# Patient Record
Sex: Female | Born: 1955
Health system: Southern US, Community
[De-identification: ages and names within clinical notes are randomized; demographics above are authoritative.]

## PROBLEM LIST (undated history)

## (undated) DIAGNOSIS — Z87442 Personal history of urinary calculi: Secondary | ICD-10-CM

## (undated) DIAGNOSIS — I10 Essential (primary) hypertension: Secondary | ICD-10-CM

## (undated) DIAGNOSIS — R768 Other specified abnormal immunological findings in serum: Secondary | ICD-10-CM

## (undated) DIAGNOSIS — D61818 Other pancytopenia: Principal | ICD-10-CM

## (undated) DIAGNOSIS — J45909 Unspecified asthma, uncomplicated: Secondary | ICD-10-CM

## (undated) DIAGNOSIS — M545 Low back pain, unspecified: Secondary | ICD-10-CM

## (undated) DIAGNOSIS — IMO0001 Reserved for inherently not codable concepts without codable children: Secondary | ICD-10-CM

## (undated) DIAGNOSIS — K219 Gastro-esophageal reflux disease without esophagitis: Secondary | ICD-10-CM

## (undated) DIAGNOSIS — I8500 Esophageal varices without bleeding: Secondary | ICD-10-CM

## (undated) DIAGNOSIS — K7460 Unspecified cirrhosis of liver: Secondary | ICD-10-CM

## (undated) DIAGNOSIS — Z5189 Encounter for other specified aftercare: Secondary | ICD-10-CM

## (undated) DIAGNOSIS — R0600 Dyspnea, unspecified: Secondary | ICD-10-CM

## (undated) DIAGNOSIS — D759 Disease of blood and blood-forming organs, unspecified: Secondary | ICD-10-CM

## (undated) DIAGNOSIS — K8020 Calculus of gallbladder without cholecystitis without obstruction: Secondary | ICD-10-CM

## (undated) DIAGNOSIS — M51369 Other intervertebral disc degeneration, lumbar region without mention of lumbar back pain or lower extremity pain: Secondary | ICD-10-CM

## (undated) DIAGNOSIS — F419 Anxiety disorder, unspecified: Secondary | ICD-10-CM

## (undated) DIAGNOSIS — I509 Heart failure, unspecified: Secondary | ICD-10-CM

## (undated) DIAGNOSIS — IMO0002 Reserved for concepts with insufficient information to code with codable children: Secondary | ICD-10-CM

## (undated) DIAGNOSIS — N189 Chronic kidney disease, unspecified: Secondary | ICD-10-CM

## (undated) DIAGNOSIS — F1021 Alcohol dependence, in remission: Secondary | ICD-10-CM

## (undated) DIAGNOSIS — M79605 Pain in left leg: Secondary | ICD-10-CM

## (undated) DIAGNOSIS — F329 Major depressive disorder, single episode, unspecified: Secondary | ICD-10-CM

## (undated) DIAGNOSIS — J449 Chronic obstructive pulmonary disease, unspecified: Secondary | ICD-10-CM

## (undated) DIAGNOSIS — B1920 Unspecified viral hepatitis C without hepatic coma: Secondary | ICD-10-CM

## (undated) DIAGNOSIS — R51 Headache: Secondary | ICD-10-CM

## (undated) DIAGNOSIS — E538 Deficiency of other specified B group vitamins: Principal | ICD-10-CM

## (undated) DIAGNOSIS — F32A Depression, unspecified: Secondary | ICD-10-CM

## (undated) DIAGNOSIS — M1990 Unspecified osteoarthritis, unspecified site: Secondary | ICD-10-CM

## (undated) DIAGNOSIS — M5136 Other intervertebral disc degeneration, lumbar region: Secondary | ICD-10-CM

## (undated) DIAGNOSIS — R519 Headache, unspecified: Secondary | ICD-10-CM

## (undated) HISTORY — DX: Low back pain, unspecified: M54.50

## (undated) HISTORY — DX: Major depressive disorder, single episode, unspecified: F32.9

## (undated) HISTORY — DX: Essential (primary) hypertension: I10

## (undated) HISTORY — DX: Unspecified viral hepatitis C without hepatic coma: B19.20

## (undated) HISTORY — DX: Depression, unspecified: F32.A

## (undated) HISTORY — DX: Unspecified cirrhosis of liver: K74.60

## (undated) HISTORY — DX: Esophageal varices without bleeding: I85.00

## (undated) HISTORY — DX: Pain in left leg: M79.605

## (undated) HISTORY — DX: Gastro-esophageal reflux disease without esophagitis: K21.9

## (undated) HISTORY — PX: APPENDECTOMY: SHX54

## (undated) HISTORY — DX: Other specified abnormal immunological findings in serum: R76.8

## (undated) HISTORY — DX: Low back pain: M54.5

## (undated) HISTORY — DX: Deficiency of other specified B group vitamins: E53.8

## (undated) HISTORY — PX: CHOLECYSTECTOMY: SHX55

## (undated) HISTORY — DX: Other pancytopenia: D61.818

## (undated) HISTORY — DX: Reserved for concepts with insufficient information to code with codable children: IMO0002

---

## 2011-11-11 ENCOUNTER — Encounter (HOSPITAL_COMMUNITY): Payer: Medicaid Other | Attending: Oncology | Admitting: Oncology

## 2011-11-11 ENCOUNTER — Encounter (HOSPITAL_COMMUNITY): Payer: Self-pay | Admitting: Oncology

## 2011-11-11 DIAGNOSIS — D61818 Other pancytopenia: Secondary | ICD-10-CM | POA: Insufficient documentation

## 2011-11-11 DIAGNOSIS — L280 Lichen simplex chronicus: Secondary | ICD-10-CM | POA: Insufficient documentation

## 2011-11-11 LAB — RETICULOCYTES
RBC.: 3.21 MIL/uL — ABNORMAL LOW (ref 3.87–5.11)
Retic Count, Absolute: 105.9 10*3/uL (ref 19.0–186.0)
Retic Ct Pct: 3.3 % — ABNORMAL HIGH (ref 0.4–3.1)

## 2011-11-11 LAB — CBC
Hemoglobin: 11.2 g/dL — ABNORMAL LOW (ref 12.0–15.0)
MCHC: 32.6 g/dL (ref 30.0–36.0)
Platelets: 52 10*3/uL — ABNORMAL LOW (ref 150–400)
RBC: 3.21 MIL/uL — ABNORMAL LOW (ref 3.87–5.11)

## 2011-11-11 LAB — DIFFERENTIAL
Basophils Relative: 2 % — ABNORMAL HIGH (ref 0–1)
Monocytes Relative: 10 % (ref 3–12)
Neutro Abs: 1.4 10*3/uL — ABNORMAL LOW (ref 1.7–7.7)
Neutrophils Relative %: 58 % (ref 43–77)

## 2011-11-11 LAB — COMPREHENSIVE METABOLIC PANEL
AST: 86 U/L — ABNORMAL HIGH (ref 0–37)
Albumin: 3.2 g/dL — ABNORMAL LOW (ref 3.5–5.2)
BUN: 6 mg/dL (ref 6–23)
Calcium: 9.5 mg/dL (ref 8.4–10.5)
Chloride: 96 mEq/L (ref 96–112)
Creatinine, Ser: 0.8 mg/dL (ref 0.50–1.10)
Total Bilirubin: 3.1 mg/dL — ABNORMAL HIGH (ref 0.3–1.2)
Total Protein: 8.7 g/dL — ABNORMAL HIGH (ref 6.0–8.3)

## 2011-11-11 NOTE — Progress Notes (Signed)
This office note has been dictated.

## 2011-11-11 NOTE — Progress Notes (Signed)
CC:   Gabriella Face Muse, PA  DIAGNOSES: 1. Pancytopenia with an elevation in mean corpuscular volume to 104.8     as of 10/10/2011.  White count was 2700 on that date.  Platelets     were 2000 on that date.  Differential showed decrease in     granulocytes. 2. History of excessive alcohol use, quitting a year ago, then     resuming use of a pint a day 3 days a week for a month, quitting a     month ago when she developed abdominal discomfort again. 3. History of appendectomy. 4. History cholecystectomy. 5. Poor dental hygiene. 6. History of hepatitis B and C, she states.  She was told that, she     states, at [facility] in 2010. 7. History of smoking half a pack of cigarettes a day since age 60. 8. History of hypertension.  HISTORY OF PRESENT ILLNESS:  This is [name] who is referred through the health department with pancytopenia.  Her labs are as mentioned above.  She also had on that same date an elevation in total bilirubin to 1.9, an alkaline phosphatase of 170 and an SGPT of 86, certainly indicative of possible ongoing use of alcohol.  She was not a drug user other than for alcohol, she states.  She never used IV drugs.  She is not aware of her husband ever having hepatitis B or C.  She has been married for 6 years and separated for 1, and they were together for 17 years, she states.  She had another relationship in the past where she conceived a son.  He is now in his 30s in good health.  She also had 1 other pregnancy, but the baby died at 6 months during pregnancy.  A C-section had to be performed, she states.  She grew up in a family of alcoholics, both mother, father, sisters and 2 brothers.  Her mother just died the other day of complications of what sounds like diabetes.  Father was killed in a car wreck many years ago. Presently, she is living by herself, she states.  She still smokes.  She has not drunk for a month, and her  abdomen has gotten somewhat better.  MEDICATIONS:  Multivitamin, Norvasc 10 mg a day.  She takes the Zantac 200 mg a day at bedtime and tramadol p.r.n. every 4 hours for pain.  REVIEW OF SYSTEMS:  Presently she states she is not in any discomfort.  She does not remember being told that she had hepatitis before 2010, but she states that she was jaundiced on 1 occasion in the past.  She cannot remember when that was.  Oncologic review of systems and hematological review of systems revealed no sore tongue, no trouble swallowing.  She does have sore gums.  No fevers, chills or night sweats.  She has lots of itching on her skin, occasionally a rare headache.  Sometimes she feels her heart beating a little fast.  She does not have any pain in her chest consistent with cardiac disease though.  As far as her strength and performance status, she is probably not able to work full time, she states, because of weakness and fatigue.  She does states that she has she has tingling sometimes in both feet.  PHYSICAL EXAMINATION:  General:  She is alert. She is oriented. She is [place] American with what appears to be some native American features to me.  She is 5  feet, 6-1/4 inches tall; weight is 194 pounds.  BMI is 31.  Vital Signs:  Blood pressure 157/88 right arm, sitting position, pulse 88-92 and regular, respiratory rate 18 and unlabored.  She is afebrile.  Denies any pain presently.  Skin:  Skin is covered with eroded rounded hypertrophic areas on the skin of the arms, chest, breasts, abdomen, back, and legs all consistent with neurodermatitis. Neck:  She has no lymphadenopathy, no thyromegaly.  She does have a sebaceous cyst at the base of the neck, more towards the left of the midline.  HEENT:  She has very poor dental hygiene.  Tongue, however, is unremarkable.  Throat is clear.  Pupils appear to be equal, round, and reactive to light.  She has dirty sclerae not definitively jaundiced  at this time.  Heart:  She has a heart which shows a regular rhythm and rate without obvious murmur, rub, or gallop.  Abdomen:  There is fullness in the left upper quadrant of the abdomen.  No distinct hepatomegaly, but I thought I felt a spleen tip at 1 time in the left upper quadrant supine position.  I could not duplicate that in the right lateral decubitus position.  There is fullness there, however, in both positions.  Bowel sounds are diminished but present.  Extremities: Nails unremarkable.  Pulses in her feet are 1+ to 2+.  She is right handed.  She has no clubbing or cyanosis.  Her nails are pale in appearance. Neurologic:  She is alert.  She is oriented.  She answers all questions appropriately.  Follows all commands appropriately.  PLAN:  She needs workup including a CT scan to look at her liver and spleen size.  She needs extensive blood work, and then we need to see her back in a month.  I think we need to prove or disprove whether she has had hepatitis B or C, since those certainly could cause pancytopenia potentially.  I think we need to look a B12, folic acid levels, iron studies, etc.  She absolutely needs to stop drinking.  I have recommended that she go back to AA.  She went there once years ago and has not been back, but she absolutely needs to quit drinking.  The neurodermatitis is from anxiety and possibly some depression in this woman.  We will get the lab results back and see her in a month.    ______________________________ Ladona Horns. Mariel Sleet, MD ESN/MEDQ  D:  11/11/2011  T:  11/11/2011  Job:  [id number]

## 2011-11-11 NOTE — Patient Instructions (Signed)
[facility]  Discharge Instructions  RECOMMENDATIONS MADE BY THE CONSULTANT AND ANY TEST RESULTS WILL BE SENT TO YOUR REFERRING DOCTOR.   EXAM FINDINGS BY MD TODAY AND SIGNS AND SYMPTOMS TO REPORT TO CLINIC OR PRIMARY MD:   CT scan of abdomen next week  Labs today. We will call you with results. However, if you don't hear from [place] by 4:00 on 11/21 call us at [phone] and ask to speak to a nurse regarding your lab results  Return to Dr. Mariel Sleet in 1 month.  Dr. Mariel Sleet wants you to reconsider going back to AA. Stop drinking!  I acknowledge that I have been informed and understand all the instructions given to me and received a copy. I do not have any more questions at this time, but understand that I may call the Specialty Clinic at [facility] at [phone] during business hours should I have any further questions or need assistance in obtaining follow-up care.    __________________________________________  _____________  __________ Signature of Patient or Authorized Representative            Date                   Time    __________________________________________ Nurse's Signature

## 2011-11-12 ENCOUNTER — Telehealth (HOSPITAL_COMMUNITY): Payer: Self-pay | Admitting: *Deleted

## 2011-11-12 LAB — IRON AND TIBC
Iron: 268 ug/dL — ABNORMAL HIGH (ref 42–135)
UIBC: <55 ug/dL — ABNORMAL LOW (ref 125–400)

## 2011-11-12 LAB — VITAMIN B12: Vitamin B-12: 1225 pg/mL — ABNORMAL HIGH (ref 211–911)

## 2011-11-12 LAB — FOLATE: Folate: 4.9 ng/mL

## 2011-11-12 LAB — HEPATITIS PANEL, ACUTE: Hepatitis B Surface Ag: NEGATIVE

## 2011-11-12 NOTE — Telephone Encounter (Signed)
Spoke with pt as below, verbalizes understanding. RX called to [facility].

## 2011-11-12 NOTE — Telephone Encounter (Signed)
Message copied by Dennie Maizes on Wed Nov 12, 2011 11:23 AM ------      Message from: Mariel Sleet, ERIC S      Created: Wed Nov 12, 2011  9:33 AM       Call in folic acid 1 mg a day #30 refill x 3      Start asap and bring back in 4 weeks for cbc!

## 2011-11-17 ENCOUNTER — Ambulatory Visit (HOSPITAL_COMMUNITY)
Admission: RE | Admit: 2011-11-17 | Discharge: 2011-11-17 | Disposition: A | Discharge status: Home / Self Care | Payer: Medicaid Other | Source: Ambulatory Visit | Attending: Oncology | Admitting: Oncology

## 2011-11-17 DIAGNOSIS — K7460 Unspecified cirrhosis of liver: Secondary | ICD-10-CM | POA: Insufficient documentation

## 2011-11-17 DIAGNOSIS — D61818 Other pancytopenia: Secondary | ICD-10-CM | POA: Insufficient documentation

## 2011-11-17 DIAGNOSIS — R935 Abnormal findings on diagnostic imaging of other abdominal regions, including retroperitoneum: Secondary | ICD-10-CM | POA: Insufficient documentation

## 2011-11-17 DIAGNOSIS — R109 Unspecified abdominal pain: Secondary | ICD-10-CM | POA: Insufficient documentation

## 2011-11-18 ENCOUNTER — Other Ambulatory Visit (HOSPITAL_COMMUNITY): Payer: Self-pay | Admitting: Oncology

## 2011-11-18 NOTE — Progress Notes (Signed)
Patient called re labs to be drawn on December 19th @ 0900 - patient verbalized understanding of her new lab appointment and will come in to the clinic for same.

## 2011-11-19 ENCOUNTER — Telehealth (HOSPITAL_COMMUNITY): Payer: Self-pay

## 2011-11-19 NOTE — Telephone Encounter (Signed)
Patient requesting call regarding CT Scan results.

## 2011-11-20 ENCOUNTER — Telehealth (HOSPITAL_COMMUNITY): Payer: Self-pay | Admitting: *Deleted

## 2011-11-20 ENCOUNTER — Ambulatory Visit: Payer: Medicaid Other | Admitting: Gastroenterology

## 2011-11-20 ENCOUNTER — Other Ambulatory Visit (HOSPITAL_COMMUNITY): Payer: Self-pay | Admitting: *Deleted

## 2011-11-20 NOTE — Telephone Encounter (Signed)
Barbera Johnson notified of scheduled appt with Dr. Luvenia Starch office on Wednesday Dec 5 @ 3pm. Chart faxed to Dr. Jena Gauss code on fax said accepted.

## 2011-11-25 ENCOUNTER — Encounter: Payer: Self-pay | Admitting: Gastroenterology

## 2011-11-26 ENCOUNTER — Encounter: Payer: Self-pay | Admitting: Gastroenterology

## 2011-11-26 ENCOUNTER — Ambulatory Visit (INDEPENDENT_AMBULATORY_CARE_PROVIDER_SITE_OTHER): Payer: Medicaid Other | Admitting: Gastroenterology

## 2011-11-26 VITALS — BP 138/80 | HR 92 | Temp 98.0°F | Ht 65.0 in | Wt 190.4 lb

## 2011-11-26 DIAGNOSIS — K7460 Unspecified cirrhosis of liver: Secondary | ICD-10-CM

## 2011-11-26 DIAGNOSIS — R109 Unspecified abdominal pain: Secondary | ICD-10-CM

## 2011-11-26 DIAGNOSIS — Z1211 Encounter for screening for malignant neoplasm of colon: Secondary | ICD-10-CM

## 2011-11-26 MED ORDER — OMEPRAZOLE 20 MG PO CPDR: 20.0000 mg | DELAYED_RELEASE_CAPSULE | Freq: Every day | ORAL | Status: DC

## 2011-11-26 NOTE — Progress Notes (Signed)
Referring Provider: Tylene Fantasia., PA Primary Care Physician:  Tylene Fantasia., PA, PA-C Primary Gastroenterologist:  Dr. Jena Gauss  Chief Complaint  Patient presents with  . Cirrhosis    HPI:   Gabriella Leon is a 55 year old female who presents today at the request of Dr. Mariel Sleet, secondary to cirrhosis. She has had labs to include acute hepatitis panel with reactive Hep C. Platelets 52, Hgb 11.2. Anemia panel with elevated iron and ferritin. LFTs outlined below. Recent CT with cirrhosis, borderline splenomegaly, esophageal varices. Apparently, she was hospitalized at [facility] in 2010. Dr. Karilyn Cota actually was consulted to see her. It is unclear if she underwent an outpatient EGD at time of this visit. However, labs at that time included + H.pylori serology. She was treated. At that time she was dealing with epigastric pain as well. Presents today with epigastric and RUQ pain, intermittent, associated with nausea. No vomiting. + lack of appetite, no wt loss. +pruritis, +reflux, on Zantac. No PPI currently. Has noted melena and hematochezia. Ibuprofen sparingly when has toothaches. No aspirin powders. No prior colonoscopy. Denies constipation or diarrhea.   Denies IV drug abuse in past or tattoos. Blood transfusion in 1991.   Hx of heavy alcohol abuse, sober for 2 years, then relapsed about a month ago after Mom's passing. Drinks about a pint of gin a day. Unable to attend AA due to transportation. Not interested in cessation currently. Has been heavy drinker most of adult life.   Past Medical History  Diagnosis Date  . Diabetes mellitus   . Hypertension   . Cirrhosis   . Hepatitis B antibody positive   . Hepatitis C   . Ulcer     ?  [name] Acid reflux disease with ulcer   . Depression   . Helicobacter pylori ab+     July 2010, s/p treatment    Past Surgical History  Procedure Date  . Cholecystectomy   . Appendectomy   . Cesarean section     Current Outpatient Prescriptions    Medication Sig Dispense Refill  . amLODipine (NORVASC) 10 MG tablet Take 10 mg by mouth daily.        . Multiple Vitamin (MULTIVITAMIN) capsule Take 2 capsules by mouth daily. Proactiv anti-acne complex       . traMADol (ULTRAM) 50 MG tablet Take 50 mg by mouth every 4 (four) hours as needed. Maximum dose= 8 tablets per day       . omeprazole (PRILOSEC) 20 MG capsule Take 1 capsule (20 mg total) by mouth daily. 30 min before meals  30 capsule  1    Allergies as of 11/26/2011  . (No Known Allergies)    Family History  Problem Relation Age of Onset  . Diabetes Mother   . Cancer Paternal Uncle     passed away age 56    History   Social History  . Marital Status: Legally Separated    Spouse Name: N/A    Number of Children: N/A  . Years of Education: N/A   Occupational History  . Not on file.   Social History Main Topics  . Smoking status: Current Everyday Smoker -- 0.5 packs/day for 20 years    Types: Cigarettes  . Smokeless tobacco: Never Used  . Alcohol Use: 3.0 oz/week    5 Shots of liquor per week     pint of gin every day  . Drug Use: No  . Sexually Active: Yes   Other Topics Concern  . Not on  file   Social History Narrative  . No narrative on file    Review of Systems: Gen: Denies any fever, chills, loss of appetite, fatigue, weight loss. CV: Denies chest pain, heart palpitations, syncope, peripheral edema. Resp: Denies shortness of breath with rest, cough, wheezing GI: SEE HPI GU : Denies urinary burning, urinary frequency, urinary incontinence.  MS: Denies joint pain, muscle weakness, cramps, limited movement Derm: Denies rash, itching, dry skin Psych: Denies depression, anxiety, confusion or memory loss  Heme: Denies bruising, bleeding, and enlarged lymph nodes.  Physical Exam: BP 138/80  Pulse 92  Temp(Src) 98 F (36.7 C) (Temporal)  Ht 5\' 5"  (1.651 m)  Wt 190 lb 6.4 oz (86.365 kg)  BMI 31.68 kg/m2 General:   Alert and oriented. Well-developed,  well-nourished, pleasant and cooperative. Head:  Normocephalic and atraumatic. Eyes:  Conjunctiva pink, sclera clear, mild icterus? Ears:  Normal auditory acuity. Nose:  No deformity, discharge,  or lesions. Mouth:  No deformity or lesions, mucosa pink and moist.  Neck:  Supple, without mass or thyromegaly. Lungs:  Clear to auscultation bilaterally, without wheezing, rales, or rhonchi.  Heart:  S1, S2 present without murmurs noted.  Abdomen:  +BS, soft, TTP epigastric region. Non-distended. Without mass. No rebound or guarding. No hernias noted. Rectal:  Deferred  Msk:  Symmetrical without gross deformities. Normal posture. Extremities:  Without clubbing or edema. Neurologic:  Alert and  oriented x4;  grossly normal neurologically. Skin:  Intact, warm and dry without significant lesions or rashes Cervical Nodes:  No significant cervical adenopathy. Psych:  Alert and cooperative. Normal mood and affect.

## 2011-11-26 NOTE — Patient Instructions (Signed)
Please have labs completed. We will call you with these results.  We have set you up for a colonoscopy and possibly endoscopy with Dr. Jena Gauss in the near future.  Please start taking Prilosec daily, 30 minutes before breakfast. Stop Zantac. This has been sent to your pharmacy, and we have provided samples.  It is very important that you get help to stop drinking. This is very damaging to your liver and could cause significant problems.  Please follow a low salt diet. We have given you a handout about this.  We will see you back in 3 months regardless.

## 2011-11-26 NOTE — Assessment & Plan Note (Addendum)
55 year old female with cirrhosis, likely r/t Hep C and compounded by chronic alcohol abuse. Unclear if any prior EGDs performed, pt is unsure. Esophageal varices noted on recent CT. Most recent Platelets 52. Needs to be enrolled in hepatoma screening, obtain Hep A and B vaccines if necessary, standard labs, and EGD needed for esophageal varices. Cirrhosis handout provided to pt. Alcohol cessation strongly encouraged, recommended. Discussed at length ramifications of continuing to drink, which results in poor prognosis for pt.  CBC, Hep C Quant PCR, PT/INR, AFP, HFP, assess immunity to Hep A and B Find out if prior EGD with Dr. Karilyn Cota as outpatient in 2010, obtain results (UPDATE: NONE PERFORMED PER [facility]) Plan on EGD in near future in the OR with Propofol secondary to chronic ETOH abuse (will need colonoscopy at same time)

## 2011-11-27 ENCOUNTER — Encounter: Payer: Self-pay | Admitting: Gastroenterology

## 2011-11-27 NOTE — Progress Notes (Signed)
Debbie at [facility] said no EGD from [facility]

## 2011-11-27 NOTE — Progress Notes (Signed)
Cc to [facility]. Health Dept

## 2011-11-27 NOTE — Assessment & Plan Note (Signed)
Epigastric and RUQ pain in the setting of cirrhosis, heavy ETOH abuse, hx of + H.pylori serologies s/p treatment in 2010. + reflux, not on PPI, just Zantac. No wt loss or lack of appetite. Possible melena reported by patient. Obtaining updated labs to include HFP, CBC. Needs EGD in OR as outlined under "cirrhosis". Prilosec 20 mg samples provided and rx sent to pharmacy.

## 2011-11-27 NOTE — Progress Notes (Unsigned)
I am unsure if pt had an EGD or not by Rehman in 2010 at [facility]. Could we find out? THanks! I

## 2011-11-27 NOTE — Assessment & Plan Note (Signed)
No prior colonoscopy, somewhat overdue. Intermittent hematochezia likely r/t benign anorectal source. Will proceed with colonoscopy in the OR with Propofol at time of EGD (hx of ETOH abuse).   Proceed with TCS with Dr. Jena Gauss in near future: the risks, benefits, and alternatives have been discussed with the patient in detail. The patient states understanding and desires to proceed.

## 2011-12-10 ENCOUNTER — Other Ambulatory Visit (HOSPITAL_COMMUNITY): Payer: Medicaid Other

## 2011-12-10 ENCOUNTER — Encounter (HOSPITAL_COMMUNITY): Payer: Self-pay | Admitting: Pharmacy Technician

## 2011-12-11 ENCOUNTER — Encounter (HOSPITAL_COMMUNITY): Payer: Self-pay

## 2011-12-11 ENCOUNTER — Other Ambulatory Visit: Payer: Self-pay

## 2011-12-11 ENCOUNTER — Encounter (HOSPITAL_COMMUNITY)
Admission: RE | Admit: 2011-12-11 | Discharge: 2011-12-11 | Disposition: A | Discharge status: Home / Self Care | Payer: Medicaid Other | Source: Ambulatory Visit | Attending: Internal Medicine | Admitting: Internal Medicine

## 2011-12-11 HISTORY — DX: Gastro-esophageal reflux disease without esophagitis: K21.9

## 2011-12-11 LAB — BASIC METABOLIC PANEL
Calcium: 9.1 mg/dL (ref 8.4–10.5)
Chloride: 102 mEq/L (ref 96–112)
Creatinine, Ser: 0.91 mg/dL (ref 0.50–1.10)
GFR calc Af Amer: 82 mL/min — ABNORMAL LOW (ref >90)
Sodium: 136 mEq/L (ref 135–145)

## 2011-12-11 LAB — HEMOGLOBIN AND HEMATOCRIT, BLOOD
HCT: 31.4 % — ABNORMAL LOW (ref 36.0–46.0)
Hemoglobin: 10.7 g/dL — ABNORMAL LOW (ref 12.0–15.0)

## 2011-12-11 NOTE — Patient Instructions (Addendum)
20 Mackena Johnson  12/11/2011   Your procedure is scheduled on:  12/18/2011  Report to [facility] at  700  AM.  Call this number if you have problems the morning of surgery: [phone]   Remember:   Do not eat food:After Midnight.  May have clear liquids:until Midnight .  Clear liquids include soda, tea, black coffee, apple or grape juice, broth.  Take these medicines the morning of surgery with A SIP OF WATER:  Prilosec,flexaril,lisinopril   Do not wear jewelry, make-up or nail polish.  Do not wear lotions, powders, or perfumes. You may wear deodorant.  Do not shave 48 hours prior to surgery.  Do not bring valuables to the hospital.  Contacts, dentures or bridgework may not be worn into surgery.  Leave suitcase in the car. After surgery it may be brought to your room.  For patients admitted to the hospital, checkout time is 11:00 AM the day of discharge.   Patients discharged the day of surgery will not be allowed to drive home.  Name and phone number of your driver: family  Special Instructions: N/A   Please read over the following fact sheets that you were given: Pain Booklet, Surgical Site Infection Prevention, Anesthesia Post-op Instructions and Care and Recovery After Surgery Esophagogastroduodenoscopy This is an endoscopic procedure (a procedure that uses a device like a flexible telescope) that allows your caregiver to view the upper stomach and small bowel. This test allows your caregiver to look at the esophagus. The esophagus carries food from your mouth to your stomach. They can also look at your duodenum. This is the first part of the small intestine that attaches to the stomach. This test is used to detect problems in the bowel such as ulcers and inflammation. PREPARATION FOR TEST Nothing to eat after midnight the day before the test. NORMAL FINDINGS Normal esophagus, stomach, and duodenum. Ranges for normal findings may vary among different laboratories and hospitals. You  should always check with your doctor after having lab work or other tests done to discuss the meaning of your test results and whether your values are considered within normal limits. MEANING OF TEST  Your caregiver will go over the test results with you and discuss the importance and meaning of your results, as well as treatment options and the need for additional tests if necessary. OBTAINING THE TEST RESULTS It is your responsibility to obtain your test results. Ask the lab or department performing the test when and how you will get your results. Document Released: 04/10/2005 Document Revised: 08/20/2011 Document Reviewed: 11/17/2008 [facility] Patient Information 2012 [place], [place].Colonoscopy A colonoscopy is an exam to evaluate your entire colon. In this exam, your colon is cleansed. A long fiberoptic tube is inserted through your rectum and into your colon. The fiberoptic scope (endoscope) is a long bundle of enclosed and very flexible fibers. These fibers transmit light to the area examined and send images from that area to your caregiver. Discomfort is usually minimal. You may be given a drug to help you sleep (sedative) during or prior to the procedure. This exam helps to detect lumps (tumors), polyps, inflammation, and areas of bleeding. Your caregiver may also take a small piece of tissue (biopsy) that will be examined under a microscope. LET YOUR CAREGIVER KNOW ABOUT:   Allergies to food or medicine.   Medicines taken, including vitamins, herbs, eyedrops, over-the-counter medicines, and creams.   Use of steroids (by mouth or creams).   Previous problems with anesthetics or  numbing medicines.   History of bleeding problems or blood clots.   Previous surgery.   Other health problems, including diabetes and kidney problems.   Possibility of pregnancy, if this applies.  BEFORE THE PROCEDURE   A clear liquid diet may be required for 2 days before the exam.   Ask your caregiver  about changing or stopping your regular medications.   Liquid injections (enemas) or laxatives may be required.   A large amount of electrolyte solution may be given to you to drink over a short period of time. This solution is used to clean out your colon.   You should be present 60 minutes prior to your procedure or as directed by your caregiver.  AFTER THE PROCEDURE   If you received a sedative or pain relieving medication, you will need to arrange for someone to drive you home.   Occasionally, there is a little blood passed with the first bowel movement. Do not be concerned.  FINDING OUT THE RESULTS OF YOUR TEST Not all test results are available during your visit. If your test results are not back during the visit, make an appointment with your caregiver to find out the results. Do not assume everything is normal if you have not heard from your caregiver or the medical facility. It is important for you to follow up on all of your test results. HOME CARE INSTRUCTIONS   It is not unusual to pass moderate amounts of gas and experience mild abdominal cramping following the procedure. This is due to air being used to inflate your colon during the exam. Walking or a warm pack on your belly (abdomen) may help.   You may resume all normal meals and activities after sedatives and medicines have worn off.   Only take over-the-counter or prescription medicines for pain, discomfort, or fever as directed by your caregiver. Do not use aspirin or blood thinners if a biopsy was taken. Consult your caregiver for medicine usage if biopsies were taken.  SEEK IMMEDIATE MEDICAL CARE IF:   You have a fever.   You pass large blood clots or fill a toilet with blood following the procedure. This may also occur 10 to 14 days following the procedure. This is more likely if a biopsy was taken.   You develop abdominal pain that keeps getting worse and cannot be relieved with medicine.  Document Released:  12/05/2000 Document Revised: 08/20/2011 Document Reviewed: 07/20/2008 [facility] Patient Information 2012 [place], [place].PATIENT INSTRUCTIONS POST-ANESTHESIA  IMMEDIATELY FOLLOWING SURGERY:  Do not drive or operate machinery for the first twenty four hours after surgery.  Do not make any important decisions for twenty four hours after surgery or while taking narcotic pain medications or sedatives.  If you develop intractable nausea and vomiting or a severe headache please notify your doctor immediately.  FOLLOW-UP:  Please make an appointment with your surgeon as instructed. You do not need to follow up with anesthesia unless specifically instructed to do so.  WOUND CARE INSTRUCTIONS (if applicable):  Keep a dry clean dressing on the anesthesia/puncture wound site if there is drainage.  Once the wound has quit draining you may leave it open to air.  Generally you should leave the bandage intact for twenty four hours unless there is drainage.  If the epidural site drains for more than 36-48 hours please call the anesthesia department.  QUESTIONS?:  Please feel free to call your physician or the hospital operator if you have any questions, and they will be happy  to assist you.     [facility] Anesthesia Department [address] [place] [phone]

## 2011-12-18 ENCOUNTER — Ambulatory Visit (HOSPITAL_COMMUNITY)
Admission: RE | Admit: 2011-12-18 | Discharge: 2011-12-18 | Disposition: A | Discharge status: Home / Self Care | Payer: Medicaid Other | Source: Ambulatory Visit | Attending: Internal Medicine | Admitting: Internal Medicine

## 2011-12-18 ENCOUNTER — Encounter (HOSPITAL_COMMUNITY): Payer: Self-pay | Admitting: Anesthesiology

## 2011-12-18 ENCOUNTER — Encounter (HOSPITAL_COMMUNITY): Payer: Self-pay | Admitting: *Deleted

## 2011-12-18 ENCOUNTER — Encounter (HOSPITAL_COMMUNITY)
Admission: RE | Disposition: A | Discharge status: Home / Self Care | Payer: Self-pay | Source: Ambulatory Visit | Attending: Internal Medicine

## 2011-12-18 ENCOUNTER — Other Ambulatory Visit: Payer: Self-pay | Admitting: Internal Medicine

## 2011-12-18 ENCOUNTER — Ambulatory Visit (HOSPITAL_COMMUNITY): Payer: Medicaid Other | Admitting: Anesthesiology

## 2011-12-18 DIAGNOSIS — Z79899 Other long term (current) drug therapy: Secondary | ICD-10-CM | POA: Insufficient documentation

## 2011-12-18 DIAGNOSIS — K2940 Chronic atrophic gastritis without bleeding: Secondary | ICD-10-CM | POA: Insufficient documentation

## 2011-12-18 DIAGNOSIS — K228 Other specified diseases of esophagus: Secondary | ICD-10-CM

## 2011-12-18 DIAGNOSIS — K319 Disease of stomach and duodenum, unspecified: Secondary | ICD-10-CM | POA: Insufficient documentation

## 2011-12-18 DIAGNOSIS — I8510 Secondary esophageal varices without bleeding: Secondary | ICD-10-CM | POA: Insufficient documentation

## 2011-12-18 DIAGNOSIS — Z01810 Encounter for preprocedural cardiovascular examination: Secondary | ICD-10-CM | POA: Insufficient documentation

## 2011-12-18 DIAGNOSIS — K2289 Other specified disease of esophagus: Secondary | ICD-10-CM

## 2011-12-18 DIAGNOSIS — I10 Essential (primary) hypertension: Secondary | ICD-10-CM | POA: Insufficient documentation

## 2011-12-18 DIAGNOSIS — K921 Melena: Secondary | ICD-10-CM

## 2011-12-18 DIAGNOSIS — R1011 Right upper quadrant pain: Secondary | ICD-10-CM | POA: Insufficient documentation

## 2011-12-18 DIAGNOSIS — R1013 Epigastric pain: Secondary | ICD-10-CM | POA: Insufficient documentation

## 2011-12-18 DIAGNOSIS — I8500 Esophageal varices without bleeding: Secondary | ICD-10-CM

## 2011-12-18 DIAGNOSIS — K7460 Unspecified cirrhosis of liver: Secondary | ICD-10-CM | POA: Insufficient documentation

## 2011-12-18 DIAGNOSIS — E119 Type 2 diabetes mellitus without complications: Secondary | ICD-10-CM | POA: Insufficient documentation

## 2011-12-18 DIAGNOSIS — K649 Unspecified hemorrhoids: Secondary | ICD-10-CM

## 2011-12-18 DIAGNOSIS — Z01812 Encounter for preprocedural laboratory examination: Secondary | ICD-10-CM | POA: Insufficient documentation

## 2011-12-18 HISTORY — PX: BIOPSY: SHX5522

## 2011-12-18 HISTORY — PX: COLONOSCOPY: SHX174

## 2011-12-18 HISTORY — PX: ESOPHAGOGASTRODUODENOSCOPY: SHX5428

## 2011-12-18 LAB — GLUCOSE, CAPILLARY: Glucose-Capillary: 164 mg/dL — ABNORMAL HIGH (ref 70–99)

## 2011-12-18 SURGERY — COLONOSCOPY WITH PROPOFOL
Anesthesia: Monitor Anesthesia Care

## 2011-12-18 MED ORDER — FENTANYL CITRATE 0.05 MG/ML IJ SOLN
INTRAMUSCULAR | Status: DC | PRN
  Administered 2011-12-18: 100 ug via INTRAVENOUS

## 2011-12-18 MED ORDER — BUTAMBEN-TETRACAINE-BENZOCAINE 2-2-14 % EX AERO
1.0000 | INHALATION_SPRAY | Freq: Once | CUTANEOUS | Status: AC
  Administered 2011-12-18: 1 via TOPICAL
  Filled 2011-12-18: qty 56

## 2011-12-18 MED ORDER — ONDANSETRON HCL 4 MG/2ML IJ SOLN
4.0000 mg | Freq: Once | INTRAMUSCULAR | Status: AC
  Administered 2011-12-18: 4 mg via INTRAVENOUS

## 2011-12-18 MED ORDER — MIDAZOLAM HCL 5 MG/5ML IJ SOLN
INTRAMUSCULAR | Status: DC | PRN
  Administered 2011-12-18: 2 mg via INTRAVENOUS

## 2011-12-18 MED ORDER — MIDAZOLAM HCL 2 MG/2ML IJ SOLN
INTRAMUSCULAR | Status: AC
  Administered 2011-12-18: 2 mg via INTRAVENOUS
  Filled 2011-12-18: qty 2

## 2011-12-18 MED ORDER — PROPRANOLOL HCL 60 MG PO CP24: 60.0000 mg | ORAL_CAPSULE | Freq: Every day | ORAL | Status: DC

## 2011-12-18 MED ORDER — LABETALOL HCL 5 MG/ML IV SOLN
INTRAVENOUS | Status: AC
  Filled 2011-12-18: qty 4

## 2011-12-18 MED ORDER — LACTATED RINGERS IV SOLN
INTRAVENOUS | Status: DC
  Administered 2011-12-18: 1000 mL via INTRAVENOUS

## 2011-12-18 MED ORDER — LACTATED RINGERS IV SOLN
INTRAVENOUS | Status: DC | PRN
  Administered 2011-12-18: 07:00:00 via INTRAVENOUS

## 2011-12-18 MED ORDER — PROPOFOL 10 MG/ML IV EMUL
INTRAVENOUS | Status: DC | PRN
  Administered 2011-12-18: 100 ug/kg/min via INTRAVENOUS

## 2011-12-18 MED ORDER — SIMETHICONE 40 MG/0.6ML PO SUSP
ORAL | Status: DC | PRN
  Administered 2011-12-18: 09:00:00

## 2011-12-18 MED ORDER — LIDOCAINE HCL (PF) 1 % IJ SOLN
INTRAMUSCULAR | Status: AC
  Filled 2011-12-18: qty 5

## 2011-12-18 MED ORDER — GLYCOPYRROLATE 0.2 MG/ML IJ SOLN
INTRAMUSCULAR | Status: AC
  Filled 2011-12-18: qty 1

## 2011-12-18 MED ORDER — LABETALOL HCL 5 MG/ML IV SOLN
INTRAVENOUS | Status: DC | PRN
  Administered 2011-12-18: 5 mg via INTRAVENOUS

## 2011-12-18 MED ORDER — PROPOFOL 10 MG/ML IV EMUL
INTRAVENOUS | Status: AC
  Filled 2011-12-18: qty 20

## 2011-12-18 MED ORDER — MIDAZOLAM HCL 2 MG/2ML IJ SOLN
1.0000 mg | INTRAMUSCULAR | Status: DC | PRN
  Administered 2011-12-18: 2 mg via INTRAVENOUS

## 2011-12-18 MED ORDER — ONDANSETRON HCL 4 MG/2ML IJ SOLN
INTRAMUSCULAR | Status: AC
  Administered 2011-12-18: 4 mg via INTRAVENOUS
  Filled 2011-12-18: qty 2

## 2011-12-18 MED ORDER — ONDANSETRON HCL 4 MG/2ML IJ SOLN: 4.0000 mg | Freq: Once | INTRAMUSCULAR | Status: DC | PRN

## 2011-12-18 MED ORDER — GLYCOPYRROLATE 0.2 MG/ML IJ SOLN
0.2000 mg | Freq: Once | INTRAMUSCULAR | Status: AC
  Administered 2011-12-18: 0.2 mg via INTRAVENOUS

## 2011-12-18 MED ORDER — FENTANYL CITRATE 0.05 MG/ML IJ SOLN: 25.0000 ug | INTRAMUSCULAR | Status: DC | PRN

## 2011-12-18 MED ORDER — FENTANYL CITRATE 0.05 MG/ML IJ SOLN
INTRAMUSCULAR | Status: AC
  Filled 2011-12-18: qty 2

## 2011-12-18 MED ORDER — PROPOFOL 10 MG/ML IV BOLUS
INTRAVENOUS | Status: DC | PRN
  Administered 2011-12-18: 30 mg via INTRAVENOUS
  Administered 2011-12-18: 20 mg via INTRAVENOUS

## 2011-12-18 MED ORDER — MIDAZOLAM HCL 2 MG/2ML IJ SOLN
INTRAMUSCULAR | Status: AC
  Filled 2011-12-18: qty 2

## 2011-12-18 SURGICAL SUPPLY — 25 items
BLOCK BITE 60FR ADLT L/F BLUE (MISCELLANEOUS) ×2 IMPLANT
DEVICE CLIP HEMOSTAT 235CM (CLIP) IMPLANT
ELECT REM PT RETURN 9FT ADLT (ELECTROSURGICAL)
ELECTRODE REM PT RTRN 9FT ADLT (ELECTROSURGICAL) IMPLANT
FCP BXJMBJMB 240X2.8X (CUTTING FORCEPS)
FLOOR PAD 36X40 (MISCELLANEOUS) ×2
FORCEP RJ3 GP 1.8X160 W-NEEDLE (CUTTING FORCEPS) IMPLANT
FORCEPS BIOP RAD 4 LRG CAP 4 (CUTTING FORCEPS) ×2 IMPLANT
FORCEPS BIOP RJ4 240 W/NDL (CUTTING FORCEPS)
FORCEPS BXJMBJMB 240X2.8X (CUTTING FORCEPS) IMPLANT
INJECTOR/SNARE I SNARE (MISCELLANEOUS) IMPLANT
LUBRICANT JELLY 4.5OZ STERILE (MISCELLANEOUS) ×2 IMPLANT
MANIFOLD NEPTUNE II (INSTRUMENTS) ×2 IMPLANT
MANIFOLD NEPTUNE WASTE (CANNULA) ×2 IMPLANT
NEEDLE SCLEROTHERAPY 25GX240 (NEEDLE) ×2 IMPLANT
PAD FLOOR 36X40 (MISCELLANEOUS) ×1 IMPLANT
PROBE APC STR FIRE (PROBE) ×2 IMPLANT
PROBE INJECTION GOLD (MISCELLANEOUS) ×1
PROBE INJECTION GOLD 7FR (MISCELLANEOUS) ×1 IMPLANT
SNARE ROTATE MED OVAL 20MM (MISCELLANEOUS) ×2 IMPLANT
SYR 50ML LL SCALE MARK (SYRINGE) ×2 IMPLANT
TRAP SPECIMEN MUCOUS 40CC (MISCELLANEOUS) IMPLANT
TUBING ENDO SMARTCAP PENTAX (MISCELLANEOUS) ×2 IMPLANT
TUBING IRRIGATION ENDOGATOR (MISCELLANEOUS) ×2 IMPLANT
WATER STERILE IRR 1000ML POUR (IV SOLUTION) ×2 IMPLANT

## 2011-12-18 NOTE — Op Note (Signed)
[facility] [address] [place], [place]  [address]  COLONOSCOPY PROCEDURE REPORT  PATIENT:  Gabriella, Leon  MR#:  [id number] BIRTHDATE:  [birth year], 55 yrs. old  GENDER:  female ENDOSCOPIST:  R. Roetta Sessions, MD FACP [facility] REF. BY:          Dr. Glenford Peers PROCEDURE DATE:  12/18/2011 PROCEDURE:  colonoscopy-diagnostic  INDICATIONS:  occasional low-volume hematochezia. No prior colon Imaging  INFORMED CONSENT:  The risks, benefits, alternatives and imponderables including but not limited to bleeding, perforation as well as the possibility of a missed lesion have been reviewed. The potential for biopsy, lesion removal, etc. have also been discussed.  Questions have been answered.  All parties agreeable. Please see the history and physical in the medical record for more information.  MEDICATIONS:  deep sedation per Dr. [facility]  DESCRIPTION OF PROCEDURE:  After a digital rectal exam was performed, the  colonoscope was advanced from the anus through the rectum and colon to the area of the cecum, ileocecal valve and appendiceal orifice.  The cecum was deeply intubated.  These structures were well-seen and photographed for the record.  From the level of the cecum and ileocecal valve, the scope was slowly and cautiously withdrawn.  The mucosal surfaces were carefully surveyed utilizing scope tip deflection to facilitate fold flattening as needed.  The scope was pulled down into the rectum where a thorough examination including retroflexion was performed. <<PROCEDUREIMAGES>>  FINDINGS:  adequate preparation. Minimal anal canal hemorrhoids. From the rectal and colonic mucosa. Left-sided diverticulosis; remainder colonic and rectal mucosa appeared normal  THERAPEUTIC / DIAGNOSTIC MANEUVERS PERFORMED:  none  COMPLICATIONS:  none  CECAL WITHDRAWAL TIME:7 minutes  IMPRESSION:  Minimal anal canal hemorrhoids; friable rectal and colonic mucosa.  Left-sided diverticulosis. Suspect benign bleeding from                                the anorectum in the setting of cirrhosis and secondary thrombocytopenia  RECOMMENDATIONS:    Daily fiber supplement. Repeat colonoscopy for screening purposes in 10 years. See EGD report  ______________________________ R. Roetta Sessions, MD Caleen Essex  CC:  Glenford Peers, MD  n. eSIGNED:   R. Roetta Sessions at 12/18/2011 09:12 AM  Izetta Dakin, [phone]

## 2011-12-18 NOTE — Anesthesia Postprocedure Evaluation (Signed)
  Anesthesia Post-op Note  Patient: Gabriella Leon  Procedure(s) Performed:  COLONOSCOPY WITH PROPOFOL - procedure started @ [phone] ; entered cecum @  0847 cecal withdrawal time = 6 minutes; ESOPHAGOGASTRODUODENOSCOPY (EGD) WITH PROPOFOL - procedure ended @ 0839; BIOPSY - gastric erosions biopsies  Patient Location: PACU  Anesthesia Type: MAC  Level of Consciousness: awake, alert  and oriented  Airway and Oxygen Therapy: Patient Spontanous Breathing and Patient connected to face mask oxygen  Post-op Pain: none  Post-op Assessment: Post-op Vital signs reviewed, Patient's Cardiovascular Status Stable, Respiratory Function Stable, Patent Airway and Pain level controlled  Post-op Vital Signs: Reviewed and stable  Complications: No apparent anesthesia complications

## 2011-12-18 NOTE — Op Note (Signed)
[facility] [address] [place], [place]  [address]  ENDOSCOPY PROCEDURE REPORT  PATIENT:  Gabriella, Leon  MR#:  [id number] BIRTHDATE:  08-Jan-1956, 55 yrs. old  GENDER:  female  ENDOSCOPIST:  R. Roetta Sessions, MD FACP [facility] Referred by:          Dr. Mariel Sleet  PROCEDURE DATE:  12/18/2011 PROCEDURE:  EGD with gastric biopsy  INDICATIONS:  epigastric and right upper quadrant abdominal pain. History of cirrhosis; esophageal varices and CT. Abdominal pain now much better with the reinstitution of omeprazole 20 mg daily.   INFORMED CONSENT:   The risks, benefits, limitations, alternatives and imponderables have been discussed.  The potential for biopsy, esophogeal dilation, etc. have also been reviewed.  Questions have been answered.  All parties agreeable.  Please see the history and physical in the medical record for more information.  MEDICATIONS:      deep sedation per Dr. [facility]  DESCRIPTION OF PROCEDURE:   The  endoscope was introduced through the mouth and advanced to the second portion of the duodenum without difficulty or limitations.  The mucosal surfaces were surveyed very carefully during advancement of the scope and upon withdrawal.  Retroflexion view of the proximal stomach and esophagogastric junction was performed.  <<PROCEDUREIMAGES>>  FINDINGS:  3 columns of grade 2 esophageal varices without bleeding stigmata. Distal esophageal erosions straddling the EG junction.     no Barrett's esophagus. Small hiatal hernia. Portal gastropathy. Antral erosions.  couple of scattered erosions in the first and   second portion of the duodenum; otherwise normal.  THERAPEUTIC / DIAGNOSTIC MANEUVERS PERFORMED:  Antral biopsies taken  COMPLICATIONS:   None  IMPRESSION:     Esophageal varices. Distal esophageal erosions consistent with erosive reflux esophagitis Portal gastropathy. Hiatal hernia.                     Antral and  duodenal erosions-status post gastric biopsy  RECOMMENDATIONS:    Begin Inderal as primary prophylaxis against variceal hemorrhage. Repeat EGD in 18 months. Continue omeprazole 20 mg orally daily. Follow up on pathology. See colonoscopy report  ______________________________ R. Roetta Sessions, MD Caleen Essex  CC:  Glenford Peers, MD  n. eSIGNED:   R. Roetta Sessions at 12/18/2011 09:08 AM  Izetta Dakin, [phone]

## 2011-12-18 NOTE — Progress Notes (Signed)
Pt states she drank all of half gallon bowel prep, fleets enema this am. States bowels are clear.

## 2011-12-18 NOTE — H&P (Signed)
  I have seen & examined the patient prior to the procedure(s) today and reviewed the history and physical/consultation.  There have been no changes.  After consideration of the risks, benefits, alternatives and imponderables, the patient has consented to the procedure(s).   

## 2011-12-18 NOTE — Transfer of Care (Signed)
Immediate Anesthesia Transfer of Care Note  Patient: Gabriella Leon  Procedure(s) Performed:  COLONOSCOPY WITH PROPOFOL - procedure started @ 0844 ; entered cecum @  0847 cecal withdrawal time = 6 minutes; ESOPHAGOGASTRODUODENOSCOPY (EGD) WITH PROPOFOL - procedure ended @ 0839; BIOPSY - gastric erosions biopsies  Patient Location: PACU  Anesthesia Type: MAC  Level of Consciousness: awake, alert , oriented and patient cooperative  Airway & Oxygen Therapy: Patient Spontanous Breathing  Post-op Assessment: Report given to PACU RN and Post -op Vital signs reviewed and stable  Post vital signs: Reviewed and stable  Complications: No apparent anesthesia complications

## 2011-12-18 NOTE — Preoperative (Signed)
Beta Blockers   Reason not to administer Beta Blockers:Not Applicable 

## 2011-12-18 NOTE — Anesthesia Preprocedure Evaluation (Signed)
Anesthesia Evaluation  Patient identified by MRN, date of birth, ID band Patient awake    Reviewed: Allergy & Precautions, H&P , NPO status , Patient's Chart, lab work & pertinent test results  History of Anesthesia Complications Negative for: history of anesthetic complications  Airway Mallampati: II      Dental  (+) Teeth Intact   Pulmonary Current Smoker,  clear to auscultation        Cardiovascular hypertension, Pt. on medications Regular Normal    Neuro/Psych Depression    GI/Hepatic GERD-  Medicated,(+) Cirrhosis -       , Hepatitis -, B  Endo/Other    Renal/GU      Musculoskeletal   Abdominal   Peds  Hematology   Anesthesia Other Findings   Reproductive/Obstetrics                           Anesthesia Physical Anesthesia Plan  ASA: III  Anesthesia Plan: MAC   Post-op Pain Management:    Induction: Intravenous  Airway Management Planned: Simple Face Mask  Additional Equipment:   Intra-op Plan:   Post-operative Plan:   Informed Consent: I have reviewed the patients History and Physical, chart, labs and discussed the procedure including the risks, benefits and alternatives for the proposed anesthesia with the patient or authorized representative who has indicated his/her understanding and acceptance.     Plan Discussed with:   Anesthesia Plan Comments:         Anesthesia Quick Evaluation

## 2011-12-19 ENCOUNTER — Encounter (HOSPITAL_COMMUNITY): Payer: Self-pay | Admitting: Internal Medicine

## 2012-01-08 ENCOUNTER — Telehealth: Payer: Self-pay

## 2012-01-08 NOTE — Telephone Encounter (Signed)
Pt is needing to have oral surgery but she has to be medical cleared for her doctor. She does not have PCP [facility] and [facility] not give that kind of stuff. The patient  then told the doctor's office then Dr. Jena Gauss put her to sleep in [facility] they would like to know if we can give her a letter foe medical clearance so she can have her oral surgery. I have paper work for you to fill out if you agree.

## 2012-01-11 NOTE — Telephone Encounter (Signed)
I'm not familiar w oral surgeon procedures. I'd be more concerned about bleeding after multiple tooth extractions; No such thing as "clearance" for such procedures. Risks would be increased but not prohibitively.  Might be a good idea to have blood products on hand just in case bleeding and observe pt in hospital overnight following procedure.

## 2012-01-14 NOTE — Telephone Encounter (Signed)
Called([phone]) and talked to [place] at Dr. Barbette Merino office this morning and told her what Dr. Jena Gauss had said.I am going to fax her the note from Dr. Jena Gauss and the last office note.

## 2012-01-23 ENCOUNTER — Encounter: Payer: Self-pay | Admitting: Internal Medicine

## 2012-01-27 ENCOUNTER — Ambulatory Visit: Payer: Medicaid Other | Admitting: Gastroenterology

## 2012-02-03 ENCOUNTER — Encounter: Payer: Self-pay | Admitting: Gastroenterology

## 2012-02-03 ENCOUNTER — Ambulatory Visit (INDEPENDENT_AMBULATORY_CARE_PROVIDER_SITE_OTHER): Payer: Medicaid Other | Admitting: Gastroenterology

## 2012-02-03 VITALS — BP 132/80 | HR 88 | Temp 97.2°F | Ht 64.0 in | Wt 192.8 lb

## 2012-02-03 DIAGNOSIS — R1013 Epigastric pain: Secondary | ICD-10-CM

## 2012-02-03 DIAGNOSIS — K7460 Unspecified cirrhosis of liver: Secondary | ICD-10-CM

## 2012-02-03 LAB — CBC WITH DIFFERENTIAL/PLATELET
Basophils Absolute: 0 10*3/uL (ref 0.0–0.1)
Basophils Relative: 1 % (ref 0–1)
Eosinophils Relative: 5 % (ref 0–5)
Hemoglobin: 10.8 g/dL — ABNORMAL LOW (ref 12.0–15.0)
Lymphs Abs: 0.8 10*3/uL (ref 0.7–4.0)
MCH: 34.6 pg — ABNORMAL HIGH (ref 26.0–34.0)
Monocytes Relative: 11 % (ref 3–12)
Neutrophils Relative %: 51 % (ref 43–77)
RBC: 3.12 MIL/uL — ABNORMAL LOW (ref 3.87–5.11)

## 2012-02-03 LAB — BASIC METABOLIC PANEL
Calcium: 9.5 mg/dL (ref 8.4–10.5)
Potassium: 3.8 mEq/L (ref 3.5–5.3)
Sodium: 137 mEq/L (ref 135–145)

## 2012-02-03 LAB — HEPATIC FUNCTION PANEL
ALT: 26 U/L (ref 0–35)
Indirect Bilirubin: 1.1 mg/dL — ABNORMAL HIGH (ref 0.0–0.9)
Total Protein: 8.2 g/dL (ref 6.0–8.3)

## 2012-02-03 LAB — LIPASE: Lipase: 65 U/L — ABNORMAL HIGH (ref 11–59)

## 2012-02-03 NOTE — Progress Notes (Signed)
Referring Provider: Tylene Fantasia., PA Primary Care Physician:  Tylene Fantasia., PA, PA-C Primary Gastroenterologist: Dr. Jena Gauss   Chief Complaint  Patient presents with  . Follow-up    stomach hurting    HPI:   Gabriella Leon presents today in follow-up after EGD and TCS. Please see findings below. Has cirrhosis, did not get labs as indicated at last visit. Notes hx of Hep C, unsure if chronic. States new onset of epigastric pain, bloating last night. +nausea, no vomiting. Ate a taco yesterday. No fever or chills. +lack of appetite for only one day. No sick contacts. Had flu about 3 weeks ago.  Normal BMs. No melena or hematochezia. Quit ETOH for 3 weeks, started up again this past weekend, now back off. +pruritis. Clear yellow urine.   Has not started taking Inderal as instructed at time of EGD.  Past Medical History  Diagnosis Date  . Diabetes mellitus   . Hypertension   . Cirrhosis   . Hepatitis B antibody positive   . Hepatitis C   . Ulcer     ?  [name] Acid reflux disease with ulcer   . Depression   . Helicobacter pylori ab+     July 2010, s/p treatment  . GERD (gastroesophageal reflux disease)     Past Surgical History  Procedure Date  . Cesarean section 1988    [facility]  . Cholecystectomy greater than 10 yrs    [facility]  . Appendectomy     age 22  . Esophageal biopsy 12/18/2011    Procedure: BIOPSY;  Surgeon: Corbin Ade, MD;  Location: [facility];  Service: Endoscopy;  Laterality: N/A;  gastric erosions biopsies  . Colonoscopy 12/18/11    minimal anal canal hemorrhoids, friable rectal and colonic mucosa, left-sided diverticulosis, repeat in 2022.   [name] Esophagogastroduodenoscopy 12/18/11    3 columns of Grade III esophageal varices, reflux esohpagitis, portal gastropathy, path with chronic gastritis, negative H.pylori, surveillance in June 2014    Current Outpatient Prescriptions  Medication Sig Dispense Refill  . cyclobenzaprine (FLEXERIL) 10 MG tablet Take 10 mg by  mouth 3 (three) times daily as needed. For spasms/pain       . folic acid (FOLVITE) 1 MG tablet Take 1 mg by mouth daily.        [name] glimepiride (AMARYL) 2 MG tablet Take 2 mg by mouth daily.        [name] ibuprofen (ADVIL,MOTRIN) 200 MG tablet Take 400 mg by mouth every 6 (six) hours as needed. For pain       . lisinopril (PRINIVIL,ZESTRIL) 5 MG tablet Take 5 mg by mouth daily.        . Multiple Vitamin (MULITIVITAMIN WITH MINERALS) TABS Take 1 tablet by mouth daily.        [name] omeprazole (PRILOSEC) 20 MG capsule Take 1 capsule (20 mg total) by mouth daily. 30 min before meals  30 capsule  1  . propranolol (INDERAL LA) 60 MG 24 hr capsule Take 1 capsule (60 mg total) by mouth daily.  30 capsule  11    Allergies as of 02/03/2012  . (No Known Allergies)    Family History  Problem Relation Age of Onset  . Diabetes Mother   . Cancer Paternal Uncle     passed away age 78  . Anesthesia problems Neg Hx   . Hypotension Neg Hx   . Malignant hyperthermia Neg Hx   . Pseudochol deficiency Neg Hx     History  Social History  . Marital Status: Legally Separated    Spouse Name: N/A    Number of Children: N/A  . Years of Education: N/A   Social History Main Topics  . Smoking status: Current Everyday Smoker -- 0.5 packs/day for 20 years    Types: Cigarettes  . Smokeless tobacco: Never Used  . Alcohol Use: 3.0 oz/week    5 Shots of liquor per week     1/2 pint gin daily  . Drug Use: No  . Sexually Active: Yes    Birth Control/ Protection: Post-menopausal   Other Topics Concern  . None   Social History Narrative  . None    Review of Systems: Gen: Denies fever, chills, anorexia. Denies fatigue, weakness, weight loss.  CV: Denies chest pain, palpitations, syncope, peripheral edema, and claudication. Resp: Denies dyspnea at rest, cough, wheezing, coughing up blood, and pleurisy. GI: Denies vomiting blood, jaundice, and fecal incontinence.   Denies dysphagia or odynophagia. Derm:  +itching Psych: Denies depression, anxiety, memory loss, confusion. No homicidal or suicidal ideation.  Heme: Denies bruising, bleeding, and enlarged lymph nodes.  Physical Exam: BP 132/80  Pulse 88  Temp(Src) 97.2 F (36.2 C) (Temporal)  Ht 5\' 4"  (1.626 m)  Wt 192 lb 12.8 oz (87.454 kg)  BMI 33.09 kg/m2 General:   Alert and oriented. No distress noted. Pleasant and cooperative.  Head:  Normocephalic and atraumatic. Eyes:  Conjuctiva clear without scleral icterus. Mouth:  Oral mucosa pink and moist. Good dentition. No lesions. Neck:  Supple, without mass or thyromegaly. Heart:  S1, S2 present without murmurs, rubs, or gallops. Regular rate and rhythm. Abdomen:  +BS, soft, mildly TTP epigastric region and non-distended. No rebound or guarding. No HSM or masses noted. Msk:  Symmetrical without gross deformities. Normal posture. Extremities:  Without edema. Neurologic:  Alert and  oriented x4;  grossly normal neurologically. Skin:  Intact without significant lesions or rashes. Cervical Nodes:  No significant cervical adenopathy. Psych:  Alert and cooperative. Normal mood and affect.

## 2012-02-03 NOTE — Patient Instructions (Signed)
Please have blood work completed. We have marked these to be processed as soon as possible. We will call you with the results and further plan.  Continue to stay away from alcohol. This will only hurt your liver. If you have severe abdominal pain, nausea, vomiting, fever, chills, confusion, please go to the emergency room immediately.  We will be in touch shortly.

## 2012-02-04 ENCOUNTER — Encounter: Payer: Self-pay | Admitting: Gastroenterology

## 2012-02-04 DIAGNOSIS — R1013 Epigastric pain: Secondary | ICD-10-CM | POA: Insufficient documentation

## 2012-02-04 LAB — PROTIME-INR
INR: 1.18 (ref <1.50)
Prothrombin Time: 15.5 seconds — ABNORMAL HIGH (ref 11.6–15.2)

## 2012-02-04 LAB — AFP TUMOR MARKER: AFP-Tumor Marker: 7.7 ng/mL (ref 0.0–8.0)

## 2012-02-04 NOTE — Assessment & Plan Note (Signed)
56 year old with recurrent epigastric pain. This was noted at last visit in Dec 2012. EGD on file with chronic gastritis, esophageal varices as outlined above. Negative h.pylori. Ate a taco yesterday, symptoms started yesterday evening. On prilosec. No hematochezia or melena. +nausea, no vomiting. Has not had current HFP. Admits to drinking over the weekend after abstaining for a short time.   Likely epigastric pain r/t gastritis, recent ETOH use, but unable to rule out other acute process. Do not have updated HFP, so we will need to draw this, as well as lipase. Instructed to present to ED if worsening pain, N/V, fever, chills.   HFP, Lipase, CBC stat Further labs as outlined under cirrhosis Continue Prilosec Will contact with results and further intervention

## 2012-02-04 NOTE — Assessment & Plan Note (Addendum)
Question HCV cirrhosis. Needs Hep C RNA quantitative, updated HFP. CT on file from Nov 2012. EGD surveillance of varices due June 2014. Needs to start Inderal as instructed in December.   HFP, CBC, PT/INR, AFP, Hep C RNA quant, BMP, assess immunity to Hep A and B Needs Hep A and B vaccinations IF indicated after above labs Continue ETOH cessation AFP, [place] due in may 2013

## 2012-02-04 NOTE — Progress Notes (Signed)
Quick Note:  Hgb stable.  Leukopenia noted, sees Dr. Mariel Sleet. PLTs stable, low, r/t cirrhosis HFP: Tbili improved from 2 months ago, AP increased slightly, AST improved.  Likely results consistent with known ETOH use.  Lipase mildly elevated, does not mean pancreatitis.    How is epigastric pain today? Any further nausea? Any vomiting? Still awaiting Hep C results. ______

## 2012-02-04 NOTE — Progress Notes (Signed)
Faxed to PCP

## 2012-02-06 LAB — HEPATITIS C RNA QUANTITATIVE
HCV Quantitative Log: 5.98 {Log} — ABNORMAL HIGH (ref <1.63)
HCV Quantitative: 952176 IU/mL — ABNORMAL HIGH (ref <43)

## 2012-02-09 NOTE — Progress Notes (Signed)
Quick Note:  Needs Hep A vaccine. Appears has antibodies to Hep B.  Appears chronic Hep C, needs treatment at Hep C clinic in [place] as soon as possible. Any further abdominal pain? ______

## 2012-02-17 ENCOUNTER — Ambulatory Visit: Payer: Medicaid Other | Admitting: Urgent Care

## 2012-02-19 NOTE — Progress Notes (Signed)
Patient ID: Gabriella Leon, female   DOB: November 26, 1956, 56 y.o.   MRN: [id number] Pt called this morning because she received the letter I send out. I told her that we was going to refer her to the [facility] in [place] and we would let her know as soon as was heard anything. She is going to call her PCP to see about getting the Hep A shot.

## 2012-02-23 ENCOUNTER — Encounter (HOSPITAL_COMMUNITY): Payer: Self-pay | Admitting: Pharmacy Technician

## 2012-02-24 ENCOUNTER — Ambulatory Visit: Payer: Medicaid Other | Admitting: Gastroenterology

## 2012-03-01 ENCOUNTER — Other Ambulatory Visit: Payer: Self-pay | Admitting: Gastroenterology

## 2012-03-01 ENCOUNTER — Encounter: Payer: Self-pay | Admitting: Gastroenterology

## 2012-03-01 DIAGNOSIS — B1920 Unspecified viral hepatitis C without hepatic coma: Secondary | ICD-10-CM

## 2012-03-01 MED ORDER — PROPRANOLOL HCL ER 60 MG PO CP24: 60.0000 mg | ORAL_CAPSULE | Freq: Every day | ORAL | Status: DC

## 2012-03-01 NOTE — Progress Notes (Signed)
MELD: 11

## 2012-03-01 NOTE — Progress Notes (Signed)
Will obtain genotype (Hep C+, +RNA, referring to Hep C Clinic).

## 2012-03-01 NOTE — Progress Notes (Signed)
Let's get genotype prior to her visit with Hep C clinic. This will help expedite treatment. Please have patient get done today or as soon as possible.   Also, make sure she picks up prescription for Inderal, take once daily. This is important to protect against variceal bleeding. I believe there was confusion about this at her appt. I am resending the rx to her pharmacy just in case.   Also, let's get updated ferritin, TIBC, iron. I have ordered these as well.

## 2012-03-01 NOTE — Progress Notes (Signed)
LMOM to call back

## 2012-03-02 ENCOUNTER — Encounter: Payer: Self-pay | Admitting: Gastroenterology

## 2012-03-02 ENCOUNTER — Telehealth: Payer: Self-pay | Admitting: Gastroenterology

## 2012-03-02 NOTE — Telephone Encounter (Signed)
Received fax from Hep C Clinic-  Pt is scheduled in the Hep C Clinic on 07/11 @ 2pm- they will contact pt

## 2012-03-03 NOTE — Progress Notes (Signed)
Pt aware of Rx and she will get her labs Thursday.

## 2012-03-04 ENCOUNTER — Encounter (HOSPITAL_COMMUNITY)
Admission: RE | Admit: 2012-03-04 | Discharge: 2012-03-04 | Disposition: A | Discharge status: Home / Self Care | Payer: Medicaid Other | Source: Ambulatory Visit | Attending: Anesthesiology | Admitting: Anesthesiology

## 2012-03-04 ENCOUNTER — Encounter (HOSPITAL_COMMUNITY)
Admission: RE | Admit: 2012-03-04 | Discharge: 2012-03-04 | Disposition: A | Discharge status: Home / Self Care | Payer: Medicaid Other | Source: Ambulatory Visit | Attending: Oral Surgery | Admitting: Oral Surgery

## 2012-03-04 ENCOUNTER — Encounter (HOSPITAL_COMMUNITY): Payer: Self-pay

## 2012-03-04 ENCOUNTER — Other Ambulatory Visit: Payer: Self-pay | Admitting: Gastroenterology

## 2012-03-04 HISTORY — DX: Calculus of gallbladder without cholecystitis without obstruction: K80.20

## 2012-03-04 HISTORY — DX: Anxiety disorder, unspecified: F41.9

## 2012-03-04 HISTORY — DX: Encounter for other specified aftercare: Z51.89

## 2012-03-04 HISTORY — DX: Reserved for inherently not codable concepts without codable children: IMO0001

## 2012-03-04 LAB — CBC
MCHC: 34.6 g/dL (ref 30.0–36.0)
RDW: 14.5 % (ref 11.5–15.5)
WBC: 4.4 10*3/uL (ref 4.0–10.5)

## 2012-03-04 LAB — COMPREHENSIVE METABOLIC PANEL
ALT: 26 U/L (ref 0–35)
AST: 64 U/L — ABNORMAL HIGH (ref 0–37)
Albumin: 2.9 g/dL — ABNORMAL LOW (ref 3.5–5.2)
Alkaline Phosphatase: 125 U/L — ABNORMAL HIGH (ref 39–117)
Potassium: 3.3 mEq/L — ABNORMAL LOW (ref 3.5–5.1)
Sodium: 133 mEq/L — ABNORMAL LOW (ref 135–145)
Total Protein: 8.1 g/dL (ref 6.0–8.3)

## 2012-03-04 NOTE — Pre-Procedure Instructions (Signed)
20 Gabriella Leon  03/04/2012   Your procedure is scheduled on:  Monday March 08, 2012  Report to [facility] at 0530 AM.  Call this number if you have problems the morning of surgery: [phone]   Remember:   Do not eat food:After Midnight.  May have clear liquids: up to 4 Hours before arrival. (up to 1:30am)  Clear liquids include soda, tea, black coffee, apple or grape juice, broth.  Take these medicines the morning of surgery with A SIP OF WATER: flexeril, omeprazole, propranolol   Do not wear jewelry, make-up or nail polish.  Do not wear lotions, powders, or perfumes. You may wear deodorant.  Do not shave 48 hours prior to surgery.  Do not bring valuables to the hospital.  Contacts, dentures or bridgework may not be worn into surgery.  Leave suitcase in the car. After surgery it may be brought to your room.  For patients admitted to the hospital, checkout time is 11:00 AM the day of discharge.   Patients discharged the day of surgery will not be allowed to drive home.  Name and phone number of your driver: Desiree Hane [phone]  Special Instructions: CHG Shower Use Special Wash: 1/2 bottle night before surgery and 1/2 bottle morning of surgery.   Please read over the following fact sheets that you were given: Pain Booklet, Coughing and Deep Breathing and Surgical Site Infection Prevention

## 2012-03-05 LAB — FERRITIN: Ferritin: 586 ng/mL — ABNORMAL HIGH (ref 10–291)

## 2012-03-05 LAB — IRON AND TIBC: %SAT: 72 % — ABNORMAL HIGH (ref 20–55)

## 2012-03-05 NOTE — Consult Note (Signed)
Anesthesia:  Patient is a 56 year old female scheduled for multiple teeth extractions, alveoloplasty on 03/08/12.  Her history is significant for liver cirrhosis with thrombocytopenia and esophageal varices with history of Hep C and alcoholism (sober for 2 years but with relapse in February), Hep B antibody positive, DM2, HTN, erosive reflux esophagitis by EGD 11/2011, GI ulcer with history of + H. Pylori, anemia s/p transfusion, smoking, depression, anxiety.  [facility]  Her Hematologist is Dr. Mariel Sleet.  Her Gastroenterologist is Dr. Jena Gauss.  He is aware of planned procedure and feels with her cirrhosis and thrombocytopenia she would be at increased risk, but not prohibitive.  He recommended blood products on standby and consideration of an overnight stay.  She is going to the Hepatitis C Clinic in [place] in July.  Labs noted.  AST elevated at 64 and ALT normal at 26 (these were unchanged since 02/03/12), Total Bili elevated at 2.2 (was 2.0 on 02/03/12).  H/H stable at 11.3/32.7, PLT 69 (up from 53 on 02/03/12).  Coags weren't done at her PAT appointment so will need to get on the day of surgery.  (INR was normal on 02/03/12.)  EKG on 02/10/11 showed NSR, anteroseptal infarct (age undetermined).  Currently there are no comparison EKGs available. No CV symptoms were reported at her PAT appointment.  Clinical correlation on the day of surgery.  CXR on 03/04/12 showed no acute cardiopulmonary process.  I spoke with Dr. Barbette Merino regarding her pre-operative labs and Dr. Luvenia Starch recommendations.  He would like a T&S and PLTs available if needed.

## 2012-03-08 ENCOUNTER — Encounter (HOSPITAL_COMMUNITY): Payer: Self-pay | Admitting: *Deleted

## 2012-03-08 ENCOUNTER — Encounter (HOSPITAL_COMMUNITY)
Admission: RE | Disposition: A | Discharge status: Home / Self Care | Payer: Self-pay | Source: Ambulatory Visit | Attending: Oral Surgery

## 2012-03-08 ENCOUNTER — Ambulatory Visit (HOSPITAL_COMMUNITY)
Admission: RE | Admit: 2012-03-08 | Discharge: 2012-03-08 | Disposition: A | Discharge status: Home / Self Care | Payer: Medicaid Other | Source: Ambulatory Visit | Attending: Oral Surgery | Admitting: Oral Surgery

## 2012-03-08 ENCOUNTER — Encounter (HOSPITAL_COMMUNITY): Payer: Self-pay | Admitting: Vascular Surgery

## 2012-03-08 ENCOUNTER — Ambulatory Visit (HOSPITAL_COMMUNITY): Payer: Medicaid Other | Admitting: Vascular Surgery

## 2012-03-08 DIAGNOSIS — E119 Type 2 diabetes mellitus without complications: Secondary | ICD-10-CM | POA: Insufficient documentation

## 2012-03-08 DIAGNOSIS — Z01812 Encounter for preprocedural laboratory examination: Secondary | ICD-10-CM | POA: Insufficient documentation

## 2012-03-08 DIAGNOSIS — F411 Generalized anxiety disorder: Secondary | ICD-10-CM | POA: Insufficient documentation

## 2012-03-08 DIAGNOSIS — I10 Essential (primary) hypertension: Secondary | ICD-10-CM | POA: Insufficient documentation

## 2012-03-08 DIAGNOSIS — K219 Gastro-esophageal reflux disease without esophagitis: Secondary | ICD-10-CM | POA: Insufficient documentation

## 2012-03-08 DIAGNOSIS — Z1211 Encounter for screening for malignant neoplasm of colon: Secondary | ICD-10-CM

## 2012-03-08 DIAGNOSIS — J449 Chronic obstructive pulmonary disease, unspecified: Secondary | ICD-10-CM | POA: Insufficient documentation

## 2012-03-08 DIAGNOSIS — B1920 Unspecified viral hepatitis C without hepatic coma: Secondary | ICD-10-CM | POA: Insufficient documentation

## 2012-03-08 DIAGNOSIS — F3289 Other specified depressive episodes: Secondary | ICD-10-CM | POA: Insufficient documentation

## 2012-03-08 DIAGNOSIS — J4489 Other specified chronic obstructive pulmonary disease: Secondary | ICD-10-CM | POA: Insufficient documentation

## 2012-03-08 DIAGNOSIS — K029 Dental caries, unspecified: Secondary | ICD-10-CM

## 2012-03-08 DIAGNOSIS — K089 Disorder of teeth and supporting structures, unspecified: Secondary | ICD-10-CM | POA: Insufficient documentation

## 2012-03-08 DIAGNOSIS — Z01818 Encounter for other preprocedural examination: Secondary | ICD-10-CM | POA: Insufficient documentation

## 2012-03-08 DIAGNOSIS — F329 Major depressive disorder, single episode, unspecified: Secondary | ICD-10-CM | POA: Insufficient documentation

## 2012-03-08 DIAGNOSIS — R1013 Epigastric pain: Secondary | ICD-10-CM

## 2012-03-08 DIAGNOSIS — K8020 Calculus of gallbladder without cholecystitis without obstruction: Secondary | ICD-10-CM | POA: Insufficient documentation

## 2012-03-08 DIAGNOSIS — K7460 Unspecified cirrhosis of liver: Secondary | ICD-10-CM

## 2012-03-08 DIAGNOSIS — M278 Other specified diseases of jaws: Secondary | ICD-10-CM | POA: Insufficient documentation

## 2012-03-08 DIAGNOSIS — F17200 Nicotine dependence, unspecified, uncomplicated: Secondary | ICD-10-CM | POA: Insufficient documentation

## 2012-03-08 HISTORY — PX: MULTIPLE EXTRACTIONS WITH ALVEOLOPLASTY: SHX5342

## 2012-03-08 LAB — TYPE AND SCREEN

## 2012-03-08 LAB — GLUCOSE, CAPILLARY
Glucose-Capillary: 105 mg/dL — ABNORMAL HIGH (ref 70–99)
Glucose-Capillary: 107 mg/dL — ABNORMAL HIGH (ref 70–99)

## 2012-03-08 LAB — APTT: aPTT: 34 seconds (ref 24–37)

## 2012-03-08 LAB — PROTIME-INR: Prothrombin Time: 16 seconds — ABNORMAL HIGH (ref 11.6–15.2)

## 2012-03-08 SURGERY — MULTIPLE EXTRACTION WITH ALVEOLOPLASTY
Anesthesia: General

## 2012-03-08 MED ORDER — EPHEDRINE SULFATE 50 MG/ML IJ SOLN
INTRAMUSCULAR | Status: DC | PRN
  Administered 2012-03-08: 5 mg via INTRAVENOUS
  Administered 2012-03-08: 10 mg via INTRAVENOUS

## 2012-03-08 MED ORDER — NEOSTIGMINE METHYLSULFATE 1 MG/ML IJ SOLN
INTRAMUSCULAR | Status: DC | PRN
  Administered 2012-03-08: 5 mg via INTRAVENOUS

## 2012-03-08 MED ORDER — HYDROMORPHONE HCL PF 1 MG/ML IJ SOLN: 0.2500 mg | INTRAMUSCULAR | Status: DC | PRN

## 2012-03-08 MED ORDER — CEFAZOLIN SODIUM 1-5 GM-% IV SOLN
INTRAVENOUS | Status: AC
  Filled 2012-03-08: qty 50

## 2012-03-08 MED ORDER — GLYCOPYRROLATE 0.2 MG/ML IJ SOLN
INTRAMUSCULAR | Status: DC | PRN
  Administered 2012-03-08: .8 mg via INTRAVENOUS

## 2012-03-08 MED ORDER — ONDANSETRON HCL 4 MG/2ML IJ SOLN
INTRAMUSCULAR | Status: DC | PRN
  Administered 2012-03-08: 4 mg via INTRAVENOUS

## 2012-03-08 MED ORDER — FENTANYL CITRATE 0.05 MG/ML IJ SOLN
INTRAMUSCULAR | Status: DC | PRN
  Administered 2012-03-08 (×4): 50 ug via INTRAVENOUS

## 2012-03-08 MED ORDER — SODIUM CHLORIDE 0.9 % IR SOLN
Status: DC | PRN
  Administered 2012-03-08: 1000 mL

## 2012-03-08 MED ORDER — ROCURONIUM BROMIDE 100 MG/10ML IV SOLN
INTRAVENOUS | Status: DC | PRN
  Administered 2012-03-08: 30 mg via INTRAVENOUS

## 2012-03-08 MED ORDER — PROPOFOL 10 MG/ML IV EMUL
INTRAVENOUS | Status: DC | PRN
  Administered 2012-03-08: 150 mg via INTRAVENOUS

## 2012-03-08 MED ORDER — HYDROCODONE-IBUPROFEN 5-200 MG PO TABS: 1.0000 | ORAL_TABLET | Freq: Three times a day (TID) | ORAL | Status: AC | PRN

## 2012-03-08 MED ORDER — DROPERIDOL 2.5 MG/ML IJ SOLN: 0.6250 mg | INTRAMUSCULAR | Status: DC | PRN

## 2012-03-08 MED ORDER — LACTATED RINGERS IV SOLN
INTRAVENOUS | Status: DC | PRN
  Administered 2012-03-08: 07:00:00 via INTRAVENOUS

## 2012-03-08 MED ORDER — CEFAZOLIN SODIUM 1-5 GM-% IV SOLN
INTRAVENOUS | Status: DC | PRN
  Administered 2012-03-08: 1 g via INTRAVENOUS

## 2012-03-08 MED ORDER — MIDAZOLAM HCL 5 MG/5ML IJ SOLN
INTRAMUSCULAR | Status: DC | PRN
  Administered 2012-03-08 (×2): 1 mg via INTRAVENOUS

## 2012-03-08 MED ORDER — LIDOCAINE-EPINEPHRINE 2 %-1:100000 IJ SOLN
INTRAMUSCULAR | Status: DC | PRN
  Administered 2012-03-08: 16 mL

## 2012-03-08 MED ORDER — LIDOCAINE HCL (CARDIAC) 20 MG/ML IV SOLN
INTRAVENOUS | Status: DC | PRN
  Administered 2012-03-08: 60 mg via INTRAVENOUS
  Administered 2012-03-08: 40 mg via INTRAVENOUS

## 2012-03-08 SURGICAL SUPPLY — 28 items
BUR CROSS CUT FISSURE 1.6 (BURR) ×2 IMPLANT
BUR EGG ELITE 4.0 (BURR) ×2 IMPLANT
CANISTER SUCTION 2500CC (MISCELLANEOUS) ×2 IMPLANT
CLOTH BEACON ORANGE TIMEOUT ST (SAFETY) ×2 IMPLANT
COVER SURGICAL LIGHT HANDLE (MISCELLANEOUS) ×2 IMPLANT
CRADLE DONUT ADULT HEAD (MISCELLANEOUS) ×2 IMPLANT
DECANTER SPIKE VIAL GLASS SM (MISCELLANEOUS) ×2 IMPLANT
GAUZE PACKING FOLDED 2  STR (GAUZE/BANDAGES/DRESSINGS) ×1
GAUZE PACKING FOLDED 2 STR (GAUZE/BANDAGES/DRESSINGS) ×1 IMPLANT
GLOVE BIO SURGEON STRL SZ 6.5 (GLOVE) ×4 IMPLANT
GLOVE BIO SURGEON STRL SZ7 (GLOVE) ×4 IMPLANT
GLOVE BIO SURGEON STRL SZ7.5 (GLOVE) ×2 IMPLANT
GLOVE BIOGEL PI IND STRL 7.0 (GLOVE) ×1 IMPLANT
GLOVE BIOGEL PI INDICATOR 7.0 (GLOVE) ×1
GOWN STRL NON-REIN LRG LVL3 (GOWN DISPOSABLE) ×4 IMPLANT
GOWN STRL REIN XL XLG (GOWN DISPOSABLE) ×2 IMPLANT
KIT BASIN OR (CUSTOM PROCEDURE TRAY) ×2 IMPLANT
KIT ROOM TURNOVER OR (KITS) ×2 IMPLANT
NEEDLE 22X1 1/2 (OR ONLY) (NEEDLE) ×2 IMPLANT
NS IRRIG 1000ML POUR BTL (IV SOLUTION) ×2 IMPLANT
PAD ARMBOARD 7.5X6 YLW CONV (MISCELLANEOUS) ×4 IMPLANT
SUT CHROMIC 3 0 PS 2 (SUTURE) ×2 IMPLANT
SYR 50ML SLIP (SYRINGE) IMPLANT
TOWEL OR 17X26 10 PK STRL BLUE (TOWEL DISPOSABLE) ×2 IMPLANT
TRAY ENT MC OR (CUSTOM PROCEDURE TRAY) ×2 IMPLANT
TUBING IRRIGATION (MISCELLANEOUS) IMPLANT
WATER STERILE IRR 1000ML POUR (IV SOLUTION) IMPLANT
YANKAUER SUCT BULB TIP NO VENT (SUCTIONS) ×4 IMPLANT

## 2012-03-08 NOTE — Op Note (Signed)
03/08/2012  8:27 AM  PATIENT:  Gabriella Leon  56 y.o. female  PRE-OPERATIVE DIAGNOSIS:  non restorable teeth #'s 1, 2, 3, 4, 5, 6, 7, 8, 9, 10, 11, 12, 13, 14, 15, 17, 20  POST-OPERATIVE DIAGNOSIS:  SAME plus bilateral maxillary buccal exostoses, tooth # 8 not present PROCEDURE:  Procedure(s): MULTIPLE EXTRACION WITH ALVEOLOPLASTY teeth #'s1, 2, 3, 4, 5, 6, 7, 9, 10, 11, 12, 13, 14, 15, 17, 20, removal buccal exostoses right and left maxilla, alveoloplasty right and left maxilla  SURGEON:  Surgeon(s): Georgia Lopes, DDS  ANESTHESIA:   local and general  EBL:  minimal  DRAINS: none   LOCAL MEDICATIONS USED:  LIDOCAINE  17CC  SPECIMEN:  No Specimen  COUNTS:  YES  PLAN OF CARE: Discharge to home after PACU  PATIENT DISPOSITION:  PACU - hemodynamically stable.   PROCEDURE DETAILS: Dictation # [id number]  Georgia Lopes, DMD 03/08/2012 8:27 AM

## 2012-03-08 NOTE — Progress Notes (Signed)
cbg 107 

## 2012-03-08 NOTE — Anesthesia Postprocedure Evaluation (Signed)
Anesthesia Post Note  Patient: Gabriella Leon  Procedure(s) Performed: Procedure(s) (LRB): MULTIPLE EXTRACION WITH ALVEOLOPLASTY (N/A)  Anesthesia type: general  Patient location: PACU  Post pain: Pain level controlled  Post assessment: Patient's Cardiovascular Status Stable  Last Vitals:  Filed Vitals:   03/08/12 0913  BP:   Pulse: 61  Temp:   Resp: 18    Post vital signs: Reviewed and stable  Level of consciousness: sedated  Complications: No apparent anesthesia complications

## 2012-03-08 NOTE — OR Nursing (Signed)
Throatpack in @753 

## 2012-03-08 NOTE — Anesthesia Preprocedure Evaluation (Addendum)
Anesthesia Evaluation  Patient identified by MRN, date of birth, ID band Patient awake    Reviewed: Allergy & Precautions, H&P , NPO status , Patient's Chart, lab work & pertinent test results  Airway       Dental   Pulmonary Current Smoker,    Pulmonary exam normal       Cardiovascular hypertension, Pt. on home beta blockers     Neuro/Psych    GI/Hepatic GERD-  Medicated,(+) Cirrhosis -       , Hepatitis -, B and C  Endo/Other  Diabetes mellitus-, Type 2, Oral Hypoglycemic Agents  Renal/GU      Musculoskeletal   Abdominal   Peds  Hematology   Anesthesia Other Findings   Reproductive/Obstetrics                          Anesthesia Physical Anesthesia Plan  ASA: III  Anesthesia Plan: General   Post-op Pain Management:    Induction:   Airway Management Planned: Oral ETT  Additional Equipment:   Intra-op Plan:   Post-operative Plan: Extubation in OR  Informed Consent: I have reviewed the patients History and Physical, chart, labs and discussed the procedure including the risks, benefits and alternatives for the proposed anesthesia with the patient or authorized representative who has indicated his/her understanding and acceptance.     Plan Discussed with: CRNA, Anesthesiologist and Surgeon  Anesthesia Plan Comments:         Anesthesia Quick Evaluation

## 2012-03-08 NOTE — Op Note (Signed)
NAME:  ROZINA, POINTER        ACCOUNT NO.:  [id number]  MEDICAL RECORD NO.:  [id number]  LOCATION:  [facility]                         FACILITY:  [facility]  PHYSICIAN:  Georgia Lopes, M.D.  DATE OF BIRTH:  03-Jun-1956  DATE OF PROCEDURE: DATE OF DISCHARGE:                              OPERATIVE REPORT   PREOPERATIVE DIAGNOSIS:  Nonrestorable teeth numbers 1, 2, 3, 4, 5, 6, 7, 8, 9, 10, 11, 12, 13, 14, 15, 17, 20.  POSTOPERATIVE DIAGNOSES:  Nonrestorable teeth numbers 1, 2, 3, 4, 5, 6, 7, 8, 9, 10, 11, 12, 13, 14, 15, 17, 20 plus bilateral buccal maxillary exostoses and tooth number 8 not present.  PROCEDURE:  Removal of teeth numbers 1, 2, 3, 4, 5, 6, 7, 9, 10, 11, 12, 13, 14, 15, 17, 20; removal of bilateral maxillary buccal exostoses; alveoplasty, right and left maxilla.  SURGEON:  Georgia Lopes, MD  ANESTHESIA:  General, oral.  Dr. Krista Blue attending.  INDICATIONS FOR PROCEDURE:  Santosha is a 56 year old female who has past medical history significant for hypertension, diabetes, COPD, cirrhosis, GERD.  Her preoperative platelet count in December was 50,000.  On March 04, 2012, platelets were 69,000.  INR 1.25.  Because of her coagulopathy and significant past medical history as well as the number of teeth that required removal, it was recommended that the patient undergo general anesthesia and the procedure was scheduled for [facility] for intubation.  PROCEDURE:  The patient was taken to the operating room and placed on the table in supine position.  General anesthesia was administered intravenously and an oral endotracheal tube was placed and marked.  The patient was then draped for the procedure.  The eyes were protected.  A time-out was performed.  Posterior pharynx was suctioned.  A throat pack was placed.  2% lidocaine with 1:100,000 epinephrine was infiltrated in an inferior alveolar block on the left side and a buccal and palatal infiltration in the maxilla.  A bite  block and sweetheart were placed in the mouth and the left mandible was operated first.  A 15 blade was used to make a full-thickness incision around tooth #17 and 20.  Periosteum was reflected with a periosteal elevator.  Bone was removed around tooth #20 and the tooth was elevated with a 301 elevator and removed with the rongeurs.  Tooth #17 was also removed with the rongeurs.  The area was then curetted and irrigated and sutured closed in the left maxilla.  A 15 blade was used to make a full-thickness incision beginning at the distal aspect of #15 on the buccal surface of the tooth, carried anteriorly to incise the papilla and at around tooth #9 on the buccal and then on the palatal aspect of the teeth.  The periosteum was reflected.  Interproximal bone was removed with the dental handpiece and then the teeth were elevated and removed with a 301 elevator and the upper #150 forceps.  Sockets were curetted, irrigated, and then the periosteum was further reflected with a periosteal elevator.  Several bony exostoses were noted approximately 1 cm x 3 mm x 3 mm from the anterior buccal cortical plate.  These were removed with the egg-shaped bur under irrigation.  Alveoplasty  was then performed with the egg- shaped bur and the bone file.  Then, the left side was irrigated and sutured with 3-0 chromic.  A bite block and sweetheart retractor were repositioned to the other side of the mouth.  A 15 blade was used to make a full-thickness incision on the buccal and palatal aspects of teeth numbers 2, 3, 4, 5, 6, 7 and adjoining tooth #9.  The periosteum was reflected and the interproximal bone was removed with the [employer] handpiece under irrigation.  Then, 301 elevator was used to elevate the teeth and the teeth numbers 2, 3, 4, 5, 6, 7 were removed with the upper #150 forceps.  Tooth #8 was not identified in the bone and had apparently been previously extracted.  Then, exostoses were noted on  the buccal cortical plate in the area of the teeth numbers 4, 5, and 6. These were removed with the egg-shaped bur.  Then, the alveoplasty was performed with the egg-shaped bur and the bone file.  The area was then irrigated and closed with 3-0 chromic.  The oral cavity was inspected and found to have good hemostasis, contour, and closure.  The oral cavity was suctioned.  Throat pack was removed.  The patient was awakened and taken to the recovery room breathing spontaneously in good condition.  ESTIMATED BLOOD LOSS:  Minimal.  COMPLICATIONS:  None.  COUNTS:  Correct.  SPECIMENS:  No specimens.     Georgia Lopes, M.D.     SMJ/MEDQ  D:  03/08/2012  T:  03/08/2012  Job:  [id number]

## 2012-03-08 NOTE — Transfer of Care (Signed)
Immediate Anesthesia Transfer of Care Note  Patient: Gabriella Leon  Procedure(s) Performed: Procedure(s) (LRB): MULTIPLE EXTRACION WITH ALVEOLOPLASTY (N/A)  Patient Location: PACU  Anesthesia Type: General  Level of Consciousness: awake and oriented  Airway & Oxygen Therapy: Patient Spontanous Breathing and Patient connected to face mask oxygen  Post-op Assessment: Report given to PACU RN and Post -op Vital signs reviewed and stable  Post vital signs: Reviewed and stable  Complications: No apparent anesthesia complications

## 2012-03-08 NOTE — OR Nursing (Signed)
Throat pack out @ 815

## 2012-03-08 NOTE — Preoperative (Signed)
Beta Blockers   Reason not to administer Beta Blockers:beta blocker taken this am 

## 2012-03-08 NOTE — H&P (Signed)
HISTORY AND PHYSICAL  Gabriella Leon is a 56 y.o. female patient with CC: dental pain  No diagnosis found.  Past Medical History  Diagnosis Date  . Diabetes mellitus   . Hypertension   . Cirrhosis   . Hepatitis B antibody positive   . Ulcer     ?  [name] Acid reflux disease with ulcer   . Depression   . Helicobacter pylori ab+     July 2010, s/p treatment  . GERD (gastroesophageal reflux disease)   . Gallstones   . Anxiety   . Blood transfusion   . Hepatitis C     and B positive antibody    No current facility-administered medications for this encounter.   Facility-Administered Medications Ordered in Other Encounters  Medication Dose Route Frequency Provider Last Rate Last Dose  . lactated ringers infusion    Continuous PRN Malachi Pro, CRNA       No Known Allergies Active Problems:  * No active hospital problems. *   Vitals: Blood pressure 113/75, pulse 60, temperature 98.3 F (36.8 C), temperature source Oral, resp. rate 18, SpO2 99.00%. Lab results: Results for orders placed during the hospital encounter of 03/08/12 (from the past 24 hour(s))  GLUCOSE, CAPILLARY     Status: Abnormal   Collection Time   03/08/12  5:53 AM      Component Value Range   Glucose-Capillary 105 (*) 70 - 99 (mg/dL)  APTT     Status: Normal   Collection Time   03/08/12  6:07 AM      Component Value Range   aPTT 34  24 - 37 (seconds)  PROTIME-INR     Status: Abnormal   Collection Time   03/08/12  6:07 AM      Component Value Range   Prothrombin Time 16.0 (*) 11.6 - 15.2 (seconds)   INR 1.25  0.00 - 1.49   TYPE AND SCREEN     Status: Normal   Collection Time   03/08/12  6:10 AM      Component Value Range   ABO/RH(D) O NEG     Antibody Screen NEG     Sample Expiration 03/11/2012    ABO/RH     Status: Normal (Preliminary result)   Collection Time   03/08/12  6:10 AM      Component Value Range   ABO/RH(D) O NEG     Radiology Results: No results found. General  appearance: alert, cooperative, appears stated age, no distress and moderately obese Head: Normocephalic, without obvious abnormality, atraumatic Eyes: conjunctivae/corneas clear. PERRL, EOM's intact. Fundi benign. Ears: normal TM's and external ear canals both ears Nose: Nares normal. Septum midline. Mucosa normal. No drainage or sinus tenderness. Throat: Dental caries periodontal disease teeth #'s 1, 2, 3, 4, 5, 6, 76, 8, 9, 10, 11, 12, 13, 14, 15, 17, 20 Neck: no adenopathy, no carotid bruit, no JVD, supple, symmetrical, trachea midline and thyroid not enlarged, symmetric, no tenderness/mass/nodules Resp: clear to auscultation bilaterally Cardio: regular rate and rhythm, S1, S2 normal, no murmur, click, rub or gallop Extremities: extremities normal, atraumatic, no cyanosis or edema  Assessment:55 BF Cirrhosis, DM, HTN, Hep B, GERD, Depression with non-restorable teeth #'s 1, 2, 3, 4, 5, 6, 76, 8, 9, 10, 11, 12, 13, 14, 15, 17, 20  Plan: Extract teeth, alveoloplasty, general anesethesia. Possible need for platelets if bleeding difficulties during/after surgery. Plan same day discharge  if patient remains stable post-op.    Georgia Lopes 03/08/2012

## 2012-03-08 NOTE — Anesthesia Procedure Notes (Signed)
Procedure Name: Intubation Date/Time: 03/08/2012 7:44 AM Performed by: Malachi Pro Pre-anesthesia Checklist: Patient identified, Emergency Drugs available, Suction available, Patient being monitored and Timeout performed Patient Re-evaluated:Patient Re-evaluated prior to inductionOxygen Delivery Method: Circle system utilized Preoxygenation: Pre-oxygenation with 100% oxygen Intubation Type: IV induction Ventilation: Mask ventilation without difficulty Laryngoscope Size: Miller and 2 Grade View: Grade II Tube type: Oral Number of attempts: 1 Airway Equipment and Method: Stylet

## 2012-03-09 ENCOUNTER — Encounter (HOSPITAL_COMMUNITY): Payer: Self-pay | Admitting: Oral Surgery

## 2012-03-09 LAB — HEPATITIS C GENOTYPE

## 2012-03-29 ENCOUNTER — Other Ambulatory Visit: Payer: Self-pay | Admitting: Gastroenterology

## 2012-03-29 ENCOUNTER — Telehealth: Payer: Self-pay | Admitting: Gastroenterology

## 2012-03-29 ENCOUNTER — Other Ambulatory Visit: Payer: Self-pay

## 2012-03-29 DIAGNOSIS — R7989 Other specified abnormal findings of blood chemistry: Secondary | ICD-10-CM

## 2012-03-29 NOTE — Telephone Encounter (Signed)
LMOM to call back

## 2012-03-29 NOTE — Telephone Encounter (Signed)
Can we find out what she is talking about? We don't manage BP meds. HOWEVER, she should be taking Inderal as she has esophageal varices.

## 2012-03-29 NOTE — Telephone Encounter (Signed)
Pt called in asking which of her two blood pressure meds should she take - she can be reached at [phone]

## 2012-03-29 NOTE — Progress Notes (Signed)
Quick Note:  Iron and ferritin continue to remain elevated. Pt with cirrhosis, Hep C. I'd like to evaluate for hemochromatosis for completeness' sake. I will order labs, please have pt have done in near future. Thanks!  ______

## 2012-03-30 MED ORDER — OMEPRAZOLE 20 MG PO CPDR: 20.0000 mg | DELAYED_RELEASE_CAPSULE | Freq: Two times a day (BID) | ORAL | Status: DC

## 2012-03-30 NOTE — Progress Notes (Signed)
Spoke with pt. She will go by lab this week to have blood drawn.

## 2012-03-30 NOTE — Progress Notes (Signed)
Quick Note:  Spoke with pt. Aware to have blood drawn. Order to be faxed to lab.  Also informed to take inderal as prescribed. Other BP meds per her PCP. ______

## 2012-03-31 NOTE — Progress Notes (Signed)
Quick Note:  Lab order faxed to lab. ______

## 2012-03-31 NOTE — Telephone Encounter (Signed)
Gabriella Leon spoke with pt

## 2012-06-30 ENCOUNTER — Telehealth: Payer: Self-pay

## 2012-06-30 NOTE — Telephone Encounter (Signed)
I told the Pt that we could not in the non GI medication. She will call the drug store for the refills on the Prilosec and inderal. She is going to also call the health department for the other medication.

## 2012-06-30 NOTE — Telephone Encounter (Signed)
Patient has not been seen since 01/2012. Need more information about which medications she needs.   She should have refills on her inderal and prilosec at [facility] drug. She can call them directly for those. We will not provide medications not GI related.  She needs OV as she has pending tests that have not been done.

## 2012-06-30 NOTE — Telephone Encounter (Signed)
Pt called wanting to know if we could call in all of her medication because she left them in [place]. I told her she would have to call her PCP to get them because we can not call in medication that we did not give her. She stated that she does not have a PCP and that is why she came her. She goes to [facility]. Please advise. Her number is [phone]

## 2012-07-01 ENCOUNTER — Ambulatory Visit: Payer: Medicaid Other | Admitting: Gastroenterology

## 2012-08-05 ENCOUNTER — Ambulatory Visit: Payer: Medicaid Other | Admitting: Gastroenterology

## 2012-08-25 ENCOUNTER — Encounter: Payer: Self-pay | Admitting: Internal Medicine

## 2012-08-26 ENCOUNTER — Ambulatory Visit: Payer: Medicaid Other | Admitting: Gastroenterology

## 2012-08-26 ENCOUNTER — Telehealth: Payer: Self-pay | Admitting: Gastroenterology

## 2012-08-26 NOTE — Telephone Encounter (Signed)
Pt was a no show

## 2012-09-02 ENCOUNTER — Ambulatory Visit: Payer: Medicaid Other | Admitting: Gastroenterology

## 2012-09-08 ENCOUNTER — Ambulatory Visit (INDEPENDENT_AMBULATORY_CARE_PROVIDER_SITE_OTHER): Payer: Medicaid Other | Admitting: Gastroenterology

## 2012-09-08 ENCOUNTER — Encounter: Payer: Self-pay | Admitting: Gastroenterology

## 2012-09-08 VITALS — BP 150/89 | HR 85 | Temp 98.3°F | Ht 64.0 in | Wt 206.2 lb

## 2012-09-08 DIAGNOSIS — K7460 Unspecified cirrhosis of liver: Secondary | ICD-10-CM

## 2012-09-08 DIAGNOSIS — R109 Unspecified abdominal pain: Secondary | ICD-10-CM

## 2012-09-08 LAB — COMPREHENSIVE METABOLIC PANEL
Alkaline Phosphatase: 294 U/L — ABNORMAL HIGH (ref 39–117)
BUN: 11 mg/dL (ref 6–23)
Glucose, Bld: 110 mg/dL — ABNORMAL HIGH (ref 70–99)
Total Bilirubin: 1.4 mg/dL — ABNORMAL HIGH (ref 0.3–1.2)

## 2012-09-08 LAB — CBC WITH DIFFERENTIAL/PLATELET
Basophils Relative: 1 % (ref 0–1)
Eosinophils Absolute: 0.1 10*3/uL (ref 0.0–0.7)
Eosinophils Relative: 5 % (ref 0–5)
HCT: 33.6 % — ABNORMAL LOW (ref 36.0–46.0)
Hemoglobin: 11.3 g/dL — ABNORMAL LOW (ref 12.0–15.0)
MCH: 35 pg — ABNORMAL HIGH (ref 26.0–34.0)
MCHC: 33.6 g/dL (ref 30.0–36.0)
Monocytes Absolute: 0.3 10*3/uL (ref 0.1–1.0)
Monocytes Relative: 11 % (ref 3–12)

## 2012-09-08 LAB — PROTIME-INR
INR: 1.23 (ref <1.50)
Prothrombin Time: 15.3 seconds — ABNORMAL HIGH (ref 11.6–15.2)

## 2012-09-08 MED ORDER — LACTULOSE 10 GM/15ML PO SOLN: 20.0000 g | Freq: Three times a day (TID) | ORAL | Status: DC

## 2012-09-08 NOTE — Patient Instructions (Addendum)
Please have blood work completed. I have also set you up for an ultrasound of your belly.   Start taking lactulose 2-3 times per day. The goal is to have 2-3 soft bowel movements a day. If you start having diarrhea, it is too much.   Stop drinking. It is extremely important for the health of your liver.   Once the blood work is complete, we will decide on the best medication for you to help with swelling. Please review the low salt (sodium) diet. It is important that you follow this to decrease fluid retention.   We will see you back in 4 weeks.     Cirrhosis Cirrhosis is a condition of scarring of the liver which is caused when the liver has tried repairing itself following damage. This damage may come from a previous infection such as one of the forms of hepatitis (usually hepatitis C), or the damage may come from being injured by toxins. The main toxin that causes this damage is alcohol. The scarring of the liver from use of alcohol is irreversible. That means the liver cannot return to normal even though alcohol is not used any more. The main danger of hepatitis C infection is that it may cause long-lasting (chronic) liver disease, and this also may lead to cirrhosis. This complication is progressive and irreversible. CAUSES   Prior to available blood tests, hepatitis C could be contracted by blood transfusions. Since testing of blood has improved, this is now unlikely. This infection can also be contracted through intravenous drug use and the sharing of needles. It can also be contracted through sexual relationships. The injury caused by alcohol comes from too much use. It is not a few drinks that poison the liver, but years of misuse. Usually there will be some signs and symptoms early with scarring of the liver that suggest the development of better habits. Alcohol should never be used while using acetaminophen. A small dose of both taken together may cause irreversible damage to the  liver. HOME CARE INSTRUCTIONS   There is no specific treatment for cirrhosis. However, there are things you can do to avoid making the condition worse.  Rest as needed.   Eat a well-balanced diet. Your caregiver can help you with suggestions.   Vitamin supplements including vitamins A, K, D, and thiamine can help.   A low-salt diet, water restriction, or diuretic medicine may be needed to reduce fluid retention.   Avoid alcohol. This can be extremely toxic if combined with acetaminophen.   Avoid drugs which are toxic to the liver. Some of these include isoniazid, methyldopa, acetaminophen, anabolic steroids (muscle-building drugs), erythromycin, and oral contraceptives (birth control pills). Check with your caregiver to make sure medicines you are presently taking will not be harmful.   Periodic blood tests may be required. Follow your caregiver's advice regarding the timing of these.   Milk thistle is an herbal remedy which does protect the liver against toxins. However, it will not help once the liver has been scarred.  SEEK MEDICAL CARE IF:  You have increasing fatigue or weakness.   You develop swelling of the hands, feet, legs, or face.   You vomit bright red blood, or a coffee ground appearing material.   You have blood in your stools, or the stools turn black and tarry.   You have a fever.   You develop loss of appetite, or have nausea and vomiting.   You develop jaundice.   You develop easy bruising or  bleeding.   You have worsening of any of the problems you are concerned about.  Document Released: 12/08/2005 Document Revised: 11/27/2011 Document Reviewed: 07/26/2008 [facility] Patient Information 2012 [place], [place].    2 Gram Low Sodium Diet A 2 gram sodium diet restricts the amount of sodium in the diet to no more than 2 g or 2000 mg daily. Limiting the amount of sodium is often used to help lower blood pressure. It is important if you have heart, liver, or  kidney problems. Many foods contain sodium for flavor and sometimes as a preservative. When the amount of sodium in a diet needs to be low, it is important to know what to look for when choosing foods and drinks. The following includes some information and guidelines to help make it easier for you to adapt to a low sodium diet. QUICK TIPS  Do not add salt to food.   Avoid convenience items and fast food.   Choose unsalted snack foods.   Buy lower sodium products, often labeled as "lower sodium" or "no salt added."   Check food labels to learn how much sodium is in 1 serving.   When eating at a restaurant, ask that your food be prepared with less salt or none, if possible.  READING FOOD LABELS FOR SODIUM INFORMATION The nutrition facts label is a good place to find how much sodium is in foods. Look for products with no more than 500 to 600 mg of sodium per meal and no more than 150 mg per serving. Remember that 2 g = 2000 mg. The food label may also list foods as:  Sodium-free: Less than 5 mg in a serving.   Very low sodium: 35 mg or less in a serving.   Low-sodium: 140 mg or less in a serving.   Light in sodium: 50% less sodium in a serving. For example, if a food that usually has 300 mg of sodium is changed to become light in sodium, it will have 150 mg of sodium.   Reduced sodium: 25% less sodium in a serving. For example, if a food that usually has 400 mg of sodium is changed to reduced sodium, it will have 300 mg of sodium.  CHOOSING FOODS Grains  Avoid: Salted crackers and snack items. Some cereals, including instant hot cereals. Bread stuffing and biscuit mixes. Seasoned rice or pasta mixes.   Choose: Unsalted snack items. Low-sodium cereals, oats, puffed wheat and rice, shredded wheat. English muffins and bread. Pasta.  Meats  Avoid: Salted, canned, smoked, spiced, pickled meats, including fish and poultry. Bacon, ham, sausage, cold cuts, hot dogs, anchovies.   Choose:  Low-sodium canned tuna and salmon. Fresh or frozen meat, poultry, and fish.  Dairy  Avoid: Processed cheese and spreads. Cottage cheese. Buttermilk and condensed milk. Regular cheese.   Choose: Milk. Low-sodium cottage cheese. Yogurt. Sour cream. Low-sodium cheese.  Fruits and Vegetables  Avoid: Regular canned vegetables. Regular canned tomato sauce and paste. Frozen vegetables in sauces. Olives. Rosita Fire. Relishes. Sauerkraut.   Choose: Low-sodium canned vegetables. Low-sodium tomato sauce and paste. Frozen or fresh vegetables. Fresh and frozen fruit.  Condiments  Avoid: Canned and packaged gravies. Worcestershire sauce. Tartar sauce. Barbecue sauce. Soy sauce. Steak sauce. Ketchup. Onion, garlic, and table salt. Meat flavorings and tenderizers.   Choose: Fresh and dried herbs and spices. Low-sodium varieties of mustard and ketchup. Lemon juice. Tabasco sauce. Horseradish.  SAMPLE 2 GRAM SODIUM MEAL PLAN Breakfast / Sodium (mg)  1 cup low-fat milk /  143 mg   2 slices whole-wheat toast / 270 mg   1 tbs heart-healthy margarine / 153 mg   1 hard-boiled egg / 139 mg   1 small orange / 0 mg  Lunch / Sodium (mg)  1 cup raw carrots / 76 mg    cup hummus / 298 mg   1 cup low-fat milk / 143 mg    cup red grapes / 2 mg   1 whole-wheat pita bread / 356 mg  Dinner / Sodium (mg)  1 cup whole-wheat pasta / 2 mg   1 cup low-sodium tomato sauce / 73 mg   3 oz lean ground beef / 57 mg   1 small side salad (1 cup raw spinach leaves,  cup cucumber,  cup yellow bell pepper) with 1 tsp olive oil and 1 tsp red wine vinegar / 25 mg  Snack / Sodium (mg)  1 container low-fat vanilla yogurt / 107 mg   3 graham cracker squares / 127 mg  Nutrient Analysis  Calories: 2033   Protein: 77 g   Carbohydrate: 282 g   Fat: 72 g   Sodium: 1971 mg  Document Released: 12/08/2005 Document Revised: 11/27/2011 Document Reviewed: 03/11/2010 [facility] Patient Information 2012 [place].

## 2012-09-08 NOTE — Progress Notes (Signed)
Referring Provider: Tylene Fantasia., PA Primary Care Physician:  Tylene Fantasia., PA Gastroenterologist: Dr. Jena Gauss   Chief Complaint  Patient presents with  . Constipation  . Abdominal Pain  . Bloated    HPI:   56 year old with Hep C cirrhosis. +RNA. EGD for varices on file from Dec 2012. Noted Grade III varices, needs surveillance June 2014. On Inderal. Was referred for consideration of treatment at Hep C clinic but unable to afford copay (70$). Getting Hep A vaccine at [facility] in [place].   States lower extremity swelling, Abdomen feels swollen. Up 14 lbs since Feb. Notes RUQ pain. X 2 weeks, comes and goes. Feels like an ache. Worse with eating per pt. Feels like needles sticking in her arms and back. +pruritis "all over". About to "run me crazy". Itching is CHRONIC. No jaundice. BM twice a day but "hard". Still a beer sometimes. Doesn't drink every day. Usually on the weekends. Will drink 2 beers on weekend. No drug use.  Good appetite.    Past Medical History  Diagnosis Date  . Diabetes mellitus   . Hypertension   . Cirrhosis   . Hepatitis B antibody positive   . Ulcer     ?  [name] Acid reflux disease with ulcer   . Depression   . Helicobacter pylori ab+     July 2010, s/p treatment  . GERD (gastroesophageal reflux disease)   . Gallstones   . Anxiety   . Blood transfusion   . Hepatitis C     and B positive antibody    Past Surgical History  Procedure Date  . Cesarean section 1988    [facility]  . Cholecystectomy greater than 10 yrs    [facility]  . Appendectomy     age 20  . Esophageal biopsy 12/18/2011    Esophageal varices. Distal esophageal erosions consistent with erosive reflux esophagitis Portal gastropathy / Hiatal hernia/ Antral and duodenal  erosions-status post gastric biopsy  . Colonoscopy 12/18/11    minimal anal canal hemorrhoids, friable rectal and colonic mucosa, left-sided diverticulosis, repeat in 2022.   [name] Esophagogastroduodenoscopy 12/18/11    3  columns of Grade III esophageal varices, reflux esohpagitis, portal gastropathy, path with chronic gastritis, negative H.pylori, surveillance in June 2014  . Multiple extractions with alveoloplasty 03/08/2012    Procedure: MULTIPLE EXTRACION WITH ALVEOLOPLASTY;  Surgeon: Georgia Lopes, DDS;  Location: [facility] OR;  Service: Oral Surgery;  Laterality: N/A;    Current Outpatient Prescriptions  Medication Sig Dispense Refill  . cyclobenzaprine (FLEXERIL) 10 MG tablet Take 10 mg by mouth every 8 (eight) hours as needed. For muscle spasms      . folic acid (FOLVITE) 1 MG tablet Take 1 mg by mouth daily.        [name] glimepiride (AMARYL) 2 MG tablet Take 2 mg by mouth daily.       [name] ibuprofen (ADVIL,MOTRIN) 200 MG tablet Take 400 mg by mouth every 6 (six) hours as needed. For pain       . lisinopril-hydrochlorothiazide (PRINZIDE,ZESTORETIC) 10-12.5 MG per tablet Take 1 tablet by mouth daily.      . Multiple Vitamin (MULITIVITAMIN WITH MINERALS) TABS Take 1 tablet by mouth daily.        [name] omeprazole (PRILOSEC) 20 MG capsule Take 1 capsule (20 mg total) by mouth 2 (two) times daily. 30 min before meals  60 capsule  5  . propranolol (INDERAL LA) 60 MG 24 hr capsule Take 1 capsule (60 mg total)  by mouth daily.  30 capsule  11    Allergies as of 09/08/2012  . (No Known Allergies)    Family History  Problem Relation Age of Onset  . Diabetes Mother   . Cancer Paternal Uncle     passed away age 54  . Anesthesia problems Neg Hx   . Hypotension Neg Hx   . Malignant hyperthermia Neg Hx   . Pseudochol deficiency Neg Hx     History   Social History  . Marital Status: Legally Separated    Spouse Name: N/A    Number of Children: N/A  . Years of Education: N/A   Social History Main Topics  . Smoking status: Current Every Day Smoker -- 0.5 packs/day for 20 years    Types: Cigarettes  . Smokeless tobacco: Never Used  . Alcohol Use: 3.0 oz/week    5 Shots of liquor per week     1/2 pint gin daily, patient  states "has  cut back alot"  . Drug Use: No  . Sexually Active: Yes    Birth Control/ Protection: Post-menopausal   Other Topics Concern  . None   Social History Narrative  . None    Review of Systems: Gen: Denies fever, chills, anorexia. Denies fatigue, weakness, weight loss.  CV: + occasional palpitations Resp: +DOE GI: Denies vomiting blood, jaundice, and fecal incontinence.   Denies dysphagia or odynophagia. Derm: +pruritis Psych: +depression/anxiety Heme: Denies bruising, bleeding, and enlarged lymph nodes.  Physical Exam: BP 150/89  Pulse 85  Temp 98.3 F (36.8 C) (Temporal)  Ht 5\' 4"  (1.626 m)  Wt 206 lb 3.2 oz (93.532 kg)  BMI 35.39 kg/m2 General:   Alert and oriented. No distress noted. Pleasant and cooperative.  Head:  Normocephalic and atraumatic. Eyes:  Conjuctiva clear without scleral icterus. Mouth:  Oral mucosa pink and moist. Good dentition. No lesions. Neck:  Supple, without mass or thyromegaly. Heart:  S1, S2 present without murmurs, rubs, or gallops. Regular rate and rhythm. Abdomen:  +BS, soft, TTP epigastric, RUQ and non-distended. No rebound or guarding. Non-tense. ?small amount non-tense ascites.  Msk:  Symmetrical without gross deformities. Normal posture. Pulses:  2+ DP noted bilaterally Extremities:  Trace pedal edema Neurologic:  Alert and  oriented x4;  grossly normal neurologically. Skin:  Intact without significant lesions or rashes. Cervical Nodes:  No significant cervical adenopathy. Psych:  Alert and cooperative. Normal mood and affect.

## 2012-09-09 NOTE — Assessment & Plan Note (Signed)
56 year old female with likely HCV cirrhosis. Referred to Hep C clinic but was unable to afford co-pay. Continues to drink intermittently. Question if she is truly honest about amount of alcohol. Unfortunately, appears to have developed lower extremity edema, possible ascites noted on physical exam. Central pattern obesity, so it is somewhat difficult to appreciate. Also notes itching "all over" and sensation of "needles" on skin. Pruritis is chronic. I'm concerned she may be decompensating, specifically with continued ETOH use and current symptoms. Abdominal pain likely r/t gastritis, ETOH use, current liver disease. Needs updated labs and [place] of abdomen. As of note, ferritin has continued to be moderately elevated, which could be secondary to chronic HCV. Evaluate for hemochromatosis although unlikely, for completeness' sake. Pt admonished to abstain from ETOH. Discussed potential debilitating and life-threatening complications if she continues.   [place] abd AFP, PT/INR, CMP, CBC, hemochromatosis DNA-PCR, B1 and B12 (due to hx ETOH and current symptoms) Lactulose ordered: due to episodes of absent-mindedness, with secondary effect of constipation relief Continue Inderal Low-dose diuretics after review of labs ETOH cessation 2g Na diet Variceal surveillance June 2014 OV 4 weeks

## 2012-09-09 NOTE — Progress Notes (Signed)
Quick Note:  Thrombocytopenia in the setting of known cirrhosis.  MELD 10. DF 18 with control factor of 11.6, 2 with control factor of 15.2. AFP slightly above normal, awaiting [place]. Continues to drink, +HCV cirrhosis. Awaiting B1, hemochromatosis, and B12 before further recommendations. ______

## 2012-09-09 NOTE — Progress Notes (Signed)
Faxed to PCP

## 2012-09-09 NOTE — Assessment & Plan Note (Signed)
Unclear etiology, question r/t hx gastritis, ETOH use, underlying constipation. Labs and [place] ordered.

## 2012-09-11 LAB — VITAMIN B1: Vitamin B1 (Thiamine): 12 nmol/L (ref 8–30)

## 2012-09-14 ENCOUNTER — Other Ambulatory Visit (HOSPITAL_COMMUNITY): Payer: Medicaid Other

## 2012-09-17 ENCOUNTER — Ambulatory Visit (HOSPITAL_COMMUNITY)
Admission: RE | Admit: 2012-09-17 | Discharge: 2012-09-17 | Disposition: A | Discharge status: Home / Self Care | Payer: Medicaid Other | Source: Ambulatory Visit | Attending: Gastroenterology | Admitting: Gastroenterology

## 2012-09-17 DIAGNOSIS — K7460 Unspecified cirrhosis of liver: Secondary | ICD-10-CM

## 2012-09-17 DIAGNOSIS — R1011 Right upper quadrant pain: Secondary | ICD-10-CM | POA: Insufficient documentation

## 2012-09-20 ENCOUNTER — Other Ambulatory Visit: Payer: Self-pay | Admitting: Gastroenterology

## 2012-09-20 MED ORDER — FUROSEMIDE 20 MG PO TABS: 20.0000 mg | ORAL_TABLET | Freq: Every day | ORAL | Status: DC

## 2012-09-20 NOTE — Progress Notes (Signed)
Quick Note:  Update: Only sending lasix. Holding off on aldactone for right now. ______

## 2012-09-20 NOTE — Progress Notes (Signed)
Quick Note:  B1 low normal, B12 normal. Negative hemochromatosis.   Please have pt start taking MVI with thiamin.  STOP ALCOHOL.  Repeat AFP in 3 months.   I am sending a prescription for lasix and aldactone to her pharmacy. Very low dose. Come see Korea in 2-4 weeks. Needs BMP 1 week after starting lasix, aldactone. ______

## 2012-09-21 ENCOUNTER — Other Ambulatory Visit: Payer: Self-pay

## 2012-09-21 ENCOUNTER — Other Ambulatory Visit: Payer: Self-pay | Admitting: Gastroenterology

## 2012-09-21 DIAGNOSIS — K7460 Unspecified cirrhosis of liver: Secondary | ICD-10-CM

## 2012-09-21 NOTE — Progress Notes (Signed)
Quick Note:  Pt aware, lab order in mail to pt for BMP, lab order on file for AFP Darl Pikes, please schedule pt ov in 2-4 weeks. ______

## 2012-09-28 LAB — BASIC METABOLIC PANEL
Chloride: 101 mEq/L (ref 96–112)
Potassium: 3.9 mEq/L (ref 3.5–5.3)
Sodium: 139 mEq/L (ref 135–145)

## 2012-10-06 ENCOUNTER — Ambulatory Visit: Payer: Medicaid Other | Admitting: Gastroenterology

## 2012-10-06 ENCOUNTER — Telehealth: Payer: Self-pay | Admitting: *Deleted

## 2012-10-06 NOTE — Telephone Encounter (Signed)
Pt was a no show

## 2012-10-06 NOTE — Progress Notes (Signed)
Quick Note:  On 20 mg Lasix. Will be seen in clinic today. Recheck ______

## 2012-10-07 NOTE — Telephone Encounter (Signed)
I have attempted to contact pt on all 3 numbers available. The one labeled "mobile" was the only one with messaging service. I left a message to call back.   I'd like to get one more BMP. She is taking lasix, and I want to make sure her kidney function and potassium are ok. Also need to know how she is doing overall and see her physically in clinic.   May need to send letter.

## 2012-10-11 ENCOUNTER — Other Ambulatory Visit: Payer: Self-pay | Admitting: Gastroenterology

## 2012-10-11 ENCOUNTER — Other Ambulatory Visit: Payer: Self-pay

## 2012-10-11 DIAGNOSIS — K7460 Unspecified cirrhosis of liver: Secondary | ICD-10-CM

## 2012-10-11 NOTE — Telephone Encounter (Signed)
Mailed letter and lab order to pt. 

## 2012-10-19 NOTE — Telephone Encounter (Signed)
I was able to reach patient. She is unable to have blood work done until Thursday. i have asked her to take lasix every other day until this time. She will be seeing me Thursday at 10:30.

## 2012-10-21 ENCOUNTER — Encounter: Payer: Self-pay | Admitting: Gastroenterology

## 2012-10-21 ENCOUNTER — Ambulatory Visit (INDEPENDENT_AMBULATORY_CARE_PROVIDER_SITE_OTHER): Payer: Medicaid Other | Admitting: Gastroenterology

## 2012-10-21 VITALS — BP 114/72 | HR 96 | Temp 97.2°F | Ht 65.0 in | Wt 209.0 lb

## 2012-10-21 DIAGNOSIS — K7460 Unspecified cirrhosis of liver: Secondary | ICD-10-CM

## 2012-10-21 LAB — BASIC METABOLIC PANEL
CO2: 25 mEq/L (ref 19–32)
Glucose, Bld: 86 mg/dL (ref 70–99)
Potassium: 3.8 mEq/L (ref 3.5–5.3)
Sodium: 136 mEq/L (ref 135–145)

## 2012-10-21 MED ORDER — LACTULOSE 10 GM/15ML PO SOLN: 20.0000 g | Freq: Two times a day (BID) | ORAL | Status: DC

## 2012-10-21 NOTE — Patient Instructions (Addendum)
Please have blood work completed.  Take Lactulose 30 cc 2-3 times per day for a goal of 2-3 soft bowel movements a day.  We will see you back in 3 months! I will be calling you with your results soon.   Congratulations on stopping the alcohol!! Keep up the good work. Also, avoid snacking in between meals. We can refer you to a nutritionist if you would like. Please let us know if you decide to do this.

## 2012-10-21 NOTE — Progress Notes (Signed)
Referring Provider: Tylene Fantasia., PA Primary Care Physician:  Tylene Fantasia., PA Primary Gastroenterologist: Dr. Jena Gauss   Chief Complaint  Patient presents with  . Follow-up    HPI:   56 year old with Hep C cirrhosis. +RNA. EGD for varices on file from Dec 2012. Noted Grade III varices, needs surveillance June 2014. On Inderal. Was referred for consideration of treatment at Hep C clinic but unable to afford copay (70$). [facility] [place] tomorrow per pt. Recently started on low dose lasix (20 mg daily) at last visit in Sept. Most recent BMP with marginal increase in Cr. Had asked pt to have redrawn and take Lasix every other day. Obtaining BMP today.   No drink in 2 weeks. Used to drink every day, about a pint sometimes. BM 2 times per day. Hard stool. Not taking Lactulose, states never got it. Lower back hurting, stands up for 5 minutes then has to sit down. X a few weeks. No urinary symptoms. No mental status changes. No fever, chills. Notes upper belly discomfort, comes and goes. CHRONIC.   Continues to gain weight. No lower extremity edema swelling. Only eating twice a day, fried foods, potatoes. Snacking a lot. Likes Reeses candy.   Past Medical History  Diagnosis Date  . Diabetes mellitus   . Hypertension   . Cirrhosis   . Hepatitis B antibody positive   . Ulcer     ?  [name] Acid reflux disease with ulcer   . Depression   . Helicobacter pylori ab+     July 2010, s/p treatment  . GERD (gastroesophageal reflux disease)   . Gallstones   . Anxiety   . Blood transfusion   . Hepatitis C     and B positive antibody    Past Surgical History  Procedure Date  . Cesarean section 1988    [facility]  . Cholecystectomy greater than 10 yrs    [facility]  . Appendectomy     age 3  . Esophageal biopsy 12/18/2011    Esophageal varices. Distal esophageal erosions consistent with erosive reflux esophagitis Portal gastropathy / Hiatal hernia/ Antral and duodenal   erosions-status post gastric biopsy  . Colonoscopy 12/18/11    minimal anal canal hemorrhoids, friable rectal and colonic mucosa, left-sided diverticulosis, repeat in 2022.   [name] Esophagogastroduodenoscopy 12/18/11    3 columns of Grade III esophageal varices, reflux esohpagitis, portal gastropathy, path with chronic gastritis, negative H.pylori, surveillance in June 2014  . Multiple extractions with alveoloplasty 03/08/2012    Procedure: MULTIPLE EXTRACION WITH ALVEOLOPLASTY;  Surgeon: Georgia Lopes, DDS;  Location: [facility] OR;  Service: Oral Surgery;  Laterality: N/A;    Current Outpatient Prescriptions  Medication Sig Dispense Refill  . cyclobenzaprine (FLEXERIL) 10 MG tablet Take 10 mg by mouth every 8 (eight) hours as needed. For muscle spasms      . folic acid (FOLVITE) 1 MG tablet Take 1 mg by mouth daily.        . furosemide (LASIX) 20 MG tablet Take 1 tablet (20 mg total) by mouth daily.  30 tablet  1  . glimepiride (AMARYL) 2 MG tablet Take 2 mg by mouth daily.       [name] ibuprofen (ADVIL,MOTRIN) 200 MG tablet Take 400 mg by mouth every 6 (six) hours as needed. For pain       . lactulose (CHRONULAC) 10 GM/15ML solution Take 30 mLs (20 g total) by mouth 3 (three) times daily. 2-3 times per  day, goal of 2-3 soft bowel movements a day  500 mL  3  . lisinopril-hydrochlorothiazide (PRINZIDE,ZESTORETIC) 10-12.5 MG per tablet Take 1 tablet by mouth daily.      . Multiple Vitamin (MULITIVITAMIN WITH MINERALS) TABS Take 1 tablet by mouth daily.        [name] omeprazole (PRILOSEC) 20 MG capsule Take 1 capsule (20 mg total) by mouth 2 (two) times daily. 30 min before meals  60 capsule  5  . propranolol (INDERAL LA) 60 MG 24 hr capsule Take 1 capsule (60 mg total) by mouth daily.  30 capsule  11    Allergies as of 10/21/2012  . (No Known Allergies)    Family History  Problem Relation Age of Onset  . Diabetes Mother   . Cancer Paternal Uncle     passed away age 26  . Anesthesia problems Neg Hx   .  Hypotension Neg Hx   . Malignant hyperthermia Neg Hx   . Pseudochol deficiency Neg Hx     History   Social History  . Marital Status: Legally Separated    Spouse Name: N/A    Number of Children: N/A  . Years of Education: N/A   Social History Main Topics  . Smoking status: Current Every Day Smoker -- 0.5 packs/day for 20 years    Types: Cigarettes  . Smokeless tobacco: Never Used  . Alcohol Use: 3.0 oz/week    5 Shots of liquor per week     1/2 pint gin daily, patient states "has  cut back alot"  . Drug Use: No  . Sexually Active: Yes    Birth Control/ Protection: Post-menopausal   Other Topics Concern  . None   Social History Narrative  . None    Review of Systems: Gen: Denies fever, chills, anorexia. Denies fatigue, weakness, weight loss.  CV: Denies chest pain, palpitations, syncope, peripheral edema, and claudication. Resp: Denies dyspnea at rest, cough, wheezing, coughing up blood, and pleurisy. GI: Denies vomiting blood, jaundice, and fecal incontinence.   Denies dysphagia or odynophagia. Derm: Denies rash, itching, dry skin Psych: Denies depression, anxiety, memory loss, confusion. No homicidal or suicidal ideation.  Heme: Denies bruising, bleeding, and enlarged lymph nodes.  Physical Exam: BP 114/72  Pulse 96  Temp 97.2 F (36.2 C) (Temporal)  Ht 5\' 5"  (1.651 m)  Wt 209 lb (94.802 kg)  BMI 34.78 kg/m2 General:   Alert and oriented. No distress noted. Pleasant and cooperative.  Head:  Normocephalic and atraumatic. Eyes:  Conjuctiva clear without scleral icterus. Mouth:  Oral mucosa pink and moist. Good dentition. No lesions. Abdomen:  +BS, soft, non-tender and non-distended. No rebound or guarding. No HSM or masses noted. Msk:  Symmetrical without gross deformities. Normal posture. Extremities:  Without edema. Neurologic:  Alert and  oriented x4;  grossly normal neurologically. Skin:  Intact without significant lesions or rashes. Psych:  Alert and  cooperative. Normal mood and affect.

## 2012-10-21 NOTE — Progress Notes (Signed)
Faxed to PCP

## 2012-10-21 NOTE — Assessment & Plan Note (Signed)
HCV cirrhosis. Unable to afford copay at Hep C clinic. Has abstained from ETOH for last 2 weeks. Lower extremity edema resolved since Lasix 20 mg. Currently just taking every other day due to marginal increase of Cr. Rechecking today. Likely bloating, increased abdominal girth secondary to adiposity; pt eating fried foods, starches, and snacking on reeses cups. [place] on file, no mention of ascites. Will need to review with radiologist. AFP mildly elevated above normal at last visit, due for recheck in a few months, on file.   Offered nutritionist, pt declined Continue ETOH cessation 2g Na diet BMP today OV 3 mos EGD for surveillance in June 2014

## 2012-10-25 ENCOUNTER — Ambulatory Visit: Payer: Medicaid Other | Admitting: Gastroenterology

## 2012-10-26 NOTE — Progress Notes (Signed)
Quick Note:  Have attempted to call patient on all 3 numbers listed. Needs to stop Lasix.  Please try to contact as well; maybe between the two of [place], we can reach her. Thanks!  ______

## 2012-11-02 ENCOUNTER — Other Ambulatory Visit: Payer: Self-pay

## 2012-11-02 DIAGNOSIS — K7460 Unspecified cirrhosis of liver: Secondary | ICD-10-CM

## 2012-11-04 ENCOUNTER — Encounter: Payer: Self-pay | Admitting: Gastroenterology

## 2012-11-04 ENCOUNTER — Ambulatory Visit (INDEPENDENT_AMBULATORY_CARE_PROVIDER_SITE_OTHER): Payer: Medicaid Other | Admitting: Gastroenterology

## 2012-11-04 VITALS — BP 133/77 | HR 101 | Temp 98.4°F | Ht 65.0 in | Wt 209.2 lb

## 2012-11-04 DIAGNOSIS — R1011 Right upper quadrant pain: Secondary | ICD-10-CM

## 2012-11-04 DIAGNOSIS — K7460 Unspecified cirrhosis of liver: Secondary | ICD-10-CM

## 2012-11-04 LAB — HEPATIC FUNCTION PANEL
ALT: 30 U/L (ref 0–35)
AST: 68 U/L — ABNORMAL HIGH (ref 0–37)
Alkaline Phosphatase: 216 U/L — ABNORMAL HIGH (ref 39–117)
Indirect Bilirubin: 0.5 mg/dL (ref 0.0–0.9)
Total Protein: 7.7 g/dL (ref 6.0–8.3)

## 2012-11-04 NOTE — Progress Notes (Signed)
Spoke with Dr. Tyron Russell to verify absence of ascites on [place]. No ascites noted. Pt to be seen today, 11/14. CBD mildly dilated at 9mm, question of post-cholecystectomy state. To discuss further with patient at time of visit. If continued RUQ pain, will likely update HFP, consider MRCP?

## 2012-11-04 NOTE — Progress Notes (Signed)
Referring Provider: Tylene Fantasia., PA Primary Care Physician:  Tylene Fantasia., PA Primary GI: Dr. Jena Gauss   Chief Complaint  Patient presents with  . Abdominal Pain    HPI:   56 year old female with Hep C cirrhosis. +RNA. EGD for varices on file from Dec 2012. Noted Grade III varices, needs surveillance June 2014. Last TCS Dec 2012 with minimal anal canal hemorrhoids, friable rectal and colonic mucosa, left-sided diverticulosis, repeat 2022.  Hx of ETOH abuse, last visit on Oct 31st had stated abstinence X 2 weeks. Noted upper abdominal discomfort, intermittent, which is chronic. Was gaining weight, +lower extremity mild edema, started on lasix a few weeks ago but increased Cr mildly. Stopped Lasix now. Most recent US from Sept 2013 with CBD prominence at 9mm, likely r/t post-chole. Cirrhotic liver, no acute findings. Borderline splenomegaly. No ascites.   Presents saying RUQ pain is constant, feels like it is worse. Feels like a "knot". Feels like something is coming loose when she bends over. Right where "the cut" is. Doesn't hurt worse with eating/drinking. No N/V. Going on almost 4 weeks without ETOH. Trying to eat more fruits.    Last Meld 10, DF 18 in Sept 2013.    Past Medical History  Diagnosis Date  . Diabetes mellitus   . Hypertension   . Cirrhosis   . Hepatitis B antibody positive   . Ulcer     ?  [name] Acid reflux disease with ulcer   . Depression   . Helicobacter pylori ab+     July 2010, s/p treatment  . GERD (gastroesophageal reflux disease)   . Gallstones   . Anxiety   . Blood transfusion   . Hepatitis C     and B positive antibody    Past Surgical History  Procedure Date  . Cesarean section 1988    [facility]  . Cholecystectomy greater than 10 yrs    [facility]  . Appendectomy     age 82  . Esophageal biopsy 12/18/2011    Esophageal varices. Distal esophageal erosions consistent with erosive reflux esophagitis Portal gastropathy / Hiatal hernia/ Antral and  duodenal  erosions-status post gastric biopsy  . Colonoscopy 12/18/11    minimal anal canal hemorrhoids, friable rectal and colonic mucosa, left-sided diverticulosis, repeat in 2022.   [name] Esophagogastroduodenoscopy 12/18/11    3 columns of Grade III esophageal varices, reflux esohpagitis, portal gastropathy, path with chronic gastritis, negative H.pylori, surveillance in June 2014  . Multiple extractions with alveoloplasty 03/08/2012    Procedure: MULTIPLE EXTRACION WITH ALVEOLOPLASTY;  Surgeon: Georgia Lopes, DDS;  Location: [facility] OR;  Service: Oral Surgery;  Laterality: N/A;    Current Outpatient Prescriptions  Medication Sig Dispense Refill  . cyclobenzaprine (FLEXERIL) 10 MG tablet Take 10 mg by mouth every 8 (eight) hours as needed. For muscle spasms      . folic acid (FOLVITE) 1 MG tablet Take 1 mg by mouth daily.        . furosemide (LASIX) 20 MG tablet Take 1 tablet (20 mg total) by mouth daily.  30 tablet  1  . glimepiride (AMARYL) 2 MG tablet Take 2 mg by mouth daily.       [name] ibuprofen (ADVIL,MOTRIN) 200 MG tablet Take 400 mg by mouth every 6 (six) hours as needed. For pain       . lactulose (CHRONULAC) 10 GM/15ML solution Take 30 mLs (20 g total) by mouth 2 (two) times daily. May increase to three times a day. Needs 2-3  soft bowel movements a day.  500 mL  3  . lisinopril-hydrochlorothiazide (PRINZIDE,ZESTORETIC) 10-12.5 MG per tablet Take 1 tablet by mouth daily.      . Multiple Vitamin (MULITIVITAMIN WITH MINERALS) TABS Take 1 tablet by mouth daily.        [name] omeprazole (PRILOSEC) 20 MG capsule Take 1 capsule (20 mg total) by mouth 2 (two) times daily. 30 min before meals  60 capsule  5  . propranolol (INDERAL LA) 60 MG 24 hr capsule Take 1 capsule (60 mg total) by mouth daily.  30 capsule  11    Allergies as of 11/04/2012  . (No Known Allergies)    Family History  Problem Relation Age of Onset  . Diabetes Mother   . Cancer Paternal Uncle     passed away age 33  . Anesthesia  problems Neg Hx   . Hypotension Neg Hx   . Malignant hyperthermia Neg Hx   . Pseudochol deficiency Neg Hx     History   Social History  . Marital Status: Legally Separated    Spouse Name: N/A    Number of Children: N/A  . Years of Education: N/A   Social History Main Topics  . Smoking status: Current Every Day Smoker -- 0.5 packs/day for 20 years    Types: Cigarettes  . Smokeless tobacco: Never Used  . Alcohol Use: 3.0 oz/week    5 Shots of liquor per week     Comment: 1/2 pint gin daily, patient states "has  cut back alot"  . Drug Use: No  . Sexually Active: Yes    Birth Control/ Protection: Post-menopausal   Other Topics Concern  . None   Social History Narrative  . None    Review of Systems: Gen: Denies fever, chills, anorexia. Denies fatigue, weakness, weight loss.  CV: Denies chest pain, palpitations, syncope, peripheral edema, and claudication. Resp: Denies dyspnea at rest, cough, wheezing, coughing up blood, and pleurisy. GI: Denies vomiting blood, jaundice, and fecal incontinence.   Denies dysphagia or odynophagia. Derm: Denies rash, itching, dry skin Psych: Denies depression, anxiety, memory loss, confusion. No homicidal or suicidal ideation.  Heme: Denies bruising, bleeding, and enlarged lymph nodes.  Physical Exam: BP 133/77  Pulse 101  Temp 98.4 F (36.9 C) (Temporal)  Ht 5\' 5"  (1.651 m)  Wt 209 lb 3.2 oz (94.892 kg)  BMI 34.81 kg/m2 General:   Alert and oriented. No distress noted. Pleasant and cooperative.  Head:  Normocephalic and atraumatic. Eyes:  Conjuctiva clear without scleral icterus. Mouth:  Oral mucosa pink and moist. Good dentition. No lesions. Heart:  S1, S2 present without murmurs, rubs, or gallops. Regular rate and rhythm. Abdomen:  +BS, soft, mild TTP RUQ AT SURGICAL INCISION, TRANSVERSE. +CARNETT'S and non-distended. No rebound or guarding. Msk:  Symmetrical without gross deformities. Normal posture. Extremities:  Without  edema. Neurologic:  Alert and  oriented x4;  grossly normal neurologically. Skin:  Intact without significant lesions or rashes. Psych:  Alert and cooperative. Normal mood and affect.

## 2012-11-04 NOTE — Progress Notes (Signed)
Quick Note:  Direct bili elevated but Tbili normal.  AP better. AST/ALT about the same.  Last [place] in Sept 2013 with mild CBD dilation thought to be secondary to post-cholecystectomy state. Her symptoms are constant, no association with eating/drinking, no N/V. Pt without signs of distress at visit.  Unclear etiology at this point; however, due to mild dilation of CBD, may be best to schedule MRCP.  Please set up for pt; needs to seek medical attention if severe abdominal pain, inability to tolerate eating/drinking, severe nausea or vomiting. ______

## 2012-11-04 NOTE — Patient Instructions (Addendum)
Please have blood work done today. We will call you with the results and decide on proceeding with a scan if needed.   Increase Lactulose to three times a day, with a goal of 2-3 soft bowel movements a day.   3 months follow-up

## 2012-11-05 ENCOUNTER — Other Ambulatory Visit: Payer: Self-pay | Admitting: Gastroenterology

## 2012-11-05 DIAGNOSIS — R1011 Right upper quadrant pain: Secondary | ICD-10-CM

## 2012-11-05 NOTE — Progress Notes (Signed)
Patient is scheduled for MRCP on Tuesday Nov 19th at 8:00 and she is aware

## 2012-11-05 NOTE — Progress Notes (Signed)
Patient is aware 

## 2012-11-07 DIAGNOSIS — R1011 Right upper quadrant pain: Secondary | ICD-10-CM | POA: Insufficient documentation

## 2012-11-07 NOTE — Assessment & Plan Note (Signed)
Stable. Lower extremity edema resolved. With low-dose lasix had mild bump in Cr in past. Avoid for now, follow low Na diet. AFP, [place] up to date. Next EGD June 2014. Refer to Hep C clinic once abstained from ETOH for at least 6 mos.

## 2012-11-07 NOTE — Assessment & Plan Note (Signed)
56 year old with hx of HCV cirrhosis, +RNA, abstinence from ETOH X 4 weeks now. Applauded on efforts. Notes worsening of RUQ pain, states as constant, without N/V or associated with eating/drinking. Doubt hepatobiliary or pancreatic component. Question musculoskeletal origin. +Carnett's sign on physical exam. Will order stat LFTs today. Consider abdominal imaging due to historical dilated CBD, although this is likely benign finding in setting of post-cholecystectomy. Doubt microlithiasis due to constant symptoms. Proceed with stat LFTs, ?MRCP if indicated.

## 2012-11-08 ENCOUNTER — Telehealth: Payer: Self-pay | Admitting: Gastroenterology

## 2012-11-08 NOTE — Telephone Encounter (Signed)
Patient is scheduled with [facility] on 12/01/12 at 11:30 and she is aware

## 2012-11-08 NOTE — Progress Notes (Signed)
Faxed to PCP

## 2012-11-09 ENCOUNTER — Ambulatory Visit (HOSPITAL_COMMUNITY)
Admission: RE | Admit: 2012-11-09 | Discharge: 2012-11-09 | Disposition: A | Discharge status: Home / Self Care | Payer: Medicaid Other | Source: Ambulatory Visit | Attending: Gastroenterology | Admitting: Gastroenterology

## 2012-11-09 ENCOUNTER — Other Ambulatory Visit: Payer: Self-pay | Admitting: Gastroenterology

## 2012-11-09 DIAGNOSIS — R110 Nausea: Secondary | ICD-10-CM | POA: Insufficient documentation

## 2012-11-09 DIAGNOSIS — R1011 Right upper quadrant pain: Secondary | ICD-10-CM

## 2012-11-09 DIAGNOSIS — K7460 Unspecified cirrhosis of liver: Secondary | ICD-10-CM | POA: Insufficient documentation

## 2012-11-11 ENCOUNTER — Telehealth: Payer: Self-pay | Admitting: Internal Medicine

## 2012-11-11 NOTE — Telephone Encounter (Signed)
See result note.  

## 2012-11-11 NOTE — Telephone Encounter (Signed)
Routing to [facility] 

## 2012-11-11 NOTE — Progress Notes (Signed)
Quick Note:  No definite filling defect.  Likely CBD prominence secondary to post-chole. Her pain appears to be constant, located at incision. How is pain? ______

## 2012-11-11 NOTE — Telephone Encounter (Signed)
Pt was calling to see if her results from her MRI were available. Please call her at [phone]

## 2012-11-11 NOTE — Telephone Encounter (Signed)
Pt aware of results 

## 2012-11-12 NOTE — Progress Notes (Signed)
Quick Note:  Need to refer to pain management due to chronic abdominal pain. I will review with RMR to make sure I'm not overlooking anything. +Carnett's sign on exam, worsened with movement. Question adhesive disease. doubt hepatobiliary component at this point. ______

## 2012-11-15 NOTE — Progress Notes (Signed)
A referral was faxed over to Dr. Gerilyn Pilgrim and the office will call the patient to set up date & time of appointment

## 2012-11-23 ENCOUNTER — Telehealth: Payer: Self-pay | Admitting: *Deleted

## 2012-11-23 NOTE — Telephone Encounter (Signed)
Spoke with pt- she stated she was trying to get a home health aide to come and help her around the house for 3 hours a day. She said her back and legs are hurting and she is relating it to the cirrhosis and all the fluid and fluid pills. She wants to know if [facility] can write her a letter stating she needs some extra help. Please advise.

## 2012-11-23 NOTE — Telephone Encounter (Signed)
Ms Laural Benes called. She is trying to get home care through [facility] care. [phone].  Please follow up with her. Thanks.

## 2012-11-25 NOTE — Telephone Encounter (Signed)
She shouldn't be on any fluid pills anymore. Her Cr had bumped up when she was on just lasix.   Needs to ask her PCP for this recommendation. Back pain and leg pain shouldn't be r/t cirrhosis.   We can help out from GI standpoint, but it does not appear that is the issue here.

## 2012-11-30 NOTE — Telephone Encounter (Signed)
Tried to call pt- NA 

## 2012-12-02 NOTE — Telephone Encounter (Signed)
Tried to call pt- NA 

## 2012-12-07 NOTE — Telephone Encounter (Signed)
Pt aware.

## 2012-12-13 ENCOUNTER — Ambulatory Visit (HOSPITAL_COMMUNITY): Payer: Medicaid Other | Admitting: Oncology

## 2012-12-20 ENCOUNTER — Encounter: Payer: Self-pay | Admitting: *Deleted

## 2013-01-03 ENCOUNTER — Encounter (HOSPITAL_COMMUNITY): Payer: Self-pay

## 2013-01-11 ENCOUNTER — Ambulatory Visit (HOSPITAL_COMMUNITY): Payer: Medicaid Other | Admitting: Oncology

## 2013-01-11 ENCOUNTER — Encounter (HOSPITAL_COMMUNITY): Payer: Self-pay | Admitting: Oncology

## 2013-01-11 NOTE — Progress Notes (Signed)
This encounter was created in error - please disregard.

## 2013-01-18 ENCOUNTER — Other Ambulatory Visit: Payer: Self-pay

## 2013-01-18 DIAGNOSIS — K7460 Unspecified cirrhosis of liver: Secondary | ICD-10-CM

## 2013-01-18 NOTE — Progress Notes (Signed)
Quick Note:  AFP normal. Repeat in 6 mos.  Pt has OV coming up in Feb with KJ. ______

## 2013-02-01 ENCOUNTER — Telehealth: Payer: Self-pay | Admitting: *Deleted

## 2013-02-01 ENCOUNTER — Ambulatory Visit: Payer: Medicaid Other | Admitting: Urgent Care

## 2013-02-01 NOTE — Telephone Encounter (Signed)
Please attempt to reschedule

## 2013-02-01 NOTE — Telephone Encounter (Signed)
Pt was a no show

## 2013-02-08 NOTE — Telephone Encounter (Signed)
rescheduled

## 2013-03-03 ENCOUNTER — Ambulatory Visit (INDEPENDENT_AMBULATORY_CARE_PROVIDER_SITE_OTHER): Payer: Medicaid Other | Admitting: Urgent Care

## 2013-03-03 ENCOUNTER — Encounter: Payer: Self-pay | Admitting: Urgent Care

## 2013-03-03 VITALS — BP 131/74 | HR 79 | Temp 98.1°F | Ht 64.0 in | Wt 218.0 lb

## 2013-03-03 DIAGNOSIS — F1010 Alcohol abuse, uncomplicated: Secondary | ICD-10-CM | POA: Insufficient documentation

## 2013-03-03 DIAGNOSIS — R109 Unspecified abdominal pain: Secondary | ICD-10-CM

## 2013-03-03 DIAGNOSIS — K7460 Unspecified cirrhosis of liver: Secondary | ICD-10-CM

## 2013-03-03 NOTE — Progress Notes (Signed)
Referring Gabriella Leon: Gabriella Leon., PA-C Primary Care Physician:  Gabriella Leon., PA-C Primary Gastroenterologist:  Dr. Jena Leon  Chief Complaint  Patient presents with  . Follow-up  . Hepatitis C  . Cirrhosis  . Alcohol Problem    HPI:  Gabriella Leon is a 57 y.o. female here for follow up for Hep C cirrhosis.  She also has hx of ETOH abuse.  She had an EGD Dec 2012 & Grade III varices were noted.  She is due for surveillance June 2014.  Last colonoscopy Dec 2012 with minimal anal canal hemorrhoids, friable rectal and colonic mucosa, left-sided diverticulosis, repeat 2022.  Last reported she quit Oct 21, 2012, however today she admits to relapse with last ETOH yesterday.  She desires to quit, but feels she can't.  She has been having cramps in legs for 1 month.  C/o chronic RUQ cramping pain.  BMs 2 per day.  She is taking lactulose 30mg  BID.  C/o constant RUQ pain 7/10.  She has been gaining weight.  Belly feels tighter.  Denies jaundice, nausea, vomiting, melena, or rectal bleeding.  AFP 5.7.   Vitals - 1 value per visit 03/03/2013 11/04/2012  Weight (lb) 218 209.2   11/15/12 MRCP-. No specific biliary abnormality is observed. A segment of the common duct is obscured by metal artifact from the patient's cholecystectomy clips.  2. Cirrhosis with secondary evidence of portal venous hypertension including recanalized umbilical vein and distal paraesophageal varices.  3. Extensive motion artifact reduces diagnostic sensitivity and specificity. The patient refused postcontrast imaging, lowering sensitivity for hepatic cellular carcinoma detection.  Past Medical History  Diagnosis Date  . Diabetes mellitus   . Hypertension   . Cirrhosis   . Hepatitis B antibody positive   . Ulcer     ?  [name] Acid reflux disease with ulcer   . Depression   . Helicobacter pylori ab+     July 2010, s/p treatment  . GERD (gastroesophageal reflux disease)   . Gallstones   . Anxiety   . Blood transfusion   .  Hepatitis C     and B positive antibody    Past Surgical History  Procedure Laterality Date  . Cesarean section  1988    [facility]  . Cholecystectomy  greater than 10 yrs    [facility]  . Appendectomy      age 41  . Esophageal biopsy  12/18/2011    Esophageal varices. Distal esophageal erosions consistent with erosive reflux esophagitis Portal gastropathy / Hiatal hernia/ Antral and duodenal  erosions-status post gastric biopsy  . Colonoscopy  12/18/11    minimal anal canal hemorrhoids, friable rectal and colonic mucosa, left-sided diverticulosis, repeat in 2022.   [name] Esophagogastroduodenoscopy  12/18/11    3 columns of Grade III esophageal varices, reflux esohpagitis, portal gastropathy, path with chronic gastritis, negative H.pylori, surveillance in June 2014  . Multiple extractions with alveoloplasty  03/08/2012    Procedure: MULTIPLE EXTRACION WITH ALVEOLOPLASTY;  Surgeon: Georgia Lopes, DDS;  Location: [facility] OR;  Service: Oral Surgery;  Laterality: N/A;    Current Outpatient Prescriptions  Medication Sig Dispense Refill  . cyclobenzaprine (FLEXERIL) 10 MG tablet Take 10 mg by mouth every 8 (eight) hours as needed. For muscle spasms      . folic acid (FOLVITE) 1 MG tablet Take 1 mg by mouth daily.        [name] glimepiride (AMARYL) 2 MG tablet Take 2 mg by mouth daily.       [name]  ibuprofen (ADVIL,MOTRIN) 200 MG tablet Take 400 mg by mouth every 6 (six) hours as needed. For pain       . lactulose (CHRONULAC) 10 GM/15ML solution Take 30 mLs (20 g total) by mouth 2 (two) times daily. May increase to three times a day. Needs 2-3 soft bowel movements a day.  500 mL  3  . lisinopril-hydrochlorothiazide (PRINZIDE,ZESTORETIC) 10-12.5 MG per tablet Take 1 tablet by mouth daily.      . Multiple Vitamin (MULITIVITAMIN WITH MINERALS) TABS Take 1 tablet by mouth daily.        [name] omeprazole (PRILOSEC) 20 MG capsule Take 1 capsule (20 mg total) by mouth 2 (two) times daily. 30 min before meals  60 capsule  5   No  current facility-administered medications for this visit.    Allergies as of 03/03/2013  . (No Known Allergies)    Review of Systems: See HPI, otherwise negative ROS  Physical Exam: BP 131/74  Pulse 79  Temp(Src) 98.1 F (36.7 C) (Oral)  Ht 5\' 4"  (1.626 m)  Wt 218 lb (98.884 kg)  BMI 37.4 kg/m2 General:   Alert,  Well-developed, obese, pleasant and cooperative in NAD Eyes:  Sclera clear, no icterus.   Conjunctiva pink. Mouth:  No deformity or lesions, oropharynx pink and moist. Neck:  Supple; no masses or thyromegaly. Heart:  Regular rate and rhythm; no murmurs, clicks, rubs,  or gallops. Abdomen:  Normal bowel sounds.  No bruits.  Soft,mildly distended with positive fluid wave, non-tense.  No masses.  + hepatosplenomegaly.  No guarding or rebound tenderness.   Rectal:  Deferred. Msk:  Symmetrical without gross deformities.  Pulses:  Normal pulses noted. Extremities:  Trace ankle edema bilaterally.  No asterixis. Neurologic:  Alert and oriented x4;  grossly normal neurologically. Skin:  Intact without significant lesions or rashes.

## 2013-03-03 NOTE — Patient Instructions (Addendum)
Please get your labs as soon as possible.  We will call you with results. Please call alcoholic's anonymous or mental health counselor for help quitting alcohol Your next EGD will be with Dr. Jena Gauss in June 2014. We will call you to schedule an office visit prior to this. You will need abdominal ultrasound May 2014

## 2013-03-04 ENCOUNTER — Telehealth: Payer: Self-pay | Admitting: Urgent Care

## 2013-03-04 ENCOUNTER — Encounter: Payer: Self-pay | Admitting: Urgent Care

## 2013-03-04 LAB — CBC WITH DIFFERENTIAL/PLATELET
Basophils Relative: 1 % (ref 0–1)
Eosinophils Absolute: 0.2 10*3/uL (ref 0.0–0.7)
MCH: 34.4 pg — ABNORMAL HIGH (ref 26.0–34.0)
MCHC: 33.9 g/dL (ref 30.0–36.0)
Monocytes Relative: 8 % (ref 3–12)
Neutrophils Relative %: 45 % (ref 43–77)
Platelets: 55 10*3/uL — ABNORMAL LOW (ref 150–400)
RDW: 15.6 % — ABNORMAL HIGH (ref 11.5–15.5)

## 2013-03-04 LAB — PROTIME-INR: INR: 1.3 (ref <1.50)

## 2013-03-04 LAB — COMPREHENSIVE METABOLIC PANEL
Alkaline Phosphatase: 175 U/L — ABNORMAL HIGH (ref 39–117)
Glucose, Bld: 136 mg/dL — ABNORMAL HIGH (ref 70–99)
Sodium: 135 mEq/L (ref 135–145)
Total Bilirubin: 2.2 mg/dL — ABNORMAL HIGH (ref 0.3–1.2)
Total Protein: 7.9 g/dL (ref 6.0–8.3)

## 2013-03-04 NOTE — Assessment & Plan Note (Signed)
See cirrhosis °

## 2013-03-04 NOTE — Telephone Encounter (Signed)
Do we have records that pt ever had Hep A vaccine? If not, please call pt to see if or where this was done. Thanks

## 2013-03-04 NOTE — Telephone Encounter (Signed)
cc'd pcp 

## 2013-03-04 NOTE — Assessment & Plan Note (Signed)
Chronic RUQ pain.  MRCP & ABD [place] reassuring.  Chronic daily ETOH may be contributing to ETOH hepatitis, gastritis.

## 2013-03-04 NOTE — Telephone Encounter (Signed)
Called pt- she stated she has never had hep A  vaccines.

## 2013-03-04 NOTE — Progress Notes (Signed)
Faxed to PCP

## 2013-03-04 NOTE — Telephone Encounter (Signed)
OK, she needs from MUSE,ROCHELLE D., PA-C [facility]

## 2013-03-04 NOTE — Assessment & Plan Note (Addendum)
Chronic hepatitis C +/- ETOH cirrhosis.  Explained severity of pt's condition.  She did not seem to have good insight.  She desires to quit drinking ETOH.  Long discussion on options for help with this.  I am unsure re: Hep A vaccine status.  She has immunity to Hep B.  Mild abdominal ascites, trace lower extremity ascites, & weight gain.  No problems with encephalopathy.  MRI reviewed.    Check if pt received Hepatitis A vaccine  Urged ETOH cessation, #'s for AA & Mental Health given to pt.  Very little social support here in [place].  Son in [place].  EGD for esophageal varcies: due June 2014.  OV prior to this. Abdominal ultrasound to screen for [facility]:  due May 2014 Labs: CBC, INR, LFTS, serum ETOH

## 2013-03-07 NOTE — Progress Notes (Signed)
Quick Note:  Please let pt know labs back. WBC, Hgb, platelets low due to liver disease. Did she get ultrasound done yet? UV:[id number] D., PA-C   ______

## 2013-03-08 NOTE — Progress Notes (Signed)
Quick Note:  Pt aware. She is recall list to have U/S in May 2014.  Dawn, please cc pcp ______

## 2013-03-09 NOTE — Progress Notes (Signed)
Results faxed to PCP 

## 2013-04-20 ENCOUNTER — Telehealth: Payer: Self-pay

## 2013-04-20 NOTE — Telephone Encounter (Signed)
Pt would like to know if we could call her in something for her cough. I told her to call her PCP but I would ask. Please advise

## 2013-04-25 NOTE — Telephone Encounter (Signed)
Needs to speak with PCP

## 2013-05-09 ENCOUNTER — Telehealth: Payer: Self-pay | Admitting: Gastroenterology

## 2013-05-09 NOTE — Telephone Encounter (Signed)
Just checking to see if [place] of abdomen is ordered for patient for May. If not, needs one for routine hepatoma screening.

## 2013-05-10 ENCOUNTER — Telehealth: Payer: Self-pay | Admitting: Internal Medicine

## 2013-05-10 NOTE — Telephone Encounter (Signed)
Waiting for Ins to approve

## 2013-05-10 NOTE — Telephone Encounter (Signed)
May recall has that patient needs Abd U/S. Pt also has OV on 5/29 with [facility] to set up EGD with RMR

## 2013-05-12 ENCOUNTER — Other Ambulatory Visit: Payer: Self-pay | Admitting: Gastroenterology

## 2013-05-12 NOTE — Telephone Encounter (Signed)
Patient has had an abd u/s on 9/13 and her insurance has denied this one

## 2013-05-17 ENCOUNTER — Telehealth: Payer: Self-pay | Admitting: General Practice

## 2013-05-17 ENCOUNTER — Encounter: Payer: Self-pay | Admitting: Internal Medicine

## 2013-05-17 NOTE — Telephone Encounter (Signed)
ONE YEAR REPEAT MRI FROM MAY RECALL LIST

## 2013-05-17 NOTE — Telephone Encounter (Signed)
Letter was mailed to the patient.

## 2013-05-17 NOTE — Telephone Encounter (Signed)
SORRY!  REPEAT ABD U/S FORM MAY 2014

## 2013-05-17 NOTE — Telephone Encounter (Signed)
Patient had an MRI in 10/2012 and it says 1 yr but its showing up in May please advise

## 2013-05-19 ENCOUNTER — Ambulatory Visit: Payer: Medicaid Other | Admitting: Gastroenterology

## 2013-05-20 ENCOUNTER — Other Ambulatory Visit: Payer: Self-pay | Admitting: Internal Medicine

## 2013-05-20 DIAGNOSIS — K7030 Alcoholic cirrhosis of liver without ascites: Secondary | ICD-10-CM

## 2013-05-25 ENCOUNTER — Ambulatory Visit (HOSPITAL_COMMUNITY)
Admission: RE | Admit: 2013-05-25 | Discharge: 2013-05-25 | Disposition: A | Discharge status: Home / Self Care | Payer: Medicaid Other | Source: Ambulatory Visit | Attending: Internal Medicine | Admitting: Internal Medicine

## 2013-05-25 DIAGNOSIS — K7030 Alcoholic cirrhosis of liver without ascites: Secondary | ICD-10-CM

## 2013-05-25 DIAGNOSIS — K7460 Unspecified cirrhosis of liver: Secondary | ICD-10-CM | POA: Insufficient documentation

## 2013-05-31 ENCOUNTER — Ambulatory Visit (HOSPITAL_COMMUNITY): Payer: Medicaid Other

## 2013-05-31 ENCOUNTER — Encounter (HOSPITAL_COMMUNITY): Payer: Self-pay

## 2013-06-14 ENCOUNTER — Other Ambulatory Visit: Payer: Self-pay

## 2013-06-14 DIAGNOSIS — K7460 Unspecified cirrhosis of liver: Secondary | ICD-10-CM

## 2013-06-16 ENCOUNTER — Ambulatory Visit (INDEPENDENT_AMBULATORY_CARE_PROVIDER_SITE_OTHER): Payer: Medicaid Other | Admitting: Gastroenterology

## 2013-06-16 ENCOUNTER — Other Ambulatory Visit: Payer: Self-pay | Admitting: Gastroenterology

## 2013-06-16 ENCOUNTER — Ambulatory Visit (HOSPITAL_COMMUNITY)
Admission: RE | Admit: 2013-06-16 | Discharge: 2013-06-16 | Disposition: A | Discharge status: Home / Self Care | Payer: Medicaid Other | Source: Ambulatory Visit | Attending: Gastroenterology | Admitting: Gastroenterology

## 2013-06-16 ENCOUNTER — Encounter: Payer: Self-pay | Admitting: Gastroenterology

## 2013-06-16 VITALS — BP 132/75 | HR 93 | Temp 97.4°F | Wt 229.6 lb

## 2013-06-16 DIAGNOSIS — M545 Low back pain, unspecified: Secondary | ICD-10-CM

## 2013-06-16 DIAGNOSIS — K7460 Unspecified cirrhosis of liver: Secondary | ICD-10-CM

## 2013-06-16 DIAGNOSIS — M5137 Other intervertebral disc degeneration, lumbosacral region: Secondary | ICD-10-CM | POA: Insufficient documentation

## 2013-06-16 DIAGNOSIS — M51379 Other intervertebral disc degeneration, lumbosacral region without mention of lumbar back pain or lower extremity pain: Secondary | ICD-10-CM | POA: Insufficient documentation

## 2013-06-16 LAB — BASIC METABOLIC PANEL
Glucose, Bld: 76 mg/dL (ref 70–99)
Potassium: 4.1 mEq/L (ref 3.5–5.3)
Sodium: 132 mEq/L — ABNORMAL LOW (ref 135–145)

## 2013-06-16 MED ORDER — PROPRANOLOL HCL 20 MG PO TABS: 20.0000 mg | ORAL_TABLET | Freq: Two times a day (BID) | ORAL | Status: DC

## 2013-06-16 MED ORDER — OMEPRAZOLE 20 MG PO CPDR: 20.0000 mg | DELAYED_RELEASE_CAPSULE | Freq: Every day | ORAL | Status: DC

## 2013-06-16 NOTE — Patient Instructions (Addendum)
Please have blood work done today.  Start taking Prilosec each morning, 30 minutes before breakfast. This is for reflux.   Start taking Inderal (also called propanolol) 1 tablet twice a day. This decreases the pressure of the blood vessels in your esophagus.   We have ordered an xray of your back.   We will see you in 3 months

## 2013-06-16 NOTE — Progress Notes (Signed)
Quick Note:  Degenerative disc disease noted on lumbar xray. Please let patient know. Please route this to PCP for further management. ______

## 2013-06-16 NOTE — Assessment & Plan Note (Addendum)
Related to ETOH/HCV. +RNA in 2013, Genotype 1a. Unclear if patient ever made it to her first appointment for treatment discussion in [place]; pt does not have good insight regarding current health issues. However, she is well-compensated, with up-to-date lab work and negative hepatoma on [place] this month. With history of varices, needs prophylaxis with a non-selective beta blocker. As she has not had prior variceal bleed, may be able to manage with medication and avoid repeat EGD unless otherwise indicated. Still needs [facility] Encouraging she has abstained from ETOH since March 2014.   [facility] at [facility] Continue low-dose Lasix 20 mg Restart Prilosec 20 mg daily Repeat BMP today Check thiamin level; question deficiency secondary to long-term ETOH abuse Inderal 20 mg po BID 3 mos f/u

## 2013-06-16 NOTE — Progress Notes (Signed)
Referring Provider: Tylene Fantasia., PA-C Primary Care Physician:  Tylene Fantasia., PA-C Primary GI: Dr. Jena Gauss   Chief Complaint  Patient presents with  . Follow-up    HPI:   Ortha Metts returns today in follow-up of Hep C cirrhosis, hx of ETOH abuse. EGD Dec 2012 with Grade 3 varices. Also has a history of chronic RUQ pain with MRCP and abd [place] reassuring. Labs up-to-date as of March 2014. [place] of abdomen June 2014 with no hepatoma. Currently not on a non-selective beta-blocker for variceal prophylaxis.  Complains of lumbar back discomfort, pulling sensation when standing. Present since at least December upon review of notes. Was asked to see PCP, which she states she has done. Has to sit down after about 5 minutes. No radiation. Notes sensation of "needles" poking "all over". Denies confusion or mental status changes. Stopped drinking March 2014.   Still needs Hep A vaccination. She was referred for Hep C treatment due to +RNA; however, she has not completed this. Does not appear to have a good handle on current disease process, plan, etc.      Past Medical History  Diagnosis Date  . Diabetes mellitus   . Hypertension   . Cirrhosis   . Hepatitis B antibody positive   . Ulcer     ?  [name] Acid reflux disease with ulcer   . Depression   . Helicobacter pylori ab+     July 2010, s/p treatment  . GERD (gastroesophageal reflux disease)   . Gallstones   . Anxiety   . Blood transfusion   . Hepatitis C     and B positive antibody    Past Surgical History  Procedure Laterality Date  . Cesarean section  1988    [facility]  . Cholecystectomy  greater than 10 yrs    [facility]  . Appendectomy      age 32  . Esophageal biopsy  12/18/2011    Esophageal varices. Distal esophageal erosions consistent with erosive reflux esophagitis Portal gastropathy / Hiatal hernia/ Antral and duodenal  erosions-status post gastric biopsy  . Colonoscopy  12/18/11    minimal anal canal hemorrhoids, friable  rectal and colonic mucosa, left-sided diverticulosis, repeat in 2022.   [name] Esophagogastroduodenoscopy  12/18/11    3 columns of Grade III esophageal varices, reflux esohpagitis, portal gastropathy, path with chronic gastritis, negative H.pylori, surveillance in June 2014  . Multiple extractions with alveoloplasty  03/08/2012    Procedure: MULTIPLE EXTRACION WITH ALVEOLOPLASTY;  Surgeon: Georgia Lopes, DDS;  Location: [facility] OR;  Service: Oral Surgery;  Laterality: N/A;    Current Outpatient Prescriptions  Medication Sig Dispense Refill  . aspirin 81 MG tablet Take 81 mg by mouth daily.      . cyclobenzaprine (FLEXERIL) 10 MG tablet Take 10 mg by mouth every 8 (eight) hours as needed. For muscle spasms      . furosemide (LASIX) 20 MG tablet Take 20 mg by mouth daily.      [name] glimepiride (AMARYL) 2 MG tablet Take 2 mg by mouth daily.       [name] lisinopril-hydrochlorothiazide (PRINZIDE,ZESTORETIC) 10-12.5 MG per tablet Take 1 tablet by mouth daily.      . metoprolol tartrate (LOPRESSOR) 25 MG tablet Take 12.5 mg by mouth daily.      . ranitidine (ZANTAC) 300 MG tablet Take 300 mg by mouth at bedtime.      [name] omeprazole (PRILOSEC) 20 MG capsule Take 1 capsule (20 mg total) by mouth daily.  30 capsule  11   No current facility-administered medications for this visit.    Allergies as of 06/16/2013  . (No Known Allergies)    Family History  Problem Relation Age of Onset  . Diabetes Mother   . Cancer Paternal Uncle     passed away age 50  . Anesthesia problems Neg Hx   . Hypotension Neg Hx   . Malignant hyperthermia Neg Hx   . Pseudochol deficiency Neg Hx     History   Social History  . Marital Status: Legally Separated    Spouse Name: N/A    Number of Children: 1  . Years of Education: N/A   Occupational History  . disabled    Social History Main Topics  . Smoking status: Current Every Day Smoker -- 0.50 packs/day for 20 years    Types: Cigarettes  . Smokeless tobacco: Never Used  .  Alcohol Use: 3.0 oz/week    5 Shots of liquor per week     Comment: 1/2 pint gin daily, patient states "has  cut back alot"  . Drug Use: No  . Sexually Active: Yes    Birth Control/ Protection: Post-menopausal   Other Topics Concern  . None   Social History Narrative  . None    Review of Systems: Negative unless mentioned in HPI.   Physical Exam: BP 132/75  Pulse 93  Temp(Src) 97.4 F (36.3 C) (Oral)  Wt 229 lb 9.6 oz (104.146 kg)  BMI 39.39 kg/m2 General:   Alert and oriented. No distress noted. Pleasant and cooperative.  Head:  Normocephalic and atraumatic. Eyes:  Conjuctiva clear without scleral icterus. Heart:  S1, S2 present without murmurs, rubs, or gallops. Regular rate and rhythm. Abdomen:  +BS, soft, non-tender and non-distended. No rebound or guarding. Mild hepatomegaly. Msk:  Symmetrical without gross deformities. Normal posture. Extremities:  Without edema. Neurologic:  Alert and  oriented x4;  grossly normal neurologically. Skin:  Intact without significant lesions or rashes. Psych:  Alert and cooperative. Normal mood and affect.

## 2013-06-16 NOTE — Progress Notes (Signed)
Cc PCP 

## 2013-06-16 NOTE — Assessment & Plan Note (Signed)
Without radiation, denies bowel or bladder incontinence or issues. Persistent, without much improvement with supportive measures. Check xray; refer back to PCP.

## 2013-06-17 NOTE — Progress Notes (Signed)
Quick Note:  Pt aware. cc'd pcp ______

## 2013-06-28 ENCOUNTER — Encounter (HOSPITAL_COMMUNITY): Payer: Medicaid Other | Attending: Oncology

## 2013-06-28 ENCOUNTER — Ambulatory Visit (HOSPITAL_COMMUNITY): Payer: Medicaid Other

## 2013-06-28 ENCOUNTER — Encounter (HOSPITAL_COMMUNITY): Payer: Self-pay

## 2013-06-28 VITALS — BP 99/58 | HR 62 | Temp 98.2°F | Resp 16 | Ht 66.0 in | Wt 221.3 lb

## 2013-06-28 DIAGNOSIS — R109 Unspecified abdominal pain: Secondary | ICD-10-CM

## 2013-06-28 DIAGNOSIS — D61818 Other pancytopenia: Secondary | ICD-10-CM

## 2013-06-28 DIAGNOSIS — D649 Anemia, unspecified: Secondary | ICD-10-CM

## 2013-06-28 LAB — CBC WITH DIFFERENTIAL/PLATELET
Basophils Relative: 1 % (ref 0–1)
Eosinophils Relative: 6 % — ABNORMAL HIGH (ref 0–5)
HCT: 36.2 % (ref 36.0–46.0)
Hemoglobin: 12.5 g/dL (ref 12.0–15.0)
Lymphs Abs: 1 10*3/uL (ref 0.7–4.0)
MCV: 102.8 fL — ABNORMAL HIGH (ref 78.0–100.0)
Monocytes Relative: 11 % (ref 3–12)
Neutro Abs: 2.2 10*3/uL (ref 1.7–7.7)
RBC: 3.52 MIL/uL — ABNORMAL LOW (ref 3.87–5.11)
WBC: 3.8 10*3/uL — ABNORMAL LOW (ref 4.0–10.5)

## 2013-06-28 NOTE — Addendum Note (Signed)
Addended by: Corena Herter D on: 06/28/2013 04:18 PM   Modules accepted: Orders

## 2013-06-28 NOTE — Progress Notes (Signed)
History of present illness:  Patient last seen in clinic on 11/11/2011. She is referred back for further evaluation of her pancytopenia. Patient denies any repeated fevers or illnesses. She does admit to easy bleeding and bruising. She denies any weakness or fatigue. She has no neurologic complaints. She denies any chest pain or shortness of breath. Currently, she is having mild abdominal discomfort. She also is complaining of chronic lower back pain. She denies any nausea, vomiting, constipation, or diarrhea. She has no urinary complaints. Patient otherwise feels well and offers no further specific complaints.  Review of systems: As per history of present illness, otherwise a 10 system review is negative.  General:  Well-developed, well-nourished, no acute distress. Mental status: Normal affect. Eyes: Anicteric sclera. Respiratory: Clear to auscultation bilaterally. Cardiovascular: Regular rate and rhythm, no rubs, murmurs, or gallops. Gastrointestinal: Soft, nontender, nondistended, normoactive bowel sounds. Musculoskeletal: No edema. Skin: No rashes or petechiae noted. Neurological: Alert, answering all questions appropriately. Cranial nerves grossly intact.    Assessment and plan:  1. Pancytopenia: On further review of patient's chart her laboratory work is decreased, but essentially unchanged since at least 11/11/2011. Since that time her white blood cell count has ranged from 2.4-2.8, her platelet count has ranged from 45-69, and her hemoglobin has ranged from 10.7-11.3.  Given the chronicity of the problem, her pancytopenia is most likely secondary to her underlying cirrhosis and hepatitis C. She possibly has some chronic bone marrow suppression given her previous history of extensive alcohol use.  This would require a bone marrow biopsy to diagnosed, but this is unnecessary at this time. For completeness have ordered an ultrasound of her abdomen to assess for splenomegaly. Previously in  September 2013 her spleen size was 10.7 cm. Also, have ordered B12, folate, and iron stores.  Return to clinic in 3 months for repeat laboratory work and further evaluation. Her laboratory work is pending at time of dictation, but if something concerning returns in her laboratory work  will call the patient to return to clinic sooner. She expressed understanding and was in agreement with this plan. 2. Abdominal discomfort: Possibly secondary to patient's known history of gastric ulcers. Monitor.

## 2013-06-28 NOTE — Patient Instructions (Signed)
[facility] Discharge Instructions  RECOMMENDATIONS MADE BY THE CONSULTANT AND ANY TEST RESULTS WILL BE SENT TO YOUR REFERRING PHYSICIAN.  EXAM FINDINGS BY THE PHYSICIAN TODAY AND SIGNS OR SYMPTOMS TO REPORT TO CLINIC OR PRIMARY PHYSICIAN: Exam as discussed by Dr. Hessie Dibble.  SPECIAL INSTRUCTIONS/FOLLOW-UP: 1.  You had labs done today, please contact our office by Friday to follow-up on your results if you haven't heard back before then. 2..You are scheduled to be seen in the clinic again in 3 months, at which time you'll have labs and an MD visit.  Thank you for choosing [facility] to provide your oncology and hematology care.  To afford each patient quality time with our providers, please arrive at least 15 minutes before your scheduled appointment time.  With your help, our goal is to use those 15 minutes to complete the necessary work-up to ensure our physicians have the information they need to help with your evaluation and healthcare recommendations.    Effective January 1st, 2014, we ask that you re-schedule your appointment with our physicians should you arrive 10 or more minutes late for your appointment.  We strive to give you quality time with our providers, and arriving late affects you and other patients whose appointments are after yours.    Again, thank you for choosing [facility].  Our hope is that these requests will decrease the amount of time that you wait before being seen by our physicians.       _____________________________________________________________  Should you have questions after your visit to [facility], please contact our office at [phone] between the hours of 8:30 a.m. and 5:00 p.m.  Voicemails left after 4:30 p.m. will not be returned until the following business day.  For prescription refill requests, have your pharmacy contact our office with your prescription refill request.

## 2013-06-29 ENCOUNTER — Other Ambulatory Visit: Payer: Self-pay | Admitting: Gastroenterology

## 2013-06-29 LAB — IRON AND TIBC
Saturation Ratios: 89 % — ABNORMAL HIGH (ref 20–55)
UIBC: 33 ug/dL — ABNORMAL LOW (ref 125–400)

## 2013-06-29 LAB — VITAMIN B12: Vitamin B-12: 945 pg/mL — ABNORMAL HIGH (ref 211–911)

## 2013-06-29 MED ORDER — VITAMIN B-1 100 MG PO TABS: 100.0000 mg | ORAL_TABLET | Freq: Every day | ORAL | Status: DC

## 2013-06-29 NOTE — Progress Notes (Signed)
Quick Note:  Thiamin deficiency noted.  Needs to start thiamin 100 mg po daily.  I will send to pharmacy. ______

## 2013-06-30 ENCOUNTER — Ambulatory Visit (HOSPITAL_COMMUNITY): Payer: Medicaid Other | Attending: Oncology

## 2013-07-12 ENCOUNTER — Other Ambulatory Visit: Payer: Self-pay

## 2013-07-12 ENCOUNTER — Ambulatory Visit (HOSPITAL_COMMUNITY): Payer: Medicaid Other

## 2013-07-13 MED ORDER — FUROSEMIDE 20 MG PO TABS: 20.0000 mg | ORAL_TABLET | Freq: Every day | ORAL | Status: DC

## 2013-07-19 ENCOUNTER — Ambulatory Visit (HOSPITAL_COMMUNITY)
Admission: RE | Admit: 2013-07-19 | Discharge: 2013-07-19 | Disposition: A | Discharge status: Home / Self Care | Payer: Medicaid Other | Source: Ambulatory Visit | Attending: Oncology | Admitting: Oncology

## 2013-07-19 DIAGNOSIS — K7460 Unspecified cirrhosis of liver: Secondary | ICD-10-CM | POA: Insufficient documentation

## 2013-07-20 ENCOUNTER — Encounter (HOSPITAL_COMMUNITY): Payer: Self-pay

## 2013-09-14 ENCOUNTER — Encounter: Payer: Self-pay | Admitting: Gastroenterology

## 2013-09-14 ENCOUNTER — Ambulatory Visit (INDEPENDENT_AMBULATORY_CARE_PROVIDER_SITE_OTHER): Payer: Medicaid Other | Admitting: Gastroenterology

## 2013-09-14 VITALS — BP 129/77 | HR 69 | Temp 97.6°F | Ht 68.0 in | Wt 230.2 lb

## 2013-09-14 DIAGNOSIS — B182 Chronic viral hepatitis C: Secondary | ICD-10-CM

## 2013-09-14 DIAGNOSIS — K7460 Unspecified cirrhosis of liver: Secondary | ICD-10-CM

## 2013-09-14 LAB — CBC WITH DIFFERENTIAL/PLATELET
Basophils Absolute: 0 10*3/uL (ref 0.0–0.1)
Basophils Relative: 1 % (ref 0–1)
Eosinophils Absolute: 0.2 10*3/uL (ref 0.0–0.7)
HCT: 30.5 % — ABNORMAL LOW (ref 36.0–46.0)
MCH: 37.5 pg — ABNORMAL HIGH (ref 26.0–34.0)
MCHC: 35.1 g/dL (ref 30.0–36.0)
Monocytes Absolute: 0.3 10*3/uL (ref 0.1–1.0)
Neutro Abs: 1.1 10*3/uL — ABNORMAL LOW (ref 1.7–7.7)
Neutrophils Relative %: 43 % (ref 43–77)
RDW: 16.1 % — ABNORMAL HIGH (ref 11.5–15.5)

## 2013-09-14 LAB — PROTIME-INR
INR: 1.41 (ref <1.50)
Prothrombin Time: 17 seconds — ABNORMAL HIGH (ref 11.6–15.2)

## 2013-09-14 LAB — BASIC METABOLIC PANEL
BUN: 13 mg/dL (ref 6–23)
Calcium: 8.5 mg/dL (ref 8.4–10.5)
Chloride: 103 mEq/L (ref 96–112)
Creat: 1.08 mg/dL (ref 0.50–1.10)

## 2013-09-14 LAB — HEPATIC FUNCTION PANEL
ALT: 27 U/L (ref 0–35)
AST: 64 U/L — ABNORMAL HIGH (ref 0–37)
Albumin: 2.7 g/dL — ABNORMAL LOW (ref 3.5–5.2)
Bilirubin, Direct: 1.3 mg/dL — ABNORMAL HIGH (ref 0.0–0.3)

## 2013-09-14 MED ORDER — SPIRONOLACTONE 50 MG PO TABS: 50.0000 mg | ORAL_TABLET | Freq: Every day | ORAL | Status: DC

## 2013-09-14 NOTE — Progress Notes (Signed)
Referring Provider: Toma Deiters, MD Primary Care Physician:  Tylene Fantasia., PA-C Primary GI: Dr. Jena Gauss   Chief Complaint  Patient presents with  . Follow-up    HPI:   Gabriella Leon returns today in follow-up of Hep C cirrhosis, hx of ETOH abuse. EGD Dec 2012 with Grade 2 varices, on Inderal for prophylaxis. Hx of chronic RUQ pain with MRCP and abd Korea reassuring. Due for Korea of abdomen in Jan 2015. Still has not obtained Hep A vaccination.   Drank some beer this weekend. Denies drinking daily. Only drinking on the weekends. Hep C clinic would cost 75$. Hasn't been able to go. States right-sided/RUQ discomfort. Intermittent. Not worse. At baseline. No melena. No rectal bleeding. Weight up, good appetite. Feels like feet are "swollen all the time". GERD controlled with Prilosec.   Past Medical History  Diagnosis Date  . Diabetes mellitus   . Hypertension   . Cirrhosis   . Hepatitis B antibody positive   . Ulcer     ?  [name] Acid reflux disease with ulcer   . Depression   . Helicobacter pylori ab+     July 2010, s/p treatment  . GERD (gastroesophageal reflux disease)   . Gallstones   . Anxiety   . Blood transfusion   . Hepatitis C     and B positive antibody    Past Surgical History  Procedure Laterality Date  . Cesarean section  1988    [facility]  . Cholecystectomy  greater than 10 yrs    [facility]  . Appendectomy      age 56  . Esophageal biopsy  12/18/2011    Esophageal varices. Distal esophageal erosions consistent with erosive reflux esophagitis Portal gastropathy / Hiatal hernia/ Antral and duodenal  erosions-status post gastric biopsy  . Colonoscopy  12/18/11    minimal anal canal hemorrhoids, friable rectal and colonic mucosa, left-sided diverticulosis, repeat in 2022.   [name] Esophagogastroduodenoscopy  12/18/11    3 columns of Grade II esophageal varices, reflux esohpagitis, portal gastropathy, path with chronic gastritis, negative H.pylori, surveillance in June 2014   . Multiple extractions with alveoloplasty  03/08/2012    Procedure: MULTIPLE EXTRACION WITH ALVEOLOPLASTY;  Surgeon: Georgia Lopes, DDS;  Location: [facility] OR;  Service: Oral Surgery;  Laterality: N/A;    Current Outpatient Prescriptions  Medication Sig Dispense Refill  . aspirin 81 MG tablet Take 81 mg by mouth daily.      . cyclobenzaprine (FLEXERIL) 10 MG tablet Take 10 mg by mouth every 8 (eight) hours as needed. For muscle spasms      . furosemide (LASIX) 20 MG tablet Take 1 tablet (20 mg total) by mouth daily.  30 tablet  3  . glimepiride (AMARYL) 2 MG tablet Take 2 mg by mouth daily.       [name] lisinopril-hydrochlorothiazide (PRINZIDE,ZESTORETIC) 10-12.5 MG per tablet Take 1 tablet by mouth daily.      . metoprolol tartrate (LOPRESSOR) 25 MG tablet Take 12.5 mg by mouth daily.      [name] omeprazole (PRILOSEC) 20 MG capsule Take 1 capsule (20 mg total) by mouth daily.  30 capsule  11  . propranolol (INDERAL) 20 MG tablet Take 1 tablet (20 mg total) by mouth 2 (two) times daily.  60 tablet  3  . ranitidine (ZANTAC) 300 MG tablet Take 300 mg by mouth at bedtime.      . thiamine (VITAMIN B-1) 100 MG tablet Take 1 tablet (100 mg total) by mouth  daily.  30 tablet  5  . propranolol (INDERAL LA) 60 MG 24 hr capsule Take 1 capsule (60 mg total) by mouth daily.  30 capsule  11  . spironolactone (ALDACTONE) 50 MG tablet Take 1 tablet (50 mg total) by mouth daily.  30 tablet  11   No current facility-administered medications for this visit.    Allergies as of 09/14/2013  . (No Known Allergies)    Family History  Problem Relation Age of Onset  . Diabetes Mother   . Cancer Paternal Uncle     passed away age 25  . Anesthesia problems Neg Hx   . Hypotension Neg Hx   . Malignant hyperthermia Neg Hx   . Pseudochol deficiency Neg Hx     History   Social History  . Marital Status: Legally Separated    Spouse Name: N/A    Number of Children: 1  . Years of Education: N/A   Occupational History   . disabled    Social History Main Topics  . Smoking status: Current Every Day Smoker -- 0.50 packs/day for 20 years    Types: Cigarettes  . Smokeless tobacco: Never Used  . Alcohol Use: 3.0 oz/week    5 Shots of liquor per week     Comment: 1/2 pint gin daily, patient states "has  cut back alot" 06/28/13 none in 4 months.  . Drug Use: No  . Sexual Activity: Yes    Birth Control/ Protection: Post-menopausal   Other Topics Concern  . None   Social History Narrative  . None    Review of Systems: Negative unless mentioned in HPI.   Physical Exam: BP 129/77  Pulse 69  Temp(Src) 97.6 F (36.4 C) (Oral)  Ht 5\' 8"  (1.727 m)  Wt 230 lb 3.2 oz (104.418 kg)  BMI 35.01 kg/m2 General:   Alert and oriented. No distress noted. Pleasant and cooperative.  Head:  Normocephalic and atraumatic. Eyes:  Conjuctiva clear without scleral icterus. Mouth:  Oral mucosa pink and moist. Good dentition. No lesions. Heart:  S1, S2 present without murmurs, rubs, or gallops. Regular rate and rhythm. Abdomen:  +BS, soft, non-tender and non-distended. No rebound or guarding. No HSM or masses noted. Msk:  Symmetrical without gross deformities. Normal posture. Extremities:  Without edema. Neurologic:  Alert and  oriented x4;  grossly normal neurologically. Skin:  Intact without significant lesions or rashes. Psych:  Alert and cooperative. Normal mood and affect.

## 2013-09-14 NOTE — Patient Instructions (Addendum)
Please have blood work completed.   I will be discussing with Dr. Jena Gauss the need for repeat upper endoscopy and options for Hepatitis treatment.   Continue to take Lasix 20 mg daily. I have added Aldactone 50 mg daily as well. DO NOT START TAKING ALDACTONE UNTIL WE CALL YOU AND LET YOU KNOW YOUR LAB WORK IS OK. You will take these both in the morning. Limit your salt intake to only 2 grams per day. Limit your overall fluid intake to 2 liters per day.   We will see you back in 6 months or sooner if needed.

## 2013-09-15 ENCOUNTER — Encounter: Payer: Self-pay | Admitting: Gastroenterology

## 2013-09-15 NOTE — Progress Notes (Signed)
Quick Note:  Awaiting final lab results, but patient may go ahead and take Aldactone 50 mg daily along with Lasix 20 mg daily. ______

## 2013-09-15 NOTE — Assessment & Plan Note (Signed)
Hep C/ETOH cirrhosis, unable to receive treatment in [place] due to cost. Will need to research options closer. Still has not received Hep A vaccination. Overall well-compensated but does note mild lower extremity edema intermittently. Last EGD in Dec 2012 with Grade 2 varices, on Inderal for prophylaxis. Recommend repeat EGD for surveillance, and if no change in varices may be able to hold off on repeat EGDs as long as she is on Inderal.   Proceed with upper endoscopy in the near future with Dr. Jena Gauss. The risks, benefits, and alternatives have been discussed in detail with patient. They have stated understanding and desire to proceed.  Phenergan 25 mg IV on call due to ETOH abuse ETOH CESSATION Add low dose Aldactone (50 mg) to current dosing of Lasix 20 mg daily Obtain Hep A vaccination Routine labs today, Korea of abdomen in Jan 2015 Return in 6 months

## 2013-09-15 NOTE — Assessment & Plan Note (Signed)
Unable to receive treatment in [place] due to cost. Will research other options.

## 2013-09-15 NOTE — Progress Notes (Signed)
CC'd to PCP 

## 2013-09-18 DIAGNOSIS — D731 Hypersplenism: Secondary | ICD-10-CM | POA: Insufficient documentation

## 2013-09-18 LAB — VITAMIN B1: Vitamin B1 (Thiamine): 107 nmol/L — ABNORMAL HIGH (ref 8–30)

## 2013-09-19 ENCOUNTER — Telehealth: Payer: Self-pay | Admitting: Internal Medicine

## 2013-09-19 NOTE — Telephone Encounter (Signed)
Spoke with pt and she is aware of results.

## 2013-09-19 NOTE — Progress Notes (Signed)
Quick Note:  Discriminant Function 27. Needs to pursue ETOH cessation. Stop supplemental Vit B1  ______

## 2013-09-19 NOTE — Telephone Encounter (Signed)
Pt was seen on 9/24 and called today to see if lab results were back. Please call her at [phone]

## 2013-09-28 ENCOUNTER — Other Ambulatory Visit (HOSPITAL_COMMUNITY): Payer: Medicaid Other

## 2013-09-29 ENCOUNTER — Encounter (HOSPITAL_COMMUNITY): Payer: Self-pay

## 2013-09-29 ENCOUNTER — Ambulatory Visit (HOSPITAL_COMMUNITY): Payer: Medicaid Other

## 2013-10-27 ENCOUNTER — Encounter (HOSPITAL_COMMUNITY): Payer: Self-pay

## 2013-10-27 ENCOUNTER — Ambulatory Visit (HOSPITAL_COMMUNITY): Payer: Medicaid Other

## 2013-10-27 DIAGNOSIS — D61818 Other pancytopenia: Secondary | ICD-10-CM

## 2013-10-27 HISTORY — DX: Other pancytopenia: D61.818

## 2013-10-27 NOTE — Progress Notes (Signed)
No Show

## 2013-10-28 ENCOUNTER — Encounter (HOSPITAL_COMMUNITY): Payer: Self-pay

## 2013-11-13 NOTE — Progress Notes (Signed)
-  No show, letter mailed-

## 2013-11-14 ENCOUNTER — Ambulatory Visit (HOSPITAL_COMMUNITY): Payer: Medicaid Other | Admitting: Oncology

## 2013-11-16 ENCOUNTER — Telehealth: Payer: Self-pay | Admitting: Internal Medicine

## 2013-11-16 NOTE — Telephone Encounter (Signed)
Not Due till Jan 2015

## 2013-11-16 NOTE — Telephone Encounter (Signed)
Pt to have Korea of Abd in 6 months

## 2013-11-21 NOTE — Progress Notes (Signed)
MUSE,Gabriella D., PA-C [address] [place] [address]  Other pancytopenia - Plan: CBC with Differential, Comprehensive metabolic panel, CBC with Differential, Comprehensive metabolic panel  Cirrhosis - Plan: Comprehensive metabolic panel, AFP tumor marker, Comprehensive metabolic panel, AFP tumor marker  Hypersplenism syndrome - Plan: Comprehensive metabolic panel, Comprehensive metabolic panel  Hepatitis C, chronic - Plan: Comprehensive metabolic panel, Comprehensive metabolic panel  ETOH abuse  Peripheral neuropathy - Plan: Ambulatory referral to Neurology  Hemoptysis - Plan: CT Chest Wo Contrast  CURRENT THERAPY: Observation  INTERVAL HISTORY: Gabriella Leon 57 y.o. female returns for  regular  visit for followup of pancytopenia secondary to cirrhosis of liver, splenomegaly, chronic hepatitis C, and history of EtOH abuse.   I personally reviewed and went over laboratory results with the patient.  Labs are 2 months old and therefore will be repeated today.  I personally reviewed and went over radiographic studies with the patient.  HCC surveillance for those at high risk of developing HCC, such as those patients with HBV, HCV, cirrhosis of liver recommends [place] of liver at 6 month intervals.  The incidence of HCC is 3 to 8 percent per year among patients with cirrhosis from HBV, HCV, or primary biliary cirrhosis The 2010 American Association for the Study of Liver Diseases (AASLD) guideline on the management of hepatocellular carcinoma (HCC) recommends that surveillance be performed using ultrasonography at six-month intervals.  The sensitivity of ultrasound for detecting HCC has been estimated at 94 percent, though it drops to 63 percent for detecting early HCC.  The six-month interval is based primarily on observational data, the expected growth rates of HCC, and preliminary data suggesting that survival is better when surveillance is performed every six months rather than every 12  months.  The surveillance interval is a function of the tumor growth rate, not the degree of risk of developing HCC. Thus, the surveillance interval does not need to be shortened for patients at higher-risk for [facility].   Other tests that have been proposed for surveillance, such as the ratio of the L3 fraction of AFP to total AFP, des-gamma-carboxy prothrombin, and many other serum proteins or RNA molecules. However, none of these has been adequately studied as a surveillance test, and they cannot be recommended.   The combined use of AFP and ultrasonography increases detection rates, but it also increases costs and false-positive rates and is not recommended by the AASLD. Because of its poor sensitivity and specificity, alpha-fetoprotein testing alone should not be used unless ultrasound is unavailable.  Computed tomographic scanning is not recommended for surveillance because of a high false-positive rate and the risks associated with cumulative radiation exposure from repeated scans.   -Up-to-Date: "Prevention of hepatocellular carcinoma and recommendations for surveillance in adults with chronic liver disease"  Gabriella Leon is a poor historian and reports that she recently had lab work "somewhere" but cannot tell me where.  Last labs in [facility] are from September 2014 and since she does not know where she had them completed and therefore I cannot ascertain these results. Thus, we will perform labs today.   She continues to smoke.  She reports that she smokes 1/2 ppd and she has been doing so x 15 years she reports.  She denies any times in which she smoked more than this.    She reports to a 6 month history of hemoptysis.  She reports that she coughs blood with her phlegm.  She reports that she also blows blood out of her  nasal passages.  Now this may be from her decreased platelet count + the dry air + smoking, but it is certainly important to evaluate her for bronchogenic carcinoma.  I am sure the insurance  company will give me a problem with an CT scan prior to chest xray, but no matter if the chest xray were positive or negative, the patient would still require a CT of chest.  Therefore, I am going to try to bypass a chest xray and move forward with a CT of chest.  Additionally, she complains of increased SOB.  She also reports right LE peripheral neuropathy.  An xray of back performed in 2014 showed degenerative disc disease.  Since this is one sided, we will refer the patient to neurology at the patient's request for further evaluation.    Hematologically, she otherwise denies any complaints and ROS questioning is negative.   Past Medical History  Diagnosis Date  . Diabetes mellitus   . Hypertension   . Cirrhosis   . Hepatitis B antibody positive   . Ulcer     ?  [name] Acid reflux disease with ulcer   . Depression   . Helicobacter pylori ab+     July 2010, s/p treatment  . GERD (gastroesophageal reflux disease)   . Gallstones   . Anxiety   . Blood transfusion   . Hepatitis C     and B positive antibody  . Other pancytopenia 10/27/2013    has Cirrhosis; Abdominal pain; Screening for colon cancer; Epigastric pain; RUQ pain; ETOH abuse; Lower back pain; Hepatitis C, chronic; Hypersplenism syndrome; and Other pancytopenia on her problem list.     has No Known Allergies.  Gabriella Leon had no medications administered during this visit.  Past Surgical History  Procedure Laterality Date  . Cesarean section  1988    [facility]  . Cholecystectomy  greater than 10 yrs    [facility]  . Appendectomy      age 53  . Esophageal biopsy  12/18/2011    Esophageal varices. Distal esophageal erosions consistent with erosive reflux esophagitis Portal gastropathy / Hiatal hernia/ Antral and duodenal  erosions-status post gastric biopsy  . Colonoscopy  12/18/11    minimal anal canal hemorrhoids, friable rectal and colonic mucosa, left-sided diverticulosis, repeat in 2022.   [name] Esophagogastroduodenoscopy   12/18/11    3 columns of Grade II esophageal varices, reflux esohpagitis, portal gastropathy, path with chronic gastritis, negative H.pylori, surveillance in June 2014  . Multiple extractions with alveoloplasty  03/08/2012    Procedure: MULTIPLE EXTRACION WITH ALVEOLOPLASTY;  Surgeon: Georgia Lopes, DDS;  Location: [facility] OR;  Service: Oral Surgery;  Laterality: N/A;    Denies any headaches, dizziness, double vision, fevers, chills, night sweats, nausea, vomiting, diarrhea, constipation, chest pain, heart palpitations, blood in stool, black tarry stool, urinary pain, urinary burning, urinary frequency, hematuria.   PHYSICAL EXAMINATION  ECOG PERFORMANCE STATUS: 1 - Symptomatic but completely ambulatory  Filed Vitals:   11/22/13 1316  BP: 127/83  Pulse: 80  Temp: 98.2 F (36.8 C)  Resp: 20    GENERAL:alert, no distress, well nourished, well developed, comfortable, cooperative, obese, smiling and chronically ill appearing, smelling of tobacco and EtOH. SKIN: skin color, texture, turgor are normal, no rashes or significant lesions HEAD: Normocephalic, No masses, lesions, tenderness or abnormalities EYES: normal, PERRLA, EOMI, Conjunctiva are pink and non-injected EARS: External ears normal OROPHARYNX:mucous membranes are moist  NECK: supple, no adenopathy, trachea midline LYMPH:  no palpable lymphadenopathy BREAST:not  examined LUNGS: clear to auscultation , decreased breath sounds HEART: regular rate & rhythm, no murmurs, no gallops, S1 normal and S2 normal ABDOMEN:abdomen soft, non-tender, obese, normal bowel sounds, no masses or organomegaly and no hepatosplenomegaly BACK: Back symmetric, no curvature., No CVA tenderness EXTREMITIES:less then 2 second capillary refill, no joint deformities, effusion, or inflammation, no cyanosis  NEURO: alert & oriented x 3 with fluent speech, no focal motor/sensory deficits, gait normal    LABORATORY DATA: CBC    Component Value Date/Time   WBC  2.6* 09/14/2013 1158   RBC 2.85* 09/14/2013 1158   RBC 3.21* 11/11/2011 1619   HGB 10.7* 09/14/2013 1158   HCT 30.5* 09/14/2013 1158   PLT 53* 09/14/2013 1158   MCV 107.0* 09/14/2013 1158   MCH 37.5* 09/14/2013 1158   MCHC 35.1 09/14/2013 1158   RDW 16.1* 09/14/2013 1158   LYMPHSABS 1.0 09/14/2013 1158   MONOABS 0.3 09/14/2013 1158   EOSABS 0.2 09/14/2013 1158   BASOSABS 0.0 09/14/2013 1158      Chemistry      Component Value Date/Time   NA 136 09/14/2013 1158   K 3.9 09/14/2013 1158   CL 103 09/14/2013 1158   CO2 25 09/14/2013 1158   BUN 13 09/14/2013 1158   CREATININE 1.08 09/14/2013 1158   CREATININE 0.97 03/04/2012 1338      Component Value Date/Time   CALCIUM 8.5 09/14/2013 1158   ALKPHOS 136* 09/14/2013 1158   AST 64* 09/14/2013 1158   ALT 27 09/14/2013 1158   BILITOT 3.1* 09/14/2013 1158     Iron/TIBC/Ferritin    Component Value Date/Time   IRON 277* 06/28/2013 1620   TIBC 310 06/28/2013 1620   FERRITIN 459* 06/28/2013 1620   Results for LIEN, LYMAN (MRN [id number]) as of 11/13/2013 10:37  Ref. Range 09/14/2013 11:58  Vitamin B1 (Thiamine) Latest Range: 8-30 nmol/L 107 (H)    Results for LEENAH, SEIDNER (MRN [id number]) as of 11/13/2013 10:37  Ref. Range 09/14/2013 11:58  Prothrombin Time Latest Range: 11.6-15.2 seconds 17.0 (H)  INR Latest Range: <1.50  1.41   Results for AVARI, NEVARES (MRN [id number]) as of 11/13/2013 10:37  Ref. Range 06/28/2013 16:20  Vitamin B-12 Latest Range: 211-911 pg/mL 945 (H)     RADIOGRAPHIC STUDIES:  07/19/2013  *RADIOLOGY REPORT*  Clinical Data: Cirrhosis. Possible splenomegaly.  COMPLETE ABDOMINAL ULTRASOUND  Comparison: 09/17/2012  Findings:  Gallbladder: Prior cholecystectomy.  Common bile duct: Normal caliber, 5-6 mm.  Liver: Liver echotexture is heterogeneous with nodular contours of  the liver compatible with known cirrhosis. No focal abnormality.  IVC: Within normal limits.  Pancreas: Pancreatic tail was obscured by overlying bowel gas.   Remainder of the pancreas is unremarkable. No pancreatic ductal  dilatation or focal abnormality.  Spleen: Within normal limits in size and echotexture. 2.2 cm  adjacent nodule, likely accessory spleen.  Right Kidney: Normal in size and parenchymal echogenicity. No  evidence of mass or hydronephrosis.  Left Kidney: Normal in size and parenchymal echogenicity. No  evidence of mass or hydronephrosis.  Abdominal aorta: No aneurysm identified.  IMPRESSION:  Changes compatible with known cirrhosis. No focal abnormality. No  evidence of splenomegaly.  Original Report Authenticated By: Charlett Nose, M.D.     ASSESSMENT:  1. Pancytopenia, secondary to #2, and #3. 2. Cirrhosis of liver 3. Splenomegaly 4. Chronic Hep C 5. History of EtOH abuse, may be contributing to #1 due to bone marrow suppression. 6. Tobacco abuse, smoking 1/2 ppd x 15 years 7. Right LE  peripheral neuropathy 8. Hemoptysis x 6 months 9. Increased SOB  Patient Active Problem List   Diagnosis Date Noted  . Other pancytopenia 10/27/2013  . Hypersplenism syndrome 09/18/2013  . Hepatitis C, chronic 09/14/2013  . Lower back pain 06/16/2013  . ETOH abuse 03/03/2013  . RUQ pain 11/07/2012  . Epigastric pain 02/04/2012  . Cirrhosis 11/26/2011  . Abdominal pain 11/26/2011  . Screening for colon cancer 11/26/2011      PLAN:  1. I personally reviewed and went over laboratory results with the patient. 2. I personally reviewed and went over radiographic studies with the patient. 3. Smoking cessation education provided.  4. Labs today: CBC diff, CMET, AFP 5. Referral to neurology due to right LE peripheral neuropathy 6. CT of chest without contrast due to hemoptysis, increased SOB, and tobacco abuse 7. Return in 4 months for follow-up   THERAPY PLAN:  We will repeat labs today (since she does not know where she had labs performed 2 weeks ago; see above for more details).  I will perform a CT of chest due to  hemoptysis, 6 months with increased SOB and fatigue.    All questions were answered. The patient knows to call the clinic with any problems, questions or concerns. We can certainly see the patient much sooner if necessary.  Patient and plan discussed with Dr. Alla German and he is in agreement with the aforementioned.   Naasia Weilbacher

## 2013-11-22 ENCOUNTER — Encounter: Payer: Self-pay | Admitting: Internal Medicine

## 2013-11-22 ENCOUNTER — Encounter (HOSPITAL_COMMUNITY): Payer: Medicaid Other | Attending: Oncology | Admitting: Oncology

## 2013-11-22 VITALS — BP 127/83 | HR 80 | Temp 98.2°F | Resp 20 | Wt 231.2 lb

## 2013-11-22 DIAGNOSIS — R042 Hemoptysis: Secondary | ICD-10-CM

## 2013-11-22 DIAGNOSIS — G629 Polyneuropathy, unspecified: Secondary | ICD-10-CM

## 2013-11-22 DIAGNOSIS — D731 Hypersplenism: Secondary | ICD-10-CM

## 2013-11-22 DIAGNOSIS — F1010 Alcohol abuse, uncomplicated: Secondary | ICD-10-CM

## 2013-11-22 DIAGNOSIS — B182 Chronic viral hepatitis C: Secondary | ICD-10-CM

## 2013-11-22 DIAGNOSIS — K7460 Unspecified cirrhosis of liver: Secondary | ICD-10-CM | POA: Insufficient documentation

## 2013-11-22 DIAGNOSIS — F17200 Nicotine dependence, unspecified, uncomplicated: Secondary | ICD-10-CM

## 2013-11-22 DIAGNOSIS — D61818 Other pancytopenia: Secondary | ICD-10-CM

## 2013-11-22 DIAGNOSIS — G609 Hereditary and idiopathic neuropathy, unspecified: Secondary | ICD-10-CM

## 2013-11-22 LAB — COMPREHENSIVE METABOLIC PANEL
BUN: 15 mg/dL (ref 6–23)
Calcium: 8.6 mg/dL (ref 8.4–10.5)
Creatinine, Ser: 1.33 mg/dL — ABNORMAL HIGH (ref 0.50–1.10)
GFR calc Af Amer: 51 mL/min — ABNORMAL LOW (ref >90)
Glucose, Bld: 111 mg/dL — ABNORMAL HIGH (ref 70–99)
Total Protein: 7.8 g/dL (ref 6.0–8.3)

## 2013-11-22 LAB — CBC WITH DIFFERENTIAL/PLATELET
Basophils Relative: 1 % (ref 0–1)
Eosinophils Absolute: 0.1 10*3/uL (ref 0.0–0.7)
Eosinophils Relative: 4 % (ref 0–5)
HCT: 28.4 % — ABNORMAL LOW (ref 36.0–46.0)
Hemoglobin: 9.8 g/dL — ABNORMAL LOW (ref 12.0–15.0)
MCH: 37.3 pg — ABNORMAL HIGH (ref 26.0–34.0)
MCHC: 34.5 g/dL (ref 30.0–36.0)
Monocytes Absolute: 0.4 10*3/uL (ref 0.1–1.0)
Monocytes Relative: 10 % (ref 3–12)
Neutro Abs: 1.3 10*3/uL — ABNORMAL LOW (ref 1.7–7.7)

## 2013-11-22 NOTE — Patient Instructions (Addendum)
[facility] Discharge Instructions  RECOMMENDATIONS MADE BY THE CONSULTANT AND ANY TEST RESULTS WILL BE SENT TO YOUR REFERRING PHYSICIAN.  EXAM FINDINGS BY THE PHYSICIAN TODAY AND SIGNS OR SYMPTOMS TO REPORT TO CLINIC OR PRIMARY PHYSICIAN:    Ct scan of chest this month. We will call with appointment.   Labs today.   Return in 4 months to see MD.   Refer to Neurologist for back.    Thank you for choosing [facility] to provide your oncology and hematology care.  To afford each patient quality time with our providers, please arrive at least 15 minutes before your scheduled appointment time.  With your help, our goal is to use those 15 minutes to complete the necessary work-up to ensure our physicians have the information they need to help with your evaluation and healthcare recommendations.    Effective January 1st, 2014, we ask that you re-schedule your appointment with our physicians should you arrive 10 or more minutes late for your appointment.  We strive to give you quality time with our providers, and arriving late affects you and other patients whose appointments are after yours.    Again, thank you for choosing [facility].  Our hope is that these requests will decrease the amount of time that you wait before being seen by our physicians.       _____________________________________________________________  Should you have questions after your visit to [facility], please contact our office at [phone] between the hours of 8:30 a.m. and 5:00 p.m.  Voicemails left after 4:30 p.m. will not be returned until the following business day.  For prescription refill requests, have your pharmacy contact our office with your prescription refill request.

## 2013-11-22 NOTE — Telephone Encounter (Signed)
Letter has been mailed to Mrs. Laural Benes

## 2013-11-23 ENCOUNTER — Encounter (HOSPITAL_COMMUNITY): Payer: Self-pay

## 2013-11-23 LAB — AFP TUMOR MARKER: AFP-Tumor Marker: 5.6 ng/mL (ref 0.0–8.0)

## 2013-12-02 ENCOUNTER — Telehealth (HOSPITAL_COMMUNITY): Payer: Self-pay | Admitting: Oncology

## 2013-12-02 NOTE — Telephone Encounter (Signed)
Made countless attempts to contact pt to notify her of the provider eligibility issue we are have with Dr Zigmund Daniel and [facility]. Pt did not answer nor does she Have voicemail. I will continue to call until the end of my shift.  Called radiology spoke with Darel Hong and cancelled appt for Monday @ 9

## 2013-12-05 ENCOUNTER — Ambulatory Visit (HOSPITAL_COMMUNITY): Payer: Medicaid Other

## 2013-12-09 ENCOUNTER — Other Ambulatory Visit (HOSPITAL_COMMUNITY): Payer: Medicaid Other

## 2013-12-23 NOTE — Progress Notes (Signed)
REVIEWED.  

## 2013-12-26 ENCOUNTER — Ambulatory Visit (HOSPITAL_COMMUNITY)
Admission: RE | Admit: 2013-12-26 | Discharge: 2013-12-26 | Disposition: A | Discharge status: Home / Self Care | Payer: Medicaid Other | Source: Ambulatory Visit | Attending: Oncology | Admitting: Oncology

## 2013-12-26 DIAGNOSIS — R918 Other nonspecific abnormal finding of lung field: Secondary | ICD-10-CM | POA: Insufficient documentation

## 2013-12-26 DIAGNOSIS — R042 Hemoptysis: Secondary | ICD-10-CM

## 2013-12-26 DIAGNOSIS — R5383 Other fatigue: Secondary | ICD-10-CM

## 2013-12-26 DIAGNOSIS — K7460 Unspecified cirrhosis of liver: Secondary | ICD-10-CM | POA: Insufficient documentation

## 2013-12-26 DIAGNOSIS — F17200 Nicotine dependence, unspecified, uncomplicated: Secondary | ICD-10-CM | POA: Insufficient documentation

## 2013-12-26 DIAGNOSIS — R5381 Other malaise: Secondary | ICD-10-CM | POA: Insufficient documentation

## 2014-03-22 ENCOUNTER — Encounter (INDEPENDENT_AMBULATORY_CARE_PROVIDER_SITE_OTHER): Payer: Self-pay

## 2014-03-22 ENCOUNTER — Encounter: Payer: Self-pay | Admitting: Gastroenterology

## 2014-03-22 ENCOUNTER — Ambulatory Visit (INDEPENDENT_AMBULATORY_CARE_PROVIDER_SITE_OTHER): Payer: Medicaid Other | Admitting: Gastroenterology

## 2014-03-22 VITALS — BP 129/83 | HR 76 | Temp 98.4°F | Ht 64.0 in | Wt 239.4 lb

## 2014-03-22 DIAGNOSIS — K5900 Constipation, unspecified: Secondary | ICD-10-CM | POA: Insufficient documentation

## 2014-03-22 DIAGNOSIS — K7460 Unspecified cirrhosis of liver: Secondary | ICD-10-CM

## 2014-03-22 DIAGNOSIS — B182 Chronic viral hepatitis C: Secondary | ICD-10-CM

## 2014-03-22 MED ORDER — LUBIPROSTONE 24 MCG PO CAPS: 24.0000 ug | ORAL_CAPSULE | Freq: Two times a day (BID) | ORAL | Status: DC

## 2014-03-22 NOTE — Progress Notes (Signed)
Primary Care Physician: Neale Burly, MD  Primary Gastroenterologist:  Garfield Cornea, MD   Chief Complaint  Patient presents with  . Follow-up    HPI: Gabriella Leon is a 58 y.o. female here for f/u of chronic HCV, cirrhosis, h/o etoh abuse. Last EGD 2012 with Grade 2 varices. Has not been vaccinated to Hepatitis A due to patient noncompliance. Overdue for repeat EGD.  Has been in the hospital twice this year. Had diuretics adjusted. No longer on aldactone. No etoh in three months. Heartburn most days. No dysphagia. BM irregular. BM about 2-3 times per week. Straining. No melena, brbpr. No abdominal pain. Lots of itching.   Current Outpatient Prescriptions  Medication Sig Dispense Refill  . acamprosate (CAMPRAL) 333 MG tablet Take 666 mg by mouth 3 (three) times daily with meals.      [name] aspirin 81 MG tablet Take 81 mg by mouth daily.      . furosemide (LASIX) 40 MG tablet Take 40 mg by mouth daily.       [name] glimepiride (AMARYL) 2 MG tablet Take 2 mg by mouth daily.       [name] omeprazole (PRILOSEC) 20 MG capsule Take 1 capsule (20 mg total) by mouth daily.  30 capsule  11  . propranolol (INDERAL) 20 MG tablet Take 1 tablet (20 mg total) by mouth 2 (two) times daily.  60 tablet  3   No current facility-administered medications for this visit.    Allergies as of 03/22/2014  . (No Known Allergies)   Past Medical History  Diagnosis Date  . Diabetes mellitus   . Hypertension   . Cirrhosis   . Hepatitis B antibody positive   . Ulcer     ?  [name] Acid reflux disease with ulcer   . Depression   . Helicobacter pylori ab+     July 2010, s/p treatment  . GERD (gastroesophageal reflux disease)   . Gallstones   . Anxiety   . Blood transfusion   . Hepatitis C     and B positive antibody  . Other pancytopenia 10/27/2013   Past Surgical History  Procedure Laterality Date  . Cesarean section  1988    [facility]  . Cholecystectomy  greater than 10 yrs    [facility]  . Appendectomy     age 56  . Esophageal biopsy  12/18/2011       . Colonoscopy  12/18/11    minimal anal canal hemorrhoids, friable rectal and colonic mucosa, left-sided diverticulosis, repeat in 2022.   [name] Esophagogastroduodenoscopy  12/18/11    3 columns of Grade II esophageal varices, reflux esohpagitis, portal gastropathy, path with chronic gastritis, negative H.pylori, surveillance in June 2014  . Multiple extractions with alveoloplasty  03/08/2012    Procedure: MULTIPLE EXTRACION WITH ALVEOLOPLASTY;  Surgeon: Gae Bon, DDS;  Location: [place];  Service: Oral Surgery;  Laterality: N/A;   History  Substance Use Topics  . Smoking status: Current Every Day Smoker -- 0.50 packs/day for 20 years    Types: Cigarettes  . Smokeless tobacco: Never Used  . Alcohol Use: 3.0 oz/week    5 Shots of liquor per week     Comment: 1/2 pint gin daily, patient states "has  cut back alot" 06/28/13 none in 4 months.   Family History  Problem Relation Age of Onset  . Diabetes Mother   . Cancer Paternal Uncle     passed away age 74  . Anesthesia problems Neg  Hx   . Hypotension Neg Hx   . Malignant hyperthermia Neg Hx   . Pseudochol deficiency Neg Hx      ROS:  General: Negative for anorexia, weight loss, fever, chills, fatigue, weakness. ENT: Negative for hoarseness, difficulty swallowing , nasal congestion. CV: Negative for chest pain, angina, palpitations, dyspnea on exertion. + peripheral edema.  Respiratory: Negative for dyspnea at rest, dyspnea on exertion, cough, sputum, wheezing.  GI: See history of present illness. GU:  Negative for dysuria, hematuria, urinary incontinence, urinary frequency, nocturnal urination.  Endo: Negative for unusual weight change.  MS: c/o joint pain.   Physical Examination:   BP 129/83  Pulse 76  Temp(Src) 98.4 F (36.9 C) (Oral)  Ht 5\' 4"  (1.626 m)  Wt 239 lb 6.4 oz (108.591 kg)  BMI 41.07 kg/m2  General: Well-nourished, well-developed in no acute distress.  Eyes: No  icterus. Mouth: Oropharyngeal mucosa moist and pink , no lesions erythema or exudate. Lungs: Clear to auscultation bilaterally.  Heart: Regular rate and rhythm, no murmurs rubs or gallops.  Abdomen: Bowel sounds are normal, nontender, nondistended, no hepatosplenomegaly or masses, no abdominal bruits or hernia , no rebound or guarding.   Extremities: 1+ bilateral lower extremity edema. No clubbing or deformities. Neuro: Alert and oriented x 4   Skin: Warm and dry, no jaundice.   Psych: Alert and cooperative, normal mood and affect.  Labs:  Lab Results  Component Value Date   WBC 3.5* 11/22/2013   HGB 9.8* 11/22/2013   HCT 28.4* 11/22/2013   MCV 108.0* 11/22/2013   PLT 50* 11/22/2013   Lab Results  Component Value Date   CREATININE 1.33* 11/22/2013   BUN 15 11/22/2013   NA 134* 11/22/2013   K 4.0 11/22/2013   CL 102 11/22/2013   CO2 21 11/22/2013   Lab Results  Component Value Date   ALT 29 11/22/2013   AST 76* 11/22/2013   ALKPHOS 232* 11/22/2013   BILITOT 1.5* 11/22/2013    Imaging Studies: No results found.

## 2014-03-22 NOTE — Assessment & Plan Note (Addendum)
Cirrhosis in setting of chronic HCV (genotype 1a) treatment naive, and etoh abuse. Sober for three months. Has not received Hep A vaccines due to noncompliance. Has refused going to hepatitis clinic due to copay. She is overdue EGD surveillance. We will schedule in upcoming weeks. She will need phenergan 25mg  IV 30 minutes before to augment conscious sedation.   Retrieve recent labs from [facility] and D/C summary. Consider adjusting diuretics based on findings. Looks like also scheduled for upcoming labs with hematology. Will follow up as available.   Due for abd u/s for hepatoma surveillance.   OV in 3 months.

## 2014-03-22 NOTE — Patient Instructions (Signed)
1. I will get your records from [facility] for review. 2. Abdominal ultrasound to look at your liver.  3. Start Amitiza for constipation. Take one daily with breakfast and evening meal. RX sent to [facility]. 4. Keep up the good work with avoiding ALL alcohol. 5. Office visit in 3 months.

## 2014-03-22 NOTE — Progress Notes (Signed)
[place]ly[name] [address]  Other pancytopenia - Plan: CBC with Differential, Iron and TIBC, Ferritin, Miscellaneous test, CBC with Differential, Iron and TIBC, Ferritin, Miscellaneous test  Cirrhosis - Plan: CBC with Differential, Comprehensive metabolic panel, Iron and TIBC, Ferritin, Miscellaneous test, CBC with Differential, Comprehensive metabolic panel, Iron and TIBC, Ferritin, Miscellaneous test  Hepatitis C, chronic - Plan: Comprehensive metabolic panel, Comprehensive metabolic panel  Hypersplenism syndrome - Plan: CBC with Differential, CBC with Differential  ETOH abuse - Plan: Comprehensive metabolic panel, Comprehensive metabolic panel  Macrocytic anemia - Plan: CBC with Differential, Vitamin B12, Folate, Folate RBC, Methylmalonic acid, serum, Homocysteine, serum, Intrinsic Factor Antibodies, Miscellaneous test, CBC with Differential, Vitamin B12, Folate, Folate RBC, Methylmalonic acid, serum, Homocysteine, serum, Intrinsic Factor Antibodies, Miscellaneous test  CURRENT THERAPY:Observation  INTERVAL HISTORY: Gabriella Leon 58 y.o. female returns for  regular  visit for followup of pancytopenia secondary to cirrhosis of liver, splenomegaly, chronic hepatitis C, and history of EtOH abuse.   I personally reviewed and went over laboratory results with the patient.  The results are noted within this dictation.  I personally reviewed and went over radiographic studies with the patient.  The results are noted within this dictation.  Ct scan of chest performed 3 months ago secondary to the complaint of hemoptysis demonstrated:  1. No evidence of thoracic malignancy.  2. Nonspecific patchy ground-glass opacities in both lungs  superimposed on emphysema. These findings may reflect early  fibrosis, inflammation or aspirated blood given the history of  hemoptysis. No endobronchial lesions observed.  3. Mild atherosclerosis.  4. Cirrhosis with upper  abdominal and possible distal esophageal  varices.  She reports that she was admitted at [facility] for an illness that caused her to stop drinking at home resulting in renal failure.  That was 2-3 months ago and she is now recovered from that.    She notes a left knee pain.  I performed a physical exam on this, but defer evaluation and treatment to her PCP.  I recommended she follow-up with him.   She reports that she quite drinking EtOH about 3 months ago.   She otherwise denies any complaints and hematologic ROS questioning is negative.   Past Medical History  Diagnosis Date  . Diabetes mellitus   . Hypertension   . Cirrhosis   . Hepatitis B antibody positive   . Ulcer     ?  [name] Acid reflux disease with ulcer   . Depression   . Helicobacter pylori ab+     July 2010, s/p treatment  . GERD (gastroesophageal reflux disease)   . Gallstones   . Anxiety   . Blood transfusion   . Hepatitis C     and B positive antibody  . Other pancytopenia 10/27/2013    has Cirrhosis; Abdominal pain; Screening for colon cancer; Epigastric pain; RUQ pain; ETOH abuse; Lower back pain; Hepatitis C, chronic; Hypersplenism syndrome; Other pancytopenia; and Unspecified constipation on her problem list.     has No Known Allergies.  [name] does not currently have medications on file.  Past Surgical History  Procedure Laterality Date  . Cesarean section  1988    [facility]  . Cholecystectomy  greater than 10 yrs    [facility]  . Appendectomy      age 37  . Esophageal biopsy  12/18/2011       . Colonoscopy  12/18/11    minimal anal canal hemorrhoids, friable rectal  and colonic mucosa, left-sided diverticulosis, repeat in 2022.   [name] Esophagogastroduodenoscopy  12/18/11    3 columns of Grade II esophageal varices, reflux esohpagitis, portal gastropathy, path with chronic gastritis, negative H.pylori, surveillance in June 2014  . Multiple extractions with alveoloplasty  03/08/2012    Procedure:  MULTIPLE EXTRACION WITH ALVEOLOPLASTY;  Surgeon: Gae Bon, DDS;  Location: [place];  Service: Oral Surgery;  Laterality: N/A;    Denies any headaches, dizziness, double vision, fevers, chills, night sweats, nausea, vomiting, diarrhea, constipation, chest pain, heart palpitations, shortness of breath, blood in stool, black tarry stool, urinary pain, urinary burning, urinary frequency, hematuria.   PHYSICAL EXAMINATION  ECOG PERFORMANCE STATUS: 1 - Symptomatic but completely ambulatory  Filed Vitals:   03/23/14 1200  BP: 124/73  Pulse: 68  Temp: 98.3 F (36.8 C)  Resp: 18    GENERAL:alert, no distress, well nourished, well developed, comfortable, cooperative and smiling SKIN: skin color, texture, turgor are normal, no rashes or significant lesions HEAD: Normocephalic, No masses, lesions, tenderness or abnormalities EYES: normal, PERRLA, EOMI, Conjunctiva are pink and non-injected EARS: External ears normal OROPHARYNX:mucous membranes are moist  NECK: supple, trachea midline LYMPH:  no palpable lymphadenopathy BREAST:not examined LUNGS: clear to auscultation  HEART: regular rate & rhythm, no murmurs and no gallops ABDOMEN:non-tender, obese and normal bowel sounds BACK: Back symmetric, no curvature. EXTREMITIES:less then 2 second capillary refill, no joint deformities, effusion, or inflammation, no skin discoloration, positive findings:  edema B/L edema, L>R 1-2 + pitting without erythema or heat in calf or popliteal area, and no pain with a negative Homan's sign.   Positive left knee effusion with heat anteriorly and tenderness medially. NEURO: alert & oriented x 3 with fluent speech, no focal motor/sensory deficits, gait normal   LABORATORY DATA: CBC    Component Value Date/Time   WBC 3.5* 11/22/2013 1403   RBC 2.63* 11/22/2013 1403   RBC 3.21* 11/11/2011 1619   HGB 9.8* 11/22/2013 1403   HCT 28.4* 11/22/2013 1403   PLT 50* 11/22/2013 1403   MCV 108.0* 11/22/2013 1403   MCH  37.3* 11/22/2013 1403   MCHC 34.5 11/22/2013 1403   RDW 16.6* 11/22/2013 1403   LYMPHSABS 1.7 11/22/2013 1403   MONOABS 0.4 11/22/2013 1403   EOSABS 0.1 11/22/2013 1403   BASOSABS 0.0 11/22/2013 1403      Chemistry      Component Value Date/Time   NA 134* 11/22/2013 1403   K 4.0 11/22/2013 1403   CL 102 11/22/2013 1403   CO2 21 11/22/2013 1403   BUN 15 11/22/2013 1403   CREATININE 1.33* 11/22/2013 1403   CREATININE 1.08 09/14/2013 1158      Component Value Date/Time   CALCIUM 8.6 11/22/2013 1403   ALKPHOS 232* 11/22/2013 1403   AST 76* 11/22/2013 1403   ALT 29 11/22/2013 1403   BILITOT 1.5* 11/22/2013 1403     No results found for this basename: AFP   Lab Results  Component Value Date   IRON 277* 06/28/2013   TIBC 310 06/28/2013   FERRITIN 459* 06/28/2013   Results for Gabriella Leon, Gabriella Leon (MRN [id number]) as of 03/22/2014 17:57  Ref. Range 11/22/2013 14:03  AFP-Tumor Marker Latest Range: 0.0-8.0 ng/mL 5.6     RADIOGRAPHIC STUDIES:  12/27/2013  CLINICAL DATA: Smoker with hemoptysis and fatigue. History of  cirrhosis.  EXAM:  CT CHEST WITHOUT CONTRAST  TECHNIQUE:  Multidetector CT imaging of the chest was performed following the  standard protocol without IV contrast.  COMPARISON:  Chest radiographs 03/04/2012. Abdominal MRI 11/09/2012  and CT 11/17/2011.  FINDINGS:  There is mild calcific atherosclerosis of the aorta, great vessels  and coronary arteries. No enlarged mediastinal, hilar or axillary  lymph nodes are identified. There is no pleural or pericardial  effusion. As correlated with the prior studies, there are probable  small distal esophageal varices associated with a small hiatal  hernia.  Mild centrilobular emphysema is noted. Scattered ground-glass  opacities are noted, primarily within the lower lobes, medially  within the lingula and peripherally within the right middle lobe. No  confluent airspace opacity, pulmonary nodule or endobronchial lesion  is demonstrated.  Images  through the upper abdomen again demonstrate changes of  hepatic cirrhosis with diffuse contour irregularity of the liver and  relative enlargement of the left and caudate lobes. Recanalized  periumbilical vein and perigastric varices are noted.  IMPRESSION:  1. No evidence of thoracic malignancy.  2. Nonspecific patchy ground-glass opacities in both lungs  superimposed on emphysema. These findings may reflect early  fibrosis, inflammation or aspirated blood given the history of  hemoptysis. No endobronchial lesions observed.  3. Mild atherosclerosis.  4. Cirrhosis with upper abdominal and possible distal esophageal  varices.  Electronically Signed  By: Camie Patience M.D.  On: 12/26/2013 10:24    ASSESSMENT:  1. Pancytopenia, secondary to #2, and #3.  2. Cirrhosis of liver  3. Splenomegaly  4. Chronic Hep C  5. History of EtOH abuse, quit in Jan 2015.    6. Tobacco abuse, smoking 1/2 ppd x 15 years 7. Macrocytic anemia 8. Possible esophageal varices 9. Left knee pain, on medial aspect  Patient Active Problem List   Diagnosis Date Noted  . Unspecified constipation 03/22/2014  . Other pancytopenia 10/27/2013  . Hypersplenism syndrome 09/18/2013  . Hepatitis C, chronic 09/14/2013  . Lower back pain 06/16/2013  . ETOH abuse 03/03/2013  . RUQ pain 11/07/2012  . Epigastric pain 02/04/2012  . Cirrhosis 11/26/2011  . Abdominal pain 11/26/2011  . Screening for colon cancer 11/26/2011     PLAN:  1. I personally reviewed and went over laboratory results with the patient.  The results are noted within this dictation. 2. I personally reviewed and went over radiographic studies with the patient.  The results are noted within this dictation.   3. Labs today: CBC diff, CMET, anemia panel (due to esophageal varices and macrocytosis), methylmalonic acid, homocysteine level, and intrinsic factor antibodies. 4. Recommend follow-up with PCP 5. Return in 6 months for follow-up   THERAPY  PLAN:  Her labs are stable.  She needs to continue follow-up with GI as directed.    All questions were answered. The patient knows to call the clinic with any problems, questions or concerns. We can certainly see the patient much sooner if necessary.  Patient and plan discussed with Dr. Farrel Gobble and he is in agreement with the aforementioned.   Gabriella Leon 03/23/2014

## 2014-03-22 NOTE — Assessment & Plan Note (Signed)
Add Amitiza 25mcg BID with food.

## 2014-03-23 ENCOUNTER — Encounter (HOSPITAL_COMMUNITY): Payer: Self-pay | Admitting: Oncology

## 2014-03-23 ENCOUNTER — Encounter (HOSPITAL_COMMUNITY): Payer: Medicaid Other | Attending: Oncology | Admitting: Oncology

## 2014-03-23 VITALS — BP 124/73 | HR 68 | Temp 98.3°F | Resp 18 | Wt 238.6 lb

## 2014-03-23 DIAGNOSIS — F1021 Alcohol dependence, in remission: Secondary | ICD-10-CM

## 2014-03-23 DIAGNOSIS — F17200 Nicotine dependence, unspecified, uncomplicated: Secondary | ICD-10-CM | POA: Insufficient documentation

## 2014-03-23 DIAGNOSIS — E669 Obesity, unspecified: Secondary | ICD-10-CM | POA: Insufficient documentation

## 2014-03-23 DIAGNOSIS — D539 Nutritional anemia, unspecified: Secondary | ICD-10-CM | POA: Insufficient documentation

## 2014-03-23 DIAGNOSIS — K219 Gastro-esophageal reflux disease without esophagitis: Secondary | ICD-10-CM | POA: Insufficient documentation

## 2014-03-23 DIAGNOSIS — D731 Hypersplenism: Secondary | ICD-10-CM

## 2014-03-23 DIAGNOSIS — D61818 Other pancytopenia: Secondary | ICD-10-CM | POA: Insufficient documentation

## 2014-03-23 DIAGNOSIS — K7460 Unspecified cirrhosis of liver: Secondary | ICD-10-CM

## 2014-03-23 DIAGNOSIS — Z79899 Other long term (current) drug therapy: Secondary | ICD-10-CM | POA: Insufficient documentation

## 2014-03-23 DIAGNOSIS — B182 Chronic viral hepatitis C: Secondary | ICD-10-CM | POA: Insufficient documentation

## 2014-03-23 DIAGNOSIS — F1010 Alcohol abuse, uncomplicated: Secondary | ICD-10-CM

## 2014-03-23 LAB — CBC WITH DIFFERENTIAL/PLATELET
BASOS PCT: 1 % (ref 0–1)
Basophils Absolute: 0 10*3/uL (ref 0.0–0.1)
Eosinophils Absolute: 0.2 10*3/uL (ref 0.0–0.7)
Eosinophils Relative: 4 % (ref 0–5)
HCT: 30.7 % — ABNORMAL LOW (ref 36.0–46.0)
HEMOGLOBIN: 10.4 g/dL — AB (ref 12.0–15.0)
LYMPHS PCT: 28 % (ref 12–46)
Lymphs Abs: 1 10*3/uL (ref 0.7–4.0)
MCH: 35.5 pg — ABNORMAL HIGH (ref 26.0–34.0)
MCHC: 33.9 g/dL (ref 30.0–36.0)
MCV: 104.8 fL — ABNORMAL HIGH (ref 78.0–100.0)
MONO ABS: 0.4 10*3/uL (ref 0.1–1.0)
MONOS PCT: 11 % (ref 3–12)
Neutro Abs: 2.1 10*3/uL (ref 1.7–7.7)
Neutrophils Relative %: 57 % (ref 43–77)
Platelets: 52 10*3/uL — ABNORMAL LOW (ref 150–400)
RBC: 2.93 MIL/uL — ABNORMAL LOW (ref 3.87–5.11)
RDW: 16.1 % — ABNORMAL HIGH (ref 11.5–15.5)
WBC: 3.7 10*3/uL — ABNORMAL LOW (ref 4.0–10.5)

## 2014-03-23 LAB — COMPREHENSIVE METABOLIC PANEL
ALT: 26 U/L (ref 0–35)
AST: 50 U/L — ABNORMAL HIGH (ref 0–37)
Albumin: 2.4 g/dL — ABNORMAL LOW (ref 3.5–5.2)
Alkaline Phosphatase: 205 U/L — ABNORMAL HIGH (ref 39–117)
BILIRUBIN TOTAL: 1.9 mg/dL — AB (ref 0.3–1.2)
BUN: 7 mg/dL (ref 6–23)
CHLORIDE: 100 meq/L (ref 96–112)
CO2: 29 meq/L (ref 19–32)
CREATININE: 1.12 mg/dL — AB (ref 0.50–1.10)
Calcium: 8.6 mg/dL (ref 8.4–10.5)
GFR calc Af Amer: 63 mL/min — ABNORMAL LOW (ref >90)
GFR, EST NON AFRICAN AMERICAN: 54 mL/min — AB (ref >90)
GLUCOSE: 103 mg/dL — AB (ref 70–99)
Potassium: 4.1 mEq/L (ref 3.7–5.3)
Sodium: 137 mEq/L (ref 137–147)
Total Protein: 7.2 g/dL (ref 6.0–8.3)

## 2014-03-23 LAB — IRON AND TIBC
Iron: 111 ug/dL (ref 42–135)
Saturation Ratios: 41 % (ref 20–55)
TIBC: 268 ug/dL (ref 250–470)
UIBC: 157 ug/dL (ref 125–400)

## 2014-03-23 LAB — HOMOCYSTEINE: Homocysteine: 12.1 umol/L (ref 4.0–15.4)

## 2014-03-23 NOTE — Progress Notes (Signed)
cc'd to pcp 

## 2014-03-23 NOTE — Progress Notes (Signed)
Gabriella Leon presented for [employer]. Labs per MD order drawn via Peripheral Line 21 gauge needle inserted in right AC  Good blood return present. Procedure without incident.  Needle removed intact. Patient tolerated procedure well.

## 2014-03-23 NOTE — Patient Instructions (Signed)
[place] Discharge Instructions  RECOMMENDATIONS MADE BY THE CONSULTANT AND ANY TEST RESULTS WILL BE SENT TO YOUR REFERRING PHYSICIAN.  EXAM FINDINGS BY THE PHYSICIAN TODAY AND SIGNS OR SYMPTOMS TO REPORT TO CLINIC OR PRIMARY PHYSICIAN: Exam and findings as discussed by Robynn Pane, PA-C.  We need to check some labs today and if anything is abnormal we will let you know.  MEDICATIONS PRESCRIBED:  none  INSTRUCTIONS/FOLLOW-UP: Follow-up in 6 months.  Thank you for choosing [facility] to provide your oncology and hematology care.  To afford each patient quality time with our providers, please arrive at least 15 minutes before your scheduled appointment time.  With your help, our goal is to use those 15 minutes to complete the necessary work-up to ensure our physicians have the information they need to help with your evaluation and healthcare recommendations.    Effective January 1st, 2014, we ask that you re-schedule your appointment with our physicians should you arrive 10 or more minutes late for your appointment.  We strive to give you quality time with our providers, and arriving late affects you and other patients whose appointments are after yours.    Again, thank you for choosing [facility].  Our hope is that these requests will decrease the amount of time that you wait before being seen by our physicians.       _____________________________________________________________  Should you have questions after your visit to [facility], please contact our office at [phone] between the hours of 8:30 a.m. and 5:00 p.m.  Voicemails left after 4:30 p.m. will not be returned until the following business day.  For prescription refill requests, have your pharmacy contact our office with your prescription refill request.

## 2014-03-24 LAB — FOLATE: Folate: 4.6 ng/mL

## 2014-03-24 LAB — VITAMIN B12: VITAMIN B 12: 1053 pg/mL — AB (ref 211–911)

## 2014-03-24 LAB — FERRITIN: Ferritin: 402 ng/mL — ABNORMAL HIGH (ref 10–291)

## 2014-03-25 LAB — INTRINSIC FACTOR ANTIBODIES: Intrinsic Factor: NEGATIVE

## 2014-03-27 LAB — METHYLMALONIC ACID, SERUM: Methylmalonic Acid, Quantitative: 0.2 umol/L (ref <0.40)

## 2014-03-28 ENCOUNTER — Other Ambulatory Visit (HOSPITAL_COMMUNITY): Payer: Self-pay | Admitting: Oncology

## 2014-03-28 ENCOUNTER — Encounter (HOSPITAL_COMMUNITY): Payer: Self-pay | Admitting: Oncology

## 2014-03-28 DIAGNOSIS — E538 Deficiency of other specified B group vitamins: Secondary | ICD-10-CM

## 2014-03-28 HISTORY — DX: Deficiency of other specified B group vitamins: E53.8

## 2014-03-28 LAB — SOLUBLE TRANSFERRIN RECEPTOR: Transferrin Receptor, Soluble: 2.9 mg/L — ABNORMAL HIGH (ref 0.76–1.76)

## 2014-03-28 MED ORDER — FOLIC ACID 1 MG PO TABS: 1.0000 mg | ORAL_TABLET | Freq: Every day | ORAL | Status: DC

## 2014-03-29 ENCOUNTER — Other Ambulatory Visit (HOSPITAL_COMMUNITY): Payer: Self-pay | Admitting: Oncology

## 2014-03-29 DIAGNOSIS — D500 Iron deficiency anemia secondary to blood loss (chronic): Secondary | ICD-10-CM

## 2014-03-29 NOTE — Progress Notes (Signed)
Please let patient know I reviewed records from [facility]. She presented with acute renal failure at that time. Her HCTZ and Aldactone were stopped.  She is having increased lower extremity edema. Recent Cre 1.12.  1. We will add back low dose Aldactone 53m daily. Let me know if she needs new RX. 2. Recheck met-7 in 2 weeks. 3. She needs EGD in OR (due to h/o etoh abuse and deep sedation requirements in the past) with RMR. Reason: f/u esophageal varices.

## 2014-03-30 ENCOUNTER — Encounter (HOSPITAL_BASED_OUTPATIENT_CLINIC_OR_DEPARTMENT_OTHER): Payer: Medicaid Other

## 2014-03-30 ENCOUNTER — Ambulatory Visit (HOSPITAL_COMMUNITY): Payer: Medicaid Other

## 2014-03-30 VITALS — BP 110/64 | HR 78 | Temp 98.3°F | Resp 18

## 2014-03-30 DIAGNOSIS — D7589 Other specified diseases of blood and blood-forming organs: Secondary | ICD-10-CM

## 2014-03-30 DIAGNOSIS — D500 Iron deficiency anemia secondary to blood loss (chronic): Secondary | ICD-10-CM

## 2014-03-30 DIAGNOSIS — K7460 Unspecified cirrhosis of liver: Secondary | ICD-10-CM

## 2014-03-30 MED ORDER — SODIUM CHLORIDE 0.9 % IV SOLN
Freq: Once | INTRAVENOUS | Status: AC
  Administered 2014-03-30: 11:00:00 via INTRAVENOUS

## 2014-03-30 MED ORDER — SODIUM CHLORIDE 0.9 % IV SOLN
1020.0000 mg | Freq: Once | INTRAVENOUS | Status: AC
  Administered 2014-03-30: 1020 mg via INTRAVENOUS
  Filled 2014-03-30: qty 34

## 2014-03-30 NOTE — Progress Notes (Signed)
Tolerated well

## 2014-03-30 NOTE — Progress Notes (Signed)
Tried to call pt- LMOM 

## 2014-04-04 ENCOUNTER — Other Ambulatory Visit: Payer: Self-pay | Admitting: Internal Medicine

## 2014-04-04 NOTE — Progress Notes (Signed)
EGD w/Propofol is scheduled w/RMR on Thursday April 23rd at patient is aware and [facility] mailed instructions

## 2014-04-04 NOTE — Progress Notes (Signed)
Pt is aware of results. She will need a Rx send to [facility] for the Aldactone.  Leigh-Ann Can you set her up for a EGD.

## 2014-04-04 NOTE — Progress Notes (Signed)
Tried to call pt- LMOM 

## 2014-04-05 ENCOUNTER — Encounter (HOSPITAL_COMMUNITY): Payer: Self-pay | Admitting: Pharmacy Technician

## 2014-04-06 MED ORDER — SPIRONOLACTONE 50 MG PO TABS: 50.0000 mg | ORAL_TABLET | Freq: Every day | ORAL | Status: DC

## 2014-04-06 NOTE — Patient Instructions (Signed)
Gabriella Leon  04/06/2014   Your procedure is scheduled on:   04/13/2014  Report to [facility] at  1100  AM.  Call this number if you have problems the morning of surgery: [phone]   Remember:   Do not eat food or drink liquids after midnight.   Take these medicines the morning of surgery with A SIP OF WATER:  Acamprosate, flexaril, prilosec, propranolol   Do not wear jewelry, make-up or nail polish.  Do not wear lotions, powders, or perfumes.   Do not shave 48 hours prior to surgery. Men may shave face and neck.  Do not bring valuables to the hospital.  [facility] is not responsible for any belongings or valuables.               Contacts, dentures or bridgework may not be worn into surgery.  Leave suitcase in the car. After surgery it may be brought to your room.  For patients admitted to the hospital, discharge time is determined by your treatment team.               Patients discharged the day of surgery will not be allowed to drive home.  Name and phone number of your driver: family  Special Instructions: N/A   Please read over the following fact sheets that you were given: Pain Booklet, Coughing and Deep Breathing, Surgical Site Infection Prevention, Anesthesia Post-op Instructions and Care and Recovery After Surgery Esophagogastroduodenoscopy Esophagogastroduodenoscopy (EGD) is a procedure to examine the lining of the esophagus, stomach, and first part of the small intestine (duodenum). A long, flexible, lighted tube with a camera attached (endoscope) is inserted down the throat to view these organs. This procedure is done to detect problems or abnormalities, such as inflammation, bleeding, ulcers, or growths, in order to treat them. The procedure lasts about 5 20 minutes. It is usually an outpatient procedure, but it may need to be performed in emergency cases in the hospital. LET YOUR CAREGIVER KNOW ABOUT:   Allergies to food or medicine.  All medicines you are  taking, including vitamins, herbs, eyedrops, and over-the-counter medicines and creams.  Use of steroids (by mouth or creams).  Previous problems you or members of your family have had with the use of anesthetics.  Any blood disorders you have.  Previous surgeries you have had.  Other health problems you have.  Possibility of pregnancy, if this applies. RISKS AND COMPLICATIONS  Generally, EGD is a safe procedure. However, as with any procedure, complications can occur. Possible complications include:  Infection.  Bleeding.  Tearing (perforation) of the esophagus, stomach, or duodenum.  Difficulty breathing or not being able to breath.  Excessive sweating.  Spasms of the larynx.  Slowed heartbeat.  Low blood pressure. BEFORE THE PROCEDURE  Do not eat or drink anything for 6 8 hours before the procedure or as directed by your caregiver.  Ask your caregiver about changing or stopping your regular medicines.  If you wear dentures, be prepared to remove them before the procedure.  Arrange for someone to drive you home after the procedure. PROCEDURE   A vein will be accessed to give medicines and fluids. A medicine to relax you (sedative) and a pain reliever will be given through that access into the vein.  A numbing medicine (local anesthetic) may be sprayed on your throat for comfort and to stop you from gagging or coughing.  A mouth guard may be placed in your mouth to  protect your teeth and to keep you from biting on the endoscope.  You will be asked to lie on your left side.  The endoscope is inserted down your throat and into the esophagus, stomach, and duodenum.  Air is put through the endoscope to allow your caregiver to view the lining of your esophagus clearly.  The esophagus, stomach, and duodenum is then examined. During the exam, your caregiver may:  Remove tissue to be examined under a microscope (biopsy) for inflammation, infection, or other medical  problems.  Remove growths.  Remove objects (foreign bodies) that are stuck.  Treat any bleeding with medicines or other devices that stop tissues from bleeding (hot cauters, clipping devices).  Widen (dilate) or stretch narrowed areas of the esophagus and stomach.  The endoscope will then be withdrawn. AFTER THE PROCEDURE  You will be taken to a recovery area to be monitored. You will be able to go home once you are stable and alert.  Do not eat or drink anything until the local anesthetic and numbing medicines have worn off. You may choke.  It is normal to feel bloated, have pain with swallowing, or have a sore throat for a short time. This will wear off.  Your caregiver should be able to discuss his or her findings with you. It will take longer to discuss the test results if any biopsies were taken. Document Released: 04/10/2005 Document Revised: 11/24/2012 Document Reviewed: 11/10/2012 [facility] Patient Information 2014 [place], [place]. PATIENT INSTRUCTIONS POST-ANESTHESIA  IMMEDIATELY FOLLOWING SURGERY:  Do not drive or operate machinery for the first twenty four hours after surgery.  Do not make any important decisions for twenty four hours after surgery or while taking narcotic pain medications or sedatives.  If you develop intractable nausea and vomiting or a severe headache please notify your doctor immediately.  FOLLOW-UP:  Please make an appointment with your surgeon as instructed. You do not need to follow up with anesthesia unless specifically instructed to do so.  WOUND CARE INSTRUCTIONS (if applicable):  Keep a dry clean dressing on the anesthesia/puncture wound site if there is drainage.  Once the wound has quit draining you may leave it open to air.  Generally you should leave the bandage intact for twenty four hours unless there is drainage.  If the epidural site drains for more than 36-48 hours please call the anesthesia department.  QUESTIONS?:  Please feel free to  call your physician or the hospital operator if you have any questions, and they will be happy to assist you.

## 2014-04-06 NOTE — Addendum Note (Signed)
Addended by: Mahala Menghini on: 04/06/2014 07:44 AM   Modules accepted: Orders

## 2014-04-06 NOTE — Progress Notes (Signed)
Aldactone RX sent to [facility]. 

## 2014-04-07 ENCOUNTER — Encounter (HOSPITAL_COMMUNITY)
Admission: RE | Admit: 2014-04-07 | Discharge: 2014-04-07 | Disposition: A | Discharge status: Home / Self Care | Payer: Medicaid Other | Source: Ambulatory Visit | Attending: Internal Medicine | Admitting: Internal Medicine

## 2014-04-11 ENCOUNTER — Encounter (HOSPITAL_COMMUNITY): Admission: RE | Admit: 2014-04-11 | Payer: Medicaid Other | Source: Ambulatory Visit

## 2014-04-12 ENCOUNTER — Encounter (HOSPITAL_COMMUNITY)
Admission: RE | Admit: 2014-04-12 | Discharge: 2014-04-12 | Disposition: A | Discharge status: Home / Self Care | Payer: Medicaid Other | Source: Ambulatory Visit | Attending: Internal Medicine | Admitting: Internal Medicine

## 2014-04-12 ENCOUNTER — Other Ambulatory Visit: Payer: Self-pay

## 2014-04-12 ENCOUNTER — Encounter (HOSPITAL_COMMUNITY): Payer: Self-pay

## 2014-04-12 HISTORY — DX: Alcohol dependence, in remission: F10.21

## 2014-04-12 HISTORY — DX: Unspecified osteoarthritis, unspecified site: M19.90

## 2014-04-12 NOTE — Progress Notes (Signed)
04/12/14 1507  OBSTRUCTIVE SLEEP APNEA  Have you ever been diagnosed with sleep apnea through a sleep study? No  Do you snore loudly (loud enough to be heard through closed doors)?  1  Do you often feel tired, fatigued, or sleepy during the daytime? 1  Has anyone observed you stop breathing during your sleep? 0  Do you have, or are you being treated for high blood pressure? 1  BMI more than 35 kg/m2? 1  Age over 56 years old? 1  Neck circumference greater than 40 cm/16 inches? 0  Gender: 0  Obstructive Sleep Apnea Score 5

## 2014-04-13 ENCOUNTER — Ambulatory Visit (HOSPITAL_COMMUNITY)
Admission: RE | Admit: 2014-04-13 | Discharge: 2014-04-13 | Disposition: A | Discharge status: Home / Self Care | Payer: Medicaid Other | Source: Ambulatory Visit | Attending: Internal Medicine | Admitting: Internal Medicine

## 2014-04-13 ENCOUNTER — Encounter (HOSPITAL_COMMUNITY)
Admission: RE | Disposition: A | Discharge status: Home / Self Care | Payer: Self-pay | Source: Ambulatory Visit | Attending: Internal Medicine

## 2014-04-13 ENCOUNTER — Encounter (HOSPITAL_COMMUNITY): Payer: Self-pay | Admitting: *Deleted

## 2014-04-13 ENCOUNTER — Encounter (HOSPITAL_COMMUNITY): Payer: Medicaid Other | Admitting: Anesthesiology

## 2014-04-13 ENCOUNTER — Ambulatory Visit (HOSPITAL_COMMUNITY): Payer: Medicaid Other | Admitting: Anesthesiology

## 2014-04-13 DIAGNOSIS — K766 Portal hypertension: Secondary | ICD-10-CM | POA: Insufficient documentation

## 2014-04-13 DIAGNOSIS — K219 Gastro-esophageal reflux disease without esophagitis: Secondary | ICD-10-CM | POA: Insufficient documentation

## 2014-04-13 DIAGNOSIS — B182 Chronic viral hepatitis C: Secondary | ICD-10-CM | POA: Insufficient documentation

## 2014-04-13 DIAGNOSIS — K7460 Unspecified cirrhosis of liver: Secondary | ICD-10-CM | POA: Insufficient documentation

## 2014-04-13 DIAGNOSIS — I8510 Secondary esophageal varices without bleeding: Secondary | ICD-10-CM | POA: Insufficient documentation

## 2014-04-13 DIAGNOSIS — K449 Diaphragmatic hernia without obstruction or gangrene: Secondary | ICD-10-CM | POA: Insufficient documentation

## 2014-04-13 DIAGNOSIS — K319 Disease of stomach and duodenum, unspecified: Secondary | ICD-10-CM | POA: Insufficient documentation

## 2014-04-13 HISTORY — PX: ESOPHAGOGASTRODUODENOSCOPY (EGD) WITH PROPOFOL: SHX5813

## 2014-04-13 LAB — GLUCOSE, CAPILLARY: Glucose-Capillary: 153 mg/dL — ABNORMAL HIGH (ref 70–99)

## 2014-04-13 SURGERY — ESOPHAGOGASTRODUODENOSCOPY (EGD) WITH PROPOFOL
Anesthesia: Monitor Anesthesia Care

## 2014-04-13 MED ORDER — ONDANSETRON HCL 4 MG/2ML IJ SOLN
INTRAMUSCULAR | Status: AC
  Filled 2014-04-13: qty 2

## 2014-04-13 MED ORDER — MIDAZOLAM HCL 2 MG/2ML IJ SOLN
INTRAMUSCULAR | Status: AC
  Filled 2014-04-13: qty 2

## 2014-04-13 MED ORDER — LIDOCAINE VISCOUS 2 % MT SOLN
OROMUCOSAL | Status: AC
Start: 2014-04-13 — End: 2014-04-13
  Administered 2014-04-13: 3 mL via OROMUCOSAL
  Filled 2014-04-13: qty 15

## 2014-04-13 MED ORDER — LIDOCAINE VISCOUS 2 % MT SOLN
OROMUCOSAL | Status: AC
  Filled 2014-04-13: qty 15

## 2014-04-13 MED ORDER — FENTANYL CITRATE 0.05 MG/ML IJ SOLN
25.0000 ug | INTRAMUSCULAR | Status: AC
  Administered 2014-04-13: 25 ug via INTRAVENOUS

## 2014-04-13 MED ORDER — FENTANYL CITRATE 0.05 MG/ML IJ SOLN
INTRAMUSCULAR | Status: AC
  Filled 2014-04-13: qty 2

## 2014-04-13 MED ORDER — MIDAZOLAM HCL 2 MG/2ML IJ SOLN
1.0000 mg | INTRAMUSCULAR | Status: DC | PRN
  Administered 2014-04-13: 2 mg via INTRAVENOUS

## 2014-04-13 MED ORDER — LACTATED RINGERS IV SOLN
INTRAVENOUS | Status: DC
  Administered 2014-04-13: 11:00:00 via INTRAVENOUS

## 2014-04-13 MED ORDER — LIDOCAINE VISCOUS 2 % MT SOLN
3.0000 mL | Freq: Once | OROMUCOSAL | Status: AC
  Administered 2014-04-13: 3 mL via OROMUCOSAL
  Filled 2014-04-13: qty 15

## 2014-04-13 MED ORDER — PROPOFOL INFUSION 10 MG/ML OPTIME
INTRAVENOUS | Status: DC | PRN
  Administered 2014-04-13: 100 ug/kg/min via INTRAVENOUS

## 2014-04-13 MED ORDER — STERILE WATER FOR IRRIGATION IR SOLN
Status: DC | PRN
  Administered 2014-04-13: 11:00:00

## 2014-04-13 MED ORDER — ONDANSETRON HCL 4 MG/2ML IJ SOLN
4.0000 mg | Freq: Once | INTRAMUSCULAR | Status: AC
  Administered 2014-04-13: 4 mg via INTRAVENOUS

## 2014-04-13 MED ORDER — PROPOFOL 10 MG/ML IV BOLUS
INTRAVENOUS | Status: AC
  Filled 2014-04-13: qty 20

## 2014-04-13 MED ORDER — STERILE WATER FOR IRRIGATION IR SOLN: Status: DC | PRN

## 2014-04-13 SURGICAL SUPPLY — 21 items
BLOCK BITE 60FR ADLT L/F BLUE (MISCELLANEOUS) ×2 IMPLANT
DEVICE CLIP HEMOSTAT 235CM (CLIP) IMPLANT
ELECT REM PT RETURN 9FT ADLT (ELECTROSURGICAL)
ELECTRODE REM PT RTRN 9FT ADLT (ELECTROSURGICAL) IMPLANT
FLOOR PAD 36X40 (MISCELLANEOUS) ×2
FORCEPS BIOP RAD 4 LRG CAP 4 (CUTTING FORCEPS) IMPLANT
FORMALIN 10 PREFIL 20ML (MISCELLANEOUS) IMPLANT
KIT CLEAN ENDO COMPLIANCE (KITS) ×2 IMPLANT
MANIFOLD NEPTUNE II (INSTRUMENTS) ×2 IMPLANT
NEEDLE SCLEROTHERAPY 25GX240 (NEEDLE) IMPLANT
PAD FLOOR 36X40 (MISCELLANEOUS) ×1 IMPLANT
PROBE APC STR FIRE (PROBE) IMPLANT
PROBE INJECTION GOLD (MISCELLANEOUS)
PROBE INJECTION GOLD 7FR (MISCELLANEOUS) IMPLANT
SNARE ROTATE MED OVAL 20MM (MISCELLANEOUS) IMPLANT
SNARE SHORT THROW 13M SML OVAL (MISCELLANEOUS) ×2 IMPLANT
SYR 50ML LL SCALE MARK (SYRINGE) ×2 IMPLANT
SYR INFLATION 60ML (SYRINGE) ×2 IMPLANT
TUBING ENDO SMARTCAP PENTAX (MISCELLANEOUS) ×2 IMPLANT
TUBING IRRIGATION ENDOGATOR (MISCELLANEOUS) ×2 IMPLANT
WATER STERILE IRR 1000ML POUR (IV SOLUTION) ×2 IMPLANT

## 2014-04-13 NOTE — Anesthesia Postprocedure Evaluation (Signed)
  Anesthesia Post-op Note  Patient: Gabriella Leon  Procedure(s) Performed: Procedure(s): ESOPHAGOGASTRODUODENOSCOPY (EGD) WITH PROPOFOL (N/A)  Patient Location: PACU  Anesthesia Type:MAC  Level of Consciousness: awake and alert   Airway and Oxygen Therapy: Patient Spontanous Breathing and Patient connected to face mask oxygen  Post-op Pain: none  Post-op Assessment: Post-op Vital signs reviewed, Patient's Cardiovascular Status Stable, Respiratory Function Stable, Patent Airway and No signs of Nausea or vomiting  Post-op Vital Signs: Reviewed and stable  Last Vitals:  Filed Vitals:   04/13/14 1100  BP: 119/63  Pulse:   Temp:   Resp: 27    Complications: No apparent anesthesia complications

## 2014-04-13 NOTE — Anesthesia Preprocedure Evaluation (Signed)
Anesthesia Evaluation  Patient identified by MRN, date of birth, ID band Patient awake    Reviewed: Allergy & Precautions, H&P , NPO status , Patient's Chart, lab work & pertinent test results, reviewed documented beta blocker date and time   History of Anesthesia Complications Negative for: history of anesthetic complications  Airway Mallampati: II      Dental  (+) Edentulous Upper, Edentulous Lower   Pulmonary Current Smoker,  breath sounds clear to auscultation        Cardiovascular hypertension, Pt. on medications Rhythm:Regular Rate:Normal     Neuro/Psych PSYCHIATRIC DISORDERS (active alcoholism) Anxiety Depression    GI/Hepatic GERD-  Medicated,(+) Cirrhosis -    substance abuse  alcohol use, Hepatitis -, B  Endo/Other  diabetes  Renal/GU      Musculoskeletal   Abdominal   Peds  Hematology  (+) Blood dyscrasia, anemia ,   Anesthesia Other Findings   Reproductive/Obstetrics                           Anesthesia Physical Anesthesia Plan  ASA: IV  Anesthesia Plan: MAC   Post-op Pain Management:    Induction: Intravenous  Airway Management Planned: Simple Face Mask  Additional Equipment:   Intra-op Plan:   Post-operative Plan:   Informed Consent: I have reviewed the patients History and Physical, chart, labs and discussed the procedure including the risks, benefits and alternatives for the proposed anesthesia with the patient or authorized representative who has indicated his/her understanding and acceptance.     Plan Discussed with:   Anesthesia Plan Comments:         Anesthesia Quick Evaluation

## 2014-04-13 NOTE — Discharge Instructions (Addendum)
EGD Discharge instructions Please read the instructions outlined below and refer to this sheet in the next few weeks. These discharge instructions provide you with general information on caring for yourself after you leave the hospital. Your doctor may also give you specific instructions. While your treatment has been planned according to the most current medical practices available, unavoidable complications occasionally occur. If you have any problems or questions after discharge, please call your doctor. ACTIVITY  You may resume your regular activity but move at a slower pace for the next 24 hours.   Take frequent rest periods for the next 24 hours.   Walking will help expel (get rid of) the air and reduce the bloated feeling in your abdomen.   No driving for 24 hours (because of the anesthesia (medicine) used during the test).   You may shower.   Do not sign any important legal documents or operate any machinery for 24 hours (because of the anesthesia used during the test).  NUTRITION  Drink plenty of fluids.   You may resume your normal diet.   Begin with a light meal and progress to your normal diet.   Avoid alcoholic beverages for 24 hours or as instructed by your caregiver.  MEDICATIONS  You may resume your normal medications unless your caregiver tells you otherwise.  WHAT YOU CAN EXPECT TODAY  You may experience abdominal discomfort such as a feeling of fullness or gas pains.  FOLLOW-UP  Your doctor will discuss the results of your test with you.  SEEK IMMEDIATE MEDICAL ATTENTION IF ANY OF THE FOLLOWING OCCUR:  Excessive nausea (feeling sick to your stomach) and/or vomiting.   Severe abdominal pain and distention (swelling).   Trouble swallowing.   Temperature over 101 F (37.8 C).   Rectal bleeding or vomiting of blood.     Continue propranolol/Inderal daily  Decrease omeprazole 20 mg twice daily  GERD information provided  Office visit with [place] in 6  months Gastroesophageal Reflux Disease, Adult Gastroesophageal reflux disease (GERD) happens when acid from your stomach flows up into the esophagus. When acid comes in contact with the esophagus, the acid causes soreness (inflammation) in the esophagus. Over time, GERD may create small holes (ulcers) in the lining of the esophagus. CAUSES   Increased body weight. This puts pressure on the stomach, making acid rise from the stomach into the esophagus.  Smoking. This increases acid production in the stomach.  Drinking alcohol. This causes decreased pressure in the lower esophageal sphincter (valve or ring of muscle between the esophagus and stomach), allowing acid from the stomach into the esophagus.  Late evening meals and a full stomach. This increases pressure and acid production in the stomach.  A malformed lower esophageal sphincter. Sometimes, no cause is found. SYMPTOMS   Burning pain in the lower part of the mid-chest behind the breastbone and in the mid-stomach area. This may occur twice a week or more often.  Trouble swallowing.  Sore throat.  Dry cough.  Asthma-like symptoms including chest tightness, shortness of breath, or wheezing. DIAGNOSIS  Your caregiver may be able to diagnose GERD based on your symptoms. In some cases, X-rays and other tests may be done to check for complications or to check the condition of your stomach and esophagus. TREATMENT  Your caregiver may recommend over-the-counter or prescription medicines to help decrease acid production. Ask your caregiver before starting or adding any new medicines.  HOME CARE INSTRUCTIONS   Change the factors that you can control. Ask  your caregiver for guidance concerning weight loss, quitting smoking, and alcohol consumption.  Avoid foods and drinks that make your symptoms worse, such as:  Caffeine or alcoholic drinks.  Chocolate.  Peppermint or mint flavorings.  Garlic and onions.  Spicy foods.  Citrus  fruits, such as oranges, lemons, or limes.  Tomato-based foods such as sauce, chili, salsa, and pizza.  Fried and fatty foods.  Avoid lying down for the 3 hours prior to your bedtime or prior to taking a nap.  Eat small, frequent meals instead of large meals.  Wear loose-fitting clothing. Do not wear anything tight around your waist that causes pressure on your stomach.  Raise the head of your bed 6 to 8 inches with wood blocks to help you sleep. Extra pillows will not help.  Only take over-the-counter or prescription medicines for pain, discomfort, or fever as directed by your caregiver.  Do not take aspirin, ibuprofen, or other nonsteroidal anti-inflammatory drugs (NSAIDs). SEEK IMMEDIATE MEDICAL CARE IF:   You have pain in your arms, neck, jaw, teeth, or back.  Your pain increases or changes in intensity or duration.  You develop nausea, vomiting, or sweating (diaphoresis).  You develop shortness of breath, or you faint.  Your vomit is green, yellow, black, or looks like coffee grounds or blood.  Your stool is red, bloody, or black. These symptoms could be signs of other problems, such as heart disease, gastric bleeding, or esophageal bleeding. MAKE SURE YOU:   Understand these instructions.  Will watch your condition.  Will get help right away if you are not doing well or get worse. Document Released: 09/17/2005 Document Revised: 03/01/2012 Document Reviewed: 06/27/2011 [facility] Patient Information 2014 [place], [place].

## 2014-04-13 NOTE — Op Note (Signed)
[facility] [address] [place], [address]   ENDOSCOPY PROCEDURE REPORT  PATIENT: Gabriella Leon, Gabriella Leon  MR#: [id number] BIRTHDATE: [birth year] , 30  yrs. old GENDER: Female ENDOSCOPIST: R.  Garfield Cornea, MD FACP [facility] REFERRED BY:  Farrel Gobble, MD PROCEDURE DATE:  04/13/2014 PROCEDURE:     Diagnostic EGD  INDICATIONS:     Refractory GERD; history of esophageal varices/portal gastropathy/chronic hepatitis C  INFORMED CONSENT:   The risks, benefits, limitations, alternatives and imponderables have been discussed.  The potential for biopsy, esophogeal dilation, etc. have also been reviewed.  Questions have been answered.  All parties agreeable.  Please see the history and physical in the medical record for more information.  MEDICATIONS:     Deep sedation per Dr. [facility]  DESCRIPTION OF PROCEDURE:   The     endoscope was introduced through the mouth and advanced to the second portion of the duodenum without difficulty or limitations.  The mucosal surfaces were surveyed very carefully during advancement of the scope and upon withdrawal.  Retroflexion view of the proximal stomach and esophagogastric junction was performed.      FINDINGS: 3 columns of grade 1-2 esophageal varices. Overlying mucosa appeared normal. EG junction patulous. Stomach empty. 3 cm hiatal hernia. Diffuse snakeskin or fish scale appearance of the gastric mucosa consistent with portal gastropathy. Patent pylorus. Normal first and second portion of the duodenum  THERAPEUTIC / DIAGNOSTIC MANEUVERS PERFORMED:  None   COMPLICATIONS:  None  IMPRESSION:    grade 1-2 esophageal varices without bleeding stigmata. Hiatal hernia. Portal gastropathy.  RECOMMENDATIONS:  Increase omeprazole to 20 mg twice daily. Continue propranolol/Inderal. As long as patient does not have a variceal hemorrhage, no need for future EGDs unless new symptoms arise. Office visit with [place] in 6  months.    _______________________________ R. Garfield Cornea, MD FACP [facility] eSigned:  R. Garfield Cornea, MD FACP [facility] 04/13/2014 11:37 AM     CC:  PATIENT NAME:  [name], [name] MR#: [id number]

## 2014-04-13 NOTE — Transfer of Care (Signed)
Immediate Anesthesia Transfer of Care Note  Patient: Gabriella Leon  Procedure(s) Performed: Procedure(s): ESOPHAGOGASTRODUODENOSCOPY (EGD) WITH PROPOFOL (N/A)  Patient Location: PACU  Anesthesia Type:MAC  Level of Consciousness: awake  Airway & Oxygen Therapy: Patient Spontanous Breathing  Post-op Assessment: Report given to PACU RN  Post vital signs: Reviewed  Complications: No apparent anesthesia complications

## 2014-04-13 NOTE — Interval H&P Note (Signed)
History and Physical Interval Note:  04/13/2014 11:05 AM  Gabriella Leon  has presented today for surgery, with the diagnosis of CIRRHOSIS SCREENING FOR VARICES  The various methods of treatment have been discussed with the patient and family. After consideration of risks, benefits and other options for treatment, the patient has consented to  Procedure(s) with comments: ESOPHAGOGASTRODUODENOSCOPY (EGD) WITH PROPOFOL (N/A) - 12:30 - moved to 11:00 - Darius Bump notified pt of time change as a surgical intervention .  The patient's history has been reviewed, patient examined, no change in status, stable for surgery.  I have reviewed the patient's chart and labs.  Questions were answered to the patient's satisfaction.   No change. EGD per plan.The risks, benefits, limitations, alternatives and imponderables have been reviewed with the patient. Potential for esophageal dilation, biopsy, etc. have also been reviewed.  Questions have been answered. All parties agreeable.  Cristopher Estimable Talecia Sherlin

## 2014-04-13 NOTE — H&P (View-Only) (Signed)
Aldactone RX sent to [facility].

## 2014-04-14 ENCOUNTER — Encounter (HOSPITAL_COMMUNITY): Payer: Self-pay | Admitting: Internal Medicine

## 2014-04-18 NOTE — Interval H&P Note (Signed)
History and Physical Interval Note:  04/18/2014 9:52 AM  Gabriella Leon  has presented today for surgery, with the diagnosis of CIRRHOSIS SCREENING FOR VARICES  The various methods of treatment have been discussed with the patient and family. After consideration of risks, benefits and other options for treatment, the patient has consented to  Procedure(s): ESOPHAGOGASTRODUODENOSCOPY (EGD) WITH PROPOFOL (N/A) as a surgical intervention .  The patient's history has been reviewed, patient examined, no change in status, stable for surgery.  I have reviewed the patient's chart and labs.  Questions were answered to the patient's satisfaction.     Cristopher Estimable Ladoris Lythgoe

## 2014-04-18 NOTE — Progress Notes (Signed)
Faxed EKG to PCP.

## 2014-05-11 ENCOUNTER — Emergency Department (HOSPITAL_COMMUNITY): Payer: Medicaid Other

## 2014-05-11 ENCOUNTER — Encounter (HOSPITAL_COMMUNITY): Payer: Self-pay | Admitting: Emergency Medicine

## 2014-05-11 ENCOUNTER — Emergency Department (HOSPITAL_COMMUNITY)
Admission: EM | Admit: 2014-05-11 | Discharge: 2014-05-11 | Disposition: A | Discharge status: Home / Self Care | Payer: Medicaid Other | Attending: Emergency Medicine | Admitting: Emergency Medicine

## 2014-05-11 DIAGNOSIS — M129 Arthropathy, unspecified: Secondary | ICD-10-CM | POA: Insufficient documentation

## 2014-05-11 DIAGNOSIS — E119 Type 2 diabetes mellitus without complications: Secondary | ICD-10-CM | POA: Insufficient documentation

## 2014-05-11 DIAGNOSIS — Z79899 Other long term (current) drug therapy: Secondary | ICD-10-CM | POA: Insufficient documentation

## 2014-05-11 DIAGNOSIS — Z7982 Long term (current) use of aspirin: Secondary | ICD-10-CM | POA: Insufficient documentation

## 2014-05-11 DIAGNOSIS — F329 Major depressive disorder, single episode, unspecified: Secondary | ICD-10-CM | POA: Insufficient documentation

## 2014-05-11 DIAGNOSIS — K219 Gastro-esophageal reflux disease without esophagitis: Secondary | ICD-10-CM | POA: Insufficient documentation

## 2014-05-11 DIAGNOSIS — F17200 Nicotine dependence, unspecified, uncomplicated: Secondary | ICD-10-CM | POA: Insufficient documentation

## 2014-05-11 DIAGNOSIS — R209 Unspecified disturbances of skin sensation: Secondary | ICD-10-CM | POA: Insufficient documentation

## 2014-05-11 DIAGNOSIS — F1021 Alcohol dependence, in remission: Secondary | ICD-10-CM | POA: Insufficient documentation

## 2014-05-11 DIAGNOSIS — R202 Paresthesia of skin: Secondary | ICD-10-CM

## 2014-05-11 DIAGNOSIS — Z8619 Personal history of other infectious and parasitic diseases: Secondary | ICD-10-CM | POA: Insufficient documentation

## 2014-05-11 DIAGNOSIS — F3289 Other specified depressive episodes: Secondary | ICD-10-CM | POA: Insufficient documentation

## 2014-05-11 DIAGNOSIS — I10 Essential (primary) hypertension: Secondary | ICD-10-CM | POA: Insufficient documentation

## 2014-05-11 DIAGNOSIS — Z872 Personal history of diseases of the skin and subcutaneous tissue: Secondary | ICD-10-CM | POA: Insufficient documentation

## 2014-05-11 HISTORY — DX: Unspecified cirrhosis of liver: K74.60

## 2014-05-11 HISTORY — DX: Other intervertebral disc degeneration, lumbar region without mention of lumbar back pain or lower extremity pain: M51.369

## 2014-05-11 HISTORY — DX: Other intervertebral disc degeneration, lumbar region: M51.36

## 2014-05-11 LAB — I-STAT CHEM 8, ED
BUN: 16 mg/dL (ref 6–23)
CHLORIDE: 103 meq/L (ref 96–112)
CREATININE: 1.5 mg/dL — AB (ref 0.50–1.10)
Calcium, Ion: 1.21 mmol/L (ref 1.12–1.23)
Glucose, Bld: 103 mg/dL — ABNORMAL HIGH (ref 70–99)
HCT: 32 % — ABNORMAL LOW (ref 36.0–46.0)
HEMOGLOBIN: 10.9 g/dL — AB (ref 12.0–15.0)
POTASSIUM: 3.6 meq/L — AB (ref 3.7–5.3)
SODIUM: 138 meq/L (ref 137–147)
TCO2: 21 mmol/L (ref 0–100)

## 2014-05-11 LAB — CBG MONITORING, ED: Glucose-Capillary: 159 mg/dL — ABNORMAL HIGH (ref 70–99)

## 2014-05-11 MED ORDER — POTASSIUM CHLORIDE CRYS ER 20 MEQ PO TBCR
20.0000 meq | EXTENDED_RELEASE_TABLET | Freq: Once | ORAL | Status: AC
  Administered 2014-05-11: 20 meq via ORAL
  Filled 2014-05-11: qty 1

## 2014-05-11 NOTE — ED Provider Notes (Signed)
CSN: [id number]     Arrival date & time 05/11/14  1623 History   First MD Initiated Contact with Patient 05/11/14 1911     Chief Complaint  Patient presents with  . Arm Pain     HPI Pt was seen at 1930. Per pt and her husband, c/o gradual onset and persistence of constant LUE "tingling" and "numbness" for the past 1 to 2 months. Pt describes her symptom as her "whole arm" from her shoulder to her forearm, then the palm of her hand and palmar fingertips "is numb." Pt states she was admitted to [facility] last week for "a diabetic problem" and told them about her symptoms. Pt and her husband state "they just said it was arthritis" and "because of my diabetes." They cannot recall if "they did any CT scans or xrays to check it out" and are requesting one tonight. Pt states she has been to her PMD for same and "he just says it's arthritis too."  Denies any change in her symptoms. Denies focal motor weakness, no injury, no CP/SOB, no fevers, no rash.    Past Medical History  Diagnosis Date  . Diabetes mellitus   . Hypertension   . Cirrhosis   . Hepatitis B antibody positive   . Ulcer     ?  [name] Acid reflux disease with ulcer   . Depression   . Helicobacter pylori ab+     July 2010, s/p treatment  . GERD (gastroesophageal reflux disease)   . Gallstones   . Anxiety   . Blood transfusion   . Hepatitis C     and B positive antibody  . Other pancytopenia 10/27/2013  . Folate deficiency 03/28/2014    Noted on 03/23/2014.  Folate 1 mg prescribed.  . Sleep apnea     Stop Bang score of 5  . History of alcoholism   . Arthritis   . Liver cirrhosis   . DDD (degenerative disc disease), lumbar    Past Surgical History  Procedure Laterality Date  . Cesarean section  1988    [facility]  . Cholecystectomy  greater than 10 yrs    [facility]  . Appendectomy      age 66  . Esophageal biopsy  12/18/2011       . Colonoscopy  12/18/11    minimal anal canal hemorrhoids, friable rectal and colonic mucosa,  left-sided diverticulosis, repeat in 2022.   [name] Esophagogastroduodenoscopy  12/18/11    3 columns of Grade II esophageal varices, reflux esohpagitis, portal gastropathy, path with chronic gastritis, negative H.pylori, surveillance in June 2014  . Multiple extractions with alveoloplasty  03/08/2012    Procedure: MULTIPLE EXTRACION WITH ALVEOLOPLASTY;  Surgeon: Gae Bon, DDS;  Location: [facility];  Service: Oral Surgery;  Laterality: N/A;  . Esophagogastroduodenoscopy (egd) with propofol N/A 04/13/2014    Procedure: ESOPHAGOGASTRODUODENOSCOPY (EGD) WITH PROPOFOL;  Surgeon: Daneil Dolin, MD;  Location: [facility];  Service: Endoscopy;  Laterality: N/A;   Family History  Problem Relation Age of Onset  . Diabetes Mother   . Cancer Paternal Uncle     passed away age 44  . Anesthesia problems Neg Hx   . Hypotension Neg Hx   . Malignant hyperthermia Neg Hx   . Pseudochol deficiency Neg Hx    History  Substance Use Topics  . Smoking status: Current Every Day Smoker -- 0.50 packs/day for 20 years    Types: Cigarettes  . Smokeless tobacco: Never Used  . Alcohol Use:  No     Comment: Hx 1/2 pint gin daily, patient states "has  cut back alot" . Last etoh 02/2014    Review of Systems ROS: Statement: All systems negative except as marked or noted in the HPI; Constitutional: Negative for fever and chills. ; ; Eyes: Negative for eye pain, redness and discharge. ; ; ENMT: Negative for ear pain, hoarseness, nasal congestion, sinus pressure and sore throat. ; ; Cardiovascular: Negative for chest pain, palpitations, diaphoresis, dyspnea and peripheral edema. ; ; Respiratory: Negative for cough, wheezing and stridor. ; ; Gastrointestinal: Negative for nausea, vomiting, diarrhea, abdominal pain, blood in stool, hematemesis, jaundice and rectal bleeding. . ; ; Genitourinary: Negative for dysuria, flank pain and hematuria. ; ; Musculoskeletal: Negative for back pain and neck pain. Negative for swelling and trauma.; ;  Skin: Negative for pruritus, rash, abrasions, blisters, bruising and skin lesion.; ; Neuro: +paresthesia LUE. Negative for headache, lightheadedness and neck stiffness. Negative for weakness, altered level of consciousness , altered mental status, extremity weakness, involuntary movement, seizure and syncope.      Allergies  Review of patient's allergies indicates no known allergies.  Home Medications   Prior to Admission medications   Medication Sig Start Date End Date Taking? Authorizing Provider  acamprosate (CAMPRAL) 333 MG tablet Take 333 mg by mouth 3 (three) times daily with meals.    Yes Historical Provider, MD  aspirin EC 81 MG tablet Take 81 mg by mouth every morning.   Yes Historical Provider, MD  cyclobenzaprine (FLEXERIL) 10 MG tablet Take 10 mg by mouth 3 (three) times daily as needed for muscle spasms.   Yes Historical Provider, MD  folic acid (FOLVITE) 1 MG tablet Take 1 tablet (1 mg total) by mouth daily. 03/28/14  Yes Manon Hilding Kefalas, PA-C  furosemide (LASIX) 40 MG tablet Take 40 mg by mouth daily.    Yes Historical Provider, MD  glimepiride (AMARYL) 2 MG tablet Take 2 mg by mouth daily.    Yes Historical Provider, MD  HYDROcodone-acetaminophen (NORCO/VICODIN) 5-325 MG per tablet Take 1 tablet by mouth every 6 (six) hours as needed for moderate pain.   Yes Historical Provider, MD  pantoprazole (PROTONIX) 40 MG tablet Take 40 mg by mouth every morning.   Yes Historical Provider, MD  propranolol (INDERAL) 20 MG tablet Take 1 tablet (20 mg total) by mouth 2 (two) times daily. 06/16/13  Yes Orvil Feil, NP  spironolactone (ALDACTONE) 50 MG tablet Take 1 tablet (50 mg total) by mouth daily. 04/06/14  Yes Mahala Menghini, PA-C   BP 103/71  Pulse 66  Temp(Src) 98.1 F (36.7 C) (Oral)  Resp 16  SpO2 100% Physical Exam 1935: Physical examination:  Nursing notes reviewed; Vital signs and O2 SAT reviewed;  Constitutional: Well developed, Well nourished, Well hydrated, In no acute  distress; Head:  Normocephalic, atraumatic; Eyes: EOMI, PERRL, No scleral icterus; ENMT: Mouth and pharynx normal, Mucous membranes moist; Neck: Supple, Full range of motion, No lymphadenopathy; Cardiovascular: Regular rate and rhythm, No gallop; Respiratory: Breath sounds clear & equal bilaterally, No rales, rhonchi, wheezes.  Speaking full sentences with ease, Normal respiratory effort/excursion; Chest: Nontender, Movement normal; Abdomen: Soft, Nontender, Nondistended, Normal bowel sounds; Genitourinary: No CVA tenderness; Spine:  No midline CS, TS, LS tenderness. +TTP left hypertonic trapezius muscle.;; Extremities: Pulses normal, No deformity, no tenderness, No edema, NT to palp left shoulder/elbow/wrist/hand. +Tinel's at left wrist and elbow. Motor strength at shoulder normal.  Sensation intact but decreased over left deltoid  region compared to right side. Distal NMS intact with left hand having intact but decreased sensation and equal strength in the distribution of the median, radial, and ulnar nerve function compared to opposite side.  Strong radial pulses.  +FROM left elbow with intact motor strength biceps and triceps muscles to resistance.; Neuro: AA&Ox3, Major CN grossly intact. No facial droop. Speech clear. Grips equal. Strength 5/5 equal bilat UE's and LE's. No gross focal motor deficits in extremities.; Skin: Color normal, Warm, Dry.   ED Course  Procedures     EKG Interpretation None      MDM  MDM Reviewed: previous chart, nursing note and vitals Reviewed previous: CT scan and labs Interpretation: labs and CT scan    Results for orders placed during the hospital encounter of 05/11/14  CBG MONITORING, ED      Result Value Ref Range   Glucose-Capillary 159 (*) 70 - 99 mg/dL  I-STAT CHEM 8, ED      Result Value Ref Range   Sodium 138  137 - 147 mEq/L   Potassium 3.6 (*) 3.7 - 5.3 mEq/L   Chloride 103  96 - 112 mEq/L   BUN 16  6 - 23 mg/dL   Creatinine, Ser 1.50 (*) 0.50 -  1.10 mg/dL   Glucose, Bld 103 (*) 70 - 99 mg/dL   Calcium, Ion 1.21  1.12 - 1.23 mmol/L   TCO2 21  0 - 100 mmol/L   Hemoglobin 10.9 (*) 12.0 - 15.0 g/dL   HCT 32.0 (*) 36.0 - 46.0 %   Ct Head Wo Contrast 05/11/2014   CLINICAL DATA:  Left arm numbness/paresthesias with neck pain  EXAM: CT HEAD WITHOUT CONTRAST  CT CERVICAL SPINE WITHOUT CONTRAST  TECHNIQUE: Multidetector CT imaging of the head and cervical spine was performed following the standard protocol without intravenous contrast. Multiplanar CT image reconstructions of the cervical spine were also generated.  COMPARISON:  CT head May 06, 2014  FINDINGS: CT HEAD FINDINGS  Ventricles are normal in size and configuration. There is no mass, hemorrhage, extra-axial fluid collection, or midline shift. There is mild patchy small vessel disease in the centra semiovale bilaterally. No acute appearing infarct is seen. The bony calvarium appears intact. The mastoid air cells are clear. There is mild leftward deviation of the nasal septum.  CT CERVICAL SPINE FINDINGS  There is no fracture or spondylolisthesis. Prevertebral soft tissues and predental space regions are normal. Disk spaces appear intact. There is no appreciable nerve root edema or effacement on this study. No disc extrusion or stenosis apparent. There is calcification in the left carotid artery. There is a focal calcification in the posterior right pharynx at the level of the posterior vallecula.  IMPRESSION: CT head: Mild patchy periventricular small vessel disease. No intracranial mass, hemorrhage, or acute infarct. There is leftward deviation of the nasal septum.  CT cervical spine: No fracture or spondylolisthesis. No disc extrusion or stenosis. No appreciable nerve root edema or effacement.  Calcification posterior to the right vallecular of measuring 8 x 7 mm. The etiology for this calcification is uncertain. It probably has dystrophic etiology. There is no air tracking from this calcification.   There is calcification in the left carotid artery.   Electronically Signed   By: Lowella Grip M.D.   On: 05/11/2014 20:35   Ct Cervical Spine Wo Contrast 05/11/2014   CLINICAL DATA:  Left arm numbness/paresthesias with neck pain  EXAM: CT HEAD WITHOUT CONTRAST  CT CERVICAL SPINE WITHOUT CONTRAST  TECHNIQUE: Multidetector CT imaging of the head and cervical spine was performed following the standard protocol without intravenous contrast. Multiplanar CT image reconstructions of the cervical spine were also generated.  COMPARISON:  CT head May 06, 2014  FINDINGS: CT HEAD FINDINGS  Ventricles are normal in size and configuration. There is no mass, hemorrhage, extra-axial fluid collection, or midline shift. There is mild patchy small vessel disease in the centra semiovale bilaterally. No acute appearing infarct is seen. The bony calvarium appears intact. The mastoid air cells are clear. There is mild leftward deviation of the nasal septum.  CT CERVICAL SPINE FINDINGS  There is no fracture or spondylolisthesis. Prevertebral soft tissues and predental space regions are normal. Disk spaces appear intact. There is no appreciable nerve root edema or effacement on this study. No disc extrusion or stenosis apparent. There is calcification in the left carotid artery. There is a focal calcification in the posterior right pharynx at the level of the posterior vallecula.  IMPRESSION: CT head: Mild patchy periventricular small vessel disease. No intracranial mass, hemorrhage, or acute infarct. There is leftward deviation of the nasal septum.  CT cervical spine: No fracture or spondylolisthesis. No disc extrusion or stenosis. No appreciable nerve root edema or effacement.  Calcification posterior to the right vallecular of measuring 8 x 7 mm. The etiology for this calcification is uncertain. It probably has dystrophic etiology. There is no air tracking from this calcification.  There is calcification in the left carotid  artery.   Electronically Signed   By: Lowella Grip M.D.   On: 05/11/2014 20:35    2055:  H/H per baseline. Potassium mildly low; repleted PO. Doubt CVA as cause for LUE numbness with reassuring CT-H x2 in the past 2 weeks. Pt wants to go home now. Dx and testing d/w pt and family.  Questions answered.  Verb understanding, agreeable to d/c home with outpt f/u.       Alfonzo Feller, DO 05/14/14 1257

## 2014-05-11 NOTE — ED Notes (Addendum)
Pain/numbness lt arm Recently d/c from [facility] [place] with diabetic problem , says she was  told the problem was arthritis causing arm pain.  Increased pain with motion

## 2014-05-11 NOTE — Discharge Instructions (Signed)
°Emergency Department Resource Guide °1) Find a Doctor and Pay Out of Pocket °Although you won't have to find out who is covered by your insurance plan, it is a good idea to ask around and get recommendations. You will then need to call the office and see if the doctor you have chosen will accept you as a new patient and what types of options they offer for patients who are self-pay. Some doctors offer discounts or will set up payment plans for their patients who do not have insurance, but you will need to ask so you aren't surprised when you get to your appointment. ° °2) Contact Your Local Health Department °Not all health departments have doctors that can see patients for sick visits, but many do, so it is worth a call to see if yours does. If you don't know where your local health department is, you can check in your phone book. The CDC also has a tool to help you locate your state's health department, and many state websites also have listings of all of their local health departments. ° °3) Find a Walk-in Clinic °If your illness is not likely to be very severe or complicated, you may want to try a walk in clinic. These are popping up all over the country in pharmacies, drugstores, and shopping centers. They're usually staffed by nurse practitioners or physician assistants that have been trained to treat common illnesses and complaints. They're usually fairly quick and inexpensive. However, if you have serious medical issues or chronic medical problems, these are probably not your best option. ° °No Primary Care Doctor: °- Call Health Connect at  832-8000 - they can help you locate a primary care doctor that  accepts your insurance, provides certain services, etc. °- Physician Referral Service- 1-800-533-3463 ° °Chronic Pain Problems: °Organization         Address  Phone   Notes  °[facility]  (336) 297-2271 Patients need to be referred by their primary care doctor.  ° °Medication  Assistance: °Organization         Address  Phone   Notes  °[place] [facility] [address] °[place], [place] [address] (336) 641-8030 --Must be a resident of [place] °-- Must have NO insurance coverage whatsoever (no Medicaid/ Medicare, etc.) °-- The pt. MUST have a primary care doctor that directs their care regularly and follows them in the community °  °[facility]  (866) 331-1348   °[employer]  (888) 892-1162   ° °Agencies that provide inexpensive medical care: °Organization         Address  Phone   Notes  °[facility]  (336) 832-8035   °[facility]    (336) 832-7272   °[facility] [address] °[place], [place] [address] (336) 832-4777   °[facility] [address], °[place] (336) 271-4999   °[facility]    (336) 373-0678   °[facility]    (336) 272-1050   °[facility] ° [address], [place] Phone:  (336) 832-4444, Fax:  (336) 832-4440 Hours of Operation:  9 am - 6 pm, M-F.  Also accepts Medicaid/Medicare and self-pay.  °[facility] ° [address], [place] Phone: (336) 832-3150, Fax: (336) [phone]. Hours of Operation:  8:30 am - 5:30 pm, M-F.  Also accepts Medicaid and self-pay.  °[facility] [place] [address], [place] Phone: (336) 878-6027   °[facility] [address], [place], [place] (336)723-1848, Ext. 123 Mondays & Thursdays: 7-9 AM.  First 15 patients are seen on a first come, first serve basis. °  ° °Medicaid-accepting [place] Providers: ° °Organization         Address  Phone   Notes  °[facility] [address], [place] (336) 641-2100 Also accepts self-pay patients.  °[facility] [address], [place] ° (336) 856-9996   °[facility] [address], [place]  (336) 288-8857   °[facility] [address] [place] [address], [place] (336) 299-7000   °Veita Bland [address], [place]  ° (336) 373-1557 Only accepts  Access Medicaid patients after they have their name applied to their card.  ° °Self-Pay (no insurance) in [place]: ° °Organization         Address  Phone   Notes  °Sickle Cell Patients, [facility] [address], [place] (336) 832-1970   °[facility] [address], [place] (336) 832-4400   °[facility] ° [address], [place] (336) 992-4800   °[facility]/Dr. Osei-Bonsu ° [address] [place] [address], [place] or [address], [place] (336) [phone] Phone number for both [place] and [place] locations is the same.  °[facility] [address], [place] (336) 299-0000   °[facility] [address] [place] [address], [place] or [address] (336) 852-7530 °(336) 878-2260   °[facility] [address], [place] (336) 350-1642, phone; (336) 294-5005, fax Sees patients 1st and 3rd Saturday of every month.  Must not qualify for public or private insurance (i.e. Medicaid, Medicare, [place] Health Choice, Veterans' Benefits) • Household income should be no more than 200% of the poverty level •The clinic cannot treat you if you are pregnant or think you are pregnant • Sexually transmitted diseases are not treated at the clinic.  ° ° °Dental Care: °Organization         Address  Phone  Notes  °[place] [facility] [address], [place] (336) 641-6152 Accepts children up to age 21 who are enrolled in Medicaid or [place] Health Choice; pregnant women with a Medicaid card; and children who have applied for Medicaid or [place] Health Choice, but were declined, whose parents can pay a reduced fee at time of service.  °[place]  [facility] [place]  [address], [place] (336) 641-7733 Accepts children up to age 21 who are enrolled in Medicaid or [place] Health Choice; pregnant women with a Medicaid card; and children who have applied for Medicaid or [place] Health Choice, but were declined, whose parents can pay a reduced fee at time of service.  °[facility] ° [address],  (336) 641-4533 Patients are seen by appointment only. Walk-ins are not accepted. [facility] will see patients 18 years of age and older. °Monday - Tuesday (8am-5pm) °Most Wednesdays (8:30-5pm) °$30 per visit, cash only  °[facility] ° [address], [place] (336) 641-4533 Patients are seen by appointment only. Walk-ins are not accepted. [facility] will see patients 18 years of age and older. °One   Wednesday Evening (Monthly: Volunteer Based).  $30 per visit, cash only  °[facility]  (919) [phone] for adults; Children under age 4, call Graduate Pediatric Dentistry at ([phone]) [phone]. Children aged 4-14, please call ([phone]) [phone] to request a pediatric application. ° Dental services are provided in all areas of dental care including fillings, crowns and bridges, complete and partial dentures, implants, gum treatment, root canals, and extractions. Preventive care is also provided. Treatment is provided to both adults and children. °Patients are selected via a lottery and there is often a waiting list. °  °[facility] [address], °[place] ° (336) 763-8833 [email or web address].[email or web address] °  °[facility] [address], [place], [place] (336)723-1848, Ext. 123 Second and Fourth Thursday of each month, opens at 6:30 AM; Clinic ends at 9 AM.  Patients are seen on a first-come first-served basis, and a limited number are seen during each clinic.  ° °[facility] ° [address], [place], [place] (336) 723-7904    Eligibility Requirements °You must have lived in [place], [place], or [place] counties for at least the last three months. °  You cannot be eligible for state or federal sponsored healthcare insurance, including Veterans Administration, Medicaid, or Medicare. °  You generally cannot be eligible for healthcare insurance through your employer.  °  How to apply: °Eligibility screenings are held every Tuesday and Wednesday afternoon from 1:00 pm until 4:00 pm. You do not need an appointment for the interview!  °[facility] [address], [place], [place] 336-631-2330   °[facility]  336-342-8273   °[facility]  336-703-3100   °[facility]  336-570-6415   ° °Behavioral Health Resources in the Community: °Intensive Outpatient Programs °Organization         Address  Phone  Notes  °[facility] [address], [place], [place] 336-878-6098   °[facility] [address], [place], [place] 336-832-9800   °[facility] [address], [place], [place] ° 336-882-2125   °[facility] [address],  °[place], [place] 1-800-853-5163 or 336-641-4981   °Substance Abuse Resources °Organization         Address  Phone  Notes  °[facility]  336-882-2125   °[facility]  336-784-9470   °[facility]  336-285-9073   °[facility]  336-845-3988   °Residential & Outpatient Substance Abuse Program  1-800-659-3381   °Psychological Services °Organization         Address  Phone  Notes  ° Health  336- 832-9600   °[facility]  336- 378-7881   °[facility] [address], [place] 1-800-853-5163 or 336-641-4981   ° °Mobile Crisis Teams °Organization         Address  Phone  Notes  °[facility], Mobile Crisis Care Unit  1-877-626-1772   °Assertive °[facility] ° [address]  [place], [place] 336-834-9664   °Sharon DeEsch [address] °[place] 336-554-5454   ° °Self-Help/Support Groups °Organization         Address  Phone             Notes  °[facility] - variety of support groups  336- 373-1402 Call for more information  °Narcotics Anonymous (NA), [facility] [address], °[place]   2 meetings at this location  ° °  Residential Treatment Programs Organization         Address  Phone  Notes  [facility] [address]    [place]  1-785-774-7097   [facility]  [address] [place] [address], [place], [place]   [place], [place] [phone] Admissions: 8am-3pm M-F  [facility] [address]    [place], [place] [phone]   [facility] [address] [place], [place], [place]   [facility] [address],  [place], [place]   [facility] - Intensive Outpatient [address], [place], [place]   [facility] [address]  [place], [place] 1-2407465571 or 680-003-0759   [facility] [address] [place], [place] Accepts Medicaid  [facility] [address],  [place] 1-209-330-3413 Substance Abuse/Addiction Treatment   [facility]         Address  Phone  Notes  [facility]  548-249-6584   Domenic Schwab, PhD [address] [place], [place]   252-744-8223 or (641)695-3042   [facility] [place], [place] (850) 234-1970   [facility] [address] [place], [place] 754-470-2126 Insurance/Medicaid/sponsorship through [facility] and Families [address]                                    [place], [place] [phone] [place] [address] [place], [place] [phone]    Dr. Adele Schilder  [phone]   [facility] Dept. 1) [address], [place] 2) [place] 3)  [place] [address], [place] (719)[phone]  [phone]   [place] (203)498-2197 or (339) [phone] (After Hours)      Take your usual prescriptions as previously directed.  Apply moist heat or ice to the area(s) of discomfort, for 15 minutes at a time, several times per day for the next few days.  Do not fall asleep on a heating or ice pack.  Call your regular medical doctor tomorrow to schedule a follow up appointment within the next 2 days. Call the Neurologist tomorrow to schedule a follow up appointment within the next week.  Return to the Emergency Department immediately if worsening.

## 2014-06-04 ENCOUNTER — Encounter: Payer: Self-pay | Admitting: Internal Medicine

## 2014-07-19 ENCOUNTER — Other Ambulatory Visit: Payer: Self-pay | Admitting: Gastroenterology

## 2014-07-20 ENCOUNTER — Encounter: Payer: Self-pay | Admitting: Gastroenterology

## 2014-07-20 ENCOUNTER — Encounter (INDEPENDENT_AMBULATORY_CARE_PROVIDER_SITE_OTHER): Payer: Self-pay

## 2014-07-20 ENCOUNTER — Ambulatory Visit (INDEPENDENT_AMBULATORY_CARE_PROVIDER_SITE_OTHER): Payer: Medicaid Other | Admitting: Gastroenterology

## 2014-07-20 VITALS — BP 122/71 | HR 73 | Temp 97.7°F | Ht 64.0 in | Wt 211.2 lb

## 2014-07-20 DIAGNOSIS — R1032 Left lower quadrant pain: Secondary | ICD-10-CM

## 2014-07-20 DIAGNOSIS — K7460 Unspecified cirrhosis of liver: Secondary | ICD-10-CM

## 2014-07-20 DIAGNOSIS — K7469 Other cirrhosis of liver: Secondary | ICD-10-CM

## 2014-07-20 DIAGNOSIS — B182 Chronic viral hepatitis C: Secondary | ICD-10-CM

## 2014-07-20 LAB — COMPREHENSIVE METABOLIC PANEL
ALT: 23 U/L (ref 0–35)
AST: 39 U/L — ABNORMAL HIGH (ref 0–37)
Albumin: 3 g/dL — ABNORMAL LOW (ref 3.5–5.2)
Alkaline Phosphatase: 240 U/L — ABNORMAL HIGH (ref 39–117)
BILIRUBIN TOTAL: 1.5 mg/dL — AB (ref 0.2–1.2)
BUN: 21 mg/dL (ref 6–23)
CO2: 28 meq/L (ref 19–32)
Calcium: 9 mg/dL (ref 8.4–10.5)
Chloride: 100 mEq/L (ref 96–112)
Creat: 1.95 mg/dL — ABNORMAL HIGH (ref 0.50–1.10)
GLUCOSE: 151 mg/dL — AB (ref 70–99)
Potassium: 3.9 mEq/L (ref 3.5–5.3)
SODIUM: 139 meq/L (ref 135–145)
TOTAL PROTEIN: 6.8 g/dL (ref 6.0–8.3)

## 2014-07-20 LAB — PROTIME-INR
INR: 1.21 (ref <1.50)
PROTHROMBIN TIME: 15.3 s — AB (ref 11.6–15.2)

## 2014-07-20 NOTE — Assessment & Plan Note (Addendum)
Secondary to ETOH/HCV. Well-compensated currently. Need updated labs. Will proceed with [place] PLUS elastography due to need for hepatoma screening and Medicaid requirements of elastography prior to Hep C treatment. Remains on low dose Lasix and Aldactone. Still needs Hep A vaccination; prescription provided. Will NOT start Hep C treatment until Hep A completed, as she has been non-compliant with this in the past.

## 2014-07-20 NOTE — Patient Instructions (Signed)
Please complete the Hepatitis A vaccination. I would like for you to have this done before we officially start treatment.  Hepatitis C treatment is expensive, and your insurance will only cover one shot at it. It is important that you keep the appointments that are scheduled.  Please have blood work done today; we have also ordered an ultrasound of you liver.

## 2014-07-20 NOTE — Assessment & Plan Note (Signed)
Has hx of chronic abdominal pain. LLQ discomfort noted without any precipitating or relieving symptoms. No other associated concerning signs. With history of constipation, I have asked her to resume Amitiza today. Contact us if no improvement. Doubt acute process.

## 2014-07-20 NOTE — Assessment & Plan Note (Signed)
Elastography now in preparation for Harvoni treatment. I have discussed the importance of compliance and a "one shot" deal. I have asked her to complete the Hep A vaccination first. If she is compliant with this and remains abstinent from ETOH, will proceed with Hep treatment through our office.

## 2014-07-20 NOTE — Progress Notes (Signed)
Referring Provider: Neale Burly, MD Primary Care Physician:  Neale Burly, MD Primary GI: Dr. [name]   Chief Complaint  Patient presents with  . Follow-up    left side pain    HPI:   Gabriella Leon presents today in follow-up with history of chronic HCV, cirrhosis, ETOH abuse. Last EGD in April 2015 with grade 1-2 varices. No need for further surveillance while on prophylaxis unless evidence of bleeding. Genotype 1a, treatment naive. Needs Hep A vaccination. Needs [place] of abdomen (with elastography) now.   LLQ pain started a few days ago. Noted as constant. No fever/chills. Complains of headache. Amitiza when needed. Has BM twice a day. Amitiza once a month "if that".  Takes hydrocodone for LLQ discomfort and helps "for awhile". Quit ETOH 4 months ago. NO ETOH since. States LLQ discomfort some improved after defecation.   Past Medical History  Diagnosis Date  . Diabetes mellitus   . Hypertension   . Cirrhosis   . Hepatitis B antibody positive   . Ulcer     ?  [name] Acid reflux disease with ulcer   . Depression   . Helicobacter pylori ab+     July 2010, s/p treatment  . GERD (gastroesophageal reflux disease)   . Gallstones   . Anxiety   . Blood transfusion   . Hepatitis C     and B positive antibody  . Other pancytopenia 10/27/2013  . Folate deficiency 03/28/2014    Noted on 03/23/2014.  Folate 1 mg prescribed.  . Sleep apnea     Stop Bang score of 5  . History of alcoholism   . Arthritis   . Liver cirrhosis   . DDD (degenerative disc disease), lumbar     Past Surgical History  Procedure Laterality Date  . Cesarean section  1988    [facility]  . Cholecystectomy  greater than 10 yrs    [facility]  . Appendectomy      age 75  . Esophageal biopsy  12/18/2011       . Colonoscopy  12/18/11    minimal anal canal hemorrhoids, friable rectal and colonic mucosa, left-sided diverticulosis, repeat in 2022.   [name] Esophagogastroduodenoscopy  12/18/11    3 columns of Grade II  esophageal varices, reflux esohpagitis, portal gastropathy, path with chronic gastritis, negative H.pylori, surveillance in June 2014  . Multiple extractions with alveoloplasty  03/08/2012    Procedure: MULTIPLE EXTRACION WITH ALVEOLOPLASTY;  Surgeon: Gae Bon, DDS;  Location: [facility];  Service: Oral Surgery;  Laterality: N/A;  . Esophagogastroduodenoscopy (egd) with propofol N/A 04/13/2014    Dr. [name]: grade 1-2 varices, hiatal hernia, portal gastropathy. NO need for further surveillance while on prophylaxis unless evidence of bleeding    Current Outpatient Prescriptions  Medication Sig Dispense Refill  . acamprosate (CAMPRAL) 333 MG tablet Take 333 mg by mouth 3 (three) times daily with meals.       [name] aspirin EC 81 MG tablet Take 81 mg by mouth every morning.      . folic acid (FOLVITE) 1 MG tablet Take 1 tablet (1 mg total) by mouth daily.  30 tablet  11  . furosemide (LASIX) 40 MG tablet Take 40 mg by mouth daily.       [name] glimepiride (AMARYL) 2 MG tablet Take 2 mg by mouth daily.       [name] HYDROcodone-acetaminophen (NORCO/VICODIN) 5-325 MG per tablet Take 1 tablet by mouth every 6 (six) hours as needed for  moderate pain.      [name] lisinopril-hydrochlorothiazide (PRINZIDE,ZESTORETIC) 10-12.5 MG per tablet Take 1 tablet by mouth daily.      [name] lubiprostone (AMITIZA) 24 MCG capsule Take 24 mcg by mouth 2 (two) times daily with a meal. Only as needed      . omeprazole (PRILOSEC) 20 MG capsule Take 1 capsule (20 mg total) by mouth 2 (two) times daily before a meal.  60 capsule  11  . propranolol (INDERAL) 20 MG tablet Take 1 tablet (20 mg total) by mouth 2 (two) times daily.  60 tablet  3  . ranitidine (ZANTAC) 300 MG capsule Take 300 mg by mouth daily.      . cyclobenzaprine (FLEXERIL) 10 MG tablet Take 10 mg by mouth 3 (three) times daily as needed for muscle spasms.      . pantoprazole (PROTONIX) 40 MG tablet Take 40 mg by mouth every morning.      [name] spironolactone (ALDACTONE) 50 MG tablet Take  1 tablet (50 mg total) by mouth daily.  30 tablet  5   No current facility-administered medications for this visit.    Allergies as of 07/20/2014  . (No Known Allergies)    Family History  Problem Relation Age of Onset  . Diabetes Mother   . Cancer Paternal Uncle     passed away age 42  . Anesthesia problems Neg Hx   . Hypotension Neg Hx   . Malignant hyperthermia Neg Hx   . Pseudochol deficiency Neg Hx     History   Social History  . Marital Status: Legally Separated    Spouse Name: N/A    Number of Children: 1  . Years of Education: N/A   Occupational History  . disabled    Social History Main Topics  . Smoking status: Current Every Day Smoker -- 0.50 packs/day for 20 years    Types: Cigarettes  . Smokeless tobacco: Never Used     Comment: smokes about 6 cigarettes  . Alcohol Use: No     Comment: Hx 1/2 pint gin daily, patient states "has  cut back alot" . Last etoh 02/2014  . Drug Use: No  . Sexual Activity: Yes    Birth Control/ Protection: Post-menopausal   Other Topics Concern  . None   Social History Narrative  . None    Review of Systems: As mentioned in HPI.   Physical Exam: BP 122/71  Pulse 73  Temp(Src) 97.7 F (36.5 C) (Oral)  Ht 5\' 4"  (1.626 m)  Wt 211 lb 3.2 oz (95.8 kg)  BMI 36.23 kg/m2 General:   Alert and oriented. No distress noted. Pleasant and cooperative.  Head:  Normocephalic and atraumatic. Eyes:  Conjuctiva clear without scleral icterus. Mouth:  Oral mucosa pink and moist. Good dentition. No lesions. Heart:  S1, S2 present without murmurs, rubs, or gallops. Regular rate and rhythm. Abdomen:  +BS, soft, mild discomfort LLQ without mass, rebound, guarding, or peritoneal signs. non-distended.  Msk:  Symmetrical without gross deformities. Normal posture. Extremities:  Without edema. Neurologic:  Alert and  oriented x4;  grossly normal neurologically. Skin:  Intact without significant lesions or rashes. Psych:  Alert and  cooperative. Normal mood and affect.

## 2014-07-21 LAB — CBC
HEMATOCRIT: 29.6 % — AB (ref 36.0–46.0)
HEMOGLOBIN: 10.4 g/dL — AB (ref 12.0–15.0)
MCH: 32.8 pg (ref 26.0–34.0)
MCHC: 35.1 g/dL (ref 30.0–36.0)
MCV: 93.4 fL (ref 78.0–100.0)
Platelets: 51 10*3/uL — ABNORMAL LOW (ref 150–400)
RBC: 3.17 MIL/uL — AB (ref 3.87–5.11)
RDW: 15 % (ref 11.5–15.5)
WBC: 3.3 10*3/uL — ABNORMAL LOW (ref 4.0–10.5)

## 2014-07-21 LAB — HCV RNA QUANT RFLX ULTRA OR GENOTYP
HCV Quantitative Log: 5.13 {Log} — ABNORMAL HIGH (ref <1.18)
HCV Quantitative: 135711 IU/mL — ABNORMAL HIGH (ref <15)

## 2014-07-25 ENCOUNTER — Telehealth: Payer: Self-pay

## 2014-07-25 ENCOUNTER — Ambulatory Visit (HOSPITAL_COMMUNITY)
Admission: RE | Admit: 2014-07-25 | Discharge: 2014-07-25 | Disposition: A | Discharge status: Home / Self Care | Payer: Medicaid Other | Source: Ambulatory Visit | Attending: Gastroenterology | Admitting: Gastroenterology

## 2014-07-25 DIAGNOSIS — B182 Chronic viral hepatitis C: Secondary | ICD-10-CM

## 2014-07-25 DIAGNOSIS — R161 Splenomegaly, not elsewhere classified: Secondary | ICD-10-CM | POA: Insufficient documentation

## 2014-07-25 DIAGNOSIS — K7460 Unspecified cirrhosis of liver: Secondary | ICD-10-CM | POA: Insufficient documentation

## 2014-07-25 NOTE — Telephone Encounter (Signed)
Pt is calling wanting her blood work results. Please advise

## 2014-07-25 NOTE — Progress Notes (Signed)
Quick Note:  MELD 16 Pancytopenia, chronic. Hgb overall stable.  Cr worse than baseline, looks like it has been progressing since Dec. Now 1.95.  Have her hold Lasix and Aldactone for 3 days, then let's recheck. May need to adjust that dosage.  Needs to have Hep A vaccination completed. Once this is done, will consider Harvoni treatment.    ______

## 2014-07-25 NOTE — Telephone Encounter (Signed)
Please see result notes.  

## 2014-07-26 ENCOUNTER — Other Ambulatory Visit: Payer: Self-pay

## 2014-07-26 ENCOUNTER — Other Ambulatory Visit: Payer: Self-pay | Admitting: Gastroenterology

## 2014-07-26 DIAGNOSIS — R7989 Other specified abnormal findings of blood chemistry: Secondary | ICD-10-CM

## 2014-07-26 LAB — HEPATITIS C GENOTYPE

## 2014-07-26 NOTE — Progress Notes (Signed)
Quick Note:  Genotype 1a. See result note. ______

## 2014-07-29 LAB — BASIC METABOLIC PANEL
BUN: 15 mg/dL (ref 6–23)
CALCIUM: 8.9 mg/dL (ref 8.4–10.5)
CO2: 20 meq/L (ref 19–32)
CREATININE: 1.92 mg/dL — AB (ref 0.50–1.10)
Chloride: 104 mEq/L (ref 96–112)
GLUCOSE: 124 mg/dL — AB (ref 70–99)
Potassium: 4.5 mEq/L (ref 3.5–5.3)
Sodium: 136 mEq/L (ref 135–145)

## 2014-07-31 ENCOUNTER — Telehealth: Payer: Self-pay | Admitting: *Deleted

## 2014-07-31 NOTE — Telephone Encounter (Signed)
Pt called wanting to know if her results are back from her CT. Please advise [phone]

## 2014-07-31 NOTE — Telephone Encounter (Signed)
Pt is aware.  Gabriella Leon, please nic.

## 2014-07-31 NOTE — Telephone Encounter (Signed)
Pt had an ultrasound done. Routing to LSL in AS absence

## 2014-07-31 NOTE — Telephone Encounter (Signed)
She will need another abd u/s in 6 months for hepatoma surveillance.

## 2014-07-31 NOTE — Progress Notes (Signed)
Quick Note:  Please have patient hold lasix and aldactone.  Repeat Met-7 in one week.  Call if notices swelling in legs or increase in weight of more than 3-4 pounds.   ______

## 2014-07-31 NOTE — Telephone Encounter (Signed)
I'm not sure why, but u/s report was not routed to AS or RMR.  No hepatoma seen. Known cirrhosis.  Please advise patient to get Hep A vaccine as requested by AS.   See BMP result note too.

## 2014-08-01 ENCOUNTER — Other Ambulatory Visit: Payer: Self-pay | Admitting: Gastroenterology

## 2014-08-01 ENCOUNTER — Other Ambulatory Visit: Payer: Self-pay

## 2014-08-01 DIAGNOSIS — R7989 Other specified abnormal findings of blood chemistry: Secondary | ICD-10-CM

## 2014-08-01 NOTE — Telephone Encounter (Signed)
Reminder in epic °

## 2014-08-09 ENCOUNTER — Telehealth: Payer: Self-pay

## 2014-08-09 NOTE — Telephone Encounter (Signed)
Ginger, we dont have any results from [facility]. Will you call them and see if they will fax them to [place] please.  Routing to AS- LSL was taking care of this when AS was on vacation.

## 2014-08-09 NOTE — Telephone Encounter (Signed)
Pt is calling to see what the results are from her blood work done on Saturday. She had them done at [facility]. Please advise

## 2014-08-09 NOTE — Telephone Encounter (Signed)
They are going to fax [place] the results

## 2014-08-10 NOTE — Telephone Encounter (Signed)
Please make sure labs are available for Gabriella Leon to review tomorrow morning. Thanks.

## 2014-08-11 NOTE — Telephone Encounter (Signed)
I did not receive the labs so I called [facility] again this morning to have the fax them again. Waiting to get them. Then I will put in AS chair

## 2014-08-11 NOTE — Telephone Encounter (Signed)
Labs are in AS chair

## 2014-08-11 NOTE — Telephone Encounter (Signed)
Ginger, please make sure we put the results in Anna's chair for review.

## 2014-08-14 ENCOUNTER — Telehealth: Payer: Self-pay | Admitting: *Deleted

## 2014-08-14 NOTE — Telephone Encounter (Signed)
Pt is calling today for lab results

## 2014-08-14 NOTE — Telephone Encounter (Signed)
Pt called wanting results 

## 2014-08-15 ENCOUNTER — Other Ambulatory Visit: Payer: Self-pay | Admitting: Gastroenterology

## 2014-08-15 ENCOUNTER — Telehealth: Payer: Self-pay | Admitting: *Deleted

## 2014-08-15 DIAGNOSIS — R944 Abnormal results of kidney function studies: Secondary | ICD-10-CM

## 2014-08-15 NOTE — Telephone Encounter (Signed)
Pt is aware of results. She does not have a Nephrologist.  Gabriella Leon Can you please refer her to a Nephrologist.

## 2014-08-15 NOTE — Telephone Encounter (Signed)
Pt called wanting to get her lab results. Please advise [phone]

## 2014-08-15 NOTE — Telephone Encounter (Signed)
Referral has been made to Dr. Lowanda Foster

## 2014-08-15 NOTE — Telephone Encounter (Signed)
See addendum note.

## 2014-08-15 NOTE — Telephone Encounter (Signed)
Creatinine is slightly improved to 1.68. Was 1.92. Looks like her renal function has been worsening and she actually has chronic kidney disease. GFR 38. I would continue to hold lasix and aldactone. Needs to strictly follow a low sodium diet, no more than 2 grams daily.   If she does not already have a nephrologist, we need to get her in to be seen by one.

## 2014-08-31 ENCOUNTER — Encounter: Payer: Self-pay | Admitting: Internal Medicine

## 2014-09-04 ENCOUNTER — Encounter: Payer: Self-pay | Admitting: Internal Medicine

## 2014-09-18 ENCOUNTER — Encounter (HOSPITAL_COMMUNITY): Payer: Self-pay | Admitting: Oncology

## 2014-09-18 ENCOUNTER — Telehealth: Payer: Self-pay | Admitting: Internal Medicine

## 2014-09-18 DIAGNOSIS — I8500 Esophageal varices without bleeding: Secondary | ICD-10-CM

## 2014-09-18 HISTORY — DX: Esophageal varices without bleeding: I85.00

## 2014-09-18 NOTE — Progress Notes (Signed)
-  No show-  Gabriella Leon  

## 2014-09-18 NOTE — Telephone Encounter (Signed)
Pt is aware of OV on 10/28 at 1030 with AS and said that she was referred to Dr Lowanda Foster and would be having blood work done tomorrow. Will we get the results of these labs. Please advise [phone]

## 2014-09-22 ENCOUNTER — Ambulatory Visit (HOSPITAL_COMMUNITY): Payer: Medicaid Other | Admitting: Oncology

## 2014-10-18 ENCOUNTER — Ambulatory Visit: Payer: Medicaid Other | Admitting: Gastroenterology

## 2014-11-21 ENCOUNTER — Ambulatory Visit (INDEPENDENT_AMBULATORY_CARE_PROVIDER_SITE_OTHER): Payer: Medicaid Other | Admitting: Gastroenterology

## 2014-11-21 ENCOUNTER — Encounter: Payer: Self-pay | Admitting: Gastroenterology

## 2014-11-21 VITALS — BP 145/82 | HR 86 | Temp 98.2°F | Ht 63.0 in | Wt 193.8 lb

## 2014-11-21 DIAGNOSIS — B182 Chronic viral hepatitis C: Secondary | ICD-10-CM

## 2014-11-21 NOTE — Patient Instructions (Signed)
Please complete blood work.  Complete the Hepatitis A vaccination as soon as possible and call us when that is finished.

## 2014-11-21 NOTE — Progress Notes (Signed)
Referring Provider: Neale Burly, MD Primary Care Physician:  Neale Burly, MD  Primary GI: Dr. [name]   Chief Complaint  Patient presents with  . Follow-up    HPI:   WILLENA JEANCHARLES presents today in follow-up with history of chronic HCV, cirrhosis, ETOH abuse. Last EGD in April 2015 with grade 1-2 varices. No need for further surveillance while on prophylaxis unless evidence of bleeding. Genotype 1a, treatment naive. Needs Hep A vaccination. [place] of abdomen with Metavir fibrosis score of F4.   Has rash  On arms, lower back. Just changed detergents. Feels tingling in shoulder radiating down to hand. Just had shot in knee from arthritis. No alcohol for 7-8 months. Prilosec BID currently.     Past Medical History  Diagnosis Date  . Diabetes mellitus   . Hypertension   . Cirrhosis   . Hepatitis B antibody positive   . Ulcer     ?  [name] Acid reflux disease with ulcer   . Depression   . Helicobacter pylori ab+     July 2010, s/p treatment  . GERD (gastroesophageal reflux disease)   . Gallstones   . Anxiety   . Blood transfusion   . Hepatitis C     and B positive antibody  . Other pancytopenia 10/27/2013  . Folate deficiency 03/28/2014    Noted on 03/23/2014.  Folate 1 mg prescribed.  . Sleep apnea     Stop Bang score of 5  . History of alcoholism   . Arthritis   . Liver cirrhosis   . DDD (degenerative disc disease), lumbar   . Esophageal varices 09/18/2014    Past Surgical History  Procedure Laterality Date  . Cesarean section  1988    [facility]  . Cholecystectomy  greater than 10 yrs    [facility]  . Appendectomy      age 58  . Esophageal biopsy  12/18/2011       . Colonoscopy  12/18/11    minimal anal canal hemorrhoids, friable rectal and colonic mucosa, left-sided diverticulosis, repeat in 2022.   [name] Esophagogastroduodenoscopy  12/18/11    3 columns of Grade II esophageal varices, reflux esohpagitis, portal gastropathy, path with chronic gastritis, negative H.pylori,  surveillance in June 2014  . Multiple extractions with alveoloplasty  03/08/2012    Procedure: MULTIPLE EXTRACION WITH ALVEOLOPLASTY;  Surgeon: Gae Bon, DDS;  Location: [facility];  Service: Oral Surgery;  Laterality: N/A;  . Esophagogastroduodenoscopy (egd) with propofol N/A 04/13/2014    Dr. [name]: grade 1-2 varices, hiatal hernia, portal gastropathy. NO need for further surveillance while on prophylaxis unless evidence of bleeding    Current Outpatient Prescriptions  Medication Sig Dispense Refill  . acamprosate (CAMPRAL) 333 MG tablet Take 333 mg by mouth 3 (three) times daily with meals.     [name] aspirin EC 81 MG tablet Take 81 mg by mouth every morning.    . folic acid (FOLVITE) 1 MG tablet Take 1 tablet (1 mg total) by mouth daily. 30 tablet 11  . furosemide (LASIX) 40 MG tablet Take 40 mg by mouth daily.     [name] glimepiride (AMARYL) 2 MG tablet Take 2 mg by mouth daily.     [name] HYDROcodone-acetaminophen (NORCO/VICODIN) 5-325 MG per tablet Take 1 tablet by mouth every 6 (six) hours as needed for moderate pain.    [name] lisinopril-hydrochlorothiazide (PRINZIDE,ZESTORETIC) 10-12.5 MG per tablet Take 1 tablet by mouth daily.    [name] lubiprostone (AMITIZA) 24 MCG capsule  Take 24 mcg by mouth 2 (two) times daily with a meal. Only as needed    . omeprazole (PRILOSEC) 20 MG capsule Take 1 capsule (20 mg total) by mouth 2 (two) times daily before a meal. 60 capsule 11  . propranolol (INDERAL) 20 MG tablet Take 1 tablet (20 mg total) by mouth 2 (two) times daily. 60 tablet 3  . ranitidine (ZANTAC) 300 MG capsule Take 300 mg by mouth daily.    [name] spironolactone (ALDACTONE) 50 MG tablet Take 50 mg by mouth daily.     No current facility-administered medications for this visit.    Allergies as of 11/21/2014  . (No Known Allergies)    Family History  Problem Relation Age of Onset  . Diabetes Mother   . Cancer Paternal Uncle     passed away age 56  . Anesthesia problems Neg Hx   . Hypotension Neg Hx     . Malignant hyperthermia Neg Hx   . Pseudochol deficiency Neg Hx     History   Social History  . Marital Status: Legally Separated    Spouse Name: N/A    Number of Children: 1  . Years of Education: N/A   Occupational History  . disabled    Social History Main Topics  . Smoking status: Current Every Day Smoker -- 0.50 packs/day for 20 years    Types: Cigarettes  . Smokeless tobacco: Never Used     Comment: smokes about 6 cigarettes  . Alcohol Use: No     Comment: Hx 1/2 pint gin daily, patient states "has  cut back alot" . Last etoh 02/2014  . Drug Use: No  . Sexual Activity: Yes    Birth Control/ Protection: Post-menopausal   Other Topics Concern  . None   Social History Narrative    Review of Systems: Negative unless mentioned in HPI.   Physical Exam: BP 145/82 mmHg  Pulse 86  Temp(Src) 98.2 F (36.8 C) (Oral)  Ht 5\' 3"  (1.6 m)  Wt 193 lb 12.8 oz (87.907 kg)  BMI 34.34 kg/m2 General:   Alert and oriented. No distress noted. Pleasant and cooperative.  Head:  Normocephalic and atraumatic. Eyes:  Conjuctiva clear without scleral icterus. Abdomen:  +BS, soft, non-tender and non-distended. No rebound or guarding. No HSM or masses noted. Msk:  Symmetrical without gross deformities. Normal posture. Extremities:  Without edema. Neurologic:  Alert and  oriented x4;  grossly normal neurologically. Skin:  Intact without significant lesions or rashes. Psych:  Alert and cooperative. Normal mood and affect.

## 2014-11-24 ENCOUNTER — Telehealth: Payer: Self-pay | Admitting: Gastroenterology

## 2014-11-24 NOTE — Telephone Encounter (Signed)
Needs Hep A vaccination. Can we see if she has done this?

## 2014-11-24 NOTE — Assessment & Plan Note (Signed)
Genotype 1a, treatment naive, well-compensated. Still needs Hep A vaccination. Metavir score 4. Needs Harvoni X 12 weeks; I have asked her to complete the Hep A vaccination prior to starting Harvoni treatment, as there has been some concern for non-compliance in the past. She states understanding.

## 2014-11-29 ENCOUNTER — Telehealth: Payer: Self-pay | Admitting: Internal Medicine

## 2014-11-29 MED ORDER — LEDIPASVIR-SOFOSBUVIR 90-400 MG PO TABS: 1.0000 | ORAL_TABLET | Freq: Every day | ORAL | Status: DC

## 2014-11-29 NOTE — Telephone Encounter (Signed)
See other phone note

## 2014-11-29 NOTE — Telephone Encounter (Signed)
Excellent! I have sent Harvoni X 12 weeks to pharmacy. Please have this mailed to our office.   She will need to take Prilosec AT [place] each morning. No more than the 20 mg Prilosec daily. No H2 blockers or OTC agents such as multivitamins, etc.   Return in 4 weeks after starting.

## 2014-11-29 NOTE — Telephone Encounter (Signed)
Patient took hep-a vaccine shot 11/28/14 and had lab work 11/29/14.  She was to let office know so prescription could be called in     Patient number [phone]

## 2014-11-29 NOTE — Progress Notes (Signed)
cc'ed to pcp °

## 2014-11-30 NOTE — Telephone Encounter (Signed)
The [employer] paperwork is on your desk.

## 2015-01-23 ENCOUNTER — Telehealth: Payer: Self-pay | Admitting: Internal Medicine

## 2015-01-23 NOTE — Telephone Encounter (Signed)
February RECALL FOR ULTRASOUND °

## 2015-01-24 NOTE — Telephone Encounter (Signed)
Insurance is pending for her PA to have test done

## 2015-01-25 NOTE — Telephone Encounter (Signed)
PA# [id number]. Letter mailed with appointment date and time for [place]

## 2015-02-01 ENCOUNTER — Ambulatory Visit (HOSPITAL_COMMUNITY)
Admission: RE | Admit: 2015-02-01 | Discharge: 2015-02-01 | Disposition: A | Discharge status: Home / Self Care | Payer: Medicaid Other | Source: Ambulatory Visit | Attending: Gastroenterology | Admitting: Gastroenterology

## 2015-02-01 DIAGNOSIS — B182 Chronic viral hepatitis C: Secondary | ICD-10-CM

## 2015-02-01 DIAGNOSIS — K7460 Unspecified cirrhosis of liver: Secondary | ICD-10-CM | POA: Insufficient documentation

## 2015-02-06 NOTE — Telephone Encounter (Signed)
It was denied by medicaid.

## 2015-02-06 NOTE — Progress Notes (Signed)
Quick Note:  Please let patient know her u/s showed no evidence of hepatoma. NIC for abd [place] in six months.  PLEASE FIND OUT STATUS OF HER HARVONI. NO INDICATION THAT SHE EVER GOT STARTED BY ANNA. ______

## 2015-02-06 NOTE — Progress Notes (Signed)
Quick Note:  Do we know why she was denied? I may need to do an appeal. ______

## 2015-02-06 NOTE — Telephone Encounter (Signed)
Do we know the status of this?

## 2015-02-08 NOTE — Progress Notes (Signed)
ON RECALL LIST  °

## 2015-02-26 ENCOUNTER — Other Ambulatory Visit: Payer: Self-pay | Admitting: Gastroenterology

## 2015-02-26 ENCOUNTER — Encounter: Payer: Self-pay | Admitting: Gastroenterology

## 2015-02-26 NOTE — Progress Notes (Signed)
Quick Note:  I have done an appeal letter. Viekira interacts with amaryl and propanolol. Hopefully, Medicaid will approve Harvoni. ______

## 2015-02-26 NOTE — Telephone Encounter (Signed)
Tried to call [employer] to see if I needed to send the letter for an appeal to them or if we needed to send it. Left a message for Sherren Mocha to call me back.

## 2015-02-26 NOTE — Telephone Encounter (Signed)
Letter completed.

## 2015-02-27 NOTE — Telephone Encounter (Signed)
Received a package today with the pts harvoni in it. I called Todd at [employer], he said that [facility] medicaid did not receive all the information that he had sent to them. He said she was approved now.  [name], do you still want the pt to have the same instructions as before? (see the bottom of this note)

## 2015-02-27 NOTE — Telephone Encounter (Signed)
Instructions printed and will go over them when pt picks up Harvoni

## 2015-02-27 NOTE — Telephone Encounter (Signed)
Yes, see below:     She will need to take Prilosec AT [place] each morning. No more than the 20 mg Prilosec daily. No H2 blockers or OTC agents such as multivitamins, etc.   Return in 4 weeks after starting.

## 2015-02-27 NOTE — Telephone Encounter (Signed)
Tried to call pt- LMOM 

## 2015-02-27 NOTE — Telephone Encounter (Signed)
Please schedule ov in 4 weeks.

## 2015-02-28 ENCOUNTER — Encounter: Payer: Self-pay | Admitting: Internal Medicine

## 2015-02-28 NOTE — Telephone Encounter (Signed)
APPOINTMENT MADE AND LETTER SENT °

## 2015-03-02 NOTE — Telephone Encounter (Signed)
Pt is aware and will pick up meds next week.

## 2015-03-27 ENCOUNTER — Telehealth: Payer: Self-pay

## 2015-03-27 NOTE — Telephone Encounter (Signed)
pts harvoni shipment has arrived. I have spoken with the pt and informed her that it was here. She said she would pick it up. Reminded her that we close at 12 on Fridays.

## 2015-04-03 ENCOUNTER — Encounter (INDEPENDENT_AMBULATORY_CARE_PROVIDER_SITE_OTHER): Payer: Self-pay

## 2015-04-03 ENCOUNTER — Ambulatory Visit (INDEPENDENT_AMBULATORY_CARE_PROVIDER_SITE_OTHER): Payer: Medicaid Other | Admitting: Gastroenterology

## 2015-04-03 ENCOUNTER — Encounter: Payer: Self-pay | Admitting: Gastroenterology

## 2015-04-03 VITALS — BP 137/81 | HR 81 | Temp 97.4°F | Ht 64.0 in | Wt 193.2 lb

## 2015-04-03 DIAGNOSIS — B182 Chronic viral hepatitis C: Secondary | ICD-10-CM | POA: Diagnosis not present

## 2015-04-03 DIAGNOSIS — K7460 Unspecified cirrhosis of liver: Principal | ICD-10-CM

## 2015-04-03 NOTE — Progress Notes (Signed)
CC'ED TO PCP 

## 2015-04-03 NOTE — Patient Instructions (Signed)
Please complete blood work.  Continue to take your Harvoni with Prilosec each morning. Avoid Zantac if possible. If you have to take it, make sure it is at least 12 hours after taking Harvoni.  We will see you back in 2 months. Your next blood work after today will be 3 months after you complete your Harvoni.

## 2015-04-03 NOTE — Progress Notes (Signed)
Referring Provider: Neale Burly, MD Primary Care Physician:  Neale Burly, MD  Primary GI: Dr. [name]   Chief Complaint  Patient presents with  . Follow-up    HPI:   Gabriella Leon is a 59 y.o. female presenting today with a history of chronic HCV cirrhosis, history of ETOH abuse in the past but without any ETOH since March 2015, last EGD in April 2015 with grade 1-2 varices, on prophylaxis. Genotype 1a, treatment naive. US abdomen with fibrosis score F4. Hepatoma screening up to date, with next due in Aug 2016. Vaccinations completed as per last request. Completed 30 days of Harvoni. 2 months left to go. Some fatigue. No headaches. No abdominal pain. Cutting out sweets, purposefully trying to lose weight.   Amitiza 24 mcg BID for constipation. Prilosec taken with Harvoni each morning. Propanolol BID.     Past Medical History  Diagnosis Date  . Diabetes mellitus   . Hypertension   . Cirrhosis   . Hepatitis B antibody positive   . Ulcer     ?  [name] Acid reflux disease with ulcer   . Depression   . Helicobacter pylori ab+     July 2010, s/p treatment  . GERD (gastroesophageal reflux disease)   . Gallstones   . Anxiety   . Blood transfusion   . Hepatitis C     and B positive antibody  . Other pancytopenia 10/27/2013  . Folate deficiency 03/28/2014    Noted on 03/23/2014.  Folate 1 mg prescribed.  . Sleep apnea     Stop Bang score of 5  . History of alcoholism   . Arthritis   . Liver cirrhosis   . DDD (degenerative disc disease), lumbar   . Esophageal varices 09/18/2014    Past Surgical History  Procedure Laterality Date  . Cesarean section  1988    [facility]  . Cholecystectomy  greater than 10 yrs    [facility]  . Appendectomy      age 70  . Esophageal biopsy  12/18/2011       . Colonoscopy  12/18/11    minimal anal canal hemorrhoids, friable rectal and colonic mucosa, left-sided diverticulosis, repeat in 2022.   [name] Esophagogastroduodenoscopy  12/18/11    3  columns of Grade II esophageal varices, reflux esohpagitis, portal gastropathy, path with chronic gastritis, negative H.pylori, surveillance in June 2014  . Multiple extractions with alveoloplasty  03/08/2012    Procedure: MULTIPLE EXTRACION WITH ALVEOLOPLASTY;  Surgeon: Gae Bon, DDS;  Location: [facility];  Service: Oral Surgery;  Laterality: N/A;  . Esophagogastroduodenoscopy (egd) with propofol N/A 04/13/2014    Dr. [name]: grade 1-2 varices, hiatal hernia, portal gastropathy. NO need for further surveillance while on prophylaxis unless evidence of bleeding    Current Outpatient Prescriptions  Medication Sig Dispense Refill  . acamprosate (CAMPRAL) 333 MG tablet Take 333 mg by mouth 3 (three) times daily with meals.     [name] aspirin EC 81 MG tablet Take 81 mg by mouth every morning.    . folic acid (FOLVITE) 1 MG tablet Take 1 tablet (1 mg total) by mouth daily. 30 tablet 11  . furosemide (LASIX) 40 MG tablet Take 40 mg by mouth daily.     [name] glimepiride (AMARYL) 2 MG tablet Take 2 mg by mouth daily.     [name] HYDROcodone-acetaminophen (NORCO/VICODIN) 5-325 MG per tablet Take 1 tablet by mouth every 6 (six) hours as needed for moderate pain.    [name]  Ledipasvir-Sofosbuvir (HARVONI) 90-400 MG TABS Take 1 capsule by mouth daily. 30 tablet 2  . lisinopril-hydrochlorothiazide (PRINZIDE,ZESTORETIC) 10-12.5 MG per tablet Take 1 tablet by mouth daily.    [name] lubiprostone (AMITIZA) 24 MCG capsule Take 24 mcg by mouth 2 (two) times daily with a meal. Only as needed    . omeprazole (PRILOSEC) 20 MG capsule Take 1 capsule (20 mg total) by mouth 2 (two) times daily before a meal. 60 capsule 11  . propranolol (INDERAL) 20 MG tablet Take 1 tablet (20 mg total) by mouth 2 (two) times daily. 60 tablet 3  . ranitidine (ZANTAC) 300 MG capsule Take 300 mg by mouth daily.    [name] spironolactone (ALDACTONE) 50 MG tablet Take 50 mg by mouth daily.     No current facility-administered medications for this visit.    Allergies as  of 04/03/2015  . (No Known Allergies)    Family History  Problem Relation Age of Onset  . Diabetes Mother   . Cancer Paternal Uncle     passed away age 30  . Anesthesia problems Neg Hx   . Hypotension Neg Hx   . Malignant hyperthermia Neg Hx   . Pseudochol deficiency Neg Hx     History   Social History  . Marital Status: Legally Separated    Spouse Name: N/A  . Number of Children: 1  . Years of Education: N/A   Occupational History  . disabled    Social History Main Topics  . Smoking status: Current Every Day Smoker -- 0.50 packs/day for 20 years    Types: Cigarettes  . Smokeless tobacco: Never Used     Comment: smokes about 6 cigarettes  . Alcohol Use: No     Comment: Hx 1/2 pint gin daily, patient states "has  cut back alot" . Last etoh 02/2014  . Drug Use: No  . Sexual Activity: Yes    Birth Control/ Protection: Post-menopausal   Other Topics Concern  . None   Social History Narrative    Review of Systems: As mentioned in HPI  Physical Exam: BP 137/81 mmHg  Pulse 81  Temp(Src) 97.4 F (36.3 C)  Ht 5\' 4"  (1.626 m)  Wt 193 lb 3.2 oz (87.635 kg)  BMI 33.15 kg/m2 General:   Alert and oriented. No distress noted. Pleasant and cooperative.  Head:  Normocephalic and atraumatic. Eyes:  Conjuctiva clear without scleral icterus. Mouth:  Oral mucosa pink and moist. Good dentition. No lesions. Abdomen:  +BS, soft, non-tender and non-distended. No rebound or guarding. No HSM or masses noted. Msk:  Symmetrical without gross deformities. Normal posture. Extremities:  Without edema. Neurologic:  Alert and  oriented x4;  grossly normal neurologically. Skin:  Intact without significant lesions or rashes. Psych:  Alert and cooperative. Normal mood and affect.

## 2015-04-03 NOTE — Assessment & Plan Note (Signed)
4 weeks s/p Harvoni treatment, 2 months left to complete. Doing quite well, feeling improved overall since starting treatment. No concerns or adverse side effects. Continued to stress importance of taking Prilosec WITH Harvoni each morning and avoidance of H2 blockers unless absolutely necessary (then if needed, take at least 12 hours apart from [place]). Will check standard labs to include RNA today; repeat labs 3 months after treatment is completed, which will be around Sept 2016. Return in 2 months. Would recommend elastography for next ultrasound.

## 2015-04-04 LAB — PROTIME-INR

## 2015-06-04 ENCOUNTER — Ambulatory Visit: Payer: Medicaid Other | Admitting: Gastroenterology

## 2015-06-08 ENCOUNTER — Ambulatory Visit: Payer: Medicaid Other | Admitting: Gastroenterology

## 2015-06-27 ENCOUNTER — Telehealth: Payer: Self-pay | Admitting: Internal Medicine

## 2015-06-27 NOTE — Telephone Encounter (Signed)
Letter mailed for her to call us 

## 2015-06-27 NOTE — Telephone Encounter (Signed)
Pt has OV with [facility] on 7/14 at 330 and also she is on the August recall list for a 6 months U/S

## 2015-07-05 ENCOUNTER — Ambulatory Visit: Payer: Medicaid Other | Admitting: Gastroenterology

## 2015-07-05 NOTE — Telephone Encounter (Signed)
What reason for U/S did we use?  Is it being denied because it hasn't been six month? She is not due for U/S until 08/02/15 or after.

## 2015-07-05 NOTE — Telephone Encounter (Signed)
Probably should also include rule out hepatoma. I need case ID number please.

## 2015-07-05 NOTE — Telephone Encounter (Signed)
Must use code [phone], limited RUQ u/s not (Abd u/s).  Approval code: [id number].   Please change U/S to ruq U/S. Thanks!

## 2015-07-05 NOTE — Telephone Encounter (Signed)
Insurance has denied Gabriella Leon. Peer-to -peer review is needed. The number is [phone] Option 4 then Option 2

## 2015-07-05 NOTE — Telephone Encounter (Signed)
Patient called and rescheduled her ov to 08/02/15, however she would like to have her abd u/s done prior to her appt.  She was on the August recall list.  Routing to the clinical pool for scheduling.

## 2015-07-05 NOTE — Telephone Encounter (Signed)
Case # is [id number]

## 2015-07-05 NOTE — Telephone Encounter (Signed)
Insurance is pending

## 2015-07-05 NOTE — Telephone Encounter (Signed)
Hepatic cirrhosis was used for diagnosis. Insurance states that provided clinical documentation doesn't support need for imaging

## 2015-07-06 ENCOUNTER — Other Ambulatory Visit: Payer: Self-pay

## 2015-07-06 DIAGNOSIS — K7460 Unspecified cirrhosis of liver: Secondary | ICD-10-CM

## 2015-07-06 NOTE — Telephone Encounter (Signed)
Noted. Called pt and LMOM with her [place] appt.  [place] appt is on 07/25/2015 @ 8:00am. LMOM for her to be fasting and to call office back if she has any questions

## 2015-07-25 ENCOUNTER — Other Ambulatory Visit: Payer: Self-pay | Admitting: Gastroenterology

## 2015-07-25 ENCOUNTER — Ambulatory Visit (HOSPITAL_COMMUNITY): Admission: RE | Admit: 2015-07-25 | Payer: Medicaid Other | Source: Ambulatory Visit

## 2015-07-31 ENCOUNTER — Telehealth: Payer: Self-pay | Admitting: Gastroenterology

## 2015-07-31 NOTE — Telephone Encounter (Signed)
Outside labs from April 2016 reviewed. These were done at outside facility. THESE WERE ONE MONTH LABS INTO HARVONI.   Hep C RNA quant: 80 HCV log: 1.903 Alk Phos 123, AST 42, ALT 26, Tbili 1.2, Sodium 133, potassium 3.8. Creatinine 1.61. Plts 55, Hgb 11.8.    She needs Hep C RNA at upcoming appt, along with CMP, INR, CBC.   

## 2015-08-01 ENCOUNTER — Ambulatory Visit (HOSPITAL_COMMUNITY)
Admission: RE | Admit: 2015-08-01 | Discharge: 2015-08-01 | Disposition: A | Discharge status: Home / Self Care | Payer: Medicaid Other | Source: Ambulatory Visit | Attending: Gastroenterology | Admitting: Gastroenterology

## 2015-08-01 DIAGNOSIS — B1920 Unspecified viral hepatitis C without hepatic coma: Secondary | ICD-10-CM | POA: Insufficient documentation

## 2015-08-01 DIAGNOSIS — K7460 Unspecified cirrhosis of liver: Secondary | ICD-10-CM

## 2015-08-01 NOTE — Progress Notes (Signed)
ON RECALL  °

## 2015-08-01 NOTE — Progress Notes (Signed)
Quick Note:  No hepatoma. Patient has appointment tomorrow. NIC for RUQ u/s in six months (dx: cirrhosis). ______

## 2015-08-02 ENCOUNTER — Encounter: Payer: Self-pay | Admitting: Gastroenterology

## 2015-08-02 ENCOUNTER — Ambulatory Visit (INDEPENDENT_AMBULATORY_CARE_PROVIDER_SITE_OTHER): Payer: Medicaid Other | Admitting: Gastroenterology

## 2015-08-02 VITALS — BP 130/71 | HR 89 | Temp 98.3°F | Ht 64.0 in | Wt 191.2 lb

## 2015-08-02 DIAGNOSIS — R7989 Other specified abnormal findings of blood chemistry: Secondary | ICD-10-CM | POA: Diagnosis not present

## 2015-08-02 DIAGNOSIS — B182 Chronic viral hepatitis C: Secondary | ICD-10-CM | POA: Diagnosis not present

## 2015-08-02 DIAGNOSIS — K7460 Unspecified cirrhosis of liver: Principal | ICD-10-CM

## 2015-08-02 DIAGNOSIS — R945 Abnormal results of liver function studies: Secondary | ICD-10-CM

## 2015-08-02 NOTE — Assessment & Plan Note (Signed)
Approximately 2 months status post Harvoni treatment. Without any complaints. She is due for labs today.  Noted elevated iron, iron saturations, ferritin with recent labs. Doubt we are dealing with hemochromatosis. Concern for recurrent alcohol use. We'll touch base with patient and make sure she's not on iron supplements, consuming alcohol, receiving recent blood transfusions. If not we will pursue HFE genetics.  Encouraged no alcohol whatsoever.  Office visit in 6 months for cirrhosis surveillance, i.e. labs and ultrasound.

## 2015-08-02 NOTE — Patient Instructions (Signed)
1. Please continue to avoid all alcohol use. 2. I will review your recent labs and we will let you know further recommendations.  3. Return to the office in six months. We will plan on repeating your ultrasound and labs at that time.

## 2015-08-02 NOTE — Progress Notes (Signed)
Primary Care Physician: Neale Burly, MD  Primary Gastroenterologist:  Garfield Cornea, MD   Chief Complaint  Patient presents with  . Follow-up    doing well    HPI: Gabriella Leon is a 59 y.o. female here for follow-up. History of chronic hepatitis C cirrhosis, history of alcohol abuse in the past, last EGD in April 2015 with grade 1-2 varices, on prophylaxis. Genotype 1A, treatment nave. Ultrasound abdomen with fibrosis score F4. Completed Harvoni in June.  Labs in April with HCV RNA quant 80, log 1.903. Unclear how long she was on Harvoni at time of labs. Alkaline phosphatase 123, AST 42, ALT 26, total bilirubin 1.2, creatinine 1.61, platelets 55,000, hemoglobin 11.8.   Had labs done last month or so per patient. Viral illness last week with N/V/D. No melena, brbpr. No heartburn on zantac. No stomach pain. Overall feels well. No complaints. States she completed Harvoni and took medication as directed. Recent ruq u/s as outlined below negative for hepatoma.   Patient reported recent labs in [place] back in July. Obtain copy. Ferritin 566 high, creatinine 1.03, BUN 14, iron 296 high, iron saturations 91% high, hemoglobin 11 low.  Current Outpatient Prescriptions  Medication Sig Dispense Refill  . acamprosate (CAMPRAL) 333 MG tablet Take 333 mg by mouth 3 (three) times daily with meals.     [name] aspirin EC 81 MG tablet Take 81 mg by mouth every morning.    . folic acid (FOLVITE) 1 MG tablet Take 1 tablet (1 mg total) by mouth daily. 30 tablet 11  . furosemide (LASIX) 40 MG tablet Take 40 mg by mouth daily.     [name] glimepiride (AMARYL) 2 MG tablet Take 2 mg by mouth daily.     [name] HYDROcodone-acetaminophen (NORCO/VICODIN) 5-325 MG per tablet Take 1 tablet by mouth every 6 (six) hours as needed for moderate pain.    [name] lisinopril-hydrochlorothiazide (PRINZIDE,ZESTORETIC) 10-12.5 MG per tablet Take 1 tablet by mouth daily.    [name] lubiprostone (AMITIZA) 24 MCG capsule Take 24 mcg by  mouth 2 (two) times daily with a meal. Only as needed    . propranolol (INDERAL) 20 MG tablet Take 1 tablet (20 mg total) by mouth 2 (two) times daily. 60 tablet 3  . ranitidine (ZANTAC) 300 MG capsule Take 300 mg by mouth daily.    [name] spironolactone (ALDACTONE) 50 MG tablet Take 50 mg by mouth daily.     No current facility-administered medications for this visit.    Allergies as of 08/02/2015  . (No Known Allergies)   Past Surgical History  Procedure Laterality Date  . Cesarean section  1988    [facility]  . Cholecystectomy  greater than 10 yrs    [facility]  . Appendectomy      age 44  . Esophageal biopsy  12/18/2011       . Colonoscopy  12/18/11    minimal anal canal hemorrhoids, friable rectal and colonic mucosa, left-sided diverticulosis, repeat in 2022.   [name] Esophagogastroduodenoscopy  12/18/11    3 columns of Grade II esophageal varices, reflux esohpagitis, portal gastropathy, path with chronic gastritis, negative H.pylori, surveillance in June 2014  . Multiple extractions with alveoloplasty  03/08/2012    Procedure: MULTIPLE EXTRACION WITH ALVEOLOPLASTY;  Surgeon: Gae Bon, DDS;  Location: [place];  Service: Oral Surgery;  Laterality: N/A;  . Esophagogastroduodenoscopy (egd) with propofol N/A 04/13/2014    Dr. [name]: grade 1-2 varices, hiatal hernia, portal gastropathy. NO need for  further surveillance while on prophylaxis unless evidence of bleeding   Past Medical History  Diagnosis Date  . Diabetes mellitus   . Hypertension   . Cirrhosis   . Hepatitis B antibody positive   . Ulcer     ?  [name] Acid reflux disease with ulcer   . Depression   . Helicobacter pylori ab+     July 2010, s/p treatment  . GERD (gastroesophageal reflux disease)   . Gallstones   . Anxiety   . Blood transfusion   . Hepatitis C     and B positive antibody  . Other pancytopenia 10/27/2013  . Folate deficiency 03/28/2014    Noted on 03/23/2014.  Folate 1 mg prescribed.  . Sleep apnea     Stop Bang score  of 5  . History of alcoholism   . Arthritis   . Liver cirrhosis   . DDD (degenerative disc disease), lumbar   . Esophageal varices 09/18/2014    ROS:  General: Negative for anorexia, weight loss, fever, chills, fatigue, weakness. ENT: Negative for hoarseness, difficulty swallowing , nasal congestion. CV: Negative for chest pain, angina, palpitations, dyspnea on exertion, peripheral edema.  Respiratory: Negative for dyspnea at rest, dyspnea on exertion, cough, sputum, wheezing.  GI: See history of present illness. GU:  Negative for dysuria, hematuria, urinary incontinence, urinary frequency, nocturnal urination.  Endo: Negative for unusual weight change.    Physical Examination:   BP 130/71 mmHg  Pulse 89  Temp(Src) 98.3 F (36.8 C) (Oral)  Ht 5\' 4"  (1.626 m)  Wt 191 lb 3.2 oz (86.728 kg)  BMI 32.80 kg/m2  General: Well-nourished, well-developed in no acute distress.  Eyes: No icterus. Mouth: Oropharyngeal mucosa moist and pink , no lesions erythema or exudate. Lungs: Clear to auscultation bilaterally.  Heart: Regular rate and rhythm, no murmurs rubs or gallops.  Abdomen: Bowel sounds are normal, nontender, nondistended, no hepatosplenomegaly or masses, no abdominal bruits or hernia , no rebound or guarding.   Extremities: No lower extremity edema. No clubbing or deformities. Neuro: Alert and oriented x 4   Skin: Warm and dry, no jaundice.   Psych: Alert and cooperative, normal mood and affect.  Labs:  As above  Imaging Studies: US Abdomen Limited Ruq  August 26, 2015   CLINICAL DATA:  Subsequent encounter for hepatitis-C with cirrhosis down 11/03/2015  EXAM: US ABDOMEN LIMITED - RIGHT UPPER QUADRANT  COMPARISON:  No gallstones or gallbladder wall thickening. No pericholecystic fluid. The sonographer reports no sonographic Murphy's sign.  FINDINGS: Gallbladder:  Surgically absent.  Common bile duct:  Diameter: 8-9 mm, slightly decreased in the interval in this patient status post  cholecystectomy.  Liver:  Coarsened heterogeneous echotexture with nodular contour, imaging features compatible with the reported clinical history of cirrhosis. No focal mass lesion within the liver parenchyma.  IMPRESSION: Stable exam. No focal abnormality in the liver with underlying hepatic changes compatible with cirrhosis.   Electronically Signed   By: Misty Stanley M.D.   On: 2015/08/26 09:13

## 2015-08-02 NOTE — Progress Notes (Signed)
Please let patient know that her labs from 06/2015 have been reviewed. They were actually ordered by Dr. Hinda Lenis. She is still in need of labs to follow up the hepatitis C and her liver.   Also her iron and ferritin were high. Please ask if she is taking any iron supplements, has received any injections or transfusions. Is she drinking etoh.   IF SHE ANSWERS NO TO ABOVE QUESTIONS, THEN HAVE HER DO THE HEMOCHROMATOSIS LABS TOO.

## 2015-08-03 NOTE — Progress Notes (Signed)
CC'ED TO PCP 

## 2015-08-07 NOTE — Progress Notes (Signed)
Pt is aware. She is not taking any iron supplements and she is not drinking any alcohol. Will order hemachromotosis labs. Pt asked that I mail her orders to her. She has her labs done at [facility].

## 2015-08-07 NOTE — Progress Notes (Signed)
Lab orders mailed to the pt.  

## 2015-08-15 ENCOUNTER — Other Ambulatory Visit: Payer: Self-pay | Admitting: Neurosurgery

## 2015-08-16 ENCOUNTER — Encounter (HOSPITAL_COMMUNITY): Payer: Self-pay | Admitting: *Deleted

## 2015-08-16 ENCOUNTER — Other Ambulatory Visit (HOSPITAL_COMMUNITY): Payer: Medicaid Other

## 2015-08-16 MED ORDER — CEFAZOLIN SODIUM-DEXTROSE 2-3 GM-% IV SOLR
2.0000 g | INTRAVENOUS | Status: DC
  Filled 2015-08-16: qty 50

## 2015-08-16 NOTE — H&P (Signed)
Gabriella Leon is an 59 y.o. female.   Chief Complaint: left arm weakness HPI: pain in the neck with radiation to the left arm associated with numbness and difficulty walking. Had a cervical mri which showed hnp at c34 with myelopathy.  Past Medical History  Diagnosis Date  . Diabetes mellitus   . Hypertension   . Cirrhosis   . Hepatitis B antibody positive   . Ulcer     ?  [name] Acid reflux disease with ulcer   . Depression   . Helicobacter pylori ab+     July 2010, s/p treatment  . GERD (gastroesophageal reflux disease)   . Gallstones   . Anxiety   . Blood transfusion   . Hepatitis C     and B positive antibody  . Other pancytopenia 10/27/2013  . Folate deficiency 03/28/2014    Noted on 03/23/2014.  Folate 1 mg prescribed.  . Sleep apnea     Stop Bang score of 5  . History of alcoholism   . Arthritis   . Liver cirrhosis   . DDD (degenerative disc disease), lumbar   . Esophageal varices 09/18/2014  . Chronic kidney disease   . Headache     migraines    Past Surgical History  Procedure Laterality Date  . Cesarean section  1988    [facility]  . Cholecystectomy  greater than 10 yrs    [facility]  . Appendectomy      age 59  . Esophageal biopsy  12/18/2011       . Colonoscopy  12/18/11    minimal anal canal hemorrhoids, friable rectal and colonic mucosa, left-sided diverticulosis, repeat in 2022.   [name] Esophagogastroduodenoscopy  12/18/11    3 columns of Grade II esophageal varices, reflux esohpagitis, portal gastropathy, path with chronic gastritis, negative H.pylori, surveillance in June 2014  . Multiple extractions with alveoloplasty  03/08/2012    Procedure: MULTIPLE EXTRACION WITH ALVEOLOPLASTY;  Surgeon: Gae Bon, DDS;  Location: [place];  Service: Oral Surgery;  Laterality: N/A;  . Esophagogastroduodenoscopy (egd) with propofol N/A 04/13/2014    Dr. [name]: grade 1-2 varices, hiatal hernia, portal gastropathy. NO need for further surveillance while on prophylaxis unless  evidence of bleeding    Family History  Problem Relation Age of Onset  . Diabetes Mother   . Cancer Paternal Uncle     passed away age 77  . Anesthesia problems Neg Hx   . Hypotension Neg Hx   . Malignant hyperthermia Neg Hx   . Pseudochol deficiency Neg Hx    Social History:  reports that she has been smoking Cigarettes.  She has a 10 pack-year smoking history. She has never used smokeless tobacco. She reports that she does not drink alcohol or use illicit drugs.  Allergies: No Known Allergies  No prescriptions prior to admission    No results found for this or any previous visit (from the past 48 hour(s)). No results found.  Review of Systems  Eyes: Negative.   Respiratory: Negative.   Cardiovascular: Negative.   Gastrointestinal: Negative.   Genitourinary: Negative.   Musculoskeletal: Positive for neck pain.  Skin: Negative.  Negative for itching.  Neurological: Positive for sensory change, focal weakness and weakness.  Endo/Heme/Allergies: Negative.   Psychiatric/Behavioral: Positive for depression.    There were no vitals taken for this visit. Physical Exam  Constitutional: She is oriented to person, place, and time. She appears well-developed.  HENT:  Head: Normocephalic.  Neck: Normal range of motion.  Pain with neck moility  Cardiovascular: Normal rate.   Respiratory: Effort normal.  GI: Soft.  Musculoskeletal: Normal range of motion.  Neurological: She is alert and oriented to person, place, and time. She displays abnormal reflex.  Weakness of left deltoid and biceps.weakness of thenar muscle in left .poor balance walking in a straight line.  Sensory normal   mri shows severe stenosis swcondary to a hnp at c34 with changes in the cord. Emg/ncv are negatives  Assessment/Plan Decompression and fusion at c34. She is aware of risks and benefits such as paralysis, csf leak, no improvement,infection  Gabriella Leon M 08/16/2015, 9:32 PM

## 2015-08-16 NOTE — Progress Notes (Signed)
Pt denies SOB, chest pain, and being under the care of a cardiologist. Pt denies having a stress test, echo and cardiac cath. Pt denies having a chest x ray and EKG within the last year. Pt made aware to stop  taking Aspirin, otc vitamins and herbal medications. Do not take any NSAIDs ie: Ibuprofen, Advil, Naproxen or any medication containing Aspirin. Pt verbalized understanding of all pre-op instructions. 

## 2015-08-17 ENCOUNTER — Encounter (HOSPITAL_COMMUNITY)
Admission: RE | Disposition: A | Discharge status: Home / Self Care | Payer: Self-pay | Source: Ambulatory Visit | Attending: Neurosurgery

## 2015-08-17 ENCOUNTER — Inpatient Hospital Stay (HOSPITAL_COMMUNITY): Payer: Medicaid Other | Admitting: Certified Registered"

## 2015-08-17 ENCOUNTER — Ambulatory Visit (HOSPITAL_COMMUNITY)
Admission: RE | Admit: 2015-08-17 | Discharge: 2015-08-17 | Disposition: A | Discharge status: Home / Self Care | Payer: Medicaid Other | Source: Ambulatory Visit | Attending: Neurosurgery | Admitting: Neurosurgery

## 2015-08-17 ENCOUNTER — Encounter (HOSPITAL_COMMUNITY): Payer: Self-pay | Admitting: General Practice

## 2015-08-17 ENCOUNTER — Inpatient Hospital Stay (HOSPITAL_COMMUNITY): Payer: Medicaid Other

## 2015-08-17 DIAGNOSIS — M4802 Spinal stenosis, cervical region: Secondary | ICD-10-CM | POA: Insufficient documentation

## 2015-08-17 DIAGNOSIS — R509 Fever, unspecified: Secondary | ICD-10-CM

## 2015-08-17 DIAGNOSIS — R05 Cough: Secondary | ICD-10-CM | POA: Diagnosis not present

## 2015-08-17 DIAGNOSIS — Z5309 Procedure and treatment not carried out because of other contraindication: Secondary | ICD-10-CM | POA: Insufficient documentation

## 2015-08-17 DIAGNOSIS — R059 Cough, unspecified: Secondary | ICD-10-CM

## 2015-08-17 HISTORY — DX: Chronic kidney disease, unspecified: N18.9

## 2015-08-17 HISTORY — DX: Headache: R51

## 2015-08-17 HISTORY — DX: Headache, unspecified: R51.9

## 2015-08-17 LAB — CBC
HCT: 28.8 % — ABNORMAL LOW (ref 36.0–46.0)
Hemoglobin: 9.9 g/dL — ABNORMAL LOW (ref 12.0–15.0)
MCH: 34.3 pg — ABNORMAL HIGH (ref 26.0–34.0)
MCHC: 34.4 g/dL (ref 30.0–36.0)
MCV: 99.7 fL (ref 78.0–100.0)
PLATELETS: 36 10*3/uL — AB (ref 150–400)
RBC: 2.89 MIL/uL — ABNORMAL LOW (ref 3.87–5.11)
RDW: 12.7 % (ref 11.5–15.5)
WBC: 2 10*3/uL — AB (ref 4.0–10.5)

## 2015-08-17 LAB — COMPREHENSIVE METABOLIC PANEL
ALK PHOS: 117 U/L (ref 38–126)
ALT: 28 U/L (ref 14–54)
AST: 48 U/L — AB (ref 15–41)
Albumin: 3.3 g/dL — ABNORMAL LOW (ref 3.5–5.0)
Anion gap: 8 (ref 5–15)
BILIRUBIN TOTAL: 1.7 mg/dL — AB (ref 0.3–1.2)
BUN: 11 mg/dL (ref 6–20)
CALCIUM: 9.1 mg/dL (ref 8.9–10.3)
CO2: 26 mmol/L (ref 22–32)
CREATININE: 1.25 mg/dL — AB (ref 0.44–1.00)
Chloride: 101 mmol/L (ref 101–111)
GFR calc Af Amer: 55 mL/min — ABNORMAL LOW (ref >60)
GFR, EST NON AFRICAN AMERICAN: 47 mL/min — AB (ref >60)
Glucose, Bld: 110 mg/dL — ABNORMAL HIGH (ref 65–99)
Potassium: 3.5 mmol/L (ref 3.5–5.1)
Sodium: 135 mmol/L (ref 135–145)
TOTAL PROTEIN: 7.2 g/dL (ref 6.5–8.1)

## 2015-08-17 LAB — GLUCOSE, CAPILLARY: Glucose-Capillary: 116 mg/dL — ABNORMAL HIGH (ref 65–99)

## 2015-08-17 LAB — PROTIME-INR
INR: 1.19 (ref 0.00–1.49)
Prothrombin Time: 15.3 seconds — ABNORMAL HIGH (ref 11.6–15.2)

## 2015-08-17 LAB — SURGICAL PCR SCREEN
MRSA, PCR: NEGATIVE
Staphylococcus aureus: NEGATIVE

## 2015-08-17 SURGERY — ANTERIOR CERVICAL DECOMPRESSION/DISCECTOMY FUSION 1 LEVEL
Anesthesia: General

## 2015-08-17 MED ORDER — FENTANYL CITRATE (PF) 250 MCG/5ML IJ SOLN
INTRAMUSCULAR | Status: AC
  Filled 2015-08-17: qty 5

## 2015-08-17 MED ORDER — MUPIROCIN 2 % EX OINT
1.0000 "application " | TOPICAL_OINTMENT | Freq: Once | CUTANEOUS | Status: AC
  Administered 2015-08-17: 1 via TOPICAL
  Filled 2015-08-17: qty 22

## 2015-08-17 MED ORDER — SODIUM CHLORIDE 0.9 % IV SOLN
INTRAVENOUS | Status: DC
  Administered 2015-08-17: 07:00:00 via INTRAVENOUS

## 2015-08-17 MED ORDER — PROPOFOL 10 MG/ML IV BOLUS
INTRAVENOUS | Status: AC
  Filled 2015-08-17: qty 20

## 2015-08-17 MED ORDER — MIDAZOLAM HCL 2 MG/2ML IJ SOLN
INTRAMUSCULAR | Status: AC
  Filled 2015-08-17: qty 4

## 2015-08-17 NOTE — Anesthesia Preprocedure Evaluation (Addendum)
Anesthesia Evaluation  Patient identified by MRN, date of birth, ID band Patient awake    Reviewed: Allergy & Precautions, NPO status , Patient's Chart, lab work & pertinent test results  Airway Mallampati: II  TM Distance: >3 FB Neck ROM: Full    Dental   Pulmonary sleep apnea , Current Smoker,  breath sounds clear to auscultation        Cardiovascular hypertension, Rhythm:Regular Rate:Normal     Neuro/Psych    GI/Hepatic PUD, GERD-  ,(+) Hepatitis -  Endo/Other  diabetes  Renal/GU Renal disease     Musculoskeletal   Abdominal   Peds  Hematology   Anesthesia Other Findings   Reproductive/Obstetrics                            Anesthesia Physical Anesthesia Plan  ASA: III  Anesthesia Plan: General   Post-op Pain Management:    Induction: Intravenous  Airway Management Planned: Oral ETT  Additional Equipment:   Intra-op Plan:   Post-operative Plan: Possible Post-op intubation/ventilation  Informed Consent:   Plan Discussed with: CRNA and Anesthesiologist  Anesthesia Plan Comments:         Anesthesia Quick Evaluation

## 2015-08-17 NOTE — Progress Notes (Signed)
Dr. Finis Bud called to inquire about patient's fever.  Patient stated that she had a fever about 1 month ago and had an antibiotic prescribed at that time.  Patient states she is coughing up yellow phlem.  This relayed to Dr. Finis Bud and she stated to send the patient up to the OR.

## 2015-08-17 NOTE — Progress Notes (Signed)
Patient presents with a productive cough and states that she has recently had a fever.  Patient is afebrile at this time.  CXR ordered per anesthesia protocol and Dr. Oletta Lamas made aware. Attempted to call Dr. Joya Salm but did not receive an answer, will continue to attempt to reach Dr. Joya Salm.

## 2015-08-23 ENCOUNTER — Telehealth: Payer: Self-pay | Admitting: Hematology

## 2015-08-23 NOTE — Telephone Encounter (Signed)
new patient appt-s/w patient and gave np appt for 09/07 with an 2:45 arrival w/Dr. Irene Limbo. Referrign Dr. Stoney Bang Dx- Low Plts  Information scanned labs in [facility] for review

## 2015-08-29 ENCOUNTER — Telehealth: Payer: Self-pay | Admitting: Hematology

## 2015-08-29 ENCOUNTER — Other Ambulatory Visit (HOSPITAL_BASED_OUTPATIENT_CLINIC_OR_DEPARTMENT_OTHER): Payer: Medicaid Other

## 2015-08-29 ENCOUNTER — Ambulatory Visit (HOSPITAL_BASED_OUTPATIENT_CLINIC_OR_DEPARTMENT_OTHER): Payer: Medicaid Other | Admitting: Hematology

## 2015-08-29 ENCOUNTER — Encounter: Payer: Self-pay | Admitting: Hematology

## 2015-08-29 VITALS — BP 135/74 | HR 74 | Temp 98.8°F | Resp 16 | Ht 64.0 in | Wt 185.9 lb

## 2015-08-29 DIAGNOSIS — D61818 Other pancytopenia: Secondary | ICD-10-CM | POA: Diagnosis not present

## 2015-08-29 DIAGNOSIS — D696 Thrombocytopenia, unspecified: Secondary | ICD-10-CM | POA: Diagnosis present

## 2015-08-29 DIAGNOSIS — D693 Immune thrombocytopenic purpura: Secondary | ICD-10-CM | POA: Insufficient documentation

## 2015-08-29 LAB — CBC & DIFF AND RETIC
BASO%: 0.6 % (ref 0.0–2.0)
Basophils Absolute: 0 10*3/uL (ref 0.0–0.1)
EOS%: 5 % (ref 0.0–7.0)
Eosinophils Absolute: 0.2 10*3/uL (ref 0.0–0.5)
HCT: 32.4 % — ABNORMAL LOW (ref 34.8–46.6)
HGB: 11.1 g/dL — ABNORMAL LOW (ref 11.6–15.9)
Immature Retic Fract: 1.5 % — ABNORMAL LOW (ref 1.60–10.00)
LYMPH%: 25.6 % (ref 14.0–49.7)
MCH: 34.6 pg — AB (ref 25.1–34.0)
MCHC: 34.3 g/dL (ref 31.5–36.0)
MCV: 100.9 fL (ref 79.5–101.0)
MONO#: 0.3 10*3/uL (ref 0.1–0.9)
MONO%: 8 % (ref 0.0–14.0)
NEUT%: 60.8 % (ref 38.4–76.8)
NEUTROS ABS: 2.2 10*3/uL (ref 1.5–6.5)
PLATELETS: 51 10*3/uL — AB (ref 145–400)
RBC: 3.21 10*6/uL — AB (ref 3.70–5.45)
RDW: 12.9 % (ref 11.2–14.5)
Retic %: 2.79 % — ABNORMAL HIGH (ref 0.70–2.10)
Retic Ct Abs: 89.56 10*3/uL (ref 33.70–90.70)
WBC: 3.6 10*3/uL — AB (ref 3.9–10.3)
lymph#: 0.9 10*3/uL (ref 0.9–3.3)

## 2015-08-29 LAB — COMPREHENSIVE METABOLIC PANEL (CC13)
ALT: 27 U/L (ref 0–55)
ANION GAP: 8 meq/L (ref 3–11)
AST: 43 U/L — ABNORMAL HIGH (ref 5–34)
Albumin: 3.8 g/dL (ref 3.5–5.0)
Alkaline Phosphatase: 114 U/L (ref 40–150)
BUN: 8.7 mg/dL (ref 7.0–26.0)
CALCIUM: 10.1 mg/dL (ref 8.4–10.4)
CHLORIDE: 102 meq/L (ref 98–109)
CO2: 26 mEq/L (ref 22–29)
CREATININE: 1.2 mg/dL — AB (ref 0.6–1.1)
EGFR: 57 mL/min/{1.73_m2} — AB (ref >90)
Glucose: 100 mg/dl (ref 70–140)
POTASSIUM: 4.3 meq/L (ref 3.5–5.1)
Sodium: 137 mEq/L (ref 136–145)
Total Bilirubin: 2.03 mg/dL — ABNORMAL HIGH (ref 0.20–1.20)
Total Protein: 7.9 g/dL (ref 6.4–8.3)

## 2015-08-29 LAB — CHCC SMEAR

## 2015-08-29 NOTE — Progress Notes (Signed)
[name]    HEMATOLOGY/ONCOLOGY CONSULTATION NOTE  Date of Service: 08/29/2015  Patient Care Team: Neale Burly, MD as PCP - General (Internal Medicine) Daneil Dolin, MD (Gastroenterology)  CHIEF COMPLAINTS/PURPOSE OF CONSULTATION:  Evaluation and management of thrombocytopenia.  HISTORY OF PRESENTING ILLNESS:   Gabriella Leon is a wonderful 60 y.o. female who has been referred to [place] by Dr .Neale Burly, MD  for evaluation and management of all thrombocytopenia especially in the setting of necessity of cervical spine surgery required for significant disc disease causing left upper extremity weakness or tingling and numbness.  Patient has a history of hypertension, diabetes, hepatitis C, liver cirrhosis with history of varices and splenomegaly, GERD, anxiety, sleep apnea, history of alcohol abuse.  She notes that she has significant cervical disc disease causing left upper extremity weakness, pain tingling and numbness for which she needed urgent spinal surgery.  She was set up for this last week but was discontinued due to having low platelet counts of 34,000.  She was referred to [place] for management of her thrombocytopenia perioperatively.  She notes a previous history of variceal bleeding.  He also noted increased bruising in her lower extremities. No other overt bleeding at this time.  She has been on aspirin low-dose daily for unclear reasons.  She notes she has never had a stroke or heart attack in the past.  She continues to smoke half to one pack of cigarettes daily.    She notes that she had a recent intercurrent viral  Upper respiratory tract infection which could potentially dropped her blood counts are lower than her baseline.  The symptoms have now resolved.  MEDICAL HISTORY:  Past Medical History  Diagnosis Date  . Diabetes mellitus   . Hypertension   . Cirrhosis   . Hepatitis B antibody positive   . Ulcer     ?  [name] Acid reflux disease with ulcer   . Depression   .  Helicobacter pylori ab+     July 2010, s/p treatment  . GERD (gastroesophageal reflux disease)   . Gallstones   . Anxiety   . Blood transfusion   . Hepatitis C     and B positive antibody  . Other pancytopenia 10/27/2013  . Folate deficiency 03/28/2014    Noted on 03/23/2014.  Folate 1 mg prescribed.  . Sleep apnea     Stop Bang score of 5  . History of alcoholism   . Arthritis   . Liver cirrhosis   . DDD (degenerative disc disease), lumbar   . Esophageal varices 09/18/2014  . Chronic kidney disease   . Headache     migraines    SURGICAL HISTORY: Past Surgical History  Procedure Laterality Date  . Cesarean section  1988    [facility]  . Cholecystectomy  greater than 10 yrs    [facility]  . Appendectomy      age 38  . Esophageal biopsy  12/18/2011       . Colonoscopy  12/18/11    minimal anal canal hemorrhoids, friable rectal and colonic mucosa, left-sided diverticulosis, repeat in 2022.   [name] Esophagogastroduodenoscopy  12/18/11    3 columns of Grade II esophageal varices, reflux esohpagitis, portal gastropathy, path with chronic gastritis, negative H.pylori, surveillance in June 2014  . Multiple extractions with alveoloplasty  03/08/2012    Procedure: MULTIPLE EXTRACION WITH ALVEOLOPLASTY;  Surgeon: Gae Bon, DDS;  Location: [facility];  Service: Oral Surgery;  Laterality: N/A;  . Esophagogastroduodenoscopy (egd)  with propofol N/A 04/13/2014    Dr. [name]: grade 1-2 varices, hiatal hernia, portal gastropathy. NO need for further surveillance while on prophylaxis unless evidence of bleeding    SOCIAL HISTORY: Social History   Social History  . Marital Status: Legally Separated    Spouse Name: N/A  . Number of Children: 1  . Years of Education: N/A   Occupational History  . disabled    Social History Main Topics  . Smoking status: Current Every Day Smoker -- 0.50 packs/day for 20 years    Types: Cigarettes  . Smokeless tobacco: Never Used  . Alcohol Use: No     Comment: last  use 2015  . Drug Use: No  . Sexual Activity: Yes    Birth Control/ Protection: Post-menopausal   Other Topics Concern  . Not on file   Social History Narrative    FAMILY HISTORY: Family History  Problem Relation Age of Onset  . Diabetes Mother   . Cancer Paternal Uncle     passed away age 71  . Anesthesia problems Neg Hx   . Hypotension Neg Hx   . Malignant hyperthermia Neg Hx   . Pseudochol deficiency Neg Hx     ALLERGIES:  has No Known Allergies.  MEDICATIONS:  Current Outpatient Prescriptions  Medication Sig Dispense Refill  . acamprosate (CAMPRAL) 333 MG tablet Take 333 mg by mouth 3 (three) times daily with meals.     . folic acid (FOLVITE) 1 MG tablet Take 1 tablet (1 mg total) by mouth daily. 30 tablet 11  . furosemide (LASIX) 40 MG tablet Take 40 mg by mouth daily.     [name] glimepiride (AMARYL) 2 MG tablet Take 2 mg by mouth daily.     [name] HYDROcodone-acetaminophen (NORCO/VICODIN) 5-325 MG per tablet Take 1 tablet by mouth every 6 (six) hours as needed for moderate pain.    [name] lisinopril-hydrochlorothiazide (PRINZIDE,ZESTORETIC) 10-12.5 MG per tablet Take 1 tablet by mouth daily.    [name] lubiprostone (AMITIZA) 24 MCG capsule Take 24 mcg by mouth 2 (two) times daily as needed for constipation.     . propranolol (INDERAL) 20 MG tablet Take 1 tablet (20 mg total) by mouth 2 (two) times daily. 60 tablet 3  . ranitidine (ZANTAC) 300 MG capsule Take 300 mg by mouth daily.    [name] spironolactone (ALDACTONE) 50 MG tablet Take 50 mg by mouth daily.     No current facility-administered medications for this visit.    REVIEW OF SYSTEMS:    10 Point review of Systems was done is negative except as noted above.  PHYSICAL EXAMINATION: ECOG PERFORMANCE STATUS: 1 - Symptomatic but completely ambulatory  . Filed Vitals:   08/29/15 1402  Height: 5\' 4"  (1.626 m)  Weight: 185 lb 14.4 oz (84.324 kg)   Filed Weights   08/29/15 1402  Weight: 185 lb 14.4 oz (84.324 kg)   .Body mass  index is 31.89 kg/(m^2).  GENERAL:alert, in no acute distress and comfortable SKIN: few healing bruises on her lower extremities EYES: normal, conjunctiva are pink and non-injected, sclera clear OROPHARYNX:no exudate, no erythema and lips, buccal mucosa, and tongue normal  NECK: supple, no JVD, thyroid normal size, non-tender, without nodularity LYMPH:  no palpable lymphadenopathy in the cervical, axillary or inguinal LUNGS: clear to auscultation with normal respiratory effort HEART: regular rate & rhythm,  no murmurs. ABDOMEN: abdomen soft, non-tender, normoactive bowel sounds, Palpable splenomegaly 2-3 fingerbreadths below costal margin in midclavicular line. Musculoskeletal: bilateral trace edema PSYCH:  alert & oriented x 3 with fluent speech NEURO: left upper extremity 4/5 power  LABORATORY DATA:  I have reviewed the data as listed  . CBC Latest Ref Rng 09/03/2015 08/29/2015 08/17/2015  WBC 3.9 - 10.3 10e3/uL 2.7(L) 3.6(L) 2.0(L)  Hemoglobin 11.6 - 15.9 g/dL 10.5(L) 11.1(L) 9.9(L)  Hematocrit 34.8 - 46.6 % 31.3(L) 32.4(L) 28.8(L)  Platelets 145 - 400 10e3/uL 45(L) 51(L) 36(L)    . CMP Latest Ref Rng 08/29/2015 08/17/2015 07/29/2014  Glucose 70 - 140 mg/dl 100 110(H) 124(H)  BUN 7.0 - 26.0 mg/dL 8.7 11 15   Creatinine 0.6 - 1.1 mg/dL 1.2(H) 1.25(H) 1.92(H)  Sodium 136 - 145 mEq/L 137 135 136  Potassium 3.5 - 5.1 mEq/L 4.3 3.5 4.5  Chloride 101 - 111 mmol/L - 101 104  CO2 22 - 29 mEq/L 26 26 20   Calcium 8.4 - 10.4 mg/dL 10.1 9.1 8.9  Total Protein 6.4 - 8.3 g/dL 7.9 7.2 -  Total Bilirubin 0.20 - 1.20 mg/dL 2.03(H) 1.7(H) -  Alkaline Phos 40 - 150 U/L 114 117 -  AST 5 - 34 U/L 43(H) 48(H) -  ALT 0 - 55 U/L 27 28 -     RADIOGRAPHIC STUDIES: I have personally reviewed the radiological images as listed and agreed with the findings in the report. Dg Chest 2 View  08/17/2015   CLINICAL DATA:  Fever and cough  EXAM: CHEST - 2 VIEW  COMPARISON:  None.  FINDINGS: The heart size and  mediastinal contours are within normal limits. Both lungs are clear. The visualized skeletal structures are unremarkable.  IMPRESSION: No active disease.   Electronically Signed   By: Inez Catalina M.D.   On: 08/17/2015 07:31   US Abdomen Limited Ruq  08/01/2015   CLINICAL DATA:  Subsequent encounter for hepatitis-C with cirrhosis down 11/03/2015  EXAM: US ABDOMEN LIMITED - RIGHT UPPER QUADRANT  COMPARISON:  No gallstones or gallbladder wall thickening. No pericholecystic fluid. The sonographer reports no sonographic Murphy's sign.  FINDINGS: Gallbladder:  Surgically absent.  Common bile duct:  Diameter: 8-9 mm, slightly decreased in the interval in this patient status post cholecystectomy.  Liver:  Coarsened heterogeneous echotexture with nodular contour, imaging features compatible with the reported clinical history of cirrhosis. No focal mass lesion within the liver parenchyma.  IMPRESSION: Stable exam. No focal abnormality in the liver with underlying hepatic changes compatible with cirrhosis.   Electronically Signed   By: Misty Stanley M.D.   On: 08/01/2015 09:13    ASSESSMENT & PLAN:   59 year old African American female with multiple medical comorbidities including hepatitis C, history of alcohol abuse, liver cirrhosis with portal hypertension referred to [place] for evaluation and management of  1] thrombocytopenia this is likely related to patient's hepatitis C, hypersplenism due to splenomegaly and liver disease.cannot rule out an element of ITP. Baseline platelet counts recently appear to be around 40-50,000 these appear to have dropped further due to an intercurrent viral upper respiratory tract infection. 2] pancytopenia likely related to liver disease, hepatitis C and hypersplenism. Plan -we'll check a peripheral blood smear -Patient is planning to have required and necessary C-spine surgery with Dr. Leeroy Cha from neurosurgery as soon as it is possible safely. -for this type of surgery  she would need platelet counts of at least 75,000 and preferably closer to 100k which would be difficult to achieve that platelet transfusions alone given her hypersplenism which might lead to significant platelets sequestration. Also she would need to have a platelet counts  remain high for increased about 2 weeks after surgery to reduce the risk of postoperative bleeding. -we will have the patient discontinue her aspirin due to increased bleeding risk. -We'll check the patient's coagulation labs to rule out other associated coagulopathy given her history of liver cirrhosis.with PT/PTT and fibrinogen level.  About a third of patients with liver cirrhosis also have a risk of dysfibrinogenemia. -we'll start the patient on Romiplostim to help boost her platelet countswould start at a dose of 2 mcg/kg per week and try to target account of 100,000 for a little higher if possible. -we will follow the patient with repeat labs in 1 week to define further dose escalation of Romiplostim. -patient was counseled on complete smoking cessation to reduce the risk of thrombosis with Romiplostim and to allow for better results with her C-spine surgery and to reduce other perioperative risks associated with smoking.  Return to care in one week with Dr. Irene Limbo with CBC, CMP.  All of the patients questions were answered with apparent satisfaction. The patient knows to call the clinic with any problems, questions or concerns.  I spent 50 minutes counseling the patient face to face. The total time spent in the appointment was 60 minutes and more than 50% was on counseling and direct patient cares.    Sullivan Lone MD [place] AAHIVMS [facility] Hematology/Oncology Physician [place]  (Office):       [phone] (Work cell):  [phone] (Fax):           [phone]  08/29/2015 2:20 PM

## 2015-08-29 NOTE — Telephone Encounter (Signed)
Called and left a message with added lab 9/12

## 2015-08-29 NOTE — Telephone Encounter (Signed)
Appointments made and avs printed for patient °

## 2015-08-30 LAB — PROTHROMBIN TIME
INR: 1.16 (ref <1.50)
Prothrombin Time: 14.9 seconds (ref 11.6–15.2)

## 2015-08-30 LAB — FOLATE RBC: RBC FOLATE: 1092 ng/mL (ref >280)

## 2015-08-30 LAB — APTT: aPTT: 34 seconds (ref 24–37)

## 2015-08-30 LAB — FIBRINOGEN: Fibrinogen: 219 mg/dL (ref 204–475)

## 2015-08-30 LAB — VITAMIN B12: VITAMIN B 12: 719 pg/mL (ref 211–911)

## 2015-08-30 LAB — FERRITIN CHCC: FERRITIN: 470 ng/mL — AB (ref 9–269)

## 2015-09-03 ENCOUNTER — Encounter: Payer: Self-pay | Admitting: Hematology

## 2015-09-03 ENCOUNTER — Ambulatory Visit (HOSPITAL_BASED_OUTPATIENT_CLINIC_OR_DEPARTMENT_OTHER): Payer: Medicaid Other

## 2015-09-03 ENCOUNTER — Other Ambulatory Visit (HOSPITAL_BASED_OUTPATIENT_CLINIC_OR_DEPARTMENT_OTHER): Payer: Medicaid Other

## 2015-09-03 VITALS — BP 123/64 | HR 77 | Temp 98.4°F

## 2015-09-03 DIAGNOSIS — D696 Thrombocytopenia, unspecified: Secondary | ICD-10-CM

## 2015-09-03 DIAGNOSIS — D693 Immune thrombocytopenic purpura: Secondary | ICD-10-CM

## 2015-09-03 DIAGNOSIS — D61818 Other pancytopenia: Secondary | ICD-10-CM

## 2015-09-03 LAB — CBC WITH DIFFERENTIAL/PLATELET
BASO%: 1.1 % (ref 0.0–2.0)
Basophils Absolute: 0 10*3/uL (ref 0.0–0.1)
EOS ABS: 0.1 10*3/uL (ref 0.0–0.5)
EOS%: 5.1 % (ref 0.0–7.0)
HCT: 31.3 % — ABNORMAL LOW (ref 34.8–46.6)
HGB: 10.5 g/dL — ABNORMAL LOW (ref 11.6–15.9)
LYMPH%: 32.7 % (ref 14.0–49.7)
MCH: 34 pg (ref 25.1–34.0)
MCHC: 33.5 g/dL (ref 31.5–36.0)
MCV: 101.3 fL — AB (ref 79.5–101.0)
MONO#: 0.3 10*3/uL (ref 0.1–0.9)
MONO%: 9.2 % (ref 0.0–14.0)
NEUT%: 51.9 % (ref 38.4–76.8)
NEUTROS ABS: 1.4 10*3/uL — AB (ref 1.5–6.5)
PLATELETS: 45 10*3/uL — AB (ref 145–400)
RBC: 3.09 10*6/uL — AB (ref 3.70–5.45)
RDW: 13 % (ref 11.2–14.5)
WBC: 2.7 10*3/uL — AB (ref 3.9–10.3)
lymph#: 0.9 10*3/uL (ref 0.9–3.3)

## 2015-09-03 MED ORDER — ROMIPLOSTIM 250 MCG ~~LOC~~ SOLR
2.0000 ug/kg | Freq: Once | SUBCUTANEOUS | Status: AC
  Administered 2015-09-03: 170 ug via SUBCUTANEOUS
  Filled 2015-09-03: qty 0.34

## 2015-09-03 NOTE — Patient Instructions (Signed)
Romiplostim injection What is this medicine? ROMIPLOSTIM (roe mi PLOE stim) helps your body make more platelets. This medicine is used to treat low platelets caused by chronic idiopathic thrombocytopenic purpura (ITP). This medicine may be used for other purposes; ask your health care provider or pharmacist if you have questions. COMMON BRAND NAME(S): Nplate What should I tell my health care provider before I take this medicine? They need to know if you have any of these conditions: -cancer or myelodysplastic syndrome -low blood counts, like low white cell, platelet, or red cell counts -take medicines that treat or prevent blood clots -an unusual or allergic reaction to romiplostim, mannitol, other medicines, foods, dyes, or preservatives -pregnant or trying to get pregnant -breast-feeding How should I use this medicine? This medicine is for injection under the skin. It is given by a health care professional in a hospital or clinic setting. A special MedGuide will be given to you before your injection. Read this information carefully each time. Talk to your pediatrician regarding the use of this medicine in children. Special care may be needed. Overdosage: If you think you have taken too much of this medicine contact a poison control center or emergency room at once. NOTE: This medicine is only for you. Do not share this medicine with others. What if I miss a dose? It is important not to miss your dose. Call your doctor or health care professional if you are unable to keep an appointment. What may interact with this medicine? Interactions are not expected. This list may not describe all possible interactions. Give your health care provider a list of all the medicines, herbs, non-prescription drugs, or dietary supplements you use. Also tell them if you smoke, drink alcohol, or use illegal drugs. Some items may interact with your medicine. What should I watch for while using this  medicine? Your condition will be monitored carefully while you are receiving this medicine. Visit your prescriber or health care professional for regular checks on your progress and for the needed blood tests. It is important to keep all appointments. What side effects may I notice from receiving this medicine? Side effects that you should report to your doctor or health care professional as soon as possible: -allergic reactions like skin rash, itching or hives, swelling of the face, lips, or tongue -shortness of breath, chest pain, swelling in a leg -unusual bleeding or bruising Side effects that usually do not require medical attention (report to your doctor or health care professional if they continue or are bothersome): -dizziness -headache -muscle aches -pain in arms and legs -stomach pain -trouble sleeping This list may not describe all possible side effects. Call your doctor for medical advice about side effects. You may report side effects to FDA at 1-800-FDA-1088. Where should I keep my medicine? This drug is given in a hospital or clinic and will not be stored at home. NOTE: This sheet is a summary. It may not cover all possible information. If you have questions about this medicine, talk to your doctor, pharmacist, or health care provider.  2015, [employer]vier/[employer] Standard. (2008-08-07 15:13:04)  

## 2015-09-03 NOTE — Progress Notes (Signed)
Contacted pt to introduce myself and asked if she had any financial questions or concerns. Pt states no but wanted to know if her next injection would be her last one. I advised pt to ask the doctor on her next appointment.

## 2015-09-11 ENCOUNTER — Ambulatory Visit (HOSPITAL_BASED_OUTPATIENT_CLINIC_OR_DEPARTMENT_OTHER): Payer: Medicaid Other

## 2015-09-11 ENCOUNTER — Telehealth: Payer: Self-pay | Admitting: Hematology

## 2015-09-11 ENCOUNTER — Other Ambulatory Visit: Payer: Self-pay | Admitting: *Deleted

## 2015-09-11 ENCOUNTER — Ambulatory Visit (HOSPITAL_BASED_OUTPATIENT_CLINIC_OR_DEPARTMENT_OTHER): Payer: Medicaid Other | Admitting: Hematology

## 2015-09-11 ENCOUNTER — Encounter: Payer: Self-pay | Admitting: Hematology

## 2015-09-11 ENCOUNTER — Other Ambulatory Visit (HOSPITAL_BASED_OUTPATIENT_CLINIC_OR_DEPARTMENT_OTHER): Payer: Medicaid Other

## 2015-09-11 VITALS — BP 151/72 | HR 77 | Temp 98.5°F | Resp 18 | Ht 64.0 in | Wt 188.1 lb

## 2015-09-11 DIAGNOSIS — D61818 Other pancytopenia: Secondary | ICD-10-CM

## 2015-09-11 DIAGNOSIS — D696 Thrombocytopenia, unspecified: Secondary | ICD-10-CM

## 2015-09-11 DIAGNOSIS — D689 Coagulation defect, unspecified: Secondary | ICD-10-CM | POA: Diagnosis not present

## 2015-09-11 DIAGNOSIS — D693 Immune thrombocytopenic purpura: Secondary | ICD-10-CM

## 2015-09-11 DIAGNOSIS — D731 Hypersplenism: Secondary | ICD-10-CM

## 2015-09-11 LAB — CBC WITH DIFFERENTIAL/PLATELET
BASO%: 0.4 % (ref 0.0–2.0)
BASOS ABS: 0 10*3/uL (ref 0.0–0.1)
EOS ABS: 0.1 10*3/uL (ref 0.0–0.5)
EOS%: 4.9 % (ref 0.0–7.0)
HEMATOCRIT: 31.5 % — AB (ref 34.8–46.6)
HEMOGLOBIN: 10.7 g/dL — AB (ref 11.6–15.9)
LYMPH#: 0.9 10*3/uL (ref 0.9–3.3)
LYMPH%: 31.8 % (ref 14.0–49.7)
MCH: 34.1 pg — AB (ref 25.1–34.0)
MCHC: 34 g/dL (ref 31.5–36.0)
MCV: 100.3 fL (ref 79.5–101.0)
MONO#: 0.2 10*3/uL (ref 0.1–0.9)
MONO%: 9 % (ref 0.0–14.0)
NEUT%: 53.9 % (ref 38.4–76.8)
NEUTROS ABS: 1.4 10*3/uL — AB (ref 1.5–6.5)
Platelets: 130 10*3/uL — ABNORMAL LOW (ref 145–400)
RBC: 3.14 10*6/uL — ABNORMAL LOW (ref 3.70–5.45)
RDW: 13.1 % (ref 11.2–14.5)
WBC: 2.7 10*3/uL — AB (ref 3.9–10.3)

## 2015-09-11 MED ORDER — ROMIPLOSTIM 250 MCG ~~LOC~~ SOLR
2.0000 ug/kg | Freq: Once | SUBCUTANEOUS | Status: AC
  Administered 2015-09-11: 170 ug via SUBCUTANEOUS
  Filled 2015-09-11: qty 0.34

## 2015-09-11 MED ORDER — PHYTONADIONE 5 MG PO TABS: 5.0000 mg | ORAL_TABLET | Freq: Every day | ORAL | Status: DC

## 2015-09-11 NOTE — Telephone Encounter (Signed)
Vitamin K rx resent to pt pharmacy by C. Berniece Salines, NP due to medicaid denial of Dr. Irene Limbo.

## 2015-09-11 NOTE — Telephone Encounter (Signed)
Gave and printed avs °

## 2015-09-13 NOTE — Discharge Summary (Signed)
Patient was not admitted to the hospital. She was discharged without surgical intervention secondary to her low platelets.

## 2015-09-17 ENCOUNTER — Other Ambulatory Visit: Payer: Self-pay | Admitting: Neurosurgery

## 2015-09-17 NOTE — Progress Notes (Signed)
[name]  HEMATOLOGY ONCOLOGY PROGRESS NOTE  Date of service: 09/10/2015  Patient Care Team: Neale Burly, MD as PCP - General (Internal Medicine) Daneil Dolin, MD (Gastroenterology)  CC:  follow-up for thrombocytopenia   DIAGNOSIS: Thrombocytopenia this is likely related to patient's hepatitis C, hypersplenism due to splenomegaly and liver disease.cannot rule out an element of ITP.   CURRENT TREATMENT  Romiplostim 78mcg/kg to target platelet counts of 100,000 perioperatively for cervical spinal surgery for cervical myelopathy causing left upper extremity weakness.  Intend to treat preoperatively and then for at least 4 weeks postoperatively after which it could probably be held.  INTERVAL HISTORY:  Ms Gabriella Leon is here for her scheduled follow-up for thrombo-cytopenia evaluation. She had a neck some response to her first dose of endplate with her platelet counts bouncing to about 130,000. She notes that she is trying to cut down on her smoking and was encouraged to completely quit smoking to improve her outcomes her surgery and with regards to other health benefits and to reduce the risk of thrombosis with Nplate.  She was informed to contact her surgeon regarding surgery planning. No other acute new symptoms noted.  REVIEW OF SYSTEMS:    10 Point review of systems of done and is negative except as noted above.  . Past Medical History  Diagnosis Date  . Diabetes mellitus   . Hypertension   . Cirrhosis   . Hepatitis B antibody positive   . Ulcer     ?  [name] Acid reflux disease with ulcer   . Depression   . Helicobacter pylori ab+     July 2010, s/p treatment  . GERD (gastroesophageal reflux disease)   . Gallstones   . Anxiety   . Blood transfusion   . Hepatitis C     and B positive antibody  . Other pancytopenia 10/27/2013  . Folate deficiency 03/28/2014    Noted on 03/23/2014.  Folate 1 mg prescribed.  . Sleep apnea     Stop Bang score of 5  . History of alcoholism   .  Arthritis   . Liver cirrhosis   . DDD (degenerative disc disease), lumbar   . Esophageal varices 09/18/2014  . Chronic kidney disease   . Headache     migraines    . Past Surgical History  Procedure Laterality Date  . Cesarean section  1988    [facility]  . Cholecystectomy  greater than 10 yrs    [facility]  . Appendectomy      age 71  . Esophageal biopsy  12/18/2011       . Colonoscopy  12/18/11    minimal anal canal hemorrhoids, friable rectal and colonic mucosa, left-sided diverticulosis, repeat in 2022.   [name] Esophagogastroduodenoscopy  12/18/11    3 columns of Grade II esophageal varices, reflux esohpagitis, portal gastropathy, path with chronic gastritis, negative H.pylori, surveillance in June 2014  . Multiple extractions with alveoloplasty  03/08/2012    Procedure: MULTIPLE EXTRACION WITH ALVEOLOPLASTY;  Surgeon: Gae Bon, DDS;  Location: [facility];  Service: Oral Surgery;  Laterality: N/A;  . Esophagogastroduodenoscopy (egd) with propofol N/A 04/13/2014    Dr. [name]: grade 1-2 varices, hiatal hernia, portal gastropathy. NO need for further surveillance while on prophylaxis unless evidence of bleeding    . Social History  Substance Use Topics  . Smoking status: Current Every Day Smoker -- 0.50 packs/day for 20 years    Types: Cigarettes  . Smokeless tobacco: Never Used  .  Alcohol Use: No     Comment: last use 2015    ALLERGIES:  has No Known Allergies.  MEDICATIONS:  Current Outpatient Prescriptions  Medication Sig Dispense Refill  . acamprosate (CAMPRAL) 333 MG tablet Take 333 mg by mouth 3 (three) times daily with meals.     . folic acid (FOLVITE) 1 MG tablet Take 1 tablet (1 mg total) by mouth daily. 30 tablet 11  . furosemide (LASIX) 40 MG tablet Take 40 mg by mouth daily.     [name] glimepiride (AMARYL) 2 MG tablet Take 2 mg by mouth daily.     [name] HYDROcodone-acetaminophen (NORCO/VICODIN) 5-325 MG per tablet Take 1 tablet by mouth every 6 (six) hours as needed for moderate  pain.    [name] lisinopril-hydrochlorothiazide (PRINZIDE,ZESTORETIC) 10-12.5 MG per tablet Take 1 tablet by mouth daily.    [name] lubiprostone (AMITIZA) 24 MCG capsule Take 24 mcg by mouth 2 (two) times daily as needed for constipation.     . propranolol (INDERAL) 20 MG tablet Take 1 tablet (20 mg total) by mouth 2 (two) times daily. 60 tablet 3  . ranitidine (ZANTAC) 300 MG capsule Take 300 mg by mouth daily.    [name] spironolactone (ALDACTONE) 50 MG tablet Take 50 mg by mouth daily.    . phytonadione (VITAMIN K) 5 MG tablet Take 1 tablet (5 mg total) by mouth daily. 10 tablet 0   No current facility-administered medications for this visit.    PHYSICAL EXAMINATION: ECOG PERFORMANCE STATUS: 1 - Symptomatic but completely ambulatory  Filed Vitals:   09/11/15 0927  BP: 151/72  Pulse: 77  Temp: 98.5 F (36.9 C)  Resp: 18   Filed Weights   09/11/15 0927  Weight: 188 lb 1.6 oz (85.322 kg)   .Body mass index is 32.27 kg/(m^2).  GENERAL:alert, in no acute distress and comfortable SKIN: few healing bruises on her lower extremities EYES: normal, conjunctiva are pink and non-injected, sclera clear OROPHARYNX:no exudate, no erythema and lips, buccal mucosa, and tongue normal  NECK: supple, no JVD, thyroid normal size, non-tender, without nodularity LYMPH: no palpable lymphadenopathy in the cervical, axillary or inguinal LUNGS: clear to auscultation with normal respiratory effort HEART: regular rate & rhythm, no murmurs. ABDOMEN: abdomen soft, non-tender, normoactive bowel sounds, Palpable splenomegaly 2-3 fingerbreadths below costal margin in midclavicular line. Musculoskeletal: bilateral trace edema PSYCH: alert & oriented x 3 with fluent speech NEURO: left upper extremity 4/5 power  LABORATORY DATA:   I have reviewed the data as listed  . CBC Latest Ref Rng 09/11/2015 09/03/2015 08/29/2015  WBC 3.9 - 10.3 10e3/uL 2.7(L) 2.7(L) 3.6(L)  Hemoglobin 11.6 - 15.9 g/dL 10.7(L) 10.5(L) 11.1(L)    Hematocrit 34.8 - 46.6 % 31.5(L) 31.3(L) 32.4(L)  Platelets 145 - 400 10e3/uL 130(L) 45(L) 51(L)    . CMP Latest Ref Rng 08/29/2015 08/17/2015 07/29/2014  Glucose 70 - 140 mg/dl 100 110(H) 124(H)  BUN 7.0 - 26.0 mg/dL 8.7 11 15   Creatinine 0.6 - 1.1 mg/dL 1.2(H) 1.25(H) 1.92(H)  Sodium 136 - 145 mEq/L 137 135 136  Potassium 3.5 - 5.1 mEq/L 4.3 3.5 4.5  Chloride 101 - 111 mmol/L - 101 104  CO2 22 - 29 mEq/L 26 26 20   Calcium 8.4 - 10.4 mg/dL 10.1 9.1 8.9  Total Protein 6.4 - 8.3 g/dL 7.9 7.2 -  Total Bilirubin 0.20 - 1.20 mg/dL 2.03(H) 1.7(H) -  Alkaline Phos 40 - 150 U/L 114 117 -  AST 5 - 34 U/L 43(H) 48(H) -  ALT  0 - 55 U/L 27 28 -     RADIOGRAPHIC STUDIES: I have personally reviewed the radiological images as listed and agreed with the findings in the report. No results found.  ASSESSMENT & PLAN:   59 year old African American female with multiple medical comorbidities including hepatitis C, history of alcohol abuse, liver cirrhosis with portal hypertension referred to [place] for evaluation and management of  1] Thrombocytopenia this is likely related to patient's hepatitis C, hypersplenism due to splenomegaly and liver disease.cannot rule out an element of ITP. Baseline platelet counts recently appear to be around 40-50,000 these appear to have dropped further due to an intercurrent viral upper respiratory tract infection. 2] pancytopenia likely related to liver disease, hepatitis C and hypersplenism. Plan -Patient had excellent response to the Romiplostim with an increase in her platelet count from 45k to 130k with just 1 dose. -Patient was counseled to contact her surgeon regarding her C-spine surgery. -We will plan to continue weekly Romiplostim for now and for about 4-6 weeks after surgery to reduce the risk of perioperative bleeding. Dose will be adjusted to try to maintain platelet counts above 100,000 -patient again was counseled on complete smoking cessation to reduce the  risk of thrombosis with Romiplostim and to allow for better results with her C-spine surgery and to reduce other perioperative risks associated with smoking. -Note will be sent to her primary care physician and surgeon   Return to care in 2 weeks with Dr. Irene Limbo with CBC, CMP.  All of the patients questions were answered with apparent satisfaction. The patient knows to call the clinic with any problems, questions or concerns.   Sullivan Lone MD [place] AAHIVMS [facility] Hematology/Oncology Physician [facility]  (Office):       [phone] (Work cell):  [phone] (Fax):           [phone]

## 2015-09-18 ENCOUNTER — Other Ambulatory Visit (HOSPITAL_BASED_OUTPATIENT_CLINIC_OR_DEPARTMENT_OTHER): Payer: Medicaid Other

## 2015-09-18 ENCOUNTER — Ambulatory Visit (HOSPITAL_BASED_OUTPATIENT_CLINIC_OR_DEPARTMENT_OTHER): Payer: Medicaid Other

## 2015-09-18 VITALS — BP 160/76 | HR 79 | Temp 98.4°F

## 2015-09-18 DIAGNOSIS — D693 Immune thrombocytopenic purpura: Secondary | ICD-10-CM

## 2015-09-18 DIAGNOSIS — D61818 Other pancytopenia: Secondary | ICD-10-CM

## 2015-09-18 DIAGNOSIS — D696 Thrombocytopenia, unspecified: Secondary | ICD-10-CM

## 2015-09-18 LAB — CBC WITH DIFFERENTIAL/PLATELET
BASO%: 1.3 % (ref 0.0–2.0)
BASOS ABS: 0 10*3/uL (ref 0.0–0.1)
EOS ABS: 0.1 10*3/uL (ref 0.0–0.5)
EOS%: 5 % (ref 0.0–7.0)
HEMATOCRIT: 31.3 % — AB (ref 34.8–46.6)
HGB: 10.5 g/dL — ABNORMAL LOW (ref 11.6–15.9)
LYMPH#: 0.7 10*3/uL — AB (ref 0.9–3.3)
LYMPH%: 29.3 % (ref 14.0–49.7)
MCH: 34 pg (ref 25.1–34.0)
MCHC: 33.5 g/dL (ref 31.5–36.0)
MCV: 101.4 fL — ABNORMAL HIGH (ref 79.5–101.0)
MONO#: 0.3 10*3/uL (ref 0.1–0.9)
MONO%: 10.9 % (ref 0.0–14.0)
NEUT#: 1.3 10*3/uL — ABNORMAL LOW (ref 1.5–6.5)
NEUT%: 53.5 % (ref 38.4–76.8)
PLATELETS: 172 10*3/uL (ref 145–400)
RBC: 3.09 10*6/uL — ABNORMAL LOW (ref 3.70–5.45)
RDW: 13.5 % (ref 11.2–14.5)
WBC: 2.5 10*3/uL — ABNORMAL LOW (ref 3.9–10.3)

## 2015-09-18 MED ORDER — ROMIPLOSTIM 250 MCG ~~LOC~~ SOLR
2.0000 ug/kg | Freq: Once | SUBCUTANEOUS | Status: AC
  Administered 2015-09-18: 170 ug via SUBCUTANEOUS
  Filled 2015-09-18: qty 0.34

## 2015-09-24 ENCOUNTER — Encounter (HOSPITAL_COMMUNITY): Payer: Self-pay

## 2015-09-24 ENCOUNTER — Encounter (HOSPITAL_COMMUNITY)
Admission: RE | Admit: 2015-09-24 | Discharge: 2015-09-24 | Disposition: A | Discharge status: Home / Self Care | Payer: Medicaid Other | Source: Ambulatory Visit | Attending: Neurosurgery | Admitting: Neurosurgery

## 2015-09-24 DIAGNOSIS — M4802 Spinal stenosis, cervical region: Secondary | ICD-10-CM | POA: Diagnosis not present

## 2015-09-24 DIAGNOSIS — Z01818 Encounter for other preprocedural examination: Secondary | ICD-10-CM | POA: Diagnosis present

## 2015-09-24 HISTORY — DX: Disease of blood and blood-forming organs, unspecified: D75.9

## 2015-09-24 LAB — SURGICAL PCR SCREEN
MRSA, PCR: NEGATIVE
Staphylococcus aureus: NEGATIVE

## 2015-09-24 LAB — COMPREHENSIVE METABOLIC PANEL
ALK PHOS: 131 U/L — AB (ref 38–126)
ALT: 21 U/L (ref 14–54)
ANION GAP: 7 (ref 5–15)
AST: 40 U/L (ref 15–41)
Albumin: 3.7 g/dL (ref 3.5–5.0)
BUN: 13 mg/dL (ref 6–20)
CALCIUM: 9.5 mg/dL (ref 8.9–10.3)
CO2: 28 mmol/L (ref 22–32)
CREATININE: 1.3 mg/dL — AB (ref 0.44–1.00)
Chloride: 99 mmol/L — ABNORMAL LOW (ref 101–111)
GFR calc Af Amer: 52 mL/min — ABNORMAL LOW (ref >60)
GFR calc non Af Amer: 45 mL/min — ABNORMAL LOW (ref >60)
GLUCOSE: 117 mg/dL — AB (ref 65–99)
Potassium: 4.1 mmol/L (ref 3.5–5.1)
SODIUM: 134 mmol/L — AB (ref 135–145)
Total Bilirubin: 1.6 mg/dL — ABNORMAL HIGH (ref 0.3–1.2)
Total Protein: 7.2 g/dL (ref 6.5–8.1)

## 2015-09-24 LAB — TYPE AND SCREEN
ABO/RH(D): O NEG
Antibody Screen: NEGATIVE

## 2015-09-24 LAB — CBC
HCT: 33.1 % — ABNORMAL LOW (ref 36.0–46.0)
Hemoglobin: 11 g/dL — ABNORMAL LOW (ref 12.0–15.0)
MCH: 33.2 pg (ref 26.0–34.0)
MCHC: 33.2 g/dL (ref 30.0–36.0)
MCV: 100 fL (ref 78.0–100.0)
Platelets: 160 10*3/uL (ref 150–400)
RBC: 3.31 MIL/uL — ABNORMAL LOW (ref 3.87–5.11)
RDW: 13.5 % (ref 11.5–15.5)
WBC: 2.7 10*3/uL — ABNORMAL LOW (ref 4.0–10.5)

## 2015-09-24 LAB — GLUCOSE, CAPILLARY: GLUCOSE-CAPILLARY: 99 mg/dL (ref 65–99)

## 2015-09-24 NOTE — Progress Notes (Signed)
Anesthesia Chart Review:  Pt is 59 year old female scheduled for C3-4 ACDF on 09/28/2015 with Dr. Joya Salm.   Pt was originally scheduled for this surgery in August, but it was postponed when she was found to have thrombocytopenia.   PMH includes: HTN, DM, CKD, pancytopenia, hepatitis C, hepatitis B, history of alcohol abuse, cirrhosis, esophageal varices, GERD. Current smoker. S/p EGD 04/13/14. S/p multiple dental extractions 03/08/12.   Medications include: lasix, glimepiride, lisinopril-hctz, propranolol, zantac, romiplostim (weekly to boost platelets), spironolactone.   Preoperative labs reviewed.  Platelets are now 160. PT, PTT and fibrinogen ordered by hem-onc on 08/29/15 were normal.   Chest x-ray 08/17/2015 reviewed. No active disease.   EKG 08/17/2015: Sinus bradycardia (56 bpm). Septal infarct, age undetermined. Appears unchanged when compared to tracings dating back to 12/11/2011.  Pt saw hematologist Dr. Irene Limbo [facility]/7 and 09/17/15. Thrombocytopenia thought to be related to liver disease. Pt started on romiplostim, platelets have normalize, and pt will continue to be followed by hem-onc post-op.   If no changes, I anticipate pt can proceed with surgery as scheduled.   Willeen Cass, FNP-BC [facility]/Anesthesiology Phone: [phone] 09/24/2015 4:57 PM

## 2015-09-24 NOTE — Pre-Procedure Instructions (Signed)
Gabriella Leon  09/24/2015      [facility] - [place], [place] [address] Phone: [phone] Fax: [phone]  [place], [place] [address] [place] [address] Phone: [phone] Fax: [phone]    Your procedure is scheduled on 09/28/15.  Report to [facility] short stay admitting at 600 A.M.  Call this number if you have problems the morning of surgery:  [phone]   Remember:  Do not eat food or drink liquids after midnight.  Take these medicines the morning of surgery with A SIP OF WATER pain med if needed, propanolol,zantac,    STOP all herbel meds, nsaids (aleve,naproxen,advil,ibuprofen) starting today including aspirin ,folic acid No glimepiride am of surgery     How to Manage Your Diabetes Before Surgery   Why is it important to control my blood sugar before and after surgery?   Improving blood sugar levels before and after surgery helps healing and can limit problems.  A way of improving blood sugar control is eating a healthy diet by:  - Eating less sugar and carbohydrates  - Increasing activity/exercise  - Talk with your doctor about reaching your blood sugar goals  High blood sugars (greater than 180 mg/dL) can raise your risk of infections and slow down your recovery so you will need to focus on controlling your diabetes during the weeks before surgery.  Make sure that the doctor who takes care of your diabetes knows about your planned surgery including the date and location.  How do I manage my blood sugars before surgery?   Check your blood sugar at least 4 times a day, 2 days before surgery to make sure that they are not too high or low.   Check your blood sugar the morning of your surgery when you wake up and every 2               hours until you get to the Short-Stay unit.  If your blood sugar is less than 70 mg/dL, you  will need to treat for low blood sugar by:  Treat a low blood sugar (less than 70 mg/dL) with 1/2 cup of clear juice (cranberry or apple), 4 glucose tablets, OR glucose gel.  Recheck blood sugar in 15 minutes after treatment (to make sure it is greater than 70 mg/dL).  If blood sugar is not greater than 70 mg/dL on re-check, call [phone] for further instructions.   Report your blood sugar to the Short-Stay nurse when you get to Short-Stay.  References:  [facility], 2007 "How to Manage your Diabetes Before and After Surgery".  What do I do about my diabetes medications?   Morning of surgery no glimepiride     Do not wear jewelry, make-up or nail polish.  Do not wear lotions, powders, or perfumes.  You may wear deodorant.  Do not shave 48 hours prior to surgery.  Men may shave face and neck.  Do not bring valuables to the hospital.  [facility] is not responsible for any belongings or valuables.  Contacts, dentures or bridgework may not be worn into surgery.  Leave your suitcase in the car.  After surgery it may be brought to your room.  For patients admitted to the hospital, discharge time will be determined by your treatment team.  Patients discharged the day of surgery will not be allowed  to drive home.   Name and phone number of your driver:    Special instructions:   Special Instructions: [place] - Preparing for Surgery  Before surgery, you can play an important role.  Because skin is not sterile, your skin needs to be as free of germs as possible.  You can reduce the number of germs on you skin by washing with CHG (chlorahexidine gluconate) soap before surgery.  CHG is an antiseptic cleaner which kills germs and bonds with the skin to continue killing germs even after washing.  Please DO NOT use if you have an allergy to CHG or antibacterial soaps.  If your skin becomes reddened/irritated stop using the CHG and inform your nurse when you  arrive at Short Stay.  Do not shave (including legs and underarms) for at least 48 hours prior to the first CHG shower.  You may shave your face.  Please follow these instructions carefully:   1.  Shower with CHG Soap the night before surgery and the morning of Surgery.  2.  If you choose to wash your hair, wash your hair first as usual with your normal shampoo.  3.  After you shampoo, rinse your hair and body thoroughly to remove the Shampoo.  4.  Use CHG as you would any other liquid soap.  You can apply chg directly  to the skin and wash gently with scrungie or a clean washcloth.  5.  Apply the CHG Soap to your body ONLY FROM THE NECK DOWN.  Do not use on open wounds or open sores.  Avoid contact with your eyes ears, mouth and genitals (private parts).  Wash genitals (private parts)       with your normal soap.  6.  Wash thoroughly, paying special attention to the area where your surgery will be performed.  7.  Thoroughly rinse your body with warm water from the neck down.  8.  DO NOT shower/wash with your normal soap after using and rinsing off the CHG Soap.  9.  Pat yourself dry with a clean towel.            10.  Wear clean pajamas.            11.  Place clean sheets on your bed the night of your first shower and do not sleep with pets.  Day of Surgery  Do not apply any lotions/deodorants the morning of surgery.  Please wear clean clothes to the hospital/surgery center.  Please read over the following fact sheets that you were given. Coughing and Deep Breathing, Blood Transfusion Information, MRSA Information and Surgical Site Infection Prevention

## 2015-09-24 NOTE — Progress Notes (Signed)
req'd office notes from renal dr- dr [facility]sk[facility] [phone] [place] and [phone] [facility]

## 2015-09-25 ENCOUNTER — Telehealth: Payer: Self-pay | Admitting: Hematology

## 2015-09-25 ENCOUNTER — Other Ambulatory Visit (HOSPITAL_BASED_OUTPATIENT_CLINIC_OR_DEPARTMENT_OTHER): Payer: Medicaid Other

## 2015-09-25 ENCOUNTER — Ambulatory Visit (HOSPITAL_BASED_OUTPATIENT_CLINIC_OR_DEPARTMENT_OTHER): Payer: Medicaid Other

## 2015-09-25 ENCOUNTER — Ambulatory Visit (HOSPITAL_BASED_OUTPATIENT_CLINIC_OR_DEPARTMENT_OTHER): Payer: Medicaid Other | Admitting: Hematology

## 2015-09-25 VITALS — BP 117/60 | HR 93 | Temp 98.4°F | Resp 18 | Ht 64.0 in | Wt 187.3 lb

## 2015-09-25 DIAGNOSIS — D693 Immune thrombocytopenic purpura: Secondary | ICD-10-CM

## 2015-09-25 DIAGNOSIS — D61818 Other pancytopenia: Secondary | ICD-10-CM | POA: Diagnosis not present

## 2015-09-25 DIAGNOSIS — B1920 Unspecified viral hepatitis C without hepatic coma: Secondary | ICD-10-CM | POA: Diagnosis not present

## 2015-09-25 DIAGNOSIS — R161 Splenomegaly, not elsewhere classified: Secondary | ICD-10-CM | POA: Diagnosis not present

## 2015-09-25 DIAGNOSIS — D696 Thrombocytopenia, unspecified: Secondary | ICD-10-CM

## 2015-09-25 DIAGNOSIS — J069 Acute upper respiratory infection, unspecified: Secondary | ICD-10-CM

## 2015-09-25 DIAGNOSIS — K769 Liver disease, unspecified: Secondary | ICD-10-CM

## 2015-09-25 DIAGNOSIS — D731 Hypersplenism: Secondary | ICD-10-CM

## 2015-09-25 LAB — HEMOGLOBIN A1C
Hgb A1c MFr Bld: 4.9 % (ref 4.8–5.6)
MEAN PLASMA GLUCOSE: 94 mg/dL

## 2015-09-25 LAB — CBC WITH DIFFERENTIAL/PLATELET
BASO%: 0.8 % (ref 0.0–2.0)
Basophils Absolute: 0 10*3/uL (ref 0.0–0.1)
EOS ABS: 0.2 10*3/uL (ref 0.0–0.5)
EOS%: 5.7 % (ref 0.0–7.0)
HEMATOCRIT: 31.8 % — AB (ref 34.8–46.6)
HGB: 10.9 g/dL — ABNORMAL LOW (ref 11.6–15.9)
LYMPH#: 0.7 10*3/uL — AB (ref 0.9–3.3)
LYMPH%: 26.8 % (ref 14.0–49.7)
MCH: 34.6 pg — ABNORMAL HIGH (ref 25.1–34.0)
MCHC: 34.4 g/dL (ref 31.5–36.0)
MCV: 100.6 fL (ref 79.5–101.0)
MONO#: 0.3 10*3/uL (ref 0.1–0.9)
MONO%: 12.6 % (ref 0.0–14.0)
NEUT%: 54.1 % (ref 38.4–76.8)
NEUTROS ABS: 1.5 10*3/uL (ref 1.5–6.5)
PLATELETS: 145 10*3/uL (ref 145–400)
RBC: 3.16 10*6/uL — ABNORMAL LOW (ref 3.70–5.45)
RDW: 13.9 % (ref 11.2–14.5)
WBC: 2.8 10*3/uL — AB (ref 3.9–10.3)

## 2015-09-25 MED ORDER — ROMIPLOSTIM 250 MCG ~~LOC~~ SOLR
2.0000 ug/kg | Freq: Once | SUBCUTANEOUS | Status: AC
  Administered 2015-09-25: 170 ug via SUBCUTANEOUS
  Filled 2015-09-25: qty 0.34

## 2015-09-25 NOTE — Patient Instructions (Signed)
Romiplostim injection What is this medicine? ROMIPLOSTIM (roe mi PLOE stim) helps your body make more platelets. This medicine is used to treat low platelets caused by chronic idiopathic thrombocytopenic purpura (ITP). This medicine may be used for other purposes; ask your health care provider or pharmacist if you have questions. COMMON BRAND NAME(S): Nplate What should I tell my health care provider before I take this medicine? They need to know if you have any of these conditions: -cancer or myelodysplastic syndrome -low blood counts, like low white cell, platelet, or red cell counts -take medicines that treat or prevent blood clots -an unusual or allergic reaction to romiplostim, mannitol, other medicines, foods, dyes, or preservatives -pregnant or trying to get pregnant -breast-feeding How should I use this medicine? This medicine is for injection under the skin. It is given by a health care professional in a hospital or clinic setting. A special MedGuide will be given to you before your injection. Read this information carefully each time. Talk to your pediatrician regarding the use of this medicine in children. Special care may be needed. Overdosage: If you think you have taken too much of this medicine contact a poison control center or emergency room at once. NOTE: This medicine is only for you. Do not share this medicine with others. What if I miss a dose? It is important not to miss your dose. Call your doctor or health care professional if you are unable to keep an appointment. What may interact with this medicine? Interactions are not expected. This list may not describe all possible interactions. Give your health care provider a list of all the medicines, herbs, non-prescription drugs, or dietary supplements you use. Also tell them if you smoke, drink alcohol, or use illegal drugs. Some items may interact with your medicine. What should I watch for while using this  medicine? Your condition will be monitored carefully while you are receiving this medicine. Visit your prescriber or health care professional for regular checks on your progress and for the needed blood tests. It is important to keep all appointments. What side effects may I notice from receiving this medicine? Side effects that you should report to your doctor or health care professional as soon as possible: -allergic reactions like skin rash, itching or hives, swelling of the face, lips, or tongue -shortness of breath, chest pain, swelling in a leg -unusual bleeding or bruising Side effects that usually do not require medical attention (report to your doctor or health care professional if they continue or are bothersome): -dizziness -headache -muscle aches -pain in arms and legs -stomach pain -trouble sleeping This list may not describe all possible side effects. Call your doctor for medical advice about side effects. You may report side effects to FDA at 1-800-FDA-1088. Where should I keep my medicine? This drug is given in a hospital or clinic and will not be stored at home. NOTE: This sheet is a summary. It may not cover all possible information. If you have questions about this medicine, talk to your doctor, pharmacist, or health care provider.  2015, [employer]vier/[employer] Standard. (2008-08-07 15:13:04)  

## 2015-09-25 NOTE — Telephone Encounter (Signed)
per pof to sch pt appt-cld pt 7 left a message of time & date of apppt

## 2015-09-26 ENCOUNTER — Encounter: Payer: Self-pay | Admitting: Hematology

## 2015-09-26 NOTE — Progress Notes (Signed)
[name]  HEMATOLOGY ONCOLOGY PROGRESS NOTE  Date of service: 09/25/2015  Patient Care Team: Neale Burly, MD as PCP - General (Internal Medicine) Daneil Dolin, MD (Gastroenterology)  CC:  follow-up for thrombocytopenia   DIAGNOSIS: Thrombocytopenia this is likely related to patient's hepatitis C, hypersplenism due to splenomegaly and liver disease.cannot rule out an element of ITP.   CURRENT TREATMENT  Romiplostim 56mcg/kg to target platelet counts of 100,000 perioperatively for cervical spinal surgery for cervical myelopathy causing left upper extremity weakness.  Intend to treat preoperatively and then for at least 4 weeks postoperatively after which it could probably be held.  INTERVAL HISTORY:  Ms Guilford Shi is here for her scheduled follow-up for thrombo-cytopenia evaluation. She notes no acute new concerns. No issues with bleeding. PLT counts have completely normalized with Nplate. I did inform Dr Joya Salm regarding the status of thrombocytopenia. Patient notes that she has been scheduled for the c-spine surgery on 09/28/2015 at [facility]. Patient notes no other acute questions or concerns.  REVIEW OF SYSTEMS:    10 Point review of systems of done and is negative except as noted above.  . Past Medical History  Diagnosis Date  . Diabetes mellitus   . Hypertension   . Cirrhosis ([place])   . Hepatitis B antibody positive   . Ulcer     ?  [name] Acid reflux disease with ulcer   . Depression   . Helicobacter pylori ab+     July 2010, s/p treatment  . GERD (gastroesophageal reflux disease)   . Gallstones   . Anxiety   . Blood transfusion   . Hepatitis C     and B positive antibody  . Other pancytopenia ([place]) 10/27/2013  . Folate deficiency 03/28/2014    Noted on 03/23/2014.  Folate 1 mg prescribed.  . History of alcoholism ([place])   . Arthritis   . Liver cirrhosis ([place])   . DDD (degenerative disc disease), lumbar   . Esophageal varices ([place]) 09/18/2014  . Chronic  kidney disease   . Headache     migraines  . Blood dyscrasia     thrombocytopenia-next tx injection 09/25/15    . Past Surgical History  Procedure Laterality Date  . Cesarean section  1988    [facility]  . Cholecystectomy  greater than 10 yrs    [facility]  . Appendectomy      age 37  . Esophageal biopsy  12/18/2011       . Colonoscopy  12/18/11    minimal anal canal hemorrhoids, friable rectal and colonic mucosa, left-sided diverticulosis, repeat in 2022.   [name] Esophagogastroduodenoscopy  12/18/11    3 columns of Grade II esophageal varices, reflux esohpagitis, portal gastropathy, path with chronic gastritis, negative H.pylori, surveillance in June 2014  . Multiple extractions with alveoloplasty  03/08/2012    Procedure: MULTIPLE EXTRACION WITH ALVEOLOPLASTY;  Surgeon: Gae Bon, DDS;  Location: [place];  Service: Oral Surgery;  Laterality: N/A;  . Esophagogastroduodenoscopy (egd) with propofol N/A 04/13/2014    Dr. [name]: grade 1-2 varices, hiatal hernia, portal gastropathy. NO need for further surveillance while on prophylaxis unless evidence of bleeding    . Social History  Substance Use Topics  . Smoking status: Current Every Day Smoker -- 0.50 packs/day for 20 years    Types: Cigarettes  . Smokeless tobacco: Never Used  . Alcohol Use: No     Comment: last use 2015    ALLERGIES:  has No Known Allergies.  MEDICATIONS:  Current Outpatient Prescriptions  Medication Sig Dispense Refill  . folic acid (FOLVITE) 1 MG tablet Take 1 tablet (1 mg total) by mouth daily. 30 tablet 11  . furosemide (LASIX) 40 MG tablet Take 40 mg by mouth daily.     [name] glimepiride (AMARYL) 2 MG tablet Take 2 mg by mouth daily.     [name] HYDROcodone-acetaminophen (NORCO/VICODIN) 5-325 MG per tablet Take 1 tablet by mouth every 6 (six) hours as needed for moderate pain.    [name] ibuprofen (ADVIL,MOTRIN) 200 MG tablet Take 400 mg by mouth every 6 (six) hours as needed (pain).    [name] lisinopril-hydrochlorothiazide  (PRINZIDE,ZESTORETIC) 10-12.5 MG per tablet Take 1 tablet by mouth daily.    [name] lubiprostone (AMITIZA) 24 MCG capsule Take 24 mcg by mouth 2 (two) times daily as needed for constipation.     . phytonadione (VITAMIN K) 5 MG tablet Take 1 tablet (5 mg total) by mouth daily. (Patient not taking: Reported on 09/24/2015) 10 tablet 0  . propranolol (INDERAL) 20 MG tablet Take 1 tablet (20 mg total) by mouth 2 (two) times daily. 60 tablet 3  . ranitidine (ZANTAC) 300 MG capsule Take 300 mg by mouth daily.    [name] ROMIPLOSTIM [place] Inject 1 each into the skin once a week. Injections are done at Dr. Grier Mitts office, [name] every Tuesday - next injection due 09/25/15 (21mcg/kg)    . sertraline (ZOLOFT) 25 MG tablet Take 25 mg by mouth daily.    [name] spironolactone (ALDACTONE) 50 MG tablet Take 50 mg by mouth daily.     No current facility-administered medications for this visit.    PHYSICAL EXAMINATION: ECOG PERFORMANCE STATUS: 1 - Symptomatic but completely ambulatory  Filed Vitals:   09/25/15 0937  BP: 117/60  Pulse: 93  Temp: 98.4 F (36.9 C)  Resp: 18   Filed Weights   09/25/15 0937  Weight: 187 lb 4.8 oz (84.959 kg)   .Body mass index is 32.13 kg/(m^2).  GENERAL:alert, in no acute distress and comfortable SKIN: few healing bruises on her lower extremities EYES: normal, conjunctiva are pink and non-injected, sclera clear OROPHARYNX:no exudate, no erythema and lips, buccal mucosa, and tongue normal  NECK: supple, no JVD, thyroid normal size, non-tender, without nodularity LYMPH: no palpable lymphadenopathy in the cervical, axillary or inguinal LUNGS: clear to auscultation with normal respiratory effort HEART: regular rate & rhythm, no murmurs. ABDOMEN: abdomen soft, non-tender, normoactive bowel sounds, Palpable splenomegaly 2-3 fingerbreadths below costal margin in midclavicular line. Musculoskeletal: bilateral trace edema PSYCH: alert & oriented x 3 with fluent speech NEURO:  left upper extremity 4/5 power  LABORATORY DATA:   I have reviewed the data as listed  . CBC Latest Ref Rng 09/25/2015 09/24/2015 09/18/2015  WBC 3.9 - 10.3 10e3/uL 2.8(L) 2.7(L) 2.5(L)  Hemoglobin 11.6 - 15.9 g/dL 10.9(L) 11.0(L) 10.5(L)  Hematocrit 34.8 - 46.6 % 31.8(L) 33.1(L) 31.3(L)  Platelets 145 - 400 10e3/uL 145 160 172    . CMP Latest Ref Rng 09/24/2015 08/29/2015 08/17/2015  Glucose 65 - 99 mg/dL 117(H) 100 110(H)  BUN 6 - 20 mg/dL 13 8.7 11  Creatinine 0.44 - 1.00 mg/dL 1.30(H) 1.2(H) 1.25(H)  Sodium 135 - 145 mmol/L 134(L) 137 135  Potassium 3.5 - 5.1 mmol/L 4.1 4.3 3.5  Chloride 101 - 111 mmol/L 99(L) - 101  CO2 22 - 32 mmol/L 28 26 26   Calcium 8.9 - 10.3 mg/dL 9.5 10.1 9.1  Total Protein 6.5 - 8.1 g/dL 7.2 7.9 7.2  Total Bilirubin 0.3 - 1.2 mg/dL 1.6(H) 2.03(H) 1.7(H)  Alkaline Phos 38 - 126 U/L 131(H) 114 117  AST 15 - 41 U/L 40 43(H) 48(H)  ALT 14 - 54 U/L 21 27 28      RADIOGRAPHIC STUDIES: I have personally reviewed the radiological images as listed and agreed with the findings in the report. No results found.  ASSESSMENT & PLAN:   59 year old African American female with multiple medical comorbidities including hepatitis C, history of alcohol abuse, liver cirrhosis with portal hypertension referred to [place] for evaluation and management of  1] Thrombocytopenia this is likely related to patient's hepatitis C, hypersplenism due to splenomegaly and liver disease.cannot rule out an element of ITP. Baseline platelet counts recently appear to be around 40-50,000 these appear to have dropped further due to an intercurrent viral upper respiratory tract infection. 2] pancytopenia likely related to liver disease, hepatitis C and hypersplenism. Plan -Patient had excellent response to the Romiplostim with an increase in her platelet count to normal range. -the patient is okay to proceed for her necessary C-spine surgery. -We will plan to continue weekly Romiplostim for now  and for about 4-6 weeks after surgery to reduce the risk of perioperative bleeding. Dose will be adjusted to try to maintain platelet counts above 100,000 -patient again was counseled on complete smoking cessation to reduce the risk of thrombosis with Romiplostim and to allow for better results with her C-spine surgery and to reduce other perioperative risks associated with smoking. -all the patient's questions and concerns were answered. -.  The patient remains hospitalized she might need to reschedule her next dose of Romiplostim or alternatively receive 1 dose in the hospital.  Return to care in 2 weeks with Dr. Irene Limbo with CBC, CMP.  All of the patients questions were answered to her apparent satisfaction. The patient knows to call the clinic with any problems, questions or concerns.   Sullivan Lone MD [place] AAHIVMS [facility] Hematology/Oncology Physician [facility]  (Office):       [phone] (Work cell):  [phone] (Fax):           [phone]

## 2015-09-27 MED ORDER — CEFAZOLIN SODIUM-DEXTROSE 2-3 GM-% IV SOLR
2.0000 g | INTRAVENOUS | Status: AC
  Administered 2015-09-28: 2 g via INTRAVENOUS
  Filled 2015-09-27: qty 50

## 2015-09-27 NOTE — H&P (Signed)
Gabriella Leon is an 59 y.o. female.   Chief Complaint: weakness HPI: patient who was seen by me bacause of  weaknes and difficulties with her balance. Mri showed a large nerniated disc at c32-4. She was schedule for surgery but because of her low plateles count it was cancelled. She is no better and surgery was schedule   Past Medical History  Diagnosis Date  . Diabetes mellitus   . Hypertension   . Cirrhosis ([place])   . Hepatitis B antibody positive   . Ulcer     ?  [name] Acid reflux disease with ulcer   . Depression   . Helicobacter pylori ab+     July 2010, s/p treatment  . GERD (gastroesophageal reflux disease)   . Gallstones   . Anxiety   . Blood transfusion   . Hepatitis C     and B positive antibody  . Other pancytopenia ([place]) 10/27/2013  . Folate deficiency 03/28/2014    Noted on 03/23/2014.  Folate 1 mg prescribed.  . History of alcoholism ([place])   . Arthritis   . Liver cirrhosis ([place])   . DDD (degenerative disc disease), lumbar   . Esophageal varices ([place]) 09/18/2014  . Chronic kidney disease   . Headache     migraines  . Blood dyscrasia     thrombocytopenia-next tx injection 09/25/15    Past Surgical History  Procedure Laterality Date  . Cesarean section  1988    [facility]  . Cholecystectomy  greater than 10 yrs    [facility]  . Appendectomy      age 68  . Esophageal biopsy  12/18/2011       . Colonoscopy  12/18/11    minimal anal canal hemorrhoids, friable rectal and colonic mucosa, left-sided diverticulosis, repeat in 2022.   [name] Esophagogastroduodenoscopy  12/18/11    3 columns of Grade II esophageal varices, reflux esohpagitis, portal gastropathy, path with chronic gastritis, negative H.pylori, surveillance in June 2014  . Multiple extractions with alveoloplasty  03/08/2012    Procedure: MULTIPLE EXTRACION WITH ALVEOLOPLASTY;  Surgeon: Gae Bon, DDS;  Location: [facility];  Service: Oral Surgery;  Laterality: N/A;  . Esophagogastroduodenoscopy (egd) with propofol  N/A 04/13/2014    Dr. [name]: grade 1-2 varices, hiatal hernia, portal gastropathy. NO need for further surveillance while on prophylaxis unless evidence of bleeding    Family History  Problem Relation Age of Onset  . Diabetes Mother   . Cancer Paternal Uncle     passed away age 48  . Anesthesia problems Neg Hx   . Hypotension Neg Hx   . Malignant hyperthermia Neg Hx   . Pseudochol deficiency Neg Hx    Social History:  reports that she has been smoking Cigarettes.  She has a 10 pack-year smoking history. She has never used smokeless tobacco. She reports that she does not drink alcohol or use illicit drugs.  Allergies: No Known Allergies  No prescriptions prior to admission    No results found for this or any previous visit (from the past 48 hour(s)). No results found.  Review of Systems  Eyes: Negative.   Respiratory: Negative.   Cardiovascular: Negative.   Gastrointestinal: Negative.   Genitourinary: Negative.   Musculoskeletal: Positive for neck pain.  Skin: Negative.   Neurological: Positive for sensory change and focal weakness.  Endo/Heme/Allergies:       Diabetes, liver disease   Psychiatric/Behavioral: Positive for depression.    There were no vitals taken for this visit. Physical  Exam  Hent, nl. Neck, pain with movement. Cv,nl. Lungs, clear. Abdomen, soft. Extremities, nl. NEURO WEAKNESS OF LEFT DELTOID AND BICEPS. CAN NOT WALK IN A STRAIGHT LINE. DTR 3 plus , no babinski. Mri showed stenosis at c3-4 with changes in the corg Assessment/Plan Patient to go ahead wuth decompression and fusion at c3-4. She is aware of risks such as paralysis, csf leak, infection also need for further surgery with a posterior approach. The most risk is the possibility of bleeding secondary to her liver disease  Manisha Cancel M 09/27/2015, 5:04 PM

## 2015-09-28 ENCOUNTER — Encounter (HOSPITAL_COMMUNITY): Payer: Self-pay | Admitting: *Deleted

## 2015-09-28 ENCOUNTER — Inpatient Hospital Stay (HOSPITAL_COMMUNITY)
Admission: RE | Admit: 2015-09-28 | Discharge: 2015-10-01 | DRG: 473 | Disposition: A | Discharge status: Home / Self Care | Payer: Medicaid Other | Source: Ambulatory Visit | Attending: Neurosurgery | Admitting: Neurosurgery

## 2015-09-28 ENCOUNTER — Ambulatory Visit (HOSPITAL_COMMUNITY): Payer: Medicaid Other | Admitting: Emergency Medicine

## 2015-09-28 ENCOUNTER — Ambulatory Visit (HOSPITAL_COMMUNITY): Payer: Medicaid Other | Admitting: Anesthesiology

## 2015-09-28 ENCOUNTER — Encounter (HOSPITAL_COMMUNITY)
Admission: RE | Disposition: A | Discharge status: Home / Self Care | Payer: Self-pay | Source: Ambulatory Visit | Attending: Neurosurgery

## 2015-09-28 ENCOUNTER — Ambulatory Visit (HOSPITAL_COMMUNITY): Payer: Medicaid Other

## 2015-09-28 DIAGNOSIS — F17210 Nicotine dependence, cigarettes, uncomplicated: Secondary | ICD-10-CM | POA: Diagnosis present

## 2015-09-28 DIAGNOSIS — K7460 Unspecified cirrhosis of liver: Secondary | ICD-10-CM | POA: Diagnosis present

## 2015-09-28 DIAGNOSIS — N189 Chronic kidney disease, unspecified: Secondary | ICD-10-CM | POA: Diagnosis present

## 2015-09-28 DIAGNOSIS — E1122 Type 2 diabetes mellitus with diabetic chronic kidney disease: Secondary | ICD-10-CM | POA: Diagnosis not present

## 2015-09-28 DIAGNOSIS — I129 Hypertensive chronic kidney disease with stage 1 through stage 4 chronic kidney disease, or unspecified chronic kidney disease: Secondary | ICD-10-CM | POA: Diagnosis not present

## 2015-09-28 DIAGNOSIS — Z419 Encounter for procedure for purposes other than remedying health state, unspecified: Secondary | ICD-10-CM

## 2015-09-28 DIAGNOSIS — M1990 Unspecified osteoarthritis, unspecified site: Secondary | ICD-10-CM | POA: Diagnosis present

## 2015-09-28 DIAGNOSIS — K219 Gastro-esophageal reflux disease without esophagitis: Secondary | ICD-10-CM | POA: Diagnosis present

## 2015-09-28 DIAGNOSIS — M4802 Spinal stenosis, cervical region: Principal | ICD-10-CM | POA: Diagnosis present

## 2015-09-28 DIAGNOSIS — R531 Weakness: Secondary | ICD-10-CM | POA: Diagnosis present

## 2015-09-28 HISTORY — PX: ANTERIOR CERVICAL DECOMP/DISCECTOMY FUSION: SHX1161

## 2015-09-28 LAB — GLUCOSE, CAPILLARY
GLUCOSE-CAPILLARY: 136 mg/dL — AB (ref 65–99)
GLUCOSE-CAPILLARY: 170 mg/dL — AB (ref 65–99)

## 2015-09-28 SURGERY — ANTERIOR CERVICAL DECOMPRESSION/DISCECTOMY FUSION 1 LEVEL
Anesthesia: General | Site: Neck

## 2015-09-28 MED ORDER — LISINOPRIL 10 MG PO TABS
10.0000 mg | ORAL_TABLET | Freq: Every day | ORAL | Status: DC
Start: 2015-09-28 — End: 2015-10-01
  Administered 2015-09-29 – 2015-09-30 (×2): 10 mg via ORAL
  Filled 2015-09-28 (×3): qty 1

## 2015-09-28 MED ORDER — SODIUM CHLORIDE 0.9 % IV SOLN
INTRAVENOUS | Status: DC
  Administered 2015-09-28: 15:00:00 via INTRAVENOUS

## 2015-09-28 MED ORDER — DIAZEPAM 5 MG PO TABS
5.0000 mg | ORAL_TABLET | Freq: Four times a day (QID) | ORAL | Status: DC | PRN
  Administered 2015-09-28 – 2015-09-30 (×3): 5 mg via ORAL
  Filled 2015-09-28 (×3): qty 1

## 2015-09-28 MED ORDER — 0.9 % SODIUM CHLORIDE (POUR BTL) OPTIME
TOPICAL | Status: DC | PRN
  Administered 2015-09-28: 1000 mL

## 2015-09-28 MED ORDER — HYDROCHLOROTHIAZIDE 12.5 MG PO CAPS
12.5000 mg | ORAL_CAPSULE | Freq: Every day | ORAL | Status: DC
  Administered 2015-09-29 – 2015-09-30 (×2): 12.5 mg via ORAL
  Filled 2015-09-28 (×3): qty 1

## 2015-09-28 MED ORDER — DEXAMETHASONE SODIUM PHOSPHATE 4 MG/ML IJ SOLN
4.0000 mg | Freq: Four times a day (QID) | INTRAMUSCULAR | Status: DC
  Administered 2015-09-28: 4 mg via INTRAVENOUS
  Filled 2015-09-28: qty 1

## 2015-09-28 MED ORDER — LIDOCAINE HCL (CARDIAC) 20 MG/ML IV SOLN
INTRAVENOUS | Status: DC | PRN
  Administered 2015-09-28: 40 mg via INTRAVENOUS
  Administered 2015-09-28: 60 mg via INTRATRACHEAL

## 2015-09-28 MED ORDER — ACETAMINOPHEN 650 MG RE SUPP
650.0000 mg | RECTAL | Status: DC | PRN
Start: 2015-09-28 — End: 2015-10-01

## 2015-09-28 MED ORDER — NEOSTIGMINE METHYLSULFATE 10 MG/10ML IV SOLN
INTRAVENOUS | Status: AC
  Filled 2015-09-28: qty 1

## 2015-09-28 MED ORDER — SODIUM CHLORIDE 0.9 % IJ SOLN: 3.0000 mL | INTRAMUSCULAR | Status: DC | PRN

## 2015-09-28 MED ORDER — FENTANYL CITRATE (PF) 100 MCG/2ML IJ SOLN
INTRAMUSCULAR | Status: DC | PRN
  Administered 2015-09-28: 150 ug via INTRAVENOUS
  Administered 2015-09-28: 100 ug via INTRAVENOUS

## 2015-09-28 MED ORDER — ONDANSETRON HCL 4 MG/2ML IJ SOLN
INTRAMUSCULAR | Status: AC
  Filled 2015-09-28: qty 2

## 2015-09-28 MED ORDER — ALBUMIN HUMAN 5 % IV SOLN
INTRAVENOUS | Status: DC | PRN
  Administered 2015-09-28: 10:00:00 via INTRAVENOUS

## 2015-09-28 MED ORDER — DEXAMETHASONE 4 MG PO TABS
4.0000 mg | ORAL_TABLET | Freq: Four times a day (QID) | ORAL | Status: DC
Start: 2015-09-28 — End: 2015-10-01
  Administered 2015-09-28 – 2015-10-01 (×10): 4 mg via ORAL
  Filled 2015-09-28 (×10): qty 1

## 2015-09-28 MED ORDER — MENTHOL 3 MG MT LOZG: 1.0000 | LOZENGE | OROMUCOSAL | Status: DC | PRN

## 2015-09-28 MED ORDER — GLYCOPYRROLATE 0.2 MG/ML IJ SOLN
INTRAMUSCULAR | Status: AC
  Filled 2015-09-28: qty 1

## 2015-09-28 MED ORDER — GLIMEPIRIDE 2 MG PO TABS
2.0000 mg | ORAL_TABLET | Freq: Every day | ORAL | Status: DC
  Administered 2015-09-28 – 2015-09-30 (×3): 2 mg via ORAL
  Filled 2015-09-28 (×4): qty 1

## 2015-09-28 MED ORDER — THROMBIN 5000 UNITS EX SOLR
CUTANEOUS | Status: DC | PRN
  Administered 2015-09-28 (×2): 5000 [IU] via TOPICAL

## 2015-09-28 MED ORDER — GLYCOPYRROLATE 0.2 MG/ML IJ SOLN
INTRAMUSCULAR | Status: AC
  Filled 2015-09-28: qty 2

## 2015-09-28 MED ORDER — HYDROMORPHONE HCL 1 MG/ML IJ SOLN
INTRAMUSCULAR | Status: AC
  Filled 2015-09-28: qty 1

## 2015-09-28 MED ORDER — NEOSTIGMINE METHYLSULFATE 10 MG/10ML IV SOLN
INTRAVENOUS | Status: DC | PRN
  Administered 2015-09-28: 3 mg via INTRAVENOUS

## 2015-09-28 MED ORDER — LACTATED RINGERS IV SOLN
INTRAVENOUS | Status: DC | PRN
  Administered 2015-09-28 (×2): via INTRAVENOUS

## 2015-09-28 MED ORDER — MIDAZOLAM HCL 5 MG/5ML IJ SOLN
INTRAMUSCULAR | Status: DC | PRN
  Administered 2015-09-28: 2 mg via INTRAVENOUS

## 2015-09-28 MED ORDER — PHENOL 1.4 % MT LIQD: 1.0000 | OROMUCOSAL | Status: DC | PRN

## 2015-09-28 MED ORDER — ROCURONIUM BROMIDE 100 MG/10ML IV SOLN
INTRAVENOUS | Status: DC | PRN
  Administered 2015-09-28: 50 mg via INTRAVENOUS

## 2015-09-28 MED ORDER — PROPOFOL 10 MG/ML IV BOLUS
INTRAVENOUS | Status: DC | PRN
  Administered 2015-09-28: 120 mg via INTRAVENOUS

## 2015-09-28 MED ORDER — ROCURONIUM BROMIDE 50 MG/5ML IV SOLN
INTRAVENOUS | Status: AC
  Filled 2015-09-28: qty 1

## 2015-09-28 MED ORDER — HEMOSTATIC AGENTS (NO CHARGE) OPTIME
TOPICAL | Status: DC | PRN
  Administered 2015-09-28: 1 via TOPICAL

## 2015-09-28 MED ORDER — CEFAZOLIN SODIUM 1-5 GM-% IV SOLN
1.0000 g | Freq: Three times a day (TID) | INTRAVENOUS | Status: AC
  Administered 2015-09-28 – 2015-09-29 (×2): 1 g via INTRAVENOUS
  Filled 2015-09-28 (×2): qty 50

## 2015-09-28 MED ORDER — FENTANYL CITRATE (PF) 250 MCG/5ML IJ SOLN
INTRAMUSCULAR | Status: AC
  Filled 2015-09-28: qty 5

## 2015-09-28 MED ORDER — LISINOPRIL-HYDROCHLOROTHIAZIDE 10-12.5 MG PO TABS: 1.0000 | ORAL_TABLET | Freq: Every day | ORAL | Status: DC

## 2015-09-28 MED ORDER — GLYCOPYRROLATE 0.2 MG/ML IJ SOLN
INTRAMUSCULAR | Status: DC | PRN
  Administered 2015-09-28: 0.4 mg via INTRAVENOUS

## 2015-09-28 MED ORDER — MIDAZOLAM HCL 2 MG/2ML IJ SOLN
INTRAMUSCULAR | Status: AC
  Filled 2015-09-28: qty 4

## 2015-09-28 MED ORDER — OXYCODONE-ACETAMINOPHEN 5-325 MG PO TABS
1.0000 | ORAL_TABLET | ORAL | Status: DC | PRN
  Administered 2015-09-28 – 2015-09-30 (×11): 2 via ORAL
  Filled 2015-09-28 (×11): qty 2

## 2015-09-28 MED ORDER — SODIUM CHLORIDE 0.9 % IJ SOLN
3.0000 mL | Freq: Two times a day (BID) | INTRAMUSCULAR | Status: DC
  Administered 2015-09-29 (×2): 3 mL via INTRAVENOUS

## 2015-09-28 MED ORDER — EPHEDRINE SULFATE 50 MG/ML IJ SOLN
INTRAMUSCULAR | Status: AC
  Filled 2015-09-28: qty 1

## 2015-09-28 MED ORDER — ARTIFICIAL TEARS OP OINT
TOPICAL_OINTMENT | OPHTHALMIC | Status: AC
  Filled 2015-09-28: qty 3.5

## 2015-09-28 MED ORDER — THROMBIN 5000 UNITS EX SOLR
OROMUCOSAL | Status: DC | PRN
  Administered 2015-09-28: 10:00:00 via TOPICAL

## 2015-09-28 MED ORDER — FUROSEMIDE 40 MG PO TABS
40.0000 mg | ORAL_TABLET | Freq: Every day | ORAL | Status: DC
  Administered 2015-09-29 – 2015-09-30 (×2): 40 mg via ORAL
  Filled 2015-09-28 (×2): qty 1

## 2015-09-28 MED ORDER — ONDANSETRON HCL 4 MG/2ML IJ SOLN: 4.0000 mg | INTRAMUSCULAR | Status: DC | PRN

## 2015-09-28 MED ORDER — ONDANSETRON HCL 4 MG/2ML IJ SOLN
INTRAMUSCULAR | Status: DC | PRN
  Administered 2015-09-28: 4 mg via INTRAVENOUS

## 2015-09-28 MED ORDER — ALUM & MAG HYDROXIDE-SIMETH 200-200-20 MG/5ML PO SUSP: 30.0000 mL | Freq: Four times a day (QID) | ORAL | Status: DC | PRN

## 2015-09-28 MED ORDER — LUBIPROSTONE 24 MCG PO CAPS: 24.0000 ug | ORAL_CAPSULE | Freq: Two times a day (BID) | ORAL | Status: DC | PRN

## 2015-09-28 MED ORDER — ACETAMINOPHEN 325 MG PO TABS: 650.0000 mg | ORAL_TABLET | ORAL | Status: DC | PRN

## 2015-09-28 MED ORDER — STERILE WATER FOR INJECTION IJ SOLN
INTRAMUSCULAR | Status: AC
  Filled 2015-09-28: qty 10

## 2015-09-28 MED ORDER — SODIUM CHLORIDE 0.9 % IV SOLN: 250.0000 mL | INTRAVENOUS | Status: DC

## 2015-09-28 MED ORDER — HYDROMORPHONE HCL 1 MG/ML IJ SOLN
0.2500 mg | INTRAMUSCULAR | Status: DC | PRN
  Administered 2015-09-28 (×3): 0.5 mg via INTRAVENOUS

## 2015-09-28 MED ORDER — MORPHINE SULFATE (PF) 2 MG/ML IV SOLN
1.0000 mg | INTRAVENOUS | Status: DC | PRN
  Administered 2015-09-28: 2 mg via INTRAVENOUS
  Filled 2015-09-28 (×2): qty 1

## 2015-09-28 MED ORDER — EPHEDRINE SULFATE 50 MG/ML IJ SOLN
INTRAMUSCULAR | Status: DC | PRN
  Administered 2015-09-28 (×2): 5 mg via INTRAVENOUS
  Administered 2015-09-28: 10 mg via INTRAVENOUS

## 2015-09-28 SURGICAL SUPPLY — 51 items
BENZOIN TINCTURE PRP APPL 2/3 (GAUZE/BANDAGES/DRESSINGS) ×2 IMPLANT
BIT DRILL SM SPINE QC 14 (BIT) ×2 IMPLANT
BLADE ULTRA TIP 2M (BLADE) ×2 IMPLANT
BNDG GAUZE ELAST 4 BULKY (GAUZE/BANDAGES/DRESSINGS) IMPLANT
BUR BARREL STRAIGHT FLUTE 4.0 (BURR) IMPLANT
BUR MATCHSTICK NEURO 3.0 LAGG (BURR) ×2 IMPLANT
CANISTER SUCT 3000ML PPV (MISCELLANEOUS) IMPLANT
COVER MAYO STAND STRL (DRAPES) ×2 IMPLANT
DRAIN SNY WOU 7FLT (WOUND CARE) ×2 IMPLANT
DRAPE C-ARM 42X72 X-RAY (DRAPES) IMPLANT
DRAPE LAPAROTOMY 100X72 PEDS (DRAPES) ×2 IMPLANT
DRAPE MICROSCOPE LEICA (MISCELLANEOUS) ×2 IMPLANT
DRAPE POUCH INSTRU U-SHP 10X18 (DRAPES) ×2 IMPLANT
DRSG OPSITE POSTOP 4X6 (GAUZE/BANDAGES/DRESSINGS) ×2 IMPLANT
DURAPREP 6ML APPLICATOR 50/CS (WOUND CARE) ×2 IMPLANT
ELECT REM PT RETURN 9FT ADLT (ELECTROSURGICAL) ×2
ELECTRODE REM PT RTRN 9FT ADLT (ELECTROSURGICAL) ×1 IMPLANT
EVACUATOR SILICONE 100CC (DRAIN) ×2 IMPLANT
GAUZE SPONGE 4X4 12PLY STRL (GAUZE/BANDAGES/DRESSINGS) IMPLANT
GAUZE SPONGE 4X4 16PLY XRAY LF (GAUZE/BANDAGES/DRESSINGS) IMPLANT
GLOVE BIOGEL M 8.0 STRL (GLOVE) ×2 IMPLANT
GLOVE BIOGEL PI IND STRL 7.5 (GLOVE) ×2 IMPLANT
GLOVE BIOGEL PI INDICATOR 7.5 (GLOVE) ×2
GLOVE EXAM NITRILE LRG STRL (GLOVE) IMPLANT
GLOVE EXAM NITRILE MD LF STRL (GLOVE) IMPLANT
GLOVE EXAM NITRILE XL STR (GLOVE) IMPLANT
GLOVE EXAM NITRILE XS STR PU (GLOVE) IMPLANT
GLOVE SURG SS PI 7.0 STRL IVOR (GLOVE) ×4 IMPLANT
GOWN STRL REUS W/ TWL LRG LVL3 (GOWN DISPOSABLE) ×2 IMPLANT
GOWN STRL REUS W/ TWL XL LVL3 (GOWN DISPOSABLE) ×1 IMPLANT
GOWN STRL REUS W/TWL 2XL LVL3 (GOWN DISPOSABLE) IMPLANT
GOWN STRL REUS W/TWL LRG LVL3 (GOWN DISPOSABLE) ×2
GOWN STRL REUS W/TWL XL LVL3 (GOWN DISPOSABLE) ×1
HEMOSTAT POWDER KIT SURGIFOAM (HEMOSTASIS) ×2 IMPLANT
KIT BASIN OR (CUSTOM PROCEDURE TRAY) ×2 IMPLANT
KIT ROOM TURNOVER OR (KITS) ×2 IMPLANT
NEEDLE SPNL 22GX3.5 QUINCKE BK (NEEDLE) ×2 IMPLANT
NS IRRIG 1000ML POUR BTL (IV SOLUTION) ×2 IMPLANT
PACK LAMINECTOMY NEURO (CUSTOM PROCEDURE TRAY) ×2 IMPLANT
PATTIES SURGICAL .5 X1 (DISPOSABLE) ×2 IMPLANT
PLATE PROVIDENCE ONE LVL 14MM (Plate) ×8 IMPLANT
RUBBERBAND STERILE (MISCELLANEOUS) ×4 IMPLANT
SCREW PROVIDENCE 4.2X14MM (Screw) ×2 IMPLANT
SPACER CERVICAL FRGE 12X14X6-7 (Spacer) ×2 IMPLANT
SPONGE INTESTINAL PEANUT (DISPOSABLE) ×2 IMPLANT
SPONGE SURGIFOAM ABS GEL SZ50 (HEMOSTASIS) ×2 IMPLANT
STRIP CLOSURE SKIN 1/2X4 (GAUZE/BANDAGES/DRESSINGS) ×2 IMPLANT
SUT VIC AB 3-0 SH 8-18 (SUTURE) ×2 IMPLANT
TOWEL OR 17X24 6PK STRL BLUE (TOWEL DISPOSABLE) ×2 IMPLANT
TOWEL OR 17X26 10 PK STRL BLUE (TOWEL DISPOSABLE) ×2 IMPLANT
WATER STERILE IRR 1000ML POUR (IV SOLUTION) ×2 IMPLANT

## 2015-09-28 NOTE — Progress Notes (Signed)
Orthopedic Tech Progress Note Patient Details:  Gabriella Leon Feb 09, 1956 [id number]  Ortho Devices Type of Ortho Device: Soft collar Ortho Device/Splint Location: neck Ortho Device/Splint Interventions: Application   Gabriella Leon 09/28/2015, 11:12 AM

## 2015-09-28 NOTE — Progress Notes (Signed)
JP drain will not hold charge. Dr. Joya Salm aware and no new orders. Site WNL.

## 2015-09-28 NOTE — Anesthesia Procedure Notes (Signed)
Procedure Name: Intubation Date/Time: 09/28/2015 9:06 AM Performed by: [place] Pink Pre-anesthesia Checklist: Patient identified, Emergency Drugs available, Suction available, Patient being monitored and Timeout performed Patient Re-evaluated:Patient Re-evaluated prior to inductionOxygen Delivery Method: Circle system utilized Preoxygenation: Pre-oxygenation with 100% oxygen Intubation Type: IV induction Ventilation: Mask ventilation without difficulty Laryngoscope Size: Mac and 3 Grade View: Grade I Tube type: Oral Tube size: 7.0 mm Number of attempts: 1 Airway Equipment and Method: Stylet Placement Confirmation: ETT inserted through vocal cords under direct vision,  positive ETCO2 and breath sounds checked- equal and bilateral Secured at: 20 cm Tube secured with: Tape Dental Injury: Teeth and Oropharynx as per pre-operative assessment

## 2015-09-28 NOTE — Anesthesia Postprocedure Evaluation (Signed)
  Anesthesia Post-op Note  Patient: Gabriella Leon  Procedure(s) Performed: Procedure(s) with comments: Cervical three-four Anterior cervical decompression/diskectomy/fusion (N/A) - C3-4 Anterior cervical decompression/diskectomy/fusion  Patient Location: PACU  Anesthesia Type:General  Level of Consciousness: awake and alert   Airway and Oxygen Therapy: Patient Spontanous Breathing  Post-op Pain: Controlled  Post-op Assessment: Post-op Vital signs reviewed, Patient's Cardiovascular Status Stable and Respiratory Function Stable  Post-op Vital Signs: Reviewed  Filed Vitals:   09/28/15 1154  BP: 116/56  Pulse: 86  Temp:   Resp: 17    Complications: No apparent anesthesia complications

## 2015-09-28 NOTE — Anesthesia Preprocedure Evaluation (Addendum)
Anesthesia Evaluation  Patient identified by MRN, date of birth, ID band Patient awake    Reviewed: Allergy & Precautions, H&P , NPO status , Patient's Chart, lab work & pertinent test results, reviewed documented beta blocker date and time   Airway Mallampati: II  TM Distance: >3 FB Neck ROM: Full    Dental no notable dental hx. (+) Edentulous Upper, Partial Lower, Dental Advisory Given   Pulmonary Current Smoker,    Pulmonary exam normal breath sounds clear to auscultation       Cardiovascular hypertension, Pt. on medications and Pt. on home beta blockers  Rhythm:Regular Rate:Normal     Neuro/Psych  Headaches, Anxiety Depression    GI/Hepatic PUD, GERD  Medicated,(+)   Esophageal Varices    , Hepatitis -, B, C  Endo/Other  diabetes, Type 2, Oral Hypoglycemic Agents  Renal/GU Renal disease  negative genitourinary   Musculoskeletal   Abdominal   Peds  Hematology negative hematology ROS (+) anemia ,   Anesthesia Other Findings   Reproductive/Obstetrics negative OB ROS                            Anesthesia Physical Anesthesia Plan  ASA: III  Anesthesia Plan: General   Post-op Pain Management:    Induction: Intravenous  Airway Management Planned: Oral ETT  Additional Equipment:   Intra-op Plan:   Post-operative Plan: Extubation in OR  Informed Consent: I have reviewed the patients History and Physical, chart, labs and discussed the procedure including the risks, benefits and alternatives for the proposed anesthesia with the patient or authorized representative who has indicated his/her understanding and acceptance.   Dental advisory given  Plan Discussed with: CRNA  Anesthesia Plan Comments:         Anesthesia Quick Evaluation

## 2015-09-28 NOTE — Transfer of Care (Signed)
Immediate Anesthesia Transfer of Care Note  Patient: Gabriella Leon  Procedure(s) Performed: Procedure(s) with comments: Cervical three-four Anterior cervical decompression/diskectomy/fusion (N/A) - C3-4 Anterior cervical decompression/diskectomy/fusion  Patient Location: PACU  Anesthesia Type:General  Level of Consciousness: awake and alert   Airway & Oxygen Therapy: Patient Spontanous Breathing and Patient connected to nasal cannula oxygen  Post-op Assessment: Report given to RN and Post -op Vital signs reviewed and stable  Post vital signs: Reviewed and stable  Last Vitals:  Filed Vitals:   09/28/15 0607  BP: 107/45  Pulse: 80  Temp: 36.9 C  Resp: 16    Complications: No apparent anesthesia complications

## 2015-09-28 NOTE — Progress Notes (Signed)
Patient admitted from PACU. Patient alert and oriented x 4. Patient oriented to room and made comfortable.will continue to monitor.

## 2015-09-29 NOTE — Progress Notes (Signed)
Patient ID: Gabriella Leon, female   DOB: Jan 09, 1956, 59 y.o.   MRN: [id number] BP 118/62 mmHg  Pulse 74  Temp(Src) 98.3 F (36.8 C) (Oral)  Resp 18  Wt 84.823 kg (187 lb)  SpO2 100% Alert and oriented x 4, speech is clear and fluent Remains weak in left upper extremity, 4-/5 strength in intrinsics, grip, biceps, triceps Wound is clean dry, no signs of infection Not ready for discharge remains on oxygen.

## 2015-09-29 NOTE — Evaluation (Signed)
Physical Therapy Evaluation Patient Details Name: Gabriella Leon MRN: [id number] DOB: 09-07-1956 Today's Date: 09/29/2015   History of Present Illness  59 y.o. s/p Anterior cervical 3-4 diskectomy, decompression of the spinal cord, removal of calcified disk mostly going into the body of C3, bilateral foraminotomy, interbody fusion with bone graft and a plate.  Clinical Impression  Patient is s/p above surgery, presenting with functional limitations due to the deficits listed below (see PT Problem List). Slow and guarded but stable, using RW for moderate support while ambulating up to 85 feet today. No buckling or overt loss of balance noted. Has son to stay with her at discharge and an aide as needed. Patient will benefit from skilled PT to increase their independence and safety with mobility to allow discharge to the venue listed below.       Follow Up Recommendations Other (comment);Supervision - Intermittent (OPPT when cleared by MD)    Equipment Recommendations   (TBD)    Recommendations for Other Services       Precautions / Restrictions Precautions Precautions: Fall;Cervical Precaution Comments: educated on precautions Required Braces or Orthoses: Cervical Brace Cervical Brace: Soft collar Restrictions Weight Bearing Restrictions: No      Mobility  Bed Mobility Overal bed mobility: Modified Independent             General bed mobility comments: HOB elevated, required extra time  Transfers Overall transfer level: Needs assistance Equipment used: Rolling walker (2 wheeled) Transfers: Sit to/from Stand Sit to Stand: Supervision         General transfer comment: Supervision for safety. Slow to rise but stable upon standing with hands on RW.  Ambulation/Gait Ambulation/Gait assistance: Min guard Ambulation Distance (Feet): 85 Feet Assistive device: Rolling walker (2 wheeled);None Gait Pattern/deviations: Step-through pattern;Decreased stride  length;Trunk flexed Gait velocity: decreased Gait velocity interpretation: Below normal speed for age/gender General Gait Details: Educated on safe DME use with a rolling walker. VC for upright posture, larger step length, and forward gaze. Trialed 5 foot distance without RW. Very guarded with and without assistive device for support but appears more confident with RW.  Stairs            Wheelchair Mobility    Modified Rankin (Stroke Patients Only)       Balance Overall balance assessment: Needs assistance Sitting-balance support: No upper extremity supported;Feet supported Sitting balance-Leahy Scale: Good     Standing balance support: No upper extremity supported Standing balance-Leahy Scale: Fair                               Pertinent Vitals/Pain Pain Assessment: 0-10 Pain Score: 5  Pain Location: neck, Lt hand Pain Descriptors / Indicators: Aching ("Burst") Pain Intervention(s): Monitored during session;Repositioned    Home Living Family/patient expects to be discharged to:: Private residence Living Arrangements: Alone Available Help at Discharge: Family;Available 24 hours/day (son coming to stay with her; aide assists 7x/week/3 hours) Type of Home: House Home Access: Stairs to enter Entrance Stairs-Rails: None Entrance Stairs-Number of Steps: 1 Home Layout: One level Home Equipment: None      Prior Function Level of Independence: Needs assistance   Gait / Transfers Assistance Needed: [place] furniture for balance.  ADL's / Homemaking Assistance Needed: aide assists with cooking/cleaning and tub transfer as well as dressing  Comments: Has not fallen in past 1 year     Hand Dominance   Dominant Hand: Right    Extremity/Trunk  Assessment   Upper Extremity Assessment: Defer to OT evaluation           Lower Extremity Assessment: LLE deficits/detail   LLE Deficits / Details: Grossly 4-/5 strength, weaker with MMT than displayed  functionally while ambulating.     Communication   Communication: No difficulties  Cognition Arousal/Alertness: Awake/alert Behavior During Therapy: WFL for tasks assessed/performed Overall Cognitive Status: Within Functional Limits for tasks assessed                      General Comments General comments (skin integrity, edema, etc.): Reviewed precautions. SpO2 maintained 98% throughout session on room air. RN notified    Exercises General Exercises - Lower Extremity Ankle Circles/Pumps: AROM;Both;10 reps;Seated Quad Sets: Strengthening;Both;10 reps;Seated Gluteal Sets: Strengthening;Both;10 reps;Seated      Assessment/Plan    PT Assessment Patient needs continued PT services  PT Diagnosis Difficulty walking;Abnormality of gait;Generalized weakness;Acute pain   PT Problem List Decreased strength;Decreased activity tolerance;Decreased balance;Decreased mobility;Decreased range of motion;Decreased coordination;Decreased knowledge of use of DME;Decreased knowledge of precautions;Impaired sensation;Pain  PT Treatment Interventions DME instruction;Gait training;Stair training;Functional mobility training;Therapeutic activities;Therapeutic exercise;Balance training;Neuromuscular re-education;Patient/family education;Modalities   PT Goals (Current goals can be found in the Care Plan section) Acute Rehab PT Goals Patient Stated Goal: not stated PT Goal Formulation: With patient Time For Goal Achievement: 10/13/15 Potential to Achieve Goals: Good    Frequency Min 5X/week   Barriers to discharge Decreased caregiver support lives alone however son plans to stay with pt    Co-evaluation               End of Session Equipment Utilized During Treatment: Cervical collar Activity Tolerance: Patient tolerated treatment well Patient left: in chair;with call bell/phone within reach;with chair alarm set Nurse Communication: Mobility status (SpO2 98%, supplemental O2  removed)         Time: 3151-7616 PT Time Calculation (min) (ACUTE ONLY): 22 min   Charges:   PT Evaluation $Initial PT Evaluation Tier I: 1 Procedure     PT G CodesEllouise Newer 09/29/2015, 2:37 PM Camille Bal [place], [place]

## 2015-09-29 NOTE — Op Note (Signed)
Gabriella Leon, Gabriella Leon       ACCOUNT NO.:  [id number]  MEDICAL RECORD NO.:  [id number]  LOCATION:  5C08C                        FACILITY:  [facility]  PHYSICIAN:  Leeroy Cha, M.D.   DATE OF BIRTH:  26-Aug-1956  DATE OF PROCEDURE:  09/28/2015 DATE OF DISCHARGE:                              OPERATIVE REPORT   PREOPERATIVE DIAGNOSES:  Cervical 3-4 stenosis with calcified herniated disk, severe narrowing of the canal, myelopathy, history of liver disease with low platelets.  POSTOPERATIVE DIAGNOSES:  Cervical 3-4 stenosis with calcified herniated disk, severe narrowing of the canal, myelopathy, history of liver disease with low platelets.  PROCEDURES:  Anterior cervical 3-4 diskectomy, decompression of the spinal cord, removal of calcified disk mostly going into the body of C3, bilateral foraminotomy, interbody fusion with bone graft and a plate. Microscope.  SURGEON:  Leeroy Cha, M.D.  CLINICAL HISTORY:  The patient was seen in my office, having difficulty walking, weakness at the left arm and the MRI shows severe stenosis secondary to a large herniated disk, calcified at the level of C3-4.  We were going to do surgery several weeks ago, but her platelets were low. She has a history of liver disease.  She was seen by the hematologist and today, she came back with platelets were within normal limits. Surgery was advised.  She knew the risk and benefits of surgery.  DESCRIPTION OF PROCEDURE:  The patient was taken to the OR.  After intubation, the left side of the neck was cleaned with DuraPrep.  Drapes were applied.  Transverse incision was made through the skin, subcutaneous tissue with dissection all the way down to the cervical spine.  X-rays showed that we were right at the level of C3-C4.  Then with the help of the microscope, we opened the anterior ligament and we entered the disk space.  The patient had quite a bit of degenerative disk.  Once the bulk of the disk  was removed, we found that indeed she has a thick, calcified posterior ligament with bone and this going up to C3 and down to C4.  Using the 1-mm Kerrison punch as well as the microcurette, we started to decompress the spinal cord medially and then bilaterally with plenty of space for the foramen.  From then on, graft of 6-mm height, lordotic was introduced to replace the disk followed by a plate using 4 screws.  Lateral cervical spine showed good position of the plate and screws.  From then on, the area was irrigated.  Although, we achieved good hemostasis.  Because of her history of bleeding, we decided to leave a drain overnight.  The patient woke up and she is moving all the four extremities.          ______________________________ Leeroy Cha, M.D.    EB/MEDQ  D:  09/28/2015  T:  09/29/2015  Job:  [id number]

## 2015-09-29 NOTE — Evaluation (Signed)
Occupational Therapy Evaluation Patient Details Name: Gabriella Leon MRN: [id number] DOB: 11/20/1956 Today's Date: 09/29/2015    History of Present Illness 59 y.o. s/p Anterior cervical 3-4 diskectomy, decompression of the spinal cord, removal of calcified disk mostly going into the body of C3, bilateral foraminotomy, interbody fusion with bone graft and a plate.   Clinical Impression   Pt s/p above. Pt receiving assist with ADLs, PTA. Feel pt will benefit from acute OT to increase independence and address left hand coordination prior to d/c.     Follow Up Recommendations  No OT follow up;Supervision - Intermittent    Equipment Recommendations  Tub/shower bench    Recommendations for Other Services       Precautions / Restrictions Precautions Precautions: Fall;Cervical Precaution Comments: educated on precautions Required Braces or Orthoses: Cervical Brace Cervical Brace: Soft collar Restrictions Weight Bearing Restrictions: No      Mobility Bed Mobility Overal bed mobility: Needs Assistance Bed Mobility: Rolling;Sidelying to Sit;Sit to Sidelying Rolling: Supervision Sidelying to sit: Supervision     Sit to sidelying: Supervision General bed mobility comments: cues for technique (tactile and verbal).  Transfers Overall transfer level: Needs assistance Equipment used: None Transfers: Sit to/from Stand Sit to Stand: Supervision              Balance Overall balance assessment: History of Falls    Min guard for ambulation.                                      ADL Overall ADL's : Needs assistance/impaired                     Lower Body Dressing: Set up;Supervision/safety;Sit to/from stand   Toilet Transfer: Min guard;Ambulation (sit to stand from bed-supervision)           Functional mobility during ADLs: Min guard General ADL Comments: Educated on tub transfer technique (tub bench). Educated on LB ADL technique.   Discussed incorporating precautions into functional activities.     Vision     Perception     Praxis      Pertinent Vitals/Pain Pain Assessment: 0-10 Pain Score: 6  Pain Location: neck Pain Descriptors / Indicators: Aching Pain Intervention(s): Monitored during session     Hand Dominance Right   Extremity/Trunk Assessment Upper Extremity Assessment Upper Extremity Assessment: LUE deficits/detail LUE Sensation: decreased light touch LUE Coordination: decreased fine motor   Lower Extremity Assessment Lower Extremity Assessment: Defer to PT evaluation       Communication Communication Communication: No difficulties   Cognition Arousal/Alertness: Awake/alert Behavior During Therapy: WFL for tasks assessed/performed Overall Cognitive Status: Within Functional Limits for tasks assessed                     General Comments       Exercises  Educated on fine motor coordination activities/exercise. Pt performed opposition exercise.     Shoulder Instructions      Home Living Family/patient expects to be discharged to:: Private residence Living Arrangements: Alone Available Help at Discharge: Family;Available 24 hours/day (son coming to stay with her; aide assists 7x/week/3 hours) Type of Home: House Home Access: Stairs to enter [employer] of Steps: 1 Entrance Stairs-Rails: None Home Layout: One level     Bathroom Shower/Tub: Teacher, early years/pre: Standard  Prior Functioning/Environment Level of Independence: Needs assistance    ADL's / Homemaking Assistance Needed: aide assists with cooking/cleaning and tub transfer as well as dressing        OT Diagnosis: Acute pain   OT Problem List: Impaired balance (sitting and/or standing);Decreased knowledge of use of DME or AE;Decreased knowledge of precautions;Pain;Impaired UE functional use;Impaired sensation;Decreased coordination   OT  Treatment/Interventions: Self-care/ADL training;Therapeutic exercise;DME and/or AE instruction;Therapeutic activities;Patient/family education;Balance training    OT Goals(Current goals can be found in the care plan section) Acute Rehab OT Goals Patient Stated Goal: not stated OT Goal Formulation: With patient Time For Goal Achievement: 10/06/15 Potential to Achieve Goals: Good ADL Goals Pt Will Perform Grooming: standing;with set-up Pt Will Perform Lower Body Dressing: with set-up;sit to/from stand Pt Will Transfer to Toilet: with modified independence;ambulating;regular height toilet Additional ADL Goal #1: Pt will independently perform HEP for left UE to increase coordination.  OT Frequency: Min 2X/week   Barriers to D/C:            Co-evaluation              End of Session Equipment Utilized During Treatment: Gait belt;Cervical collar Nurse Communication: Mobility status  Activity Tolerance: Patient tolerated treatment well Patient left: in bed;with call bell/phone within reach;with SCD's reapplied   Time: 1610-9604 OT Time Calculation (min): 16 min Charges:  OT General Charges $OT Visit: 1 Procedure OT Evaluation $Initial OT Evaluation Tier I: 1 Procedure G-CodesBenito Mccreedy OTR/L [id number] 09/29/2015, 10:00 AM

## 2015-09-30 NOTE — Progress Notes (Signed)
Occupational Therapy Treatment Patient Details Name: Gabriella Leon MRN: [id number] DOB: 1956-11-15 Today's Date: 09/30/2015    History of present illness 59 y.o. s/p Anterior cervical 3-4 diskectomy, decompression of the spinal cord, removal of calcified disk mostly going into the body of C3, bilateral foraminotomy, interbody fusion with bone graft and a plate.   OT comments  Education provided in session. Plan to see again tomorrow before d/c.  Follow Up Recommendations  No OT follow up;Supervision - Intermittent    Equipment Recommendations  Tub/shower bench    Recommendations for Other Services      Precautions / Restrictions Precautions Precautions: Fall;Cervical Precaution Comments: cues given for cervical precautions Required Braces or Orthoses: Cervical Brace Cervical Brace: Soft collar Restrictions Weight Bearing Restrictions: No       Mobility Bed Mobility Overal bed mobility: Needs Assistance Bed Mobility: Rolling;Sidelying to Sit;Sit to Sidelying Rolling: Supervision Sidelying to sit: Modified independent (Device/Increase time)     Sit to sidelying: Supervision General bed mobility comments: cues to get all the way on side before rolling; Supervision/Mod I for rolling.  Transfers Overall transfer level: Modified independent   Balance  No LOB in session.                                 ADL Overall ADL's : Needs assistance/impaired     Grooming: Applying deodorant;Oral care;Set up;Supervision/safety;Standing               Lower Body Dressing: Supervision/safety;Sit to/from stand;Set up   Toilet Transfer: Supervision/safety;Ambulation (sit to stand from bed-Mod I)           Functional mobility during ADLs: Supervision/safety General ADL Comments: Discussed incorporating precautions into functional activites. Educated on fine motor coordination activities and encouraged pt to be using left hand for activities.        Vision                     Perception     Praxis      Cognition  Awake/Alert Behavior During Therapy: WFL for tasks assessed/performed Overall Cognitive Status: Within Functional Limits for tasks assessed                       Extremity/Trunk Assessment               Exercises Other Exercises Other Exercises: performed left fine motor coordination activities-gave handout and explained other activities she could do with left hand.   Shoulder Instructions       General Comments      Pertinent Vitals/ Pain       Pain Assessment: 0-10 Pain Score: 6  Pain Location: neck Pain Descriptors / Indicators: Aching Pain Intervention(s): Monitored during session;Repositioned  Home Living                                          Prior Functioning/Environment              Frequency Min 2X/week     Progress Toward Goals  OT Goals(current goals can now be found in the care plan section)  Progress towards OT goals: Progressing toward goals  Acute Rehab OT Goals Patient Stated Goal: not stated OT Goal Formulation: With patient Time For Goal Achievement: 10/06/15 Potential to Achieve Goals: Good ADL  Goals Pt Will Perform Grooming: standing;with set-up Pt Will Perform Lower Body Dressing: with set-up;sit to/from stand Pt Will Transfer to Toilet: with modified independence;ambulating;regular height toilet Additional ADL Goal #1: Pt will independently perform HEP for left UE to increase coordination.  Plan Discharge plan remains appropriate    Co-evaluation                 End of Session Equipment Utilized During Treatment: Cervical collar   Activity Tolerance Patient tolerated treatment well   Patient Left in bed;with call bell/phone within reach;with bed alarm set;with family/visitor present   Nurse Communication          Time: 6286-3817 OT Time Calculation (min): 17 min  Charges: OT General Charges $OT  Visit: 1 Procedure OT Treatments $Self Care/Home Management : 8-22 mins  Benito Mccreedy OTR/L [phone] 09/30/2015, 11:20 AM

## 2015-09-30 NOTE — Progress Notes (Signed)
No issues overnight. Pt has some neck pain and left arm numbness, controlled with medications.  EXAM:  BP 110/55 mmHg  Pulse 77  Temp(Src) 98.9 F (37.2 C) (Oral)  Resp 18  Wt 84.823 kg (187 lb)  SpO2 97%  Awake, alert, oriented  Speech fluent, appropriate  CN grossly intact  Mild LUE weakness Wound c/d/i  IMPRESSION:  59 y.o. female s/p ACDF, doing well  PLAN: - Pt want to go home tomorrow - D/C Hemovac

## 2015-09-30 NOTE — Progress Notes (Signed)
Physical Therapy Treatment Patient Details Name: Gabriella Leon MRN: [id number] DOB: 1956-05-28 Today's Date: 09/30/2015    History of Present Illness 59 y.o. s/p Anterior cervical 3-4 diskectomy, decompression of the spinal cord, removal of calcified disk mostly going into the body of C3, bilateral foraminotomy, interbody fusion with bone graft and a plate.    PT Comments    Pt progressing with mobility.  Cont with POC.   Follow Up Recommendations  Other (comment) (OPPT when cleared by MD)     Equipment Recommendations   (TBD; may need RW but will decide next session)    Recommendations for Other Services       Precautions / Restrictions Precautions Precautions: Fall;Cervical Precaution Comments: educated on precautions Required Braces or Orthoses: Cervical Brace Cervical Brace: Soft collar Restrictions Weight Bearing Restrictions: No    Mobility  Bed Mobility Overal bed mobility: Modified Independent                Transfers Overall transfer level: Needs assistance Equipment used: Rolling walker (2 wheeled) Transfers: Sit to/from Stand Sit to Stand: Supervision         General transfer comment: supervision for safety with min cues for safest hand placement  Ambulation/Gait Ambulation/Gait assistance: Supervision Ambulation Distance (Feet): 120 Feet Assistive device: Rolling walker (2 wheeled);None Gait Pattern/deviations: Step-through pattern Gait velocity: decreased   General Gait Details: cues for use of RW & upright posture.     Stairs Stairs: Yes Stairs assistance: Min guard Stair Management: One rail Right Number of Stairs: 1 General stair comments: practiced 1 step to ensure pt able to achieve adequate foot clearance  Wheelchair Mobility    Modified Rankin (Stroke Patients Only)       Balance                                    Cognition Arousal/Alertness: Awake/alert Behavior During Therapy: WFL for tasks  assessed/performed Overall Cognitive Status: Within Functional Limits for tasks assessed                      Exercises      General Comments        Pertinent Vitals/Pain Pain Assessment: 0-10 Pain Score: 4  Pain Location: neck Pain Descriptors / Indicators: Sore;Aching Pain Intervention(s): Monitored during session    Home Living                      Prior Function            PT Goals (current goals can now be found in the care plan section) Acute Rehab PT Goals Patient Stated Goal: to go home PT Goal Formulation: With patient Time For Goal Achievement: 10/13/15 Potential to Achieve Goals: Good Progress towards PT goals: Progressing toward goals    Frequency  Min 5X/week    PT Plan Current plan remains appropriate    Co-evaluation             End of Session Equipment Utilized During Treatment: Cervical collar Activity Tolerance: Patient tolerated treatment well Patient left: in chair;with call bell/phone within reach     Time: 0804-0819 PT Time Calculation (min) (ACUTE ONLY): 15 min  Charges:  $Gait Training: 8-22 mins                    G Codes:      Sena Hitch 09/30/2015,  10:42 AM   Sarajane Marek, PTA [phone] 09/30/2015

## 2015-10-01 ENCOUNTER — Encounter (HOSPITAL_COMMUNITY): Payer: Self-pay | Admitting: Neurosurgery

## 2015-10-01 NOTE — Care Management Note (Addendum)
Case Management Note  Patient Details  Name: Gabriella Leon MRN: [id number] Date of Birth: Jul 07, 1956  Subjective/Objective:                    Action/Plan: Patient admitted with cervical stenosis of spinal canal. Pt underwent a C3-4 decompression/fusion/discectomy. Patient lives at home alone. No orders for any outpatient therapy. PT recommending a rolling walker. Pt ready to leave so given order for walker and she is going to pick it up at the [facility] on [address].   Expected Discharge Date:                  Expected Discharge Plan:  Home/Self Care  In-House Referral:     Discharge planning Services  CM Consult  Post Acute Care Choice:    Choice offered to:     DME Arranged:    DME Agency:     HH Arranged:    HH Agency:     Status of Service:  In process, will continue to follow  Medicare Important Message Given:    Date Medicare IM Given:    Medicare IM give by:    Date Additional Medicare IM Given:    Additional Medicare Important Message give by:     If discussed at [place] of Stay Meetings, dates discussed:    Additional Comments:  Pollie Friar, RN 10/01/2015, 9:40 AM

## 2015-10-01 NOTE — Progress Notes (Signed)
Physical Therapy Treatment Patient Details Name: Gabriella Leon MRN: [id number] DOB: 09-Apr-1956 Today's Date: 10/01/2015    History of Present Illness 59 y.o. s/p Anterior cervical 3-4 diskectomy, decompression of the spinal cord, removal of calcified disk mostly going into the body of C3, bilateral foraminotomy, interbody fusion with bone graft and a plate.    PT Comments    Pt con't to demo impaired balance due to residual L sided numbness. Pt requires RW for safe ambulation to minimize falls risk.  Follow Up Recommendations  No PT follow up;Supervision - Intermittent     Equipment Recommendations  Rolling walker with 5" wheels    Recommendations for Other Services       Precautions / Restrictions Precautions Precautions: Fall;Cervical Precaution Comments: pt able to recall 2/3 back precautions Required Braces or Orthoses: Cervical Brace Cervical Brace: Soft collar Restrictions Weight Bearing Restrictions: No    Mobility  Bed Mobility Overal bed mobility: Needs Assistance Bed Mobility: Rolling;Sidelying to Sit;Sit to Sidelying Rolling: Modified independent (Device/Increase time) Sidelying to sit: Modified independent (Device/Increase time)     Sit to sidelying: Modified independent (Device/Increase time)    Transfers Overall transfer level: Modified independent Equipment used: Rolling walker (2 wheeled) Transfers: Sit to/from Stand Sit to Stand: Supervision         General transfer comment: supervision for safety with min cues for safest hand placement  Ambulation/Gait Ambulation/Gait assistance: Supervision Ambulation Distance (Feet): 150 Feet Assistive device: Rolling walker (2 wheeled) Gait Pattern/deviations: Step-through pattern Gait velocity: decreased Gait velocity interpretation: Below normal speed for age/gender General Gait Details: cues to stay in walker and not push to far ahead. attempted to amb without RW however pt with L LE  residual numbness and unsteady without AD   Stairs Stairs: Yes Stairs assistance: Min assist Stair Management: Step to pattern (Hand held assist due to no handrail at home) Number of Stairs: 2 General stair comments: educated on "up with the good, down with the bad" and how to have significant other assist her due to no handrail  Wheelchair Mobility    Modified Rankin (Stroke Patients Only)       Balance Overall balance assessment: Needs assistance         Standing balance support: No upper extremity supported Standing balance-Leahy Scale: Fair Standing balance comment: able to stand at sink statically safely however requires RW for safe ambulation                     Cognition Arousal/Alertness: Awake/alert Behavior During Therapy: WFL for tasks assessed/performed Overall Cognitive Status: Within Functional Limits for tasks assessed                      Exercises      General Comments        Pertinent Vitals/Pain Pain Assessment: 0-10 Pain Score: 3  Pain Location: neck Pain Descriptors / Indicators: Aching Pain Intervention(s): Monitored during session    Home Living Family/patient expects to be discharged to:: Private residence Living Arrangements: Spouse/significant other;Children                  Prior Function            PT Goals (current goals can now be found in the care plan section) Acute Rehab PT Goals Patient Stated Goal: home today Progress towards PT goals: Progressing toward goals    Frequency  Min 5X/week    PT Plan Current plan remains appropriate  Co-evaluation             End of Session Equipment Utilized During Treatment: Cervical collar Activity Tolerance: Patient tolerated treatment well Patient left: in chair;with call bell/phone within reach     Time: 0913-0924 PT Time Calculation (min) (ACUTE ONLY): 11 min  Charges:  $Gait Training: 8-22 mins                    G Codes:       [name] 10/01/2015, 10:12 AM   Kittie Plater, PT, DPT Pager #: [phone] Office #: [phone]

## 2015-10-01 NOTE — Progress Notes (Signed)
Pt discharged to home with family. Discharge instructions given. Pt has all belongings. Will leave hospital with estranged husband. Left unit via wheelchair with volunteer services at [facility]

## 2015-10-01 NOTE — Discharge Summary (Signed)
Physician Discharge Summary  Patient ID: Gabriella Leon MRN: [id number] DOB/AGE: 26-Oct-1956 59 y.o.  Admit date: 09/28/2015 Discharge date: 10/01/2015  Admission Diagnoses:cervical stenosis c3-4  Discharge Diagnoses:  Active Problems:   Cervical stenosis of spinal canal   Discharged Condition: no pain  Hospital Course: surgery  Consults: none  Significant Diagnostic Studies: mri  Treatments: ACDF 34  Discharge Exam: Blood pressure 129/70, pulse 67, temperature 98.5 F (36.9 C), temperature source Oral, resp. rate 20, weight 84.823 kg (187 lb), SpO2 98 %. Better, ambulating  Disposition: home     Medication List    ASK your doctor about these medications        folic acid 1 MG tablet  Commonly known as:  FOLVITE  Take 1 tablet (1 mg total) by mouth daily.     furosemide 40 MG tablet  Commonly known as:  LASIX  Take 40 mg by mouth daily.     glimepiride 2 MG tablet  Commonly known as:  AMARYL  Take 2 mg by mouth daily.     HYDROcodone-acetaminophen 5-325 MG tablet  Commonly known as:  NORCO/VICODIN  Take 1 tablet by mouth every 6 (six) hours as needed for moderate pain.     ibuprofen 200 MG tablet  Commonly known as:  ADVIL,MOTRIN  Take 400 mg by mouth every 6 (six) hours as needed (pain).     lisinopril-hydrochlorothiazide 10-12.5 MG tablet  Commonly known as:  PRINZIDE,ZESTORETIC  Take 1 tablet by mouth daily.     lubiprostone 24 MCG capsule  Commonly known as:  AMITIZA  Take 24 mcg by mouth 2 (two) times daily as needed for constipation.     phytonadione 5 MG tablet  Commonly known as:  VITAMIN K  Take 1 tablet (5 mg total) by mouth daily.     propranolol 20 MG tablet  Commonly known as:  INDERAL  Take 1 tablet (20 mg total) by mouth 2 (two) times daily.     ranitidine 300 MG capsule  Commonly known as:  ZANTAC  Take 300 mg by mouth daily.     ROMIPLOSTIM [place]  Inject 1 each into the skin once a week. Injections are done at Dr.  Grier Mitts office, [name] every Tuesday - next injection due 09/25/15 (42mcg/kg)     sertraline 25 MG tablet  Commonly known as:  ZOLOFT  Take 25 mg by mouth daily.     spironolactone 50 MG tablet  Commonly known as:  ALDACTONE  Take 50 mg by mouth daily.         Signed: Floyce Stakes 10/01/2015, 9:33 AM

## 2015-10-02 ENCOUNTER — Other Ambulatory Visit: Payer: Self-pay | Admitting: *Deleted

## 2015-10-02 ENCOUNTER — Ambulatory Visit: Payer: Medicaid Other

## 2015-10-02 ENCOUNTER — Telehealth: Payer: Self-pay | Admitting: Hematology

## 2015-10-02 ENCOUNTER — Other Ambulatory Visit: Payer: Medicaid Other

## 2015-10-02 ENCOUNTER — Encounter (HOSPITAL_COMMUNITY): Payer: Self-pay | Admitting: Neurosurgery

## 2015-10-02 NOTE — Telephone Encounter (Signed)
s.w. pt and confirmed tomorrows appt.Marland KitchenMarland KitchenMarland KitchenMarland Kitchenpt ok and aware

## 2015-10-03 ENCOUNTER — Ambulatory Visit (HOSPITAL_BASED_OUTPATIENT_CLINIC_OR_DEPARTMENT_OTHER): Payer: Medicaid Other

## 2015-10-03 ENCOUNTER — Other Ambulatory Visit (HOSPITAL_BASED_OUTPATIENT_CLINIC_OR_DEPARTMENT_OTHER): Payer: Medicaid Other

## 2015-10-03 VITALS — BP 127/74 | HR 78 | Temp 98.5°F | Resp 16

## 2015-10-03 DIAGNOSIS — D61818 Other pancytopenia: Secondary | ICD-10-CM

## 2015-10-03 DIAGNOSIS — D693 Immune thrombocytopenic purpura: Secondary | ICD-10-CM

## 2015-10-03 DIAGNOSIS — D696 Thrombocytopenia, unspecified: Secondary | ICD-10-CM

## 2015-10-03 LAB — CBC WITH DIFFERENTIAL/PLATELET
BASO%: 0.4 % (ref 0.0–2.0)
Basophils Absolute: 0 10*3/uL (ref 0.0–0.1)
EOS%: 3.4 % (ref 0.0–7.0)
Eosinophils Absolute: 0.2 10*3/uL (ref 0.0–0.5)
HCT: 33.3 % — ABNORMAL LOW (ref 34.8–46.6)
HEMOGLOBIN: 11.2 g/dL — AB (ref 11.6–15.9)
LYMPH%: 24.8 % (ref 14.0–49.7)
MCH: 34 pg (ref 25.1–34.0)
MCHC: 33.7 g/dL (ref 31.5–36.0)
MCV: 101.1 fL — AB (ref 79.5–101.0)
MONO#: 0.7 10*3/uL (ref 0.1–0.9)
MONO%: 13.6 % (ref 0.0–14.0)
NEUT%: 57.8 % (ref 38.4–76.8)
NEUTROS ABS: 2.9 10*3/uL (ref 1.5–6.5)
PLATELETS: 202 10*3/uL (ref 145–400)
RBC: 3.29 10*6/uL — ABNORMAL LOW (ref 3.70–5.45)
RDW: 14 % (ref 11.2–14.5)
WBC: 5 10*3/uL (ref 3.9–10.3)
lymph#: 1.2 10*3/uL (ref 0.9–3.3)

## 2015-10-03 MED ORDER — ROMIPLOSTIM 250 MCG ~~LOC~~ SOLR
2.0000 ug/kg | Freq: Once | SUBCUTANEOUS | Status: AC
  Administered 2015-10-03: 170 ug via SUBCUTANEOUS
  Filled 2015-10-03: qty 0.34

## 2015-10-09 ENCOUNTER — Encounter: Payer: Self-pay | Admitting: Hematology

## 2015-10-09 ENCOUNTER — Ambulatory Visit (HOSPITAL_BASED_OUTPATIENT_CLINIC_OR_DEPARTMENT_OTHER): Payer: Medicaid Other

## 2015-10-09 ENCOUNTER — Ambulatory Visit (HOSPITAL_BASED_OUTPATIENT_CLINIC_OR_DEPARTMENT_OTHER): Payer: Medicaid Other | Admitting: Hematology

## 2015-10-09 ENCOUNTER — Other Ambulatory Visit: Payer: Medicaid Other

## 2015-10-09 ENCOUNTER — Telehealth: Payer: Self-pay | Admitting: Hematology

## 2015-10-09 ENCOUNTER — Ambulatory Visit: Payer: Medicaid Other

## 2015-10-09 ENCOUNTER — Other Ambulatory Visit (HOSPITAL_BASED_OUTPATIENT_CLINIC_OR_DEPARTMENT_OTHER): Payer: Medicaid Other

## 2015-10-09 VITALS — BP 128/68 | HR 72 | Temp 98.9°F | Resp 18 | Ht 64.0 in | Wt 188.9 lb

## 2015-10-09 DIAGNOSIS — D693 Immune thrombocytopenic purpura: Secondary | ICD-10-CM

## 2015-10-09 DIAGNOSIS — D61818 Other pancytopenia: Secondary | ICD-10-CM

## 2015-10-09 DIAGNOSIS — D696 Thrombocytopenia, unspecified: Secondary | ICD-10-CM

## 2015-10-09 LAB — CBC WITH DIFFERENTIAL/PLATELET
BASO%: 0.8 % (ref 0.0–2.0)
Basophils Absolute: 0 10*3/uL (ref 0.0–0.1)
EOS%: 3.5 % (ref 0.0–7.0)
Eosinophils Absolute: 0.1 10*3/uL (ref 0.0–0.5)
HCT: 29.7 % — ABNORMAL LOW (ref 34.8–46.6)
HGB: 9.9 g/dL — ABNORMAL LOW (ref 11.6–15.9)
LYMPH%: 23 % (ref 14.0–49.7)
MCH: 33.4 pg (ref 25.1–34.0)
MCHC: 33.3 g/dL (ref 31.5–36.0)
MCV: 100.3 fL (ref 79.5–101.0)
MONO#: 0.5 10*3/uL (ref 0.1–0.9)
MONO%: 13.3 % (ref 0.0–14.0)
NEUT%: 59.4 % (ref 38.4–76.8)
NEUTROS ABS: 2.2 10*3/uL (ref 1.5–6.5)
Platelets: 116 10*3/uL — ABNORMAL LOW (ref 145–400)
RBC: 2.96 10*6/uL — AB (ref 3.70–5.45)
RDW: 13.5 % (ref 11.2–14.5)
WBC: 3.7 10*3/uL — AB (ref 3.9–10.3)
lymph#: 0.9 10*3/uL (ref 0.9–3.3)

## 2015-10-09 MED ORDER — ROMIPLOSTIM 250 MCG ~~LOC~~ SOLR
2.0000 ug/kg | Freq: Once | SUBCUTANEOUS | Status: AC
  Administered 2015-10-09: 170 ug via SUBCUTANEOUS
  Filled 2015-10-09: qty 0.34

## 2015-10-09 NOTE — Telephone Encounter (Signed)
per pof to sch pt appt-gave pt copy of avs °

## 2015-10-16 ENCOUNTER — Ambulatory Visit: Payer: Medicaid Other

## 2015-10-16 ENCOUNTER — Other Ambulatory Visit: Payer: Medicaid Other

## 2015-10-17 ENCOUNTER — Other Ambulatory Visit: Payer: Medicaid Other

## 2015-10-17 ENCOUNTER — Ambulatory Visit: Payer: Medicaid Other

## 2015-10-17 ENCOUNTER — Telehealth: Payer: Self-pay | Admitting: Hematology

## 2015-10-17 NOTE — Telephone Encounter (Signed)
pt cld to r/s inj to 10/27-gave pt updated time & date

## 2015-10-18 ENCOUNTER — Other Ambulatory Visit (HOSPITAL_BASED_OUTPATIENT_CLINIC_OR_DEPARTMENT_OTHER): Payer: Medicaid Other

## 2015-10-18 ENCOUNTER — Ambulatory Visit (HOSPITAL_BASED_OUTPATIENT_CLINIC_OR_DEPARTMENT_OTHER): Payer: Medicaid Other

## 2015-10-18 VITALS — BP 119/64 | HR 90 | Temp 98.7°F

## 2015-10-18 DIAGNOSIS — D693 Immune thrombocytopenic purpura: Secondary | ICD-10-CM

## 2015-10-18 DIAGNOSIS — D696 Thrombocytopenia, unspecified: Secondary | ICD-10-CM

## 2015-10-18 DIAGNOSIS — D61818 Other pancytopenia: Secondary | ICD-10-CM

## 2015-10-18 LAB — CBC WITH DIFFERENTIAL/PLATELET
BASO%: 1.1 % (ref 0.0–2.0)
BASOS ABS: 0 10*3/uL (ref 0.0–0.1)
EOS ABS: 0.1 10*3/uL (ref 0.0–0.5)
EOS%: 3.7 % (ref 0.0–7.0)
HCT: 29.8 % — ABNORMAL LOW (ref 34.8–46.6)
HEMOGLOBIN: 10.2 g/dL — AB (ref 11.6–15.9)
LYMPH%: 18.6 % (ref 14.0–49.7)
MCH: 34.4 pg — AB (ref 25.1–34.0)
MCHC: 34.2 g/dL (ref 31.5–36.0)
MCV: 100.8 fL (ref 79.5–101.0)
MONO#: 0.4 10*3/uL (ref 0.1–0.9)
MONO%: 11.7 % (ref 0.0–14.0)
NEUT%: 64.9 % (ref 38.4–76.8)
NEUTROS ABS: 2.1 10*3/uL (ref 1.5–6.5)
PLATELETS: 215 10*3/uL (ref 145–400)
RBC: 2.96 10*6/uL — ABNORMAL LOW (ref 3.70–5.45)
RDW: 13.5 % (ref 11.2–14.5)
WBC: 3.2 10*3/uL — AB (ref 3.9–10.3)
lymph#: 0.6 10*3/uL — ABNORMAL LOW (ref 0.9–3.3)

## 2015-10-18 MED ORDER — ROMIPLOSTIM 250 MCG ~~LOC~~ SOLR
2.0000 ug/kg | Freq: Once | SUBCUTANEOUS | Status: AC
  Administered 2015-10-18: 170 ug via SUBCUTANEOUS
  Filled 2015-10-18: qty 0.34

## 2015-10-23 ENCOUNTER — Ambulatory Visit (HOSPITAL_BASED_OUTPATIENT_CLINIC_OR_DEPARTMENT_OTHER): Payer: Medicaid Other

## 2015-10-23 ENCOUNTER — Other Ambulatory Visit (HOSPITAL_BASED_OUTPATIENT_CLINIC_OR_DEPARTMENT_OTHER): Payer: Medicaid Other

## 2015-10-23 VITALS — BP 130/70 | HR 86 | Temp 98.6°F

## 2015-10-23 DIAGNOSIS — D693 Immune thrombocytopenic purpura: Secondary | ICD-10-CM

## 2015-10-23 DIAGNOSIS — D696 Thrombocytopenia, unspecified: Secondary | ICD-10-CM

## 2015-10-23 DIAGNOSIS — D61818 Other pancytopenia: Secondary | ICD-10-CM | POA: Diagnosis present

## 2015-10-23 LAB — CBC WITH DIFFERENTIAL/PLATELET
BASO%: 1.2 % (ref 0.0–2.0)
Basophils Absolute: 0 10*3/uL (ref 0.0–0.1)
EOS ABS: 0.1 10*3/uL (ref 0.0–0.5)
EOS%: 3.3 % (ref 0.0–7.0)
HEMATOCRIT: 31.1 % — AB (ref 34.8–46.6)
HGB: 10.5 g/dL — ABNORMAL LOW (ref 11.6–15.9)
LYMPH#: 0.8 10*3/uL — AB (ref 0.9–3.3)
LYMPH%: 25.8 % (ref 14.0–49.7)
MCH: 33.8 pg (ref 25.1–34.0)
MCHC: 33.8 g/dL (ref 31.5–36.0)
MCV: 99.9 fL (ref 79.5–101.0)
MONO#: 0.5 10*3/uL (ref 0.1–0.9)
MONO%: 15.9 % — ABNORMAL HIGH (ref 0.0–14.0)
NEUT#: 1.6 10*3/uL (ref 1.5–6.5)
NEUT%: 53.8 % (ref 38.4–76.8)
PLATELETS: 207 10*3/uL (ref 145–400)
RBC: 3.12 10*6/uL — ABNORMAL LOW (ref 3.70–5.45)
RDW: 13.4 % (ref 11.2–14.5)
WBC: 3.1 10*3/uL — ABNORMAL LOW (ref 3.9–10.3)

## 2015-10-23 MED ORDER — ROMIPLOSTIM 250 MCG ~~LOC~~ SOLR
85.0000 ug | Freq: Once | SUBCUTANEOUS | Status: AC
  Administered 2015-10-23: 85 ug via SUBCUTANEOUS
  Filled 2015-10-23: qty 0.17

## 2015-10-26 NOTE — Progress Notes (Signed)
[name]  HEMATOLOGY ONCOLOGY PROGRESS NOTE  Date of service:  .10/09/2015   Patient Care Team: Neale Burly, MD as PCP - General (Internal Medicine) Daneil Dolin, MD (Gastroenterology)  CC:  follow-up for thrombocytopenia   DIAGNOSIS: Thrombocytopenia this is likely related to patient's hepatitis C, hypersplenism due to splenomegaly and liver disease.cannot rule out an element of ITP.   CURRENT TREATMENT  Romiplostim 56mcg/kg to target platelet counts of 100,000 perioperatively for cervical spinal surgery for cervical myelopathy causing left upper extremity weakness.  Intend to treat preoperatively and then for at least 4 weeks postoperatively after which it could probably be held.  INTERVAL HISTORY:  Ms Gabriella Leon is here for her scheduled follow-up for thrombocytopenia after recently having undergone her C-spine surgery on 09/28/2015. She notes that her surgery was uneventful and had no issues with excessive bleeding. Still notes some left arm weakness after surgery and feels like she is swaying towards left side on walking. This was present immediate after surgery and has been evaluated by the neurosurgeon. No other new focal neurological deficits. She did miss one dose last week of her Nplate answer platelets today are somewhat lower at 116,000. We discussed that we will continue the endplate for about 6 weeks after her surgery to ensure that is no postoperative bleeding. No fevers or chills. Notes that she has done on her smoking.   REVIEW OF SYSTEMS:    10 Point review of systems of done and is negative except as noted above.  . Past Medical History  Diagnosis Date  . Diabetes mellitus   . Hypertension   . Cirrhosis ([place])   . Hepatitis B antibody positive   . Ulcer     ?  [name] Acid reflux disease with ulcer   . Depression   . Helicobacter pylori ab+     July 2010, s/p treatment  . GERD (gastroesophageal reflux disease)   . Gallstones   . Anxiety   . Blood  transfusion   . Hepatitis C     and B positive antibody  . Other pancytopenia ([place]) 10/27/2013  . Folate deficiency 03/28/2014    Noted on 03/23/2014.  Folate 1 mg prescribed.  . History of alcoholism ([place])   . Arthritis   . Liver cirrhosis ([place])   . DDD (degenerative disc disease), lumbar   . Esophageal varices ([place]) 09/18/2014  . Chronic kidney disease   . Headache     migraines  . Blood dyscrasia     thrombocytopenia-next tx injection 09/25/15    . Past Surgical History  Procedure Laterality Date  . Cesarean section  1988    [facility]  . Cholecystectomy  greater than 10 yrs    [facility]  . Appendectomy      age 59  . Esophageal biopsy  12/18/2011       . Colonoscopy  12/18/11    minimal anal canal hemorrhoids, friable rectal and colonic mucosa, left-sided diverticulosis, repeat in 2022.   [name] Esophagogastroduodenoscopy  12/18/11    3 columns of Grade II esophageal varices, reflux esohpagitis, portal gastropathy, path with chronic gastritis, negative H.pylori, surveillance in June 2014  . Multiple extractions with alveoloplasty  03/08/2012    Procedure: MULTIPLE EXTRACION WITH ALVEOLOPLASTY;  Surgeon: Gae Bon, DDS;  Location: [facility];  Service: Oral Surgery;  Laterality: N/A;  . Esophagogastroduodenoscopy (egd) with propofol N/A 04/13/2014    Dr. [name]: grade 1-2 varices, hiatal hernia, portal gastropathy. NO need for further surveillance while on prophylaxis  unless evidence of bleeding  . Anterior cervical decomp/discectomy fusion N/A 09/28/2015    Procedure: Cervical three-four Anterior cervical decompression/diskectomy/fusion;  Surgeon: Leeroy Cha, MD;  Location: [place];  Service: Neurosurgery;  Laterality: N/A;  C3-4 Anterior cervical decompression/diskectomy/fusion    . Social History  Substance Use Topics  . Smoking status: Current Every Day Smoker -- 0.50 packs/day for 20 years    Types: Cigarettes  . Smokeless tobacco: Never Used  . Alcohol Use: No     Comment:  last use 2015    ALLERGIES:  has No Known Allergies.  MEDICATIONS:  Current Outpatient Prescriptions  Medication Sig Dispense Refill  . folic acid (FOLVITE) 1 MG tablet Take 1 tablet (1 mg total) by mouth daily. 30 tablet 11  . furosemide (LASIX) 40 MG tablet Take 40 mg by mouth daily.     [name] glimepiride (AMARYL) 2 MG tablet Take 2 mg by mouth daily.     [name] HYDROcodone-acetaminophen (NORCO/VICODIN) 5-325 MG per tablet Take 1 tablet by mouth every 6 (six) hours as needed for moderate pain.    [name] ibuprofen (ADVIL,MOTRIN) 200 MG tablet Take 400 mg by mouth every 6 (six) hours as needed (pain).    [name] lisinopril-hydrochlorothiazide (PRINZIDE,ZESTORETIC) 10-12.5 MG per tablet Take 1 tablet by mouth daily.    [name] lubiprostone (AMITIZA) 24 MCG capsule Take 24 mcg by mouth 2 (two) times daily as needed for constipation.     . phytonadione (VITAMIN K) 5 MG tablet Take 1 tablet (5 mg total) by mouth daily. (Patient not taking: Reported on 09/24/2015) 10 tablet 0  . propranolol (INDERAL) 20 MG tablet Take 1 tablet (20 mg total) by mouth 2 (two) times daily. 60 tablet 3  . ranitidine (ZANTAC) 300 MG capsule Take 300 mg by mouth daily.    [name] ROMIPLOSTIM [place] Inject 1 each into the skin once a week. Injections are done at Dr. Grier Mitts office, [name] every Tuesday - next injection due 09/25/15 (70mcg/kg)    . sertraline (ZOLOFT) 25 MG tablet Take 25 mg by mouth daily.    [name] spironolactone (ALDACTONE) 50 MG tablet Take 50 mg by mouth daily.     No current facility-administered medications for this visit.    PHYSICAL EXAMINATION: ECOG PERFORMANCE STATUS: 1 - Symptomatic but completely ambulatory  Filed Vitals:   10/09/15 1003  BP: 128/68  Pulse: 72  Temp: 98.9 F (37.2 C)  Resp: 18   Filed Weights   10/09/15 1003  Weight: 188 lb 14.4 oz (85.684 kg)   .Body mass index is 32.41 kg/(m^2).  GENERAL:alert, in no acute distress and comfortable SKIN: few healing bruises on her lower  extremities EYES: normal, conjunctiva are pink and non-injected, sclera clear OROPHARYNX:no exudate, no erythema and lips, buccal mucosa, and tongue normal  NECK: supple, no JVD, thyroid normal size, non-tender, without nodularity LYMPH: no palpable lymphadenopathy in the cervical, axillary or inguinal LUNGS: clear to auscultation with normal respiratory effort HEART: regular rate & rhythm, no murmurs. ABDOMEN: abdomen soft, non-tender, normoactive bowel sounds, Palpable splenomegaly 2-3 fingerbreadths below costal margin in midclavicular line. Musculoskeletal: bilateral trace edema PSYCH: alert & oriented x 3 with fluent speech NEURO: left upper extremity 4/5 power  LABORATORY DATA:   I have reviewed the data as listed  . CBC Latest Ref Rng 10/09/2015  WBC 3.9 - 10.3 10e3/uL 3.7(L)  Hemoglobin 11.6 - 15.9 g/dL 9.9(L)  Hematocrit 34.8 - 46.6 % 29.7(L)  Platelets 145 - 400 10e3/uL 116(L)    .  CMP Latest Ref Rng 09/24/2015 08/29/2015 08/17/2015  Glucose 65 - 99 mg/dL 117(H) 100 110(H)  BUN 6 - 20 mg/dL 13 8.7 11  Creatinine 0.44 - 1.00 mg/dL 1.30(H) 1.2(H) 1.25(H)  Sodium 135 - 145 mmol/L 134(L) 137 135  Potassium 3.5 - 5.1 mmol/L 4.1 4.3 3.5  Chloride 101 - 111 mmol/L 99(L) - 101  CO2 22 - 32 mmol/L 28 26 26   Calcium 8.9 - 10.3 mg/dL 9.5 10.1 9.1  Total Protein 6.5 - 8.1 g/dL 7.2 7.9 7.2  Total Bilirubin 0.3 - 1.2 mg/dL 1.6(H) 2.03(H) 1.7(H)  Alkaline Phos 38 - 126 U/L 131(H) 114 117  AST 15 - 41 U/L 40 43(H) 48(H)  ALT 14 - 54 U/L 21 27 28      RADIOGRAPHIC STUDIES: I have personally reviewed the radiological images as listed and agreed with the findings in the report. Dg Cervical Spine 2-3 Views  09/28/2015  CLINICAL DATA:  ACDF C3-4. EXAM: CERVICAL SPINE  4+ VIEWS COMPARISON:  08/13/2015 FINDINGS: First lateral intraoperative image demonstrates an anterior localizing instrument at C3-4. Second lateral intraoperative image demonstrates changes of anterior fusion at C3-4.  No complicating feature. IMPRESSION: Anterior fusion C3-4. Electronically Signed   By: Rolm Baptise M.D.   On: 09/28/2015 10:49    ASSESSMENT & PLAN:   59 year old African American female with multiple medical comorbidities including hepatitis C, history of alcohol abuse, liver cirrhosis with portal hypertension referred to [place] for evaluation and management of  1] Thrombocytopenia this is likely related to patient's hepatitis C, hypersplenism due to splenomegaly and liver disease.cannot rule out an element of ITP. Baseline platelet counts recently appear to be around 40-50,000 these appear to have dropped further due to an intercurrent viral upper respiratory tract infection. 2] pancytopenia likely related to liver disease, hepatitis C and hypersplenism. Plan -Patient has had her C-spine surgery and had no issues with bleeding needed platelet counts have remained stable. Her platelet counts are somewhat more today due to her having missed her last week dose of Nplate. -continue weekly Romiplostim for now and for about 4-6 weeks after surgery to reduce the risk of perioperative bleeding. -patient again was counseled on complete smoking cessation to reduce the risk of thrombosis with Romiplostim and to allow for better results with her C-spine surgery and to reduce other perioperative risks associated with smoking. -Return to care in 4 weeks with Dr. Irene Limbo with CBC, CMP at which time there is no concern with bleeding will discontinue her Romiplostim  All of the patients questions were answered to her apparent satisfaction. The patient knows to call the clinic with any problems, questions or concerns.   Sullivan Lone MD [place] AAHIVMS [facility] Hematology/Oncology Physician [facility]  (Office):       [phone] (Work cell):  [phone] (Fax):           [phone]

## 2015-10-30 ENCOUNTER — Ambulatory Visit (HOSPITAL_BASED_OUTPATIENT_CLINIC_OR_DEPARTMENT_OTHER): Payer: Medicaid Other

## 2015-10-30 ENCOUNTER — Other Ambulatory Visit (HOSPITAL_BASED_OUTPATIENT_CLINIC_OR_DEPARTMENT_OTHER): Payer: Medicaid Other

## 2015-10-30 VITALS — BP 107/56 | HR 79 | Temp 98.5°F

## 2015-10-30 DIAGNOSIS — D693 Immune thrombocytopenic purpura: Secondary | ICD-10-CM | POA: Diagnosis present

## 2015-10-30 DIAGNOSIS — D696 Thrombocytopenia, unspecified: Secondary | ICD-10-CM

## 2015-10-30 LAB — CBC WITH DIFFERENTIAL/PLATELET
BASO%: 1.1 % (ref 0.0–2.0)
BASOS ABS: 0 10*3/uL (ref 0.0–0.1)
EOS ABS: 0.1 10*3/uL (ref 0.0–0.5)
EOS%: 3.2 % (ref 0.0–7.0)
HEMATOCRIT: 32.5 % — AB (ref 34.8–46.6)
HEMOGLOBIN: 10.9 g/dL — AB (ref 11.6–15.9)
LYMPH#: 0.7 10*3/uL — AB (ref 0.9–3.3)
LYMPH%: 20.6 % (ref 14.0–49.7)
MCH: 33 pg (ref 25.1–34.0)
MCHC: 33.5 g/dL (ref 31.5–36.0)
MCV: 98.7 fL (ref 79.5–101.0)
MONO#: 0.4 10*3/uL (ref 0.1–0.9)
MONO%: 10.8 % (ref 0.0–14.0)
NEUT#: 2.2 10*3/uL (ref 1.5–6.5)
NEUT%: 64.3 % (ref 38.4–76.8)
PLATELETS: 244 10*3/uL (ref 145–400)
RBC: 3.29 10*6/uL — ABNORMAL LOW (ref 3.70–5.45)
RDW: 13.4 % (ref 11.2–14.5)
WBC: 3.5 10*3/uL — ABNORMAL LOW (ref 3.9–10.3)

## 2015-10-30 MED ORDER — ROMIPLOSTIM 250 MCG ~~LOC~~ SOLR
85.0000 ug | Freq: Once | SUBCUTANEOUS | Status: AC
  Administered 2015-10-30: 85 ug via SUBCUTANEOUS
  Filled 2015-10-30: qty 0.17

## 2015-11-06 ENCOUNTER — Other Ambulatory Visit: Payer: Medicaid Other

## 2015-11-06 ENCOUNTER — Encounter: Payer: Self-pay | Admitting: Hematology

## 2015-11-06 ENCOUNTER — Ambulatory Visit (HOSPITAL_BASED_OUTPATIENT_CLINIC_OR_DEPARTMENT_OTHER): Payer: Medicaid Other | Admitting: Hematology

## 2015-11-06 ENCOUNTER — Other Ambulatory Visit (HOSPITAL_BASED_OUTPATIENT_CLINIC_OR_DEPARTMENT_OTHER): Payer: Medicaid Other

## 2015-11-06 ENCOUNTER — Telehealth: Payer: Self-pay | Admitting: Hematology

## 2015-11-06 ENCOUNTER — Ambulatory Visit: Payer: Medicaid Other

## 2015-11-06 DIAGNOSIS — D693 Immune thrombocytopenic purpura: Secondary | ICD-10-CM

## 2015-11-06 DIAGNOSIS — R161 Splenomegaly, not elsewhere classified: Secondary | ICD-10-CM | POA: Diagnosis not present

## 2015-11-06 DIAGNOSIS — B1920 Unspecified viral hepatitis C without hepatic coma: Secondary | ICD-10-CM | POA: Diagnosis not present

## 2015-11-06 DIAGNOSIS — D696 Thrombocytopenia, unspecified: Secondary | ICD-10-CM

## 2015-11-06 DIAGNOSIS — J40 Bronchitis, not specified as acute or chronic: Secondary | ICD-10-CM

## 2015-11-06 DIAGNOSIS — D731 Hypersplenism: Secondary | ICD-10-CM

## 2015-11-06 DIAGNOSIS — K769 Liver disease, unspecified: Secondary | ICD-10-CM

## 2015-11-06 DIAGNOSIS — D61818 Other pancytopenia: Secondary | ICD-10-CM | POA: Diagnosis not present

## 2015-11-06 LAB — CBC WITH DIFFERENTIAL/PLATELET
BASO%: 0.8 % (ref 0.0–2.0)
BASOS ABS: 0 10*3/uL (ref 0.0–0.1)
EOS ABS: 0.2 10*3/uL (ref 0.0–0.5)
EOS%: 3.8 % (ref 0.0–7.0)
HEMATOCRIT: 33.7 % — AB (ref 34.8–46.6)
HEMOGLOBIN: 11.1 g/dL — AB (ref 11.6–15.9)
LYMPH%: 28.1 % (ref 14.0–49.7)
MCH: 32.5 pg (ref 25.1–34.0)
MCHC: 32.9 g/dL (ref 31.5–36.0)
MCV: 98.5 fL (ref 79.5–101.0)
MONO#: 0.4 10*3/uL (ref 0.1–0.9)
MONO%: 10.3 % (ref 0.0–14.0)
NEUT%: 57 % (ref 38.4–76.8)
NEUTROS ABS: 2.3 10*3/uL (ref 1.5–6.5)
PLATELETS: 146 10*3/uL (ref 145–400)
RBC: 3.42 10*6/uL — ABNORMAL LOW (ref 3.70–5.45)
RDW: 13.7 % (ref 11.2–14.5)
WBC: 4 10*3/uL (ref 3.9–10.3)
lymph#: 1.1 10*3/uL (ref 0.9–3.3)

## 2015-11-06 MED ORDER — ROMIPLOSTIM 250 MCG ~~LOC~~ SOLR: 2.0000 ug/kg | Freq: Once | SUBCUTANEOUS | Status: DC

## 2015-11-06 MED ORDER — AZITHROMYCIN 500 MG PO TABS: 500.0000 mg | ORAL_TABLET | Freq: Every day | ORAL | Status: AC

## 2015-11-06 NOTE — Telephone Encounter (Signed)
per pof to sch pt appt-gave pt copy of avs-pt req to mail copy of avs

## 2015-11-09 ENCOUNTER — Ambulatory Visit: Payer: Medicaid Other | Admitting: Hematology

## 2015-11-09 ENCOUNTER — Other Ambulatory Visit: Payer: Medicaid Other

## 2015-11-13 ENCOUNTER — Ambulatory Visit: Payer: Medicaid Other

## 2015-11-13 ENCOUNTER — Other Ambulatory Visit: Payer: Medicaid Other

## 2015-11-20 ENCOUNTER — Ambulatory Visit: Payer: Medicaid Other

## 2015-11-20 ENCOUNTER — Other Ambulatory Visit: Payer: Medicaid Other

## 2015-11-21 NOTE — Progress Notes (Signed)
Gabriella Leon  HEMATOLOGY ONCOLOGY PROGRESS NOTE  Date of service:  .11/06/2015   Patient Care Team: Neale Burly, MD as PCP - General (Internal Medicine) Daneil Dolin, MD (Gastroenterology)  CC:  follow-up for thrombocytopenia   DIAGNOSIS: Thrombocytopenia this is likely related to patient's hepatitis C, hypersplenism due to splenomegaly and liver disease.cannot rule out an element of ITP.   CURRENT TREATMENT  Romiplostim 110mcg/kg to target platelet counts of 100,000 perioperatively for cervical spinal surgery for cervical myelopathy causing left upper extremity weakness.  Intend to treat preoperatively and then for at least 4 weeks postoperatively after which it could probably be held.  INTERVAL HISTORY:  Ms Gabriella Leon is here for her scheduled follow-up for thrombocytopenia after recently having undergone her C-spine surgery on 09/28/2015. She notes that she is doing much better with therapies and that her left arm weakness continues to improve. No issues with bleeding.   REVIEW OF SYSTEMS:    10 Point review of systems of done and is negative except as noted above.  . Past Medical History  Diagnosis Date  . Diabetes mellitus   . Hypertension   . Cirrhosis ([place])   . Hepatitis B antibody positive   . Ulcer     ?  Gabriella Leon Acid reflux disease with ulcer   . Depression   . Helicobacter pylori ab+     July 2010, s/p treatment  . GERD (gastroesophageal reflux disease)   . Gallstones   . Anxiety   . Blood transfusion   . Hepatitis C     and B positive antibody  . Other pancytopenia ([place]) 10/27/2013  . Folate deficiency 03/28/2014    Noted on 03/23/2014.  Folate 1 mg prescribed.  . History of alcoholism ([place])   . Arthritis   . Liver cirrhosis ([place])   . DDD (degenerative disc disease), lumbar   . Esophageal varices ([place]) 09/18/2014  . Chronic kidney disease   . Headache     migraines  . Blood dyscrasia     thrombocytopenia-next tx injection 09/25/15    . Past Surgical History   Procedure Laterality Date  . Cesarean section  1988    [facility]  . Cholecystectomy  greater than 10 yrs    [facility]  . Appendectomy      age 84  . Esophageal biopsy  12/18/2011       . Colonoscopy  12/18/11    minimal anal canal hemorrhoids, friable rectal and colonic mucosa, left-sided diverticulosis, repeat in 2022.   Gabriella Leon Esophagogastroduodenoscopy  12/18/11    3 columns of Grade II esophageal varices, reflux esohpagitis, portal gastropathy, path with chronic gastritis, negative H.pylori, surveillance in June 2014  . Multiple extractions with alveoloplasty  03/08/2012    Procedure: MULTIPLE EXTRACION WITH ALVEOLOPLASTY;  Surgeon: Gae Bon, DDS;  Location: [facility];  Service: Oral Surgery;  Laterality: N/A;  . Esophagogastroduodenoscopy (egd) with propofol N/A 04/13/2014    Dr. [name]: grade 1-2 varices, hiatal hernia, portal gastropathy. NO need for further surveillance while on prophylaxis unless evidence of bleeding  . Anterior cervical decomp/discectomy fusion N/A 09/28/2015    Procedure: Cervical three-four Anterior cervical decompression/diskectomy/fusion;  Surgeon: Leeroy Cha, MD;  Location: [facility];  Service: Neurosurgery;  Laterality: N/A;  C3-4 Anterior cervical decompression/diskectomy/fusion    . Social History  Substance Use Topics  . Smoking status: Current Every Day Smoker -- 0.50 packs/day for 20 years    Types: Cigarettes  . Smokeless tobacco: Never Used  . Alcohol Use:  No     Comment: last use 2015    ALLERGIES:  has No Known Allergies.  MEDICATIONS:  Current Outpatient Prescriptions  Medication Sig Dispense Refill  . folic acid (FOLVITE) 1 MG tablet Take 1 tablet (1 mg total) by mouth daily. 30 tablet 11  . furosemide (LASIX) 40 MG tablet Take 40 mg by mouth daily.     Gabriella Leon glimepiride (AMARYL) 2 MG tablet Take 2 mg by mouth daily.     Gabriella Leon HYDROcodone-acetaminophen (NORCO/VICODIN) 5-325 MG per tablet Take 1 tablet by mouth every 6 (six) hours as needed for  moderate pain.    Gabriella Leon ibuprofen (ADVIL,MOTRIN) 200 MG tablet Take 400 mg by mouth every 6 (six) hours as needed (pain).    Gabriella Leon lisinopril-hydrochlorothiazide (PRINZIDE,ZESTORETIC) 10-12.5 MG per tablet Take 1 tablet by mouth daily.    Gabriella Leon lubiprostone (AMITIZA) 24 MCG capsule Take 24 mcg by mouth 2 (two) times daily as needed for constipation.     . phytonadione (VITAMIN K) 5 MG tablet Take 1 tablet (5 mg total) by mouth daily. 10 tablet 0  . propranolol (INDERAL) 20 MG tablet Take 1 tablet (20 mg total) by mouth 2 (two) times daily. 60 tablet 3  . ranitidine (ZANTAC) 300 MG capsule Take 300 mg by mouth daily.    Gabriella Leon ROMIPLOSTIM [place] Inject 1 each into the skin once a week. Injections are done at Dr. Grier Mitts office, [name] every Tuesday - next injection due 09/25/15 (40mcg/kg)    . sertraline (ZOLOFT) 25 MG tablet Take 25 mg by mouth daily.    Gabriella Leon spironolactone (ALDACTONE) 50 MG tablet Take 50 mg by mouth daily.     No current facility-administered medications for this visit.    PHYSICAL EXAMINATION: ECOG PERFORMANCE STATUS: 1 - Symptomatic but completely ambulatory Vital signs reviewed GENERAL:alert, in no acute distress and comfortable SKIN: few healing bruises on her lower extremities EYES: normal, conjunctiva are pink and non-injected, sclera clear OROPHARYNX:no exudate, no erythema and lips, buccal mucosa, and tongue normal  NECK: supple, no JVD, thyroid normal size, non-tender, without nodularity LYMPH: no palpable lymphadenopathy in the cervical, axillary or inguinal LUNGS: clear to auscultation with normal respiratory effort HEART: regular rate & rhythm, no murmurs. ABDOMEN: abdomen soft, non-tender, normoactive bowel sounds, Palpable splenomegaly 2-3 fingerbreadths below costal margin in midclavicular line. Musculoskeletal: bilateral trace edema PSYCH: alert & oriented x 3 with fluent speech NEURO: left upper extremity 4/5 power  LABORATORY DATA:   I have reviewed the  data as listed  . CBC Latest Ref Rng 11/06/2015 10/30/2015 10/23/2015  WBC 3.9 - 10.3 10e3/uL 4.0 3.5(L) 3.1(L)  Hemoglobin 11.6 - 15.9 g/dL 11.1(L) 10.9(L) 10.5(L)  Hematocrit 34.8 - 46.6 % 33.7(L) 32.5(L) 31.1(L)  Platelets 145 - 400 10e3/uL 146 244 207    . CMP Latest Ref Rng 09/24/2015 08/29/2015 08/17/2015  Glucose 65 - 99 mg/dL 117(H) 100 110(H)  BUN 6 - 20 mg/dL 13 8.7 11  Creatinine 0.44 - 1.00 mg/dL 1.30(H) 1.2(H) 1.25(H)  Sodium 135 - 145 mmol/L 134(L) 137 135  Potassium 3.5 - 5.1 mmol/L 4.1 4.3 3.5  Chloride 101 - 111 mmol/L 99(L) - 101  CO2 22 - 32 mmol/L 28 26 26   Calcium 8.9 - 10.3 mg/dL 9.5 10.1 9.1  Total Protein 6.5 - 8.1 g/dL 7.2 7.9 7.2  Total Bilirubin 0.3 - 1.2 mg/dL 1.6(H) 2.03(H) 1.7(H)  Alkaline Phos 38 - 126 U/L 131(H) 114 117  AST 15 - 41 U/L 40 43(H) 48(H)  ALT 14 - 54 U/L 21 27 28      RADIOGRAPHIC STUDIES: I have personally reviewed the radiological images as listed and agreed with the findings in the report. No results found.  ASSESSMENT & PLAN:   59 year old African American female with multiple medical comorbidities including hepatitis C, history of alcohol abuse, liver cirrhosis with portal hypertension referred to [place] for evaluation and management of  1] Thrombocytopenia this is likely related to patient's hepatitis C, hypersplenism due to splenomegaly and liver disease.cannot rule out an element of ITP. Baseline platelet counts recently appear to be around 40-50,000 these appear to have dropped further due to an intercurrent viral upper respiratory tract infection. 2] pancytopenia likely related to liver disease, hepatitis C and hypersplenism. Plan -Patient is about 6 weeks out from her spine surgery and has been doing well with no concerns of bleeding. -Platelet counts today are stable at 146k -We discussed the fact that we will will stop her Romiplostim since she does not require the significantly higher target on platelet counts that she needed  for surgery. -Has been following up with [facility] for evaluation and management of hepatitis C. -Counseled on absolute alcohol cessation. Counseled on smoking cessation.  Return to care in 8 weeks with Dr. Irene Limbo with CBC, CMP to monitor platelet counts off Romiplostim. Earlier follow-up if any acute new concerns related to her thrombocytopenia. . Orders Placed This Encounter  Procedures  . CBC & Diff and Retic    Standing Status: Future     Number of Occurrences:      Standing Expiration Date: 12/10/2016  . Comprehensive metabolic panel    Standing Status: Future     Number of Occurrences:      Standing Expiration Date: 12/10/2016     Sullivan Lone MD [place] AAHIVMS [facility] Hematology/Oncology Physician [place]  (Office):       [phone] (Work cell):  [phone] (Fax):           [phone]

## 2016-01-07 ENCOUNTER — Other Ambulatory Visit: Payer: Medicaid Other

## 2016-01-07 ENCOUNTER — Ambulatory Visit: Payer: Medicaid Other | Admitting: Hematology

## 2016-01-15 ENCOUNTER — Telehealth: Payer: Self-pay | Admitting: Internal Medicine

## 2016-01-15 NOTE — Telephone Encounter (Signed)
RECALL FOR RUQ ULTRASOUND  °

## 2016-01-15 NOTE — Telephone Encounter (Signed)
Mailed letter °

## 2016-01-16 ENCOUNTER — Telehealth: Payer: Self-pay | Admitting: Hematology

## 2016-01-16 ENCOUNTER — Encounter: Payer: Self-pay | Admitting: Hematology

## 2016-01-16 ENCOUNTER — Ambulatory Visit (HOSPITAL_BASED_OUTPATIENT_CLINIC_OR_DEPARTMENT_OTHER): Payer: Medicaid Other | Admitting: Hematology

## 2016-01-16 ENCOUNTER — Other Ambulatory Visit (HOSPITAL_BASED_OUTPATIENT_CLINIC_OR_DEPARTMENT_OTHER): Payer: Medicaid Other

## 2016-01-16 VITALS — BP 122/59 | HR 74 | Temp 98.2°F | Resp 20 | Ht 64.0 in | Wt 174.2 lb

## 2016-01-16 DIAGNOSIS — K7460 Unspecified cirrhosis of liver: Secondary | ICD-10-CM | POA: Diagnosis not present

## 2016-01-16 DIAGNOSIS — D696 Thrombocytopenia, unspecified: Secondary | ICD-10-CM | POA: Diagnosis present

## 2016-01-16 DIAGNOSIS — D731 Hypersplenism: Secondary | ICD-10-CM | POA: Diagnosis not present

## 2016-01-16 DIAGNOSIS — D693 Immune thrombocytopenic purpura: Secondary | ICD-10-CM

## 2016-01-16 DIAGNOSIS — B1920 Unspecified viral hepatitis C without hepatic coma: Secondary | ICD-10-CM

## 2016-01-16 DIAGNOSIS — D61818 Other pancytopenia: Secondary | ICD-10-CM

## 2016-01-16 LAB — CBC & DIFF AND RETIC
BASO%: 0.6 % (ref 0.0–2.0)
Basophils Absolute: 0 10*3/uL (ref 0.0–0.1)
EOS%: 4.7 % (ref 0.0–7.0)
Eosinophils Absolute: 0.2 10*3/uL (ref 0.0–0.5)
HCT: 32.2 % — ABNORMAL LOW (ref 34.8–46.6)
HGB: 11 g/dL — ABNORMAL LOW (ref 11.6–15.9)
Immature Retic Fract: 3.7 % (ref 1.60–10.00)
LYMPH#: 0.9 10*3/uL (ref 0.9–3.3)
LYMPH%: 27 % (ref 14.0–49.7)
MCH: 33.6 pg (ref 25.1–34.0)
MCHC: 34.2 g/dL (ref 31.5–36.0)
MCV: 98.5 fL (ref 79.5–101.0)
MONO#: 0.4 10*3/uL (ref 0.1–0.9)
MONO%: 11.9 % (ref 0.0–14.0)
NEUT%: 55.8 % (ref 38.4–76.8)
NEUTROS ABS: 1.8 10*3/uL (ref 1.5–6.5)
NRBC: 0 % (ref 0–0)
PLATELETS: 48 10*3/uL — AB (ref 145–400)
RBC: 3.27 10*6/uL — AB (ref 3.70–5.45)
RDW: 14.3 % (ref 11.2–14.5)
RETIC %: 3.08 % — AB (ref 0.70–2.10)
RETIC CT ABS: 100.72 10*3/uL — AB (ref 33.70–90.70)
WBC: 3.2 10*3/uL — AB (ref 3.9–10.3)

## 2016-01-16 LAB — COMPREHENSIVE METABOLIC PANEL
ALT: 18 U/L (ref 0–55)
ANION GAP: 10 meq/L (ref 3–11)
AST: 36 U/L — ABNORMAL HIGH (ref 5–34)
Albumin: 3.5 g/dL (ref 3.5–5.0)
Alkaline Phosphatase: 138 U/L (ref 40–150)
BUN: 12.1 mg/dL (ref 7.0–26.0)
CHLORIDE: 98 meq/L (ref 98–109)
CO2: 30 meq/L — AB (ref 22–29)
CREATININE: 1.3 mg/dL — AB (ref 0.6–1.1)
Calcium: 9.3 mg/dL (ref 8.4–10.4)
EGFR: 53 mL/min/{1.73_m2} — ABNORMAL LOW (ref >90)
Glucose: 142 mg/dl — ABNORMAL HIGH (ref 70–140)
Potassium: 3.7 mEq/L (ref 3.5–5.1)
SODIUM: 138 meq/L (ref 136–145)
Total Bilirubin: 2.05 mg/dL — ABNORMAL HIGH (ref 0.20–1.20)
Total Protein: 7.2 g/dL (ref 6.4–8.3)

## 2016-01-16 NOTE — Telephone Encounter (Signed)
Pt confirmed labs/ov per 01/25 POF, gave pt AVS and Calendar..... KJ °

## 2016-01-17 NOTE — Progress Notes (Signed)
Gabriella Leon  HEMATOLOGY ONCOLOGY PROGRESS NOTE  Date of service:  .   Patient Care Team: Neale Burly, MD as PCP - General (Internal Medicine) Daneil Dolin, MD (Gastroenterology)  CC:  follow-up for thrombocytopenia   DIAGNOSIS: Thrombocytopenia this is likely related to patient's hepatitis C, hypersplenism due to splenomegaly and liver disease.cannot rule out an element of ITP.   CURRENT TREATMENT  Observation  Previous treatment  Romiplostim 37mcg/kg to target platelet counts of 100,000 perioperatively for cervical spinal surgery for cervical myelopathy causing left upper extremity weakness. Treated preoperatively and then for at least 4 weeks postoperatively   INTERVAL HISTORY:  Gabriella Leon is here for her scheduled follow-up for thrombocytopenia.she notes no issues with bruising or bleeding.   He has healed well from surgery.  Left-sided weakness completely resolved. She notes that she has an upper respiratory tract infection and is taking Z-Pak.  No other acute new concerns.  REVIEW OF SYSTEMS:    10 Point review of systems of done and is negative except as noted above.  . Past Medical History  Diagnosis Date  . Diabetes mellitus   . Hypertension   . Cirrhosis ([place])   . Hepatitis B antibody positive   . Ulcer     ?  Gabriella Leon Acid reflux disease with ulcer   . Depression   . Helicobacter pylori ab+     July 2010, s/p treatment  . GERD (gastroesophageal reflux disease)   . Gallstones   . Anxiety   . Blood transfusion   . Hepatitis C     and B positive antibody  . Other pancytopenia ([place]) 10/27/2013  . Folate deficiency 03/28/2014    Noted on 03/23/2014.  Folate 1 mg prescribed.  . History of alcoholism ([place])   . Arthritis   . Liver cirrhosis ([place])   . DDD (degenerative disc disease), lumbar   . Esophageal varices ([place]) 09/18/2014  . Chronic kidney disease   . Headache     migraines  . Blood dyscrasia     thrombocytopenia-next tx injection 09/25/15    . Past  Surgical History  Procedure Laterality Date  . Cesarean section  1988    [facility]  . Cholecystectomy  greater than 10 yrs    [facility]  . Appendectomy      age 14  . Esophageal biopsy  12/18/2011       . Colonoscopy  12/18/11    minimal anal canal hemorrhoids, friable rectal and colonic mucosa, left-sided diverticulosis, repeat in 2022.   Gabriella Leon Esophagogastroduodenoscopy  12/18/11    3 columns of Grade II esophageal varices, reflux esohpagitis, portal gastropathy, path with chronic gastritis, negative H.pylori, surveillance in June 2014  . Multiple extractions with alveoloplasty  03/08/2012    Procedure: MULTIPLE EXTRACION WITH ALVEOLOPLASTY;  Surgeon: Gae Bon, DDS;  Location: [facility];  Service: Oral Surgery;  Laterality: N/A;  . Esophagogastroduodenoscopy (egd) with propofol N/A 04/13/2014    Dr. [name]: grade 1-2 varices, hiatal hernia, portal gastropathy. NO need for further surveillance while on prophylaxis unless evidence of bleeding  . Anterior cervical decomp/discectomy fusion N/A 09/28/2015    Procedure: Cervical three-four Anterior cervical decompression/diskectomy/fusion;  Surgeon: Leeroy Cha, MD;  Location: [facility];  Service: Neurosurgery;  Laterality: N/A;  C3-4 Anterior cervical decompression/diskectomy/fusion    . Social History  Substance Use Topics  . Smoking status: Current Every Day Smoker -- 0.50 packs/day for 20 years    Types: Cigarettes  . Smokeless tobacco: Never Used  .  Alcohol Use: No     Comment: last use 2015    ALLERGIES:  has No Known Allergies.  MEDICATIONS:  Current Outpatient Prescriptions  Medication Sig Dispense Refill  . folic acid (FOLVITE) 1 MG tablet Take 1 tablet (1 mg total) by mouth daily. 30 tablet 11  . furosemide (LASIX) 40 MG tablet Take 40 mg by mouth daily.     Gabriella Leon glimepiride (AMARYL) 2 MG tablet Take 2 mg by mouth daily.     Gabriella Leon HYDROcodone-acetaminophen (NORCO/VICODIN) 5-325 MG per tablet Take 1 tablet by mouth every 6 (six) hours  as needed for moderate pain.    Gabriella Leon ibuprofen (ADVIL,MOTRIN) 200 MG tablet Take 400 mg by mouth every 6 (six) hours as needed (pain).    Gabriella Leon lisinopril-hydrochlorothiazide (PRINZIDE,ZESTORETIC) 10-12.5 MG per tablet Take 1 tablet by mouth daily.    Gabriella Leon lubiprostone (AMITIZA) 24 MCG capsule Take 24 mcg by mouth 2 (two) times daily as needed for constipation.     . phytonadione (VITAMIN K) 5 MG tablet Take 1 tablet (5 mg total) by mouth daily. 10 tablet 0  . propranolol (INDERAL) 20 MG tablet Take 1 tablet (20 mg total) by mouth 2 (two) times daily. 60 tablet 3  . ranitidine (ZANTAC) 300 MG capsule Take 300 mg by mouth daily.    Gabriella Leon ROMIPLOSTIM [place] Inject 1 each into the skin once a week. Injections are done at Dr. Grier Mitts office, [name] every Tuesday - next injection due 09/25/15 (42mcg/kg)    . sertraline (ZOLOFT) 25 MG tablet Take 25 mg by mouth daily.    Gabriella Leon spironolactone (ALDACTONE) 50 MG tablet Take 50 mg by mouth daily.     No current facility-administered medications for this visit.    PHYSICAL EXAMINATION: ECOG PERFORMANCE STATUS: 1 - Symptomatic but completely ambulatory Vital signs reviewed GENERAL:alert, in no acute distress and comfortable SKIN: few healing bruises on her lower extremities EYES: normal, conjunctiva are pink and non-injected, sclera clear OROPHARYNX:no exudate, no erythema and lips, buccal mucosa, and tongue normal  NECK: supple, no JVD, thyroid normal size, non-tender, without nodularity LYMPH: no palpable lymphadenopathy in the cervical, axillary or inguinal LUNGS: clear to auscultation with normal respiratory effort HEART: regular rate & rhythm, no murmurs. ABDOMEN: abdomen soft, non-tender, normoactive bowel sounds, Palpable splenomegaly 2-3 fingerbreadths below costal margin in midclavicular line. Musculoskeletal: bilateral trace edema PSYCH: alert & oriented x 3 with fluent speech NEURO: left upper extremity 5/5 power  LABORATORY DATA:   I  have reviewed the data as listed  . CBC Latest Ref Rng 01/16/2016 11/06/2015 10/30/2015  WBC 3.9 - 10.3 10e3/uL 3.2(L) 4.0 3.5(L)  Hemoglobin 11.6 - 15.9 g/dL 11.0(L) 11.1(L) 10.9(L)  Hematocrit 34.8 - 46.6 % 32.2(L) 33.7(L) 32.5(L)  Platelets 145 - 400 10e3/uL 48(L) 146 244    . CMP Latest Ref Rng 01/16/2016 09/24/2015 08/29/2015  Glucose 70 - 140 mg/dl 142(H) 117(H) 100  BUN 7.0 - 26.0 mg/dL 12.1 13 8.7  Creatinine 0.6 - 1.1 mg/dL 1.3(H) 1.30(H) 1.2(H)  Sodium 136 - 145 mEq/L 138 134(L) 137  Potassium 3.5 - 5.1 mEq/L 3.7 4.1 4.3  Chloride 101 - 111 mmol/L - 99(L) -  CO2 22 - 29 mEq/L 30(H) 28 26  Calcium 8.4 - 10.4 mg/dL 9.3 9.5 10.1  Total Protein 6.4 - 8.3 g/dL 7.2 7.2 7.9  Total Bilirubin 0.20 - 1.20 mg/dL 2.05(H) 1.6(H) 2.03(H)  Alkaline Phos 40 - 150 U/L 138 131(H) 114  AST 5 - 34 U/L 36(H)  40 43(H)  ALT 0 - 55 U/L 18 21 27      RADIOGRAPHIC STUDIES: I have personally reviewed the radiological images as listed and agreed with the findings in the report. No results found.  ASSESSMENT & PLAN:   60 year old African American female with multiple medical comorbidities including hepatitis C, history of alcohol abuse, liver cirrhosis with portal hypertension referred to [place] for evaluation and management of  1] Thrombocytopenia this is likely related to patient's hepatitis C, hypersplenism due to splenomegaly and liver disease.cannot rule out an element of ITP. Baseline platelet counts recently appear to be around 40-50,000 these appear to have dropped further due to an intercurrent viral upper respiratory tract infection. 2] pancytopenia likely related to liver disease, hepatitis C and hypersplenism. Patient's platelet counts are pretty close to her baseline today at 48,000.  She has no issues with easy bruising or bleeding. Plan -no indication for acute treatment of her thrombocytopenia at this time. -If there is any issues with bleeding, platelet count dropped to below 30,000 or  patient needs to have surgery we might need to restart her treatment on Nplate. -continue following up with [facility] for evaluation and management of hepatitis C. -absolute alcohol cessation and smoking cessation.  Return to care in 6 months with Dr. Irene Limbo with CBC, CMP to monitor platelet counts. Earlier if any new concerns of need for surgery or if platelet drop below 30k or she has issues with bleeding. . Orders Placed This Encounter  Procedures  . CBC & Diff and Retic    Standing Status: Future     Number of Occurrences:      Standing Expiration Date: 02/19/2017  . Comprehensive metabolic panel    Standing Status: Future     Number of Occurrences:      Standing Expiration Date: 01/15/2017     Sullivan Lone MD [place] AAHIVMS [facility] Hematology/Oncology Physician [place]  (Office):       [phone] (Work cell):  [phone] (Fax):           [phone]

## 2016-02-04 ENCOUNTER — Ambulatory Visit: Payer: Medicaid Other | Admitting: Nurse Practitioner

## 2016-02-11 ENCOUNTER — Telehealth: Payer: Self-pay | Admitting: Internal Medicine

## 2016-02-11 ENCOUNTER — Other Ambulatory Visit: Payer: Self-pay

## 2016-02-11 DIAGNOSIS — K7460 Unspecified cirrhosis of liver: Secondary | ICD-10-CM

## 2016-02-11 NOTE — Telephone Encounter (Signed)
Pt received a letter from [facility] about scheduling her U/S and she prefers it to be around 10am. Please call her at [phone]

## 2016-02-11 NOTE — Telephone Encounter (Signed)
Pt is aware of appointment 

## 2016-02-11 NOTE — Telephone Encounter (Signed)
Pt is set up for [place] on 02/19/16 @ 10:15 am.

## 2016-02-14 ENCOUNTER — Encounter: Payer: Self-pay | Admitting: Nurse Practitioner

## 2016-02-14 ENCOUNTER — Ambulatory Visit (INDEPENDENT_AMBULATORY_CARE_PROVIDER_SITE_OTHER): Payer: Medicaid Other | Admitting: Nurse Practitioner

## 2016-02-14 VITALS — BP 129/77 | HR 69 | Temp 97.8°F | Ht 64.0 in | Wt 172.2 lb

## 2016-02-14 DIAGNOSIS — K7460 Unspecified cirrhosis of liver: Secondary | ICD-10-CM | POA: Diagnosis not present

## 2016-02-14 DIAGNOSIS — B182 Chronic viral hepatitis C: Secondary | ICD-10-CM

## 2016-02-14 DIAGNOSIS — R7989 Other specified abnormal findings of blood chemistry: Secondary | ICD-10-CM | POA: Diagnosis not present

## 2016-02-14 NOTE — Patient Instructions (Signed)
1. Have your labs drawn when you're able to. 2. Keep your ultrasound appointment next week. 3. Return for follow-up in 6 months for routine cirrhosis care. 4. Call us of any acute changes and you to see [place] sooner.

## 2016-02-14 NOTE — Assessment & Plan Note (Signed)
History of elevated ferritin, hemachromatosis labs were ordered but not drawn at last visit. We will recheck her ferritin and hemachromatosis genetics. Return for follow-up in 6 months for routine cirrhosis care.

## 2016-02-14 NOTE — Progress Notes (Addendum)
Referring Provider: Neale Burly, MD Primary Care Physician:  Neale Burly, MD Primary GI:  Dr. [name]  Chief Complaint  Patient presents with  . Follow-up    HPI:   Gabriella Leon is a 60 y.o. female who presents for follow-up on cirrhosis related to hepatitis C infection. She was last seen in our office on 08/02/2015 for the same. She is status post completion of Harvoni treatment for genotype 1 hepatitis C in June 2016. At that time there was concern for recurrent alcohol use. Noted elevated iron on labs just prior to her visit but doubtful hemachromatosis. Patient confirms she is not on iron supplementation and not drinking alcohol. Hemachromatosis labs were ordered. Labs ordered at that visit include CBC, PTT/INR, HSP, hepatitis C RNA Quant, hemachromatosis genetics. These appeared would not have been drawn. Her recent CBC done 01/16/2016 showed hemoglobin slightly depressed at 11.0, CMP at the same time showed total bili of 2.05, creatinine 1.3, AST slightly elevated at 36, normal ALT and albumin. Ultrasound of the abdomen has already been ordered and scheduled for 02/19/2016.  Today she states she is doing ok, but has a cold and doesn't feel well. Denies abdominal pain, n/V, hematochezia, melena, LE edema, abdominal distension, yellowing of skin or eyes, darkened urine, generalized pruritis, acute episodic confusion. Denies chest pain, dyspnea, dizziness, lightheadedness, syncope, near syncope. Denies any other upper or lower GI symptoms. Did have some depression and was placed on antidepressant which she feels is helping, still taking it.  Past Medical History  Diagnosis Date  . Diabetes mellitus   . Hypertension   . Cirrhosis ([place])   . Hepatitis B antibody positive   . Ulcer     ?  [name] Acid reflux disease with ulcer   . Depression   . Helicobacter pylori ab+     July 2010, s/p treatment  . GERD (gastroesophageal reflux disease)   . Gallstones   . Anxiety   . Blood  transfusion   . Hepatitis C     and B positive antibody  . Other pancytopenia ([place]) 10/27/2013  . Folate deficiency 03/28/2014    Noted on 03/23/2014.  Folate 1 mg prescribed.  . History of alcoholism ([place])   . Arthritis   . Liver cirrhosis ([place])   . DDD (degenerative disc disease), lumbar   . Esophageal varices ([place]) 09/18/2014  . Chronic kidney disease   . Headache     migraines  . Blood dyscrasia     thrombocytopenia-next tx injection 09/25/15    Past Surgical History  Procedure Laterality Date  . Cesarean section  1988    [facility]  . Cholecystectomy  greater than 10 yrs    [facility]  . Appendectomy      age 89  . Biopsy  12/18/2011       . Colonoscopy  12/18/11    minimal anal canal hemorrhoids, friable rectal and colonic mucosa, left-sided diverticulosis, repeat in 2022.   [name] Esophagogastroduodenoscopy  12/18/11    3 columns of Grade II esophageal varices, reflux esohpagitis, portal gastropathy, path with chronic gastritis, negative H.pylori, surveillance in June 2014  . Multiple extractions with alveoloplasty  03/08/2012    Procedure: MULTIPLE EXTRACION WITH ALVEOLOPLASTY;  Surgeon: Gae Bon, DDS;  Location: [facility];  Service: Oral Surgery;  Laterality: N/A;  . Esophagogastroduodenoscopy (egd) with propofol N/A 04/13/2014    Dr. [name]: grade 1-2 varices, hiatal hernia, portal gastropathy. NO need for further surveillance while on prophylaxis unless evidence  of bleeding  . Anterior cervical decomp/discectomy fusion N/A 09/28/2015    Procedure: Cervical three-four Anterior cervical decompression/diskectomy/fusion;  Surgeon: Leeroy Cha, MD;  Location: [facility];  Service: Neurosurgery;  Laterality: N/A;  C3-4 Anterior cervical decompression/diskectomy/fusion    Current Outpatient Prescriptions  Medication Sig Dispense Refill  . folic acid (FOLVITE) 1 MG tablet Take 1 tablet (1 mg total) by mouth daily. 30 tablet 11  . furosemide (LASIX) 40 MG tablet Take 40 mg by mouth daily.      [name] glimepiride (AMARYL) 2 MG tablet Take 2 mg by mouth daily.     [name] HYDROcodone-acetaminophen (NORCO/VICODIN) 5-325 MG per tablet Take 1 tablet by mouth every 6 (six) hours as needed for moderate pain.    [name] ibuprofen (ADVIL,MOTRIN) 200 MG tablet Take 400 mg by mouth every 6 (six) hours as needed (pain).    [name] lisinopril-hydrochlorothiazide (PRINZIDE,ZESTORETIC) 10-12.5 MG per tablet Take 1 tablet by mouth daily.    [name] lubiprostone (AMITIZA) 24 MCG capsule Take 24 mcg by mouth 2 (two) times daily as needed for constipation.     . phytonadione (VITAMIN K) 5 MG tablet Take 1 tablet (5 mg total) by mouth daily. 10 tablet 0  . propranolol (INDERAL) 20 MG tablet Take 1 tablet (20 mg total) by mouth 2 (two) times daily. 60 tablet 3  . ranitidine (ZANTAC) 300 MG capsule Take 300 mg by mouth daily.    [name] ROMIPLOSTIM [place] Inject 1 each into the skin once a week. Injections are done at Dr. Grier Mitts office, [name] every Tuesday - next injection due 09/25/15 (19mcg/kg)    . sertraline (ZOLOFT) 25 MG tablet Take 25 mg by mouth daily.    [name] spironolactone (ALDACTONE) 50 MG tablet Take 50 mg by mouth daily.     No current facility-administered medications for this visit.    Allergies as of 02/14/2016  . (No Known Allergies)    Family History  Problem Relation Age of Onset  . Diabetes Mother   . Cancer Paternal Uncle     passed away age 42  . Anesthesia problems Neg Hx   . Hypotension Neg Hx   . Malignant hyperthermia Neg Hx   . Pseudochol deficiency Neg Hx     Social History   Social History  . Marital Status: Legally Separated    Spouse Name: N/A  . Number of Children: 1  . Years of Education: N/A   Occupational History  . disabled    Social History Main Topics  . Smoking status: Current Every Day Smoker -- 0.50 packs/day for 20 years    Types: Cigarettes  . Smokeless tobacco: Never Used  . Alcohol Use: No     Comment: last use 2015  . Drug Use: No  . Sexual Activity: Yes      Birth Control/ Protection: Post-menopausal   Other Topics Concern  . None   Social History Narrative    Review of Systems: General: Negative for anorexia, weight loss, fever, chills, fatigue, weakness. ENT: Negative for hoarseness, difficulty swallowing. CV: Negative for chest pain, angina, palpitations, peripheral edema.  Respiratory: Negative for dyspnea at rest, cough, sputum, wheezing.  GI: See history of present illness. Derm: Negative for rash or itching.  Psych: Noted improved depression on antidepressant.  Endo: Negative for unusual weight change.  Heme: Negative for bruising or bleeding.   Physical Exam: BP 129/77 mmHg  Pulse 69  Temp(Src) 97.8 F (36.6 C) (Oral)  Ht 5\' 4"  (1.626  m)  Wt 172 lb 3.2 oz (78.109 kg)  BMI 29.54 kg/m2 General:   Alert and oriented. Pleasant and cooperative. Well-nourished and well-developed.  Head:  Normocephalic and atraumatic. Eyes:  Without icterus, sclera clear and conjunctiva pink.  Ears:  Normal auditory acuity. Cardiovascular:  S1, S2 present without murmurs appreciated. Extremities without clubbing or edema. Respiratory:  Clear to auscultation bilaterally. No wheezes, rales, or rhonchi. No distress.  Gastrointestinal:  +BS, soft, non-tender and non-distended. Liver appreciated 3 fingerbreadths below the right costal margin. No guarding or rebound. No masses appreciated.  Rectal:  Deferred  Musculoskalatal:  Symmetrical without gross deformities. Skin:  Intact without significant lesions or rashes. Neurologic:  Alert and oriented x4;  grossly normal neurologically. Psych:  Alert and cooperative. Normal mood and affect. Heme/Lymph/Immune: No excessive bruising noted.    02/14/2016 2:14 PM

## 2016-02-14 NOTE — Progress Notes (Signed)
CC'D TO PCP °

## 2016-02-14 NOTE — Assessment & Plan Note (Signed)
Patient with cirrhosis due to hepatitis C. She completed her hepatitis C treatment last June. At her last office visit follow-up hepatitis C RNA viral load was ordered along with hemachromatosis genetics due to elevated ferritin. These were not drawn. At this point she is generally asymptomatic from a liver standpoint. We will update her labs with CBC, CMP, PTT/INR, reorder the hemachromatosis genetics and hep C viral load. We'll also recheck her ferritin. Updated imaging RD scheduled for next week. Return for follow-up in 6 months or sooner if any acute changes.

## 2016-02-19 ENCOUNTER — Ambulatory Visit (HOSPITAL_COMMUNITY): Admission: RE | Admit: 2016-02-19 | Payer: Medicaid Other | Source: Ambulatory Visit

## 2016-02-25 ENCOUNTER — Ambulatory Visit (HOSPITAL_COMMUNITY)
Admission: RE | Admit: 2016-02-25 | Discharge: 2016-02-25 | Disposition: A | Discharge status: Home / Self Care | Payer: Medicaid Other | Source: Ambulatory Visit | Attending: Gastroenterology | Admitting: Gastroenterology

## 2016-02-25 DIAGNOSIS — K7460 Unspecified cirrhosis of liver: Secondary | ICD-10-CM | POA: Insufficient documentation

## 2016-02-26 ENCOUNTER — Telehealth: Payer: Self-pay | Admitting: Internal Medicine

## 2016-02-26 NOTE — Telephone Encounter (Signed)
Routing to [facility]SL 

## 2016-02-26 NOTE — Telephone Encounter (Signed)
Pt had ultrasound done yesterday and called today for results. I told her it would be 7-10 business days before results are available and I would let the nurse be aware that she had called. CV:[id number]

## 2016-02-27 NOTE — Progress Notes (Signed)
Quick Note:  . ______ 

## 2016-02-27 NOTE — Progress Notes (Signed)
Quick Note:  Please let patient know her u/s is stable. No evidence of liver masses, known cirrhosis. Please NIC for ruq u/s in six months.  Please encourage patient to have her labs done. I also want her to have Hep B surf Antibody done when she has Eric's labs done. ______

## 2016-02-27 NOTE — Telephone Encounter (Signed)
See result note.  

## 2016-02-28 ENCOUNTER — Other Ambulatory Visit: Payer: Self-pay | Admitting: Gastroenterology

## 2016-02-28 ENCOUNTER — Other Ambulatory Visit: Payer: Self-pay

## 2016-02-28 DIAGNOSIS — K7469 Other cirrhosis of liver: Secondary | ICD-10-CM

## 2016-03-27 NOTE — Progress Notes (Signed)
Quick Note:  Have we seen these labs? Done at [facility]. ______

## 2016-04-01 ENCOUNTER — Telehealth: Payer: Self-pay | Admitting: Internal Medicine

## 2016-04-01 NOTE — Telephone Encounter (Signed)
I called [facility], they do not have the labs we ordered. They only have labs from Dr.Befekadu. They also have the pt under a different name than we do in our system. I called the pt. I am mailing the lab orders to her and she will go by and have them done. Lab orders in the mail to the pt.

## 2016-04-01 NOTE — Telephone Encounter (Signed)
PLEASE CALL PATIENT IF LABS ARE COMPLETE

## 2016-04-02 ENCOUNTER — Telehealth: Payer: Self-pay | Admitting: Gastroenterology

## 2016-04-02 NOTE — Telephone Encounter (Signed)
Nephrology called and stated her iron levels are increasing. He is referring to Hematology. Agree with this. We will see her in Aug 2017.

## 2016-04-07 NOTE — Telephone Encounter (Signed)
yes

## 2016-04-07 NOTE — Telephone Encounter (Signed)
Are these the labs Randall Hiss ordered in 01/2016?

## 2016-04-08 NOTE — Telephone Encounter (Signed)
Noted, thanks!

## 2016-04-11 ENCOUNTER — Other Ambulatory Visit: Payer: Self-pay | Admitting: *Deleted

## 2016-04-11 ENCOUNTER — Telehealth: Payer: Self-pay | Admitting: Hematology

## 2016-04-11 NOTE — Telephone Encounter (Signed)
Spoke to patient about 4/24 appt date/time. Pt wanted a nurse to explain why she needed to come in earlier than July. [place] message to desk nurse to call pt

## 2016-04-14 ENCOUNTER — Telehealth: Payer: Self-pay | Admitting: Hematology

## 2016-04-14 ENCOUNTER — Other Ambulatory Visit (HOSPITAL_BASED_OUTPATIENT_CLINIC_OR_DEPARTMENT_OTHER): Payer: Medicaid Other

## 2016-04-14 ENCOUNTER — Other Ambulatory Visit: Payer: Medicaid Other

## 2016-04-14 ENCOUNTER — Encounter: Payer: Self-pay | Admitting: Hematology

## 2016-04-14 ENCOUNTER — Ambulatory Visit: Payer: Medicaid Other | Admitting: Hematology

## 2016-04-14 ENCOUNTER — Ambulatory Visit (HOSPITAL_BASED_OUTPATIENT_CLINIC_OR_DEPARTMENT_OTHER): Payer: Medicaid Other | Admitting: Hematology

## 2016-04-14 VITALS — BP 155/71 | HR 55 | Temp 98.3°F | Resp 18 | Ht 64.0 in | Wt 171.6 lb

## 2016-04-14 DIAGNOSIS — R7989 Other specified abnormal findings of blood chemistry: Secondary | ICD-10-CM | POA: Diagnosis not present

## 2016-04-14 DIAGNOSIS — Z720 Tobacco use: Secondary | ICD-10-CM | POA: Diagnosis not present

## 2016-04-14 DIAGNOSIS — D696 Thrombocytopenia, unspecified: Secondary | ICD-10-CM

## 2016-04-14 DIAGNOSIS — E559 Vitamin D deficiency, unspecified: Secondary | ICD-10-CM

## 2016-04-14 DIAGNOSIS — D61818 Other pancytopenia: Secondary | ICD-10-CM

## 2016-04-14 LAB — CBC & DIFF AND RETIC
BASO%: 1.4 % (ref 0.0–2.0)
Basophils Absolute: 0 10*3/uL (ref 0.0–0.1)
EOS%: 5.3 % (ref 0.0–7.0)
Eosinophils Absolute: 0.1 10*3/uL (ref 0.0–0.5)
HCT: 34.2 % — ABNORMAL LOW (ref 34.8–46.6)
HGB: 11.6 g/dL (ref 11.6–15.9)
Immature Retic Fract: 3.5 % (ref 1.60–10.00)
LYMPH#: 0.7 10*3/uL — AB (ref 0.9–3.3)
LYMPH%: 32.1 % (ref 14.0–49.7)
MCH: 34.7 pg — AB (ref 25.1–34.0)
MCHC: 33.9 g/dL (ref 31.5–36.0)
MCV: 102.4 fL — ABNORMAL HIGH (ref 79.5–101.0)
MONO#: 0.3 10*3/uL (ref 0.1–0.9)
MONO%: 12.9 % (ref 0.0–14.0)
NEUT%: 48.3 % (ref 38.4–76.8)
NEUTROS ABS: 1 10*3/uL — AB (ref 1.5–6.5)
Platelets: 49 10*3/uL — ABNORMAL LOW (ref 145–400)
RBC: 3.34 10*6/uL — AB (ref 3.70–5.45)
RDW: 13.5 % (ref 11.2–14.5)
RETIC %: 2.74 % — AB (ref 0.70–2.10)
RETIC CT ABS: 91.52 10*3/uL — AB (ref 33.70–90.70)
WBC: 2.1 10*3/uL — AB (ref 3.9–10.3)

## 2016-04-14 LAB — IRON AND TIBC
%SAT: 57 % (ref 21–57)
Iron: 199 ug/dL — ABNORMAL HIGH (ref 41–142)
TIBC: 348 ug/dL (ref 236–444)
UIBC: 149 ug/dL (ref 120–384)

## 2016-04-14 LAB — COMPREHENSIVE METABOLIC PANEL
ALT: 24 U/L (ref 0–55)
AST: 50 U/L — AB (ref 5–34)
Albumin: 3.6 g/dL (ref 3.5–5.0)
Alkaline Phosphatase: 138 U/L (ref 40–150)
Anion Gap: 8 mEq/L (ref 3–11)
BUN: 13.5 mg/dL (ref 7.0–26.0)
CALCIUM: 9.4 mg/dL (ref 8.4–10.4)
CHLORIDE: 104 meq/L (ref 98–109)
CO2: 27 meq/L (ref 22–29)
Creatinine: 1.2 mg/dL — ABNORMAL HIGH (ref 0.6–1.1)
EGFR: 60 mL/min/{1.73_m2} — ABNORMAL LOW (ref >90)
Glucose: 135 mg/dl (ref 70–140)
POTASSIUM: 4.1 meq/L (ref 3.5–5.1)
Sodium: 139 mEq/L (ref 136–145)
Total Bilirubin: 1.97 mg/dL — ABNORMAL HIGH (ref 0.20–1.20)
Total Protein: 7.6 g/dL (ref 6.4–8.3)

## 2016-04-14 LAB — LACTATE DEHYDROGENASE: LDH: 152 U/L (ref 125–245)

## 2016-04-14 LAB — FERRITIN: FERRITIN: 408 ng/mL — AB (ref 9–269)

## 2016-04-14 LAB — PROTIME-INR: INR: 1.2 — AB (ref 0.9–1.1)

## 2016-04-14 MED ORDER — ERGOCALCIFEROL 1.25 MG (50000 UT) PO CAPS: 50000.0000 [IU] | ORAL_CAPSULE | ORAL | Status: DC

## 2016-04-14 NOTE — Telephone Encounter (Signed)
S.w. Pt and gv OCT appt....the patient ok and aware

## 2016-04-14 NOTE — Telephone Encounter (Signed)
Added pt on for labs...NO  pof at this time

## 2016-04-14 NOTE — Progress Notes (Signed)
[name]  HEMATOLOGY ONCOLOGY PROGRESS NOTE  Date of service:  04/14/2016   Patient Care Team: Neale Burly, MD as PCP - General (Internal Medicine) Daneil Dolin, MD (Gastroenterology)   Laban Emperor NP  CC:  follow-up for thrombocytopenia  Consulted for elevated ferritin level  DIAGNOSIS: Thrombocytopenia this is likely related to patient's hepatitis C, hypersplenism due to splenomegaly and liver disease.cannot rule out an element of ITP.   CURRENT TREATMENT  Observation  Previous treatment  Romiplostim 25mcg/kg to target platelet counts of 100,000 perioperatively for cervical spinal surgery for cervical myelopathy causing left upper extremity weakness. Treated preoperatively and then for at least 4 weeks postoperatively   INTERVAL HISTORY:  Gabriella Leon is here for her scheduled follow-up for thrombocytopenia. We will also reconsulted by her primary care physician .Neale Burly, MD for hyperferritinemia.  She notes no issues with bleeding. No petechiae.  Patient continues to follow with GI and ID for her liver cirrhosis and hepatitis C management  Respectively.  She has had elevated levels for at least Since 2012. His ferritin levels were nearly 700 on 11/11/2011 and have progressively come down to the 400-500 range. He had HFE genetic testing which was negative for common mutations. She notes that she has been transfused only once during this period. No other acute new symptoms.  REVIEW OF SYSTEMS:    10 Point review of systems of done and is negative except as noted above.  . Past Medical History  Diagnosis Date  . Diabetes mellitus   . Hypertension   . Cirrhosis ([place])   . Hepatitis B antibody positive   . Ulcer     ?  [name] Acid reflux disease with ulcer   . Depression   . Helicobacter pylori ab+     July 2010, s/p treatment  . GERD (gastroesophageal reflux disease)   . Gallstones   . Anxiety   . Blood transfusion   . Hepatitis C     and B positive  antibody  . Other pancytopenia ([place]) 10/27/2013  . Folate deficiency 03/28/2014    Noted on 03/23/2014.  Folate 1 mg prescribed.  . History of alcoholism ([place])   . Arthritis   . Liver cirrhosis ([place])   . DDD (degenerative disc disease), lumbar   . Esophageal varices ([place]) 09/18/2014  . Chronic kidney disease   . Headache     migraines  . Blood dyscrasia     thrombocytopenia-next tx injection 09/25/15    . Past Surgical History  Procedure Laterality Date  . Cesarean section  1988    [facility]  . Cholecystectomy  greater than 10 yrs    [facility]  . Appendectomy      age 18  . Biopsy  12/18/2011       . Colonoscopy  12/18/11    minimal anal canal hemorrhoids, friable rectal and colonic mucosa, left-sided diverticulosis, repeat in 2022.   [name] Esophagogastroduodenoscopy  12/18/11    3 columns of Grade II esophageal varices, reflux esohpagitis, portal gastropathy, path with chronic gastritis, negative H.pylori, surveillance in June 2014  . Multiple extractions with alveoloplasty  03/08/2012    Procedure: MULTIPLE EXTRACION WITH ALVEOLOPLASTY;  Surgeon: Gae Bon, DDS;  Location: [place];  Service: Oral Surgery;  Laterality: N/A;  . Esophagogastroduodenoscopy (egd) with propofol N/A 04/13/2014    Dr. [name]: grade 1-2 varices, hiatal hernia, portal gastropathy. NO need for further surveillance while on prophylaxis unless evidence of bleeding  . Anterior cervical decomp/discectomy fusion  N/A 09/28/2015    Procedure: Cervical three-four Anterior cervical decompression/diskectomy/fusion;  Surgeon: Leeroy Cha, MD;  Location: [place] [facility];  Service: Neurosurgery;  Laterality: N/A;  C3-4 Anterior cervical decompression/diskectomy/fusion    . Social History  Substance Use Topics  . Smoking status: Current Every Day Smoker -- 0.50 packs/day for 20 years    Types: Cigarettes  . Smokeless tobacco: Never Used  . Alcohol Use: No     Comment: last use 2015    ALLERGIES:  has No Known  Allergies.  MEDICATIONS:  Current Outpatient Prescriptions  Medication Sig Dispense Refill  . ergocalciferol (VITAMIN D2) 50000 units capsule Take 1 capsule (50,000 Units total) by mouth once a week. 8 capsule 0  . folic acid (FOLVITE) 1 MG tablet Take 1 tablet (1 mg total) by mouth daily. 30 tablet 11  . furosemide (LASIX) 40 MG tablet Take 40 mg by mouth daily.     [name] glimepiride (AMARYL) 2 MG tablet Take 2 mg by mouth daily.     [name] HYDROcodone-acetaminophen (NORCO/VICODIN) 5-325 MG per tablet Take 1 tablet by mouth every 6 (six) hours as needed for moderate pain.    [name] lisinopril-hydrochlorothiazide (PRINZIDE,ZESTORETIC) 10-12.5 MG per tablet Take 1 tablet by mouth daily.    [name] lubiprostone (AMITIZA) 24 MCG capsule Take 24 mcg by mouth 2 (two) times daily as needed for constipation.     . phytonadione (VITAMIN K) 5 MG tablet Take 1 tablet (5 mg total) by mouth daily. 10 tablet 0  . propranolol (INDERAL) 20 MG tablet Take 1 tablet (20 mg total) by mouth 2 (two) times daily. 60 tablet 3  . ranitidine (ZANTAC) 300 MG capsule Take 300 mg by mouth daily.    [name] ROMIPLOSTIM [place] Inject 1 each into the skin once a week. Injections are done at Dr. Grier Mitts office, [name] every Tuesday - next injection due 09/25/15 (43mcg/kg)    . sertraline (ZOLOFT) 25 MG tablet Take 25 mg by mouth daily.    [name] spironolactone (ALDACTONE) 50 MG tablet Take 50 mg by mouth daily.     No current facility-administered medications for this visit.    PHYSICAL EXAMINATION: ECOG PERFORMANCE STATUS: 1 - Symptomatic but completely ambulatory Vital signs reviewed GENERAL:alert, in no acute distress and comfortable SKIN: few healing bruises on her lower extremities EYES: normal, conjunctiva are pink and non-injected, sclera clear OROPHARYNX:no exudate, no erythema and lips, buccal mucosa, and tongue normal  NECK: supple, no JVD, thyroid normal size, non-tender, without nodularity LYMPH: no palpable  lymphadenopathy in the cervical, axillary or inguinal LUNGS: clear to auscultation with normal respiratory effort HEART: regular rate & rhythm, no murmurs. ABDOMEN: abdomen soft, non-tender, normoactive bowel sounds, Palpable splenomegaly 2-3 fingerbreadths below costal margin in midclavicular line. Musculoskeletal: bilateral trace edema PSYCH: alert & oriented x 3 with fluent speech NEURO: left upper extremity 5/5 power  LABORATORY DATA:   I have reviewed the data as listed  . CBC Latest Ref Rng 01/16/2016 11/06/2015 10/30/2015  WBC 3.9 - 10.3 10e3/uL 3.2(L) 4.0 3.5(L)  Hemoglobin 11.6 - 15.9 g/dL 11.0(L) 11.1(L) 10.9(L)  Hematocrit 34.8 - 46.6 % 32.2(L) 33.7(L) 32.5(L)  Platelets 145 - 400 10e3/uL 48(L) 146 244    . CMP Latest Ref Rng 01/16/2016 09/24/2015 08/29/2015  Glucose 70 - 140 mg/dl 142(H) 117(H) 100  BUN 7.0 - 26.0 mg/dL 12.1 13 8.7  Creatinine 0.6 - 1.1 mg/dL 1.3(H) 1.30(H) 1.2(H)  Sodium 136 - 145 mEq/L 138 134(L) 137  Potassium 3.5 -  5.1 mEq/L 3.7 4.1 4.3  Chloride 101 - 111 mmol/L - 99(L) -  CO2 22 - 29 mEq/L 30(H) 28 26  Calcium 8.4 - 10.4 mg/dL 9.3 9.5 10.1  Total Protein 6.4 - 8.3 g/dL 7.2 7.2 7.9  Total Bilirubin 0.20 - 1.20 mg/dL 2.05(H) 1.6(H) 2.03(H)  Alkaline Phos 40 - 150 U/L 138 131(H) 114  AST 5 - 34 U/L 36(H) 40 43(H)  ALT 0 - 55 U/L 18 21 27          RADIOGRAPHIC STUDIES: I have personally reviewed the radiological images as listed and agreed with the findings in the report. No results found.  ASSESSMENT & PLAN:   60 year old African American female with multiple medical comorbidities including hepatitis C, history of alcohol abuse, liver cirrhosis with portal hypertension referred to [place] for evaluation and management of  1] Thrombocytopenia this is likely related to patient's hepatitis C, hypersplenism due to splenomegaly and liver disease.cannot rule out an element of ITP. Baseline platelet counts recently appear to be around 40-50,000 these  appear to have dropped further due to an intercurrent viral upper respiratory tract infection. 2] pancytopenia likely related to liver disease, hepatitis C and hypersplenism. Plan -CBC with differential today. -no indication for actively treating her thrombocytopenia unless there are any issues with bleeding, platelets count drop to below 30,000 or patient needs to have surgery. If that's the case we might need to restart her treatment on Nplate. -continue following up with [facility] for evaluation and management of hepatitis C. -absolute alcohol cessation and smoking cessation.  3) Hyperferritinemia - patient has had this for the last 5 years. Her HFE mutation studies were negative for C282Y and H63D mutations in 2013.  Her ferritin levels come down from close to 700 down to the 400-500 range. She has not had significant PRBC transfusions for this to be transfusion iron overload. Her elevated ferritin level is likely related to her chronic liver disease/liver cirrhosis due to the release of ferritin from iron stores in the liver and also could be due to her hepatitis C which has been associated with increased GI iron absorption possibly related to decreased hepcidin production. Plan - No indication for therapeutic phlebotomy at this time. - Absolute alcohol avoidance. - Wide iron supplementation orally. - Low likelihood of cardiac injury. -- elevated ferritin appears to be a reflection and not a cause of her liver disease. Chance of significant liver injury/cardiac injury or other organ injury low for ferritin levels less than 1000 -- given her chronic anemia she would be a poor candidate for any therapeutic phlebotomy maneuvers. -we'll check ferritin level to trend.  4) Vit D deficiency -  Was only on maintenance dose vitamin D thousand units daily. - Given prescription for ergocalciferol 50,000 units once weekly for 8 weeks and then restart maintenance vitamin  D.  5) active smoking- counseled on smoking cessation.  Return to care in 6 months with Dr. Irene Limbo with CBC, CMP to monitor platelet counts. Earlier if any new concerns of need for surgery or if platelet drop below 30k or she has issues with bleeding. . Orders Placed This Encounter  Procedures  . CBC & Diff and Retic    Standing Status: Future     Number of Occurrences:      Standing Expiration Date: 05/19/2017  . Comprehensive metabolic panel    Standing Status: Future     Number of Occurrences:      Standing Expiration Date: 04/14/2017  . Lactate  dehydrogenase (LDH)    Standing Status: Future     Number of Occurrences:      Standing Expiration Date: 04/14/2017  . Ferritin    Standing Status: Future     Number of Occurrences:      Standing Expiration Date: 04/14/2017  . Iron and TIBC    Standing Status: Future     Number of Occurrences:      Standing Expiration Date: 04/14/2017  . CBC & Diff and Retic    Standing Status: Future     Number of Occurrences:      Standing Expiration Date: 05/19/2017  . Comprehensive metabolic panel    Standing Status: Future     Number of Occurrences:      Standing Expiration Date: 04/14/2017     Sullivan Lone MD [place] AAHIVMS [facility] Hematology/Oncology Physician [place]  (Office):       [phone] (Work cell):  [phone] (Fax):           [phone]

## 2016-05-08 ENCOUNTER — Telehealth: Payer: Self-pay | Admitting: Nurse Practitioner

## 2016-05-08 NOTE — Telephone Encounter (Signed)
Labs received, ferritin elevated at 488, hemochromatosis genetics negative , HCV negative. Hgb 10.5, plt 44, AST 44, Alk phos 139, bili 1.4, INR 1.2  MELD: 10 Child-Pugh: A

## 2016-07-15 ENCOUNTER — Ambulatory Visit: Payer: Medicaid Other | Admitting: Hematology

## 2016-07-15 ENCOUNTER — Other Ambulatory Visit: Payer: Medicaid Other

## 2016-07-16 ENCOUNTER — Other Ambulatory Visit: Payer: Self-pay | Admitting: *Deleted

## 2016-07-21 ENCOUNTER — Other Ambulatory Visit: Payer: Medicaid Other

## 2016-07-25 ENCOUNTER — Telehealth: Payer: Self-pay | Admitting: Internal Medicine

## 2016-07-25 ENCOUNTER — Other Ambulatory Visit: Payer: Self-pay

## 2016-07-25 DIAGNOSIS — R188 Other ascites: Principal | ICD-10-CM

## 2016-07-25 DIAGNOSIS — K7460 Unspecified cirrhosis of liver: Secondary | ICD-10-CM

## 2016-07-25 NOTE — Telephone Encounter (Signed)
Pt is on the Sept recall to have a 6 month ultrasound

## 2016-07-25 NOTE — Telephone Encounter (Signed)
Letter mailed

## 2016-07-29 ENCOUNTER — Other Ambulatory Visit: Payer: Self-pay | Admitting: Gastroenterology

## 2016-08-13 ENCOUNTER — Ambulatory Visit: Payer: Medicaid Other | Admitting: Nurse Practitioner

## 2016-09-02 IMAGING — CR DG CERVICAL SPINE 2 OR 3 VIEWS
2 series · 2 of 2 positions shown · non-contrast
Comparison: 08/13/2015

CLINICAL DATA: ACDF C3-4.

EXAM:
CERVICAL SPINE  4+ VIEWS

[lat (1 of 2)]
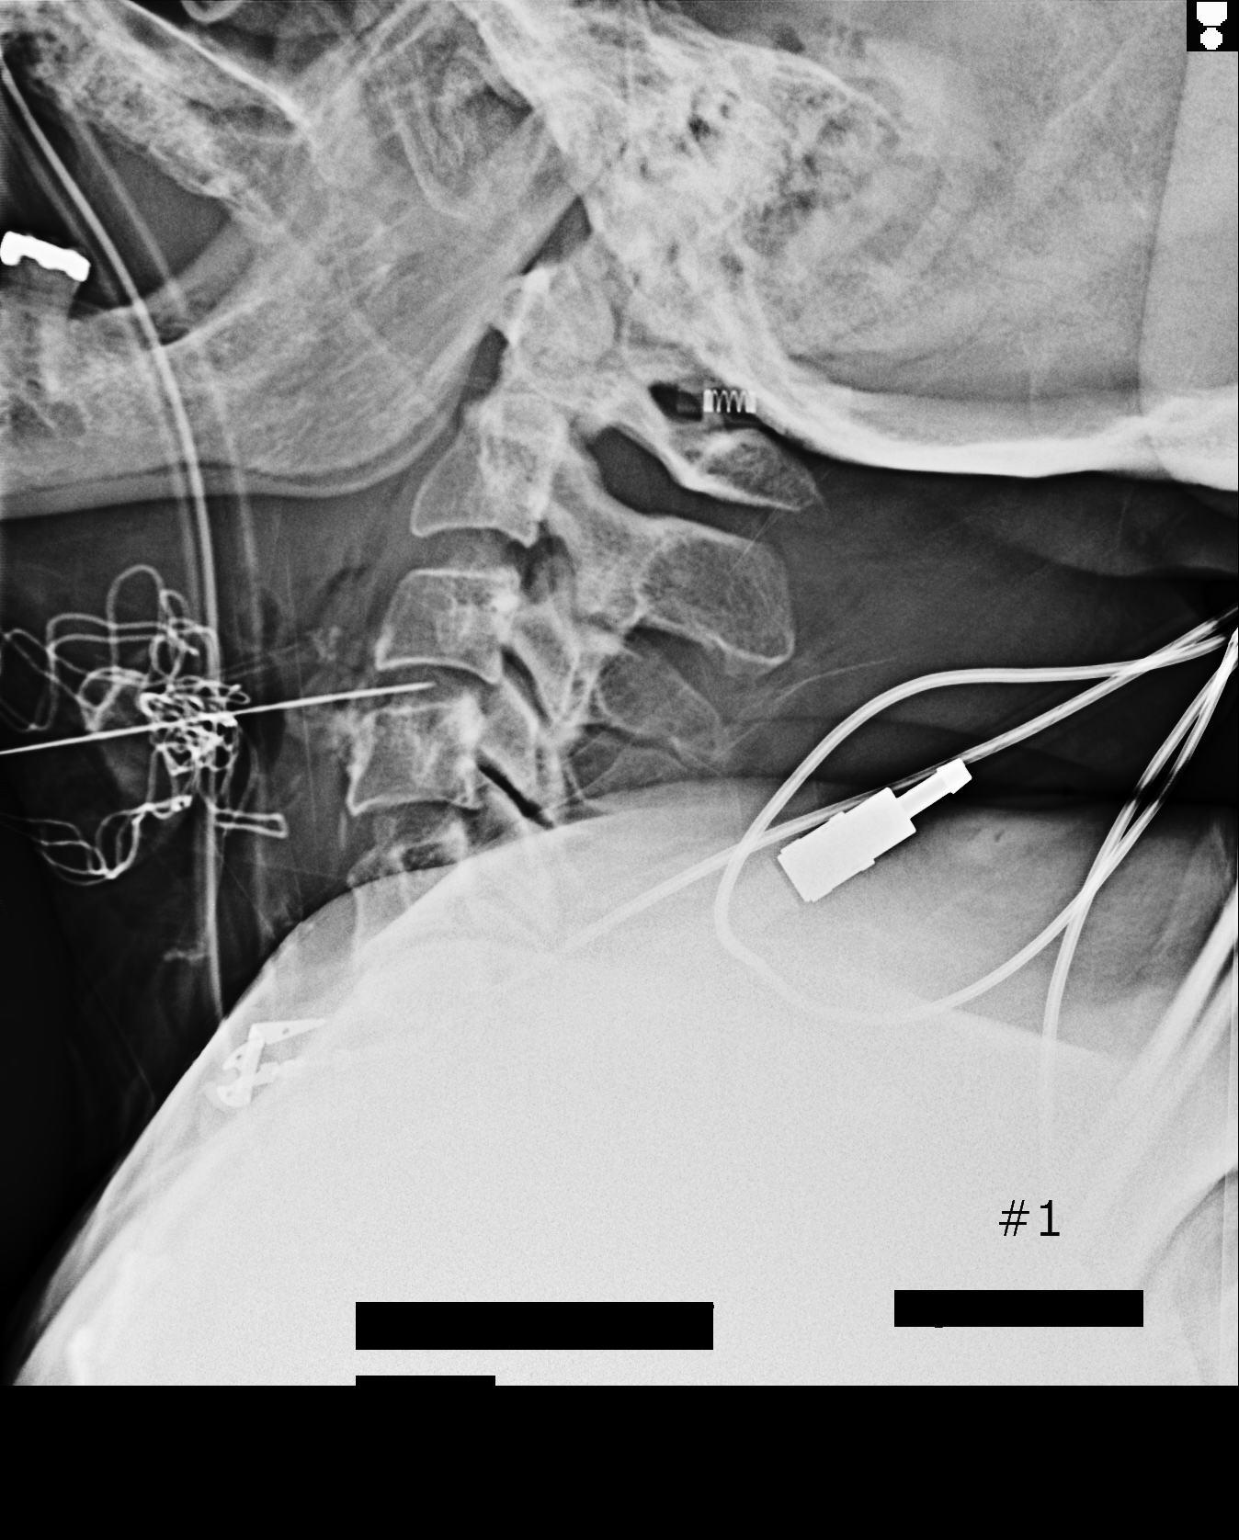

[lat (2 of 2)]
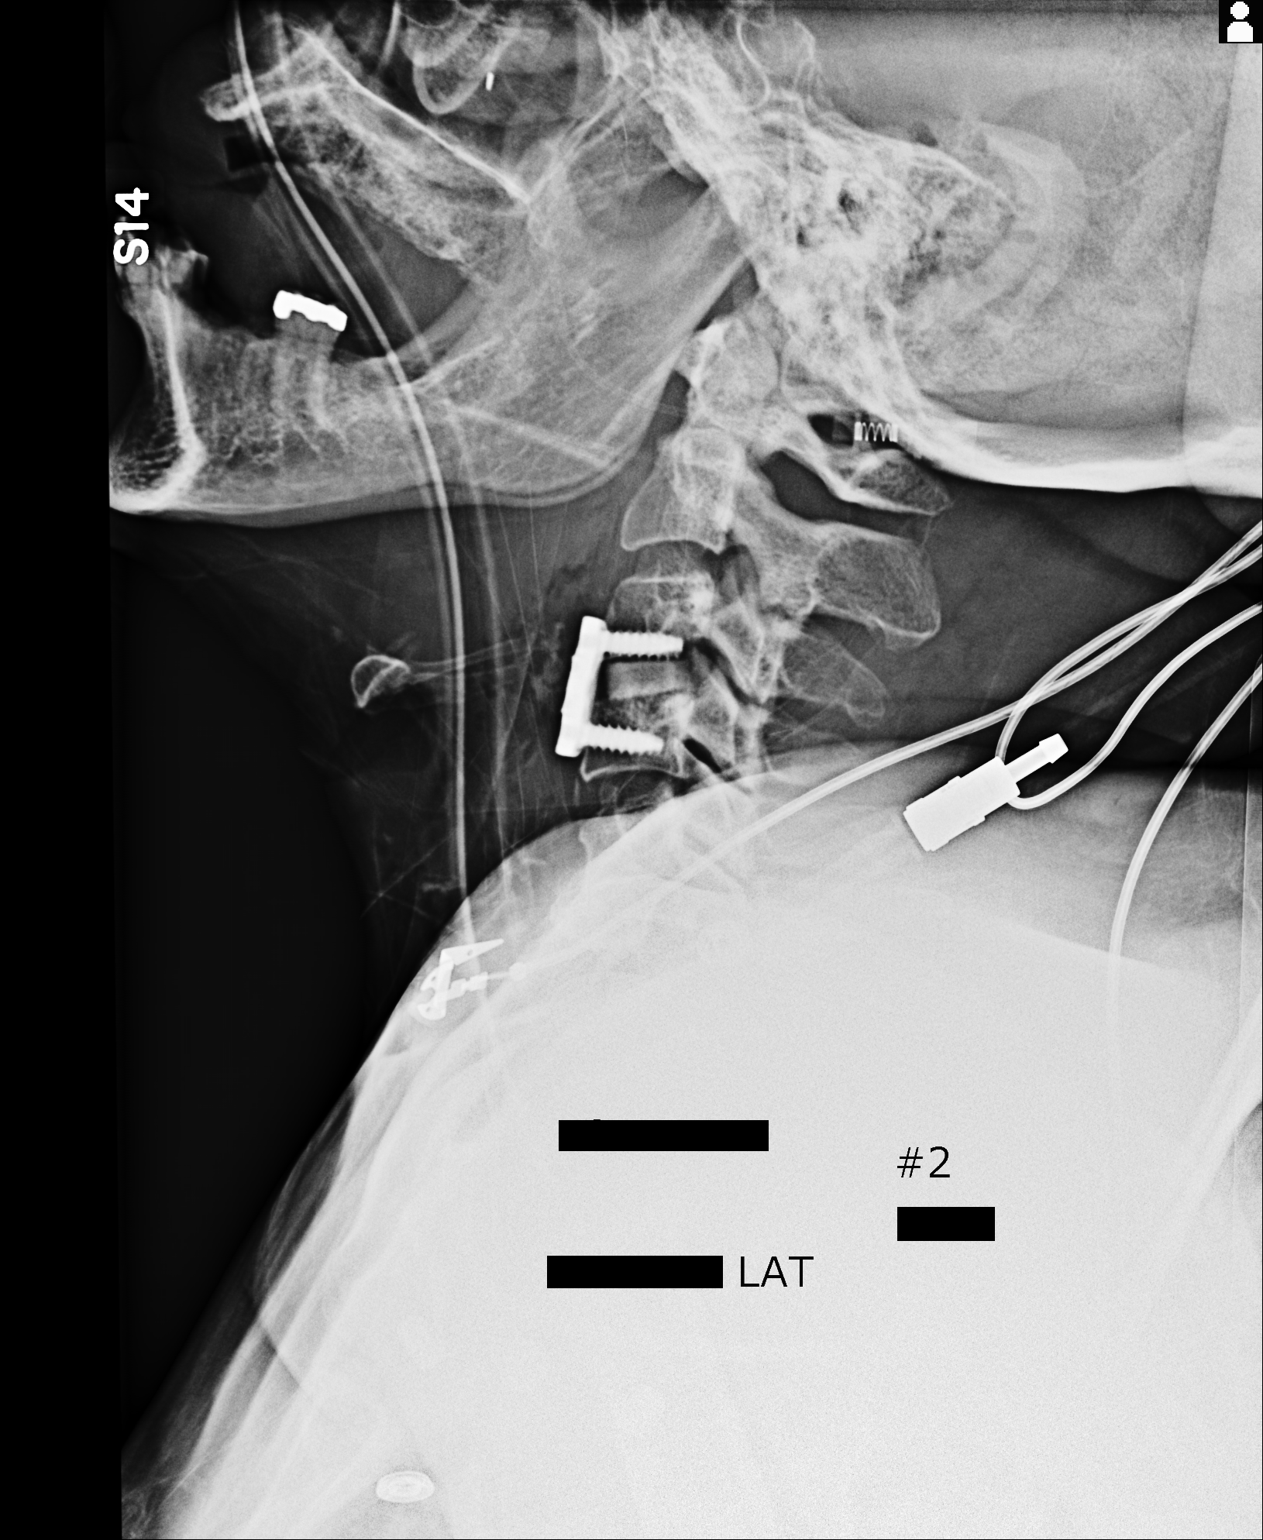

[2 of 2 positions shown; findings below may reference images not displayed]

FINDINGS: First lateral intraoperative image demonstrates an anterior
localizing instrument at C3-4.

Second lateral intraoperative image demonstrates changes of anterior
fusion at C3-4. No complicating feature.
IMPRESSION: Anterior fusion C3-4.

## 2016-09-03 ENCOUNTER — Ambulatory Visit: Payer: Medicaid Other | Admitting: Gastroenterology

## 2016-09-03 ENCOUNTER — Telehealth: Payer: Self-pay | Admitting: Gastroenterology

## 2016-09-03 ENCOUNTER — Encounter: Payer: Self-pay | Admitting: Gastroenterology

## 2016-09-03 NOTE — Telephone Encounter (Signed)
PT WAS A NO SHOW AND LETTER SENT  °

## 2016-10-03 ENCOUNTER — Encounter: Payer: Self-pay | Admitting: Gastroenterology

## 2016-10-03 ENCOUNTER — Ambulatory Visit (INDEPENDENT_AMBULATORY_CARE_PROVIDER_SITE_OTHER): Payer: Medicaid Other | Admitting: Gastroenterology

## 2016-10-03 VITALS — BP 140/80 | HR 75 | Temp 97.2°F | Ht 64.0 in | Wt 162.2 lb

## 2016-10-03 DIAGNOSIS — K7469 Other cirrhosis of liver: Secondary | ICD-10-CM | POA: Diagnosis not present

## 2016-10-03 NOTE — Progress Notes (Signed)
Referring Provider: Neale Burly, MD Primary Care Physician:  Neale Burly, MD  Primary GI: Dr. [name]   Chief Complaint  Patient presents with  . Follow-up    HPI:   Gabriella Leon is a 60 y.o. female presenting today with a history of Hep C cirrhosis, completing treatment in June 2016.   Husband going through last stages of cancer, getting ready to move to hospice. No abdominal pain. No N/V. No indigestion/heartburn. No confusion. Lost weight. Poor appetite with grief, "too much stress". Followed by Oncology due to history of elevated ferritin, likely secondary to known liver disease. Known SVR. Propanolol BID and Amitiza BID for constipation. She is eager to return home today to her husband. Appears tearful.   Past Medical History:  Diagnosis Date  . Acid reflux disease with ulcer   . Anxiety   . Arthritis   . Blood dyscrasia    thrombocytopenia-next tx injection 09/25/15  . Blood transfusion   . Chronic kidney disease   . Cirrhosis ([place])   . DDD (degenerative disc disease), lumbar   . Depression   . Diabetes mellitus   . Esophageal varices ([place]) 09/18/2014  . Folate deficiency 03/28/2014   Noted on 03/23/2014.  Folate 1 mg prescribed.  . Gallstones   . GERD (gastroesophageal reflux disease)   . Headache    migraines  . Helicobacter pylori ab+    July 2010, s/p treatment  . Hepatitis B antibody positive   . Hepatitis C    and B positive antibody  . History of alcoholism ([place])   . Hypertension   . Liver cirrhosis ([place])   . Other pancytopenia ([place]) 10/27/2013  . Ulcer ([place])    ?    Past Surgical History:  Procedure Laterality Date  . ANTERIOR CERVICAL DECOMP/DISCECTOMY FUSION N/A 09/28/2015   Procedure: Cervical three-four Anterior cervical decompression/diskectomy/fusion;  Surgeon: Leeroy Cha, MD;  Location: [place] [facility];  Service: Neurosurgery;  Laterality: N/A;  C3-4 Anterior cervical decompression/diskectomy/fusion  . APPENDECTOMY     age 84  .  BIOPSY  12/18/2011      . [place]  greater than 10 yrs   [place]  . COLONOSCOPY  12/18/11   minimal anal canal hemorrhoids, friable rectal and colonic mucosa, left-sided diverticulosis, repeat in 2022.   . ESOPHAGOGASTRODUODENOSCOPY  12/18/11   3 columns of Grade II esophageal varices, reflux esohpagitis, portal gastropathy, path with chronic gastritis, negative H.pylori, surveillance in June 2014  . ESOPHAGOGASTRODUODENOSCOPY (EGD) WITH PROPOFOL N/A 04/13/2014   Dr. [name]: grade 1-2 varices, hiatal hernia, portal gastropathy. NO need for further surveillance while on prophylaxis unless evidence of bleeding  . MULTIPLE EXTRACTIONS WITH ALVEOLOPLASTY  03/08/2012   Procedure: MULTIPLE EXTRACION WITH ALVEOLOPLASTY;  Surgeon: Gae Bon, DDS;  Location: [place] [facility];  Service: Oral Surgery;  Laterality: N/A;    Current Outpatient Prescriptions  Medication Sig Dispense Refill  . ergocalciferol (VITAMIN D2) 50000 units capsule Take 1 capsule (50,000 Units total) by mouth once a week. 8 capsule 0  . folic acid (FOLVITE) 1 MG tablet Take 1 tablet (1 mg total) by mouth daily. 30 tablet 11  . furosemide (LASIX) 40 MG tablet Take 40 mg by mouth daily.     [name] glimepiride (AMARYL) 2 MG tablet Take 2 mg by mouth daily.     [name] HYDROcodone-acetaminophen (NORCO/VICODIN) 5-325 MG per tablet Take 1 tablet by mouth every 6 (six) hours as needed for  moderate pain.    [name] lisinopril-hydrochlorothiazide (PRINZIDE,ZESTORETIC) 10-12.5 MG per tablet Take 1 tablet by mouth daily.    [name] lubiprostone (AMITIZA) 24 MCG capsule Take 24 mcg by mouth 2 (two) times daily as needed for constipation.     [name] omeprazole (PRILOSEC) 20 MG capsule TAKE 1 CAPSULE BY MOUTH TWICE DAILY BEFORE A MEAL 60 capsule 11  . phytonadione (VITAMIN K) 5 MG tablet Take 1 tablet (5 mg total) by mouth daily. 10 tablet 0  . propranolol (INDERAL) 20 MG tablet Take 1 tablet (20 mg total) by mouth 2 (two) times daily. 60  tablet 3  . ranitidine (ZANTAC) 300 MG capsule Take 300 mg by mouth daily.    [name] ROMIPLOSTIM [place] Inject 1 each into the skin once a week. Injections are done at Dr. Grier Mitts office, [name] every Tuesday - next injection due 09/25/15 (64mcg/kg)    . sertraline (ZOLOFT) 25 MG tablet Take 25 mg by mouth daily.    [name] spironolactone (ALDACTONE) 50 MG tablet Take 50 mg by mouth daily.     No current facility-administered medications for this visit.     Allergies as of 10/03/2016  . (No Known Allergies)    Family History  Problem Relation Age of Onset  . Diabetes Mother   . Cancer Paternal Uncle     passed away age 103  . Anesthesia problems Neg Hx   . Hypotension Neg Hx   . Malignant hyperthermia Neg Hx   . Pseudochol deficiency Neg Hx     Social History   Social History  . Marital status: Legally Separated    Spouse name: N/A  . Number of children: 1  . Years of education: N/A   Occupational History  . disabled Not Employed   Social History Main Topics  . Smoking status: Current Every Day Smoker    Packs/day: 0.50    Years: 20.00    Types: Cigarettes  . Smokeless tobacco: Never Used  . Alcohol use No     Comment: last use 2015  . Drug use: No  . Sexual activity: Yes    Birth control/ protection: Post-menopausal   Other Topics Concern  . None   Social History Narrative  . None    Review of Systems: As mentioned in HPI   Physical Exam: BP 140/80   Pulse 75   Temp 97.2 F (36.2 C) (Oral)   Ht 5\' 4"  (1.626 m)   Wt 162 lb 3.2 oz (73.6 kg)   BMI 27.84 kg/m  General:   Alert and oriented. More subdued, tearful with husband's diagnosis.  Head:  Normocephalic and atraumatic. Eyes:  Conjuctiva clear without scleral icterus Abdomen:  +BS, soft, non-tender and non-distended. No rebound or guarding. No HSM or masses noted. Msk:  Symmetrical without gross deformities. Normal posture. Extremities:  Without edema. Neurologic:  Alert and  oriented  x4 Psych:  Alert and cooperative.

## 2016-10-03 NOTE — Patient Instructions (Signed)
We will call you next week to arrange the ultrasound.   I am sorry to hear that you are going through a hard time. We are here for you.

## 2016-10-03 NOTE — Assessment & Plan Note (Addendum)
60 year old female with history of Hep C cirrhosis (achieving SVR last year), well-compensated. Continues on propanolol for variceal bleeding prophylaxis. US abdomen due and will be arranged. We will see her again in 6 months.

## 2016-10-06 ENCOUNTER — Other Ambulatory Visit: Payer: Self-pay | Admitting: Gastroenterology

## 2016-10-06 NOTE — Progress Notes (Signed)
ON RECALL  °

## 2016-10-10 ENCOUNTER — Ambulatory Visit (HOSPITAL_COMMUNITY)
Admission: RE | Admit: 2016-10-10 | Discharge: 2016-10-10 | Disposition: A | Discharge status: Home / Self Care | Payer: Medicaid Other | Source: Ambulatory Visit | Attending: Gastroenterology | Admitting: Gastroenterology

## 2016-10-10 DIAGNOSIS — Z9049 Acquired absence of other specified parts of digestive tract: Secondary | ICD-10-CM | POA: Insufficient documentation

## 2016-10-10 DIAGNOSIS — K7469 Other cirrhosis of liver: Secondary | ICD-10-CM | POA: Diagnosis present

## 2016-10-10 DIAGNOSIS — K838 Other specified diseases of biliary tract: Secondary | ICD-10-CM | POA: Insufficient documentation

## 2016-10-13 ENCOUNTER — Ambulatory Visit: Payer: Medicaid Other | Admitting: Hematology

## 2016-10-13 ENCOUNTER — Other Ambulatory Visit: Payer: Medicaid Other

## 2016-10-14 NOTE — Progress Notes (Signed)
No evidence of HCC. However, she has a dilated CBD with question of debris. She had no pain at time of office visit. We need to get a set of LFTs on her. Would feel more comfortable proceeding with an MRCP to rule out any occult small stones or other etiology.

## 2016-10-20 ENCOUNTER — Telehealth: Payer: Self-pay | Admitting: Internal Medicine

## 2016-10-20 ENCOUNTER — Other Ambulatory Visit: Payer: Self-pay

## 2016-10-20 DIAGNOSIS — R1011 Right upper quadrant pain: Secondary | ICD-10-CM

## 2016-10-20 NOTE — Telephone Encounter (Signed)
Pt called to see if her U/S of Abd were back yet. Please call [phone]

## 2016-10-20 NOTE — Telephone Encounter (Signed)
Pt is aware of her results.  

## 2016-10-21 ENCOUNTER — Other Ambulatory Visit: Payer: Self-pay

## 2016-10-21 ENCOUNTER — Other Ambulatory Visit: Payer: Self-pay | Admitting: Gastroenterology

## 2016-10-21 DIAGNOSIS — K7469 Other cirrhosis of liver: Secondary | ICD-10-CM

## 2016-10-24 ENCOUNTER — Other Ambulatory Visit: Payer: Self-pay | Admitting: Gastroenterology

## 2016-10-24 ENCOUNTER — Ambulatory Visit (HOSPITAL_COMMUNITY)
Admission: RE | Admit: 2016-10-24 | Discharge: 2016-10-24 | Disposition: A | Discharge status: Home / Self Care | Payer: Medicaid Other | Source: Ambulatory Visit | Attending: Gastroenterology | Admitting: Gastroenterology

## 2016-10-24 DIAGNOSIS — R1011 Right upper quadrant pain: Secondary | ICD-10-CM | POA: Diagnosis not present

## 2016-10-24 DIAGNOSIS — K7460 Unspecified cirrhosis of liver: Secondary | ICD-10-CM | POA: Diagnosis not present

## 2016-10-24 DIAGNOSIS — I8500 Esophageal varices without bleeding: Secondary | ICD-10-CM | POA: Diagnosis not present

## 2016-10-24 DIAGNOSIS — Z9049 Acquired absence of other specified parts of digestive tract: Secondary | ICD-10-CM | POA: Insufficient documentation

## 2016-10-24 DIAGNOSIS — I864 Gastric varices: Secondary | ICD-10-CM | POA: Diagnosis not present

## 2016-10-24 LAB — POCT I-STAT CREATININE: Creatinine, Ser: 1.2 mg/dL — ABNORMAL HIGH (ref 0.44–1.00)

## 2016-10-24 MED ORDER — GADOBENATE DIMEGLUMINE 529 MG/ML IV SOLN: 15.0000 mL | Freq: Once | INTRAVENOUS | Status: DC | PRN

## 2016-10-27 ENCOUNTER — Telehealth: Payer: Self-pay | Admitting: Internal Medicine

## 2016-10-27 NOTE — Progress Notes (Signed)
MRI/MRCP without any evidence of CBD stones. Exam was limited due to patient requesting the exam to be discontinued prior to end of study. However, despite this, no obvious stones.

## 2016-10-27 NOTE — Telephone Encounter (Signed)
Routing to AB.  

## 2016-10-27 NOTE — Telephone Encounter (Signed)
Please see results.

## 2016-10-27 NOTE — Telephone Encounter (Signed)
Pt had MRI on Friday and calling today for results. Please call [phone]

## 2016-10-28 NOTE — Telephone Encounter (Signed)
Spoke with the pt.  

## 2017-06-08 ENCOUNTER — Telehealth: Payer: Self-pay | Admitting: Gastroenterology

## 2017-06-08 ENCOUNTER — Ambulatory Visit: Payer: Medicaid Other | Admitting: Gastroenterology

## 2017-06-08 ENCOUNTER — Encounter: Payer: Self-pay | Admitting: Gastroenterology

## 2017-06-08 NOTE — Telephone Encounter (Signed)
PATIENT WAS A NO SHOW AND LETTER SENT  °

## 2017-08-06 ENCOUNTER — Ambulatory Visit (INDEPENDENT_AMBULATORY_CARE_PROVIDER_SITE_OTHER): Payer: Medicaid Other | Admitting: Gastroenterology

## 2017-08-06 ENCOUNTER — Other Ambulatory Visit: Payer: Self-pay

## 2017-08-06 VITALS — BP 164/85 | HR 78 | Temp 97.0°F | Ht 64.0 in | Wt 157.6 lb

## 2017-08-06 DIAGNOSIS — K7460 Unspecified cirrhosis of liver: Secondary | ICD-10-CM | POA: Diagnosis not present

## 2017-08-06 DIAGNOSIS — R7989 Other specified abnormal findings of blood chemistry: Secondary | ICD-10-CM

## 2017-08-06 LAB — PROTIME-INR
INR: 1.1
PROTHROMBIN TIME: 11.8 s — AB (ref 9.0–11.5)

## 2017-08-06 LAB — CBC WITH DIFFERENTIAL/PLATELET
Basophils Absolute: 23 cells/uL (ref 0–200)
Basophils Relative: 1 %
EOS PCT: 4 %
Eosinophils Absolute: 92 cells/uL (ref 15–500)
HCT: 31.9 % — ABNORMAL LOW (ref 35.0–45.0)
HEMOGLOBIN: 11 g/dL — AB (ref 11.7–15.5)
LYMPHS ABS: 736 {cells}/uL — AB (ref 850–3900)
Lymphocytes Relative: 32 %
MCH: 34.4 pg — ABNORMAL HIGH (ref 27.0–33.0)
MCHC: 34.5 g/dL (ref 32.0–36.0)
MCV: 99.7 fL (ref 80.0–100.0)
MPV: 12 fL (ref 7.5–12.5)
Monocytes Absolute: 230 cells/uL (ref 200–950)
Monocytes Relative: 10 %
NEUTROS PCT: 53 %
Neutro Abs: 1219 cells/uL — ABNORMAL LOW (ref 1500–7800)
Platelets: 55 10*3/uL — ABNORMAL LOW (ref 140–400)
RBC: 3.2 MIL/uL — AB (ref 3.80–5.10)
RDW: 13.1 % (ref 11.0–15.0)
WBC: 2.3 10*3/uL — AB (ref 3.8–10.8)

## 2017-08-06 MED ORDER — PROPRANOLOL HCL 20 MG PO TABS: ORAL_TABLET | ORAL | 3 refills | Status: DC

## 2017-08-06 NOTE — Patient Instructions (Signed)
Please have blood work done today   We have scheduled you for a routine ultrasound.  I would like for you to take 2 tablets of propanolol in the morning and 1 in the evening. Monitor for fatigue, dizziness, confusion, drops in blood pressure. Call me with any issues.  Go to [facility] and look at the protein powder section. I would like for you to supplement with 2 protein drinks a day. You can mix with almond milk or skim milk.   We will see you in 6 weeks to make sure you are doing well and that your weight is stable.   I am sorry for your loss! We are thinking about you.

## 2017-08-06 NOTE — Assessment & Plan Note (Addendum)
61 year old female with history of Hep C cirrhosis (documented SVR), well-compensated. Continues on propanolol for variceal bleeding prophylaxis but heart rate not at goal. US abdomen and blood work due. I note she has lost about 5 lbs since last seen in the setting of grief and decreased oral intake due to grief (husband passed away 2017-03-15).   1. CBC, CMP, INR, US abdomen ordered 2. Increase propanolol to 2 tablets in morning and 1 tablet in evening 3. Protein supplementation with protein drinks 4. Will see if any evidence of ascites on [place]. I would like to taper down diuretic therapy if she can tolerate it to perhaps every other day for now.  5. Will have her return in 6 weeks for close follow-up due to weight loss 6. Refer back to hematology for follow-up due to history of elevated ferritin that is thought to be secondary to chronic liver disease

## 2017-08-06 NOTE — Progress Notes (Signed)
Referring Provider: Neale Burly, MD Primary Care Physician:  Neale Burly, MD Primary GI: Dr. [name]   Chief Complaint  Patient presents with  . Cirrhosis    f/u, doing ok    HPI:   Gabriella Leon is a 61 y.o. female presenting today with a history of Hepatitis C cirrhosis, completing treatment in June 2016. Followed by Oncology historically due to elevated ferritin, felt to be secondary to known liver disease. Known SVR. Propanolol BID. Amitiza BID for constipation. US abdomen in Oct 2017 with dilated CBD at 9.3 mm with questionable debris in bile duct. MRCP was then obtained that was limited and incomplete due to patient requesting to discontinue prior to end of study; no obvious stones. Due for US abdomen now for [facility] screening. Needs updated labs as well.   Eating once a day. Appetite not good since husband passed away. Feels swollen in abdomen. No pain after eating. No N/V. No dysphagia. No swelling in lower extremities. Propanolol 20 mg BID.     Past Medical History:  Diagnosis Date  . Acid reflux disease with ulcer   . Anxiety   . Arthritis   . Blood dyscrasia    thrombocytopenia-next tx injection 09/25/15  . Blood transfusion   . Chronic kidney disease   . Cirrhosis ([place])   . DDD (degenerative disc disease), lumbar   . Depression   . Diabetes mellitus   . Esophageal varices ([place]) 09/18/2014  . Folate deficiency 03/28/2014   Noted on 03/23/2014.  Folate 1 mg prescribed.  . Gallstones   . GERD (gastroesophageal reflux disease)   . Headache    migraines  . Helicobacter pylori ab+    July 2010, s/p treatment  . Hepatitis B antibody positive   . Hepatitis C    and B positive antibody  . History of alcoholism ([place])   . Hypertension   . Liver cirrhosis ([place])   . Other pancytopenia ([place]) 10/27/2013  . Ulcer ([place])    ?    Past Surgical History:  Procedure Laterality Date  . ANTERIOR CERVICAL DECOMP/DISCECTOMY FUSION N/A 09/28/2015   Procedure: Cervical  three-four Anterior cervical decompression/diskectomy/fusion;  Surgeon: Leeroy Cha, MD;  Location: [place] [facility];  Service: Neurosurgery;  Laterality: N/A;  C3-4 Anterior cervical decompression/diskectomy/fusion  . APPENDECTOMY     age 76  . BIOPSY  12/18/2011      . [place]  greater than 10 yrs   [place]  . COLONOSCOPY  12/18/11   minimal anal canal hemorrhoids, friable rectal and colonic mucosa, left-sided diverticulosis, repeat in 2022.   . ESOPHAGOGASTRODUODENOSCOPY  12/18/11   3 columns of Grade II esophageal varices, reflux esohpagitis, portal gastropathy, path with chronic gastritis, negative H.pylori, surveillance in June 2014  . ESOPHAGOGASTRODUODENOSCOPY (EGD) WITH PROPOFOL N/A 04/13/2014   Dr. [name]: grade 1-2 varices, hiatal hernia, portal gastropathy. NO need for further surveillance while on prophylaxis unless evidence of bleeding  . MULTIPLE EXTRACTIONS WITH ALVEOLOPLASTY  03/08/2012   Procedure: MULTIPLE EXTRACION WITH ALVEOLOPLASTY;  Surgeon: Gae Bon, DDS;  Location: [place];  Service: Oral Surgery;  Laterality: N/A;    Current Outpatient Prescriptions  Medication Sig Dispense Refill  . ergocalciferol (VITAMIN D2) 50000 units capsule Take 1 capsule (50,000 Units total) by mouth once a week. 8 capsule 0  . folic acid (FOLVITE) 1 MG tablet Take 1 tablet (1 mg total) by mouth daily. 30 tablet 11  . furosemide (  LASIX) 40 MG tablet Take 40 mg by mouth daily.     [name] glimepiride (AMARYL) 2 MG tablet Take 2 mg by mouth daily.     [name] HYDROcodone-acetaminophen (NORCO/VICODIN) 5-325 MG per tablet Take 1 tablet by mouth every 6 (six) hours as needed for moderate pain.    [name] lisinopril-hydrochlorothiazide (PRINZIDE,ZESTORETIC) 10-12.5 MG per tablet Take 1 tablet by mouth daily.    [name] lubiprostone (AMITIZA) 24 MCG capsule Take 24 mcg by mouth 2 (two) times daily as needed for constipation.     [name] omeprazole (PRILOSEC) 20 MG capsule TAKE 1  CAPSULE BY MOUTH TWICE DAILY BEFORE A MEAL 60 capsule 11  . phytonadione (VITAMIN K) 5 MG tablet Take 1 tablet (5 mg total) by mouth daily. 10 tablet 0  . propranolol (INDERAL) 20 MG tablet Take 2 tablets in the morning and 1 in evening. 90 tablet 3  . ranitidine (ZANTAC) 300 MG capsule Take 300 mg by mouth daily.    . sertraline (ZOLOFT) 25 MG tablet Take 25 mg by mouth daily.    [name] spironolactone (ALDACTONE) 50 MG tablet Take 50 mg by mouth daily.     No current facility-administered medications for this visit.     Allergies as of 08/06/2017  . (No Known Allergies)    Family History  Problem Relation Age of Onset  . Diabetes Mother   . Cancer Paternal Uncle        passed away age 75  . Anesthesia problems Neg Hx   . Hypotension Neg Hx   . Malignant hyperthermia Neg Hx   . Pseudochol deficiency Neg Hx     Social History   Social History  . Marital status: Legally Separated    Spouse name: N/A  . Number of children: 1  . Years of education: N/A   Occupational History  . disabled Not Employed   Social History Main Topics  . Smoking status: Current Every Day Smoker    Packs/day: 0.50    Years: 20.00    Types: Cigarettes  . Smokeless tobacco: Never Used  . Alcohol use No     Comment: last use 2015  . Drug use: No  . Sexual activity: Yes    Birth control/ protection: Post-menopausal   Other Topics Concern  . Not on file   Social History Narrative  . No narrative on file    Review of Systems: As mentioned in HPI   Physical Exam: BP (!) 164/85   Pulse 78   Temp (!) 97 F (36.1 C) (Oral)   Ht 5\' 4"  (1.626 m)   Wt 157 lb 9.6 oz (71.5 kg)   BMI 27.05 kg/m  General:   Alert and oriented. No distress noted. Pleasant and cooperative.  Head:  Normocephalic and atraumatic. Eyes:  Conjuctiva clear without scleral icterus. Mouth:  Oral mucosa pink and moist. Good dentition. No lesions. Abdomen:  +BS, soft, non-tender and non-distended. No rebound or guarding. No  HSM or masses noted. No obvious ascites.  Msk:  Symmetrical without gross deformities. Normal posture. Extremities:  Without edema. Neurologic:  Alert and  oriented x4 Psych:  Alert and cooperative. Normal mood and affect.

## 2017-08-06 NOTE — Progress Notes (Signed)
cc'ed to pcp °

## 2017-08-07 LAB — COMPLETE METABOLIC PANEL WITH GFR
ALT: 22 U/L (ref 6–29)
AST: 42 U/L — AB (ref 10–35)
Albumin: 3.8 g/dL (ref 3.6–5.1)
Alkaline Phosphatase: 130 U/L (ref 33–130)
BUN: 14 mg/dL (ref 7–25)
CHLORIDE: 104 mmol/L (ref 98–110)
CO2: 25 mmol/L (ref 20–32)
Calcium: 9.4 mg/dL (ref 8.6–10.4)
Creat: 1.19 mg/dL — ABNORMAL HIGH (ref 0.50–1.05)
GFR, Est African American: 58 mL/min — ABNORMAL LOW (ref >=60)
GFR, Est Non African American: 50 mL/min — ABNORMAL LOW (ref >=60)
GLUCOSE: 101 mg/dL — AB (ref 65–99)
POTASSIUM: 3.8 mmol/L (ref 3.5–5.3)
SODIUM: 140 mmol/L (ref 135–146)
Total Bilirubin: 0.9 mg/dL (ref 0.2–1.2)
Total Protein: 7.1 g/dL (ref 6.1–8.1)

## 2017-08-07 NOTE — Patient Instructions (Signed)
PA info for [place] abd RUQ submitted via [employer]. Case approved. PA# [id number], 08/07/17-09/06/17.

## 2017-08-11 ENCOUNTER — Ambulatory Visit (HOSPITAL_COMMUNITY): Payer: Medicaid Other

## 2017-08-17 ENCOUNTER — Ambulatory Visit (HOSPITAL_COMMUNITY): Admission: RE | Admit: 2017-08-17 | Payer: Medicaid Other | Source: Ambulatory Visit

## 2017-08-17 NOTE — Progress Notes (Signed)
Cr at baseline. Will see what US shows if any ascites. May be able to taper down off diuretics. MELD 9. Hgb around baseline. Keep appt with hematology regarding elevated ferritin.

## 2017-08-18 ENCOUNTER — Telehealth: Payer: Self-pay | Admitting: Internal Medicine

## 2017-08-18 NOTE — Telephone Encounter (Signed)
See result note.  

## 2017-08-18 NOTE — Telephone Encounter (Signed)
Please call patient if lab results are back

## 2017-08-19 ENCOUNTER — Ambulatory Visit (HOSPITAL_COMMUNITY)
Admission: RE | Admit: 2017-08-19 | Discharge: 2017-08-19 | Disposition: A | Discharge status: Home / Self Care | Payer: Medicaid Other | Source: Ambulatory Visit | Attending: Gastroenterology | Admitting: Gastroenterology

## 2017-08-19 DIAGNOSIS — K838 Other specified diseases of biliary tract: Secondary | ICD-10-CM | POA: Diagnosis not present

## 2017-08-19 DIAGNOSIS — Z9049 Acquired absence of other specified parts of digestive tract: Secondary | ICD-10-CM | POA: Diagnosis not present

## 2017-08-19 DIAGNOSIS — K7460 Unspecified cirrhosis of liver: Secondary | ICD-10-CM | POA: Diagnosis present

## 2017-08-20 ENCOUNTER — Telehealth: Payer: Self-pay

## 2017-08-20 NOTE — Telephone Encounter (Signed)
Pt is aware.  

## 2017-08-20 NOTE — Telephone Encounter (Signed)
Ultrasound with cirrhosis, no concerning findings. Increase lasix and aldactone to twice a day for 3 days, then back to once daily. Strictly follow a low sodium diet, no more than 2 grams a day. If further swelling, needs OV.

## 2017-08-20 NOTE — Telephone Encounter (Signed)
Pt called office to find out about her [place] results that she had done yesterday. Informed her that AB hasn't reviewed results yet but when she does the nurse will call her. She wanted to let AB know that her legs are swelling. Also has some abdominal swelling. No shortness of breath at this time. She is taking Lasix 40mg  once a day and Aldactone 50mg  twice a day.  Routing to AB.

## 2017-08-26 ENCOUNTER — Encounter: Payer: Self-pay | Admitting: Gastroenterology

## 2017-08-26 ENCOUNTER — Telehealth: Payer: Self-pay | Admitting: Gastroenterology

## 2017-08-26 ENCOUNTER — Ambulatory Visit: Payer: Self-pay | Admitting: Gastroenterology

## 2017-08-26 NOTE — Telephone Encounter (Signed)
Pt was a no show and letter sent  °

## 2017-09-07 ENCOUNTER — Encounter: Payer: Self-pay | Admitting: Internal Medicine

## 2017-09-25 ENCOUNTER — Ambulatory Visit (HOSPITAL_COMMUNITY): Payer: Medicaid Other

## 2017-10-07 ENCOUNTER — Ambulatory Visit: Payer: Medicaid Other | Admitting: Gastroenterology

## 2017-10-12 ENCOUNTER — Other Ambulatory Visit: Payer: Self-pay | Admitting: Nurse Practitioner

## 2017-10-21 ENCOUNTER — Ambulatory Visit: Payer: Self-pay | Admitting: Nurse Practitioner

## 2017-10-23 ENCOUNTER — Ambulatory Visit (HOSPITAL_COMMUNITY): Payer: Self-pay

## 2017-11-11 ENCOUNTER — Encounter (HOSPITAL_COMMUNITY): Payer: Medicaid Other

## 2017-11-23 ENCOUNTER — Other Ambulatory Visit: Payer: Self-pay

## 2017-11-23 ENCOUNTER — Encounter (HOSPITAL_COMMUNITY): Payer: Self-pay | Admitting: Oncology

## 2017-11-23 ENCOUNTER — Encounter (HOSPITAL_COMMUNITY): Payer: Medicaid Other | Attending: Oncology | Admitting: Oncology

## 2017-11-23 VITALS — BP 145/80 | HR 52 | Temp 98.5°F | Resp 16 | Ht 64.0 in | Wt 160.1 lb

## 2017-11-23 DIAGNOSIS — F17200 Nicotine dependence, unspecified, uncomplicated: Secondary | ICD-10-CM | POA: Diagnosis not present

## 2017-11-23 DIAGNOSIS — D61818 Other pancytopenia: Secondary | ICD-10-CM | POA: Diagnosis present

## 2017-11-23 DIAGNOSIS — R188 Other ascites: Secondary | ICD-10-CM | POA: Diagnosis not present

## 2017-11-23 DIAGNOSIS — I509 Heart failure, unspecified: Secondary | ICD-10-CM

## 2017-11-23 DIAGNOSIS — R7989 Other specified abnormal findings of blood chemistry: Secondary | ICD-10-CM

## 2017-11-23 DIAGNOSIS — D731 Hypersplenism: Secondary | ICD-10-CM | POA: Diagnosis not present

## 2017-11-23 DIAGNOSIS — K7460 Unspecified cirrhosis of liver: Secondary | ICD-10-CM | POA: Diagnosis not present

## 2017-11-23 DIAGNOSIS — B182 Chronic viral hepatitis C: Secondary | ICD-10-CM

## 2017-11-23 NOTE — Patient Instructions (Addendum)
[place] at [facility] Discharge Instructions  RECOMMENDATIONS MADE BY THE CONSULTANT AND ANY TEST RESULTS WILL BE SENT TO YOUR REFERRING PHYSICIAN.  You were seen today by Dr. Forest Gleason No further follow up appointments needed  Thank you for choosing [place] at [facility] to provide your oncology and hematology care.  To afford each patient quality time with our provider, please arrive at least 15 minutes before your scheduled appointment time.    If you have a lab appointment with the [facility] please come in thru the  Main Entrance and check in at the main information desk  You need to re-schedule your appointment should you arrive 10 or more minutes late.  We strive to give you quality time with our providers, and arriving late affects you and other patients whose appointments are after yours.  Also, if you no show three or more times for appointments you may be dismissed from the clinic at the providers discretion.     Again, thank you for choosing [facility].  Our hope is that these requests will decrease the amount of time that you wait before being seen by our physicians.       _____________________________________________________________  Should you have questions after your visit to [facility], please contact our office at [phone] between the hours of 8:30 a.m. and 4:30 p.m.  Voicemails left after 4:30 p.m. will not be returned until the following business day.  For prescription refill requests, have your pharmacy contact our office.       Resources For Cancer Patients and their Caregivers ? American Cancer Society: Can assist with transportation, wigs, general needs, runs Look Good Feel Better.        8432833669 ? Cancer Care: Provides financial assistance, online support groups, medication/co-pay assistance.  1-800-813-HOPE 239-370-8750) ? [facility] Assists  [place] cancer patients and their families through emotional , educational and financial support.  213 439 3603 ? [place] DSS Where to apply for food stamps, Medicaid and utility assistance. 864-102-5484 ? [facility]: Transportation to medical appointments. 248-132-3627 ? Social Security Administration: May apply for disability if have a Stage IV cancer. 610-646-8447 775-654-3359 ? [employer], Disability and Transit Services: Assists with nutrition, care and transit needs. [facility] Support Programs: @10RELATIVEDAYS @ > Cancer Support Group  2nd Tuesday of the month 1pm-2pm, Journey Room  > Creative Journey  3rd Tuesday of the month 1130am-1pm, Journey Room  > Look Good Feel Better  1st Wednesday of the month 10am-12 noon, Journey Room (Call [place] to register [phone])

## 2017-11-23 NOTE — Progress Notes (Signed)
[facility] Initial Visit:  Patient Care Team: Neale Burly, MD as PCP - General (Internal Medicine) [name] Cristopher Estimable, MD (Gastroenterology)  CHIEF COMPLAINTS/PURPOSE OF CONSULTATION: Slightly elevated ferritin Pancytopenia Cirrhosis of liver with ascites Chronic congestive heart failure    No history exists.    HISTORY OF PRESENTING ILLNESS: Gabriella Leon 61 y.o. female is here because of  61 year old lady with a previous history of smoking history of cirrhosis of liver.  Patient has pancytopenia which is stable.over one year..  Patient has elevated ferritin documented cirrhosis of liver chronic congestive heart failure. Here being referred for slightly elevated ferritin level which has been stable repeat of last 2 years. Is been recently admitted in the hospital for chronic congestive heart failure According to [place] he is being followed by gastroenterologist for hepatitis C and cirrhosis of liver   Review of Systems - Oncology  GENERAL: has multiple medical problems.  Had been hospitalized recently. PERFORMANCE STATUS (ECOG):01 HEENT:  No visual changes, runny nose, sore throat, mouth sores or tenderness. Lungs: No shortness of breath or cough.  No hemoptysis. Cardiac:  No chest pain, palpitations, orthopnea, or PND. GI: History of cirrhosis of liver.  Ascites. GU:  No urgency, frequency, dysuria, or hematuria. Musculoskeletal:  No back pain.  No joint pain.  No muscle tenderness. Extremities:  No pain or swelling. Skin: Maculopapular rash itching present Neuro:  No headache, numbness or weakness, balance or coordination issues. Endocrine:  No diabetes, thyroid issues, hot flashes or night sweats. Psych:  No mood changes, depression or anxiety. Pain:  No focal pain. Review of systems:  All other systems reviewed and found to be negative.  MEDICAL HISTORY: Past Medical History:  Diagnosis Date  . Acid reflux disease with ulcer   . Anxiety   .  Arthritis   . Blood dyscrasia    thrombocytopenia-next tx injection 09/25/15  . Blood transfusion   . Chronic kidney disease   . Cirrhosis ([place])   . DDD (degenerative disc disease), lumbar   . Depression   . Diabetes mellitus   . Esophageal varices ([place]) 09/18/2014  . Folate deficiency 03/28/2014   Noted on 03/23/2014.  Folate 1 mg prescribed.  . Gallstones   . GERD (gastroesophageal reflux disease)   . Headache    migraines  . Helicobacter pylori ab+    July 2010, s/p treatment  . Hepatitis B antibody positive   . Hepatitis C    and B positive antibody  . History of alcoholism ([place])   . Hypertension   . Liver cirrhosis ([place])   . Other pancytopenia ([place]) 10/27/2013  . Ulcer    ?    SURGICAL HISTORY: Past Surgical History:  Procedure Laterality Date  . ANTERIOR CERVICAL DECOMP/DISCECTOMY FUSION N/A 09/28/2015   Procedure: Cervical three-four Anterior cervical decompression/diskectomy/fusion;  Surgeon: Leeroy Cha, MD;  Location: [place] [facility];  Service: Neurosurgery;  Laterality: N/A;  C3-4 Anterior cervical decompression/diskectomy/fusion  . APPENDECTOMY     age 47  . BIOPSY  12/18/2011      . [place]  greater than 10 yrs   [place]  . COLONOSCOPY  12/18/11   minimal anal canal hemorrhoids, friable rectal and colonic mucosa, left-sided diverticulosis, repeat in 2022.   . ESOPHAGOGASTRODUODENOSCOPY  12/18/11   3 columns of Grade II esophageal varices, reflux esohpagitis, portal gastropathy, path with chronic gastritis, negative H.pylori, surveillance in June 2014  . ESOPHAGOGASTRODUODENOSCOPY (EGD) WITH PROPOFOL  N/A 04/13/2014   Dr. [name]: grade 1-2 varices, hiatal hernia, portal gastropathy. NO need for further surveillance while on prophylaxis unless evidence of bleeding  . MULTIPLE EXTRACTIONS WITH ALVEOLOPLASTY  03/08/2012   Procedure: MULTIPLE EXTRACION WITH ALVEOLOPLASTY;  Surgeon: Gae Bon, DDS;  Location: [place];  Service:  Oral Surgery;  Laterality: N/A;    SOCIAL HISTORY: Social History   Socioeconomic History  . Marital status: Legally Separated    Spouse name: Not on file  . Number of children: 1  . Years of education: Not on file  . Highest education level: Not on file  Social Needs  . Financial resource strain: Not on file  . Food insecurity - worry: Not on file  . Food insecurity - inability: Not on file  . Transportation needs - medical: Not on file  . Transportation needs - non-medical: Not on file  Occupational History  . Occupation: disabled    Fish farm manager: NOT EMPLOYED  Tobacco Use  . Smoking status: Current Every Day Smoker    Packs/day: 0.50    Years: 20.00    Pack years: 10.00    Types: Cigarettes  . Smokeless tobacco: Never Used  Substance and Sexual Activity  . Alcohol use: No    Comment: last use 2015  . Drug use: No  . Sexual activity: Yes    Birth control/protection: Post-menopausal  Other Topics Concern  . Not on file  Social History Narrative  . Not on file    FAMILY HISTORY Family History  Problem Relation Age of Onset  . Diabetes Mother   . Cancer Paternal Uncle        passed away age 42  . Anesthesia problems Neg Hx   . Hypotension Neg Hx   . Malignant hyperthermia Neg Hx   . Pseudochol deficiency Neg Hx     ALLERGIES:  has No Known Allergies.  MEDICATIONS:  Current Outpatient Medications  Medication Sig Dispense Refill  . ergocalciferol (VITAMIN D2) 50000 units capsule Take 1 capsule (50,000 Units total) by mouth once a week. 8 capsule 0  . folic acid (FOLVITE) 1 MG tablet Take 1 tablet (1 mg total) by mouth daily. 30 tablet 11  . furosemide (LASIX) 40 MG tablet Take 40 mg by mouth daily.     [name] glimepiride (AMARYL) 2 MG tablet Take 2 mg by mouth daily.     [name] HYDROcodone-acetaminophen (NORCO/VICODIN) 5-325 MG per tablet Take 1 tablet by mouth every 6 (six) hours as needed for moderate pain.    [name] lisinopril-hydrochlorothiazide (PRINZIDE,ZESTORETIC)  10-12.5 MG per tablet Take 1 tablet by mouth daily.    [name] lubiprostone (AMITIZA) 24 MCG capsule Take 24 mcg by mouth 2 (two) times daily as needed for constipation.     [name] omeprazole (PRILOSEC) 20 MG capsule TAKE 1 CAPSULE BY MOUTH TWICE DAILY BEFORE A MEAL 60 capsule 5  . phytonadione (VITAMIN K) 5 MG tablet Take 1 tablet (5 mg total) by mouth daily. 10 tablet 0  . propranolol (INDERAL) 20 MG tablet Take 2 tablets in the morning and 1 in evening. 90 tablet 3  . ranitidine (ZANTAC) 300 MG capsule Take 300 mg by mouth daily.    . sertraline (ZOLOFT) 25 MG tablet Take 25 mg by mouth daily.    [name] spironolactone (ALDACTONE) 50 MG tablet Take 50 mg by mouth daily.     No current facility-administered medications for this visit.     PHYSICAL EXAMINATION: GENERAL:  Well developed, well nourished, sitting  comfortably in the exam room in no acute distress. MENTAL STATUS:  Alert and oriented to person, place and time.  EYES: Sclerae slightly icteric ENT:  Oropharynx clear without lesion.  Tongue normal. Mucous membranes moist.  RESPIRATORY: Emphysematous chest.  Occasional rhonchi on both sides. CARDIOVASCULAR:  Regular rate and soft systolic murmur Breast was not examined.. ABDOMEN: Ascites.  Palpable liver 2 fingers below costal margin spleen not palpable . bACK:  No CVA tenderness.  No tenderness on percussion of the back or rib cage. SKIN:  No rashes, ulcers or lesions. EXTREMITIES: No edema, no skin discoloration or tenderness.  No palpable cords. LYMPH NODES: No palpable cervical, supraclavicular, axillary or inguinal adenopathy  NEUROLOGICAL: Unremarkable. PSYCH:  Appropriate. ECOG PERFORMANCE STATUS: 1 - Symptomatic but completely ambulatory       LABORATORY DATA: Patient's all previous lab data has been reviewed.  Patient does show pancytopenia which is stable over period of many years. MRI scan done previously ultrasound done recently shows cirrhosis of liver which is  progressing Ferritin is elevated Abnormal liver enzymes  No visits with results within 1 Month(s) from this visit.  Latest known visit with results is:  Office Visit on 08/06/2017  Component Date Value Ref Range Status  . WBC 08/06/2017 2.3* 3.8 - 10.8 K/uL Final  . RBC 08/06/2017 3.20* 3.80 - 5.10 MIL/uL Final  . Hemoglobin 08/06/2017 11.0* 11.7 - 15.5 g/dL Final  . HCT 08/06/2017 31.9* 35.0 - 45.0 % Final  . MCV 08/06/2017 99.7  80.0 - 100.0 fL Final  . MCH 08/06/2017 34.4* 27.0 - 33.0 pg Final  . MCHC 08/06/2017 34.5  32.0 - 36.0 g/dL Final  . RDW 08/06/2017 13.1  11.0 - 15.0 % Final  . Platelets 08/06/2017 55* 140 - 400 K/uL Final   Platelet count confirmed on smear  . MPV 08/06/2017 12.0  7.5 - 12.5 fL Final  . Neutro Abs 08/06/2017 1,219* 1,500 - 7,800 cells/uL Final  . Lymphs Abs 08/06/2017 736* 850 - 3,900 cells/uL Final  . Monocytes Absolute 08/06/2017 230  200 - 950 cells/uL Final  . Eosinophils Absolute 08/06/2017 92  15 - 500 cells/uL Final  . Basophils Absolute 08/06/2017 23  0 - 200 cells/uL Final  . Neutrophils Relative % 08/06/2017 53  % Final  . Lymphocytes Relative 08/06/2017 32  % Final  . Monocytes Relative 08/06/2017 10  % Final  . Eosinophils Relative 08/06/2017 4  % Final  . Basophils Relative 08/06/2017 1  % Final  . Smear Review 08/06/2017 Criteria for review not met   Final  . Sodium 08/06/2017 140  135 - 146 mmol/L Final  . Potassium 08/06/2017 3.8  3.5 - 5.3 mmol/L Final  . Chloride 08/06/2017 104  98 - 110 mmol/L Final  . CO2 08/06/2017 25  20 - 32 mmol/L Final   Comment: ** Please note change in reference range(s). **     . Glucose, Bld 08/06/2017 101* 65 - 99 mg/dL Final  . BUN 08/06/2017 14  7 - 25 mg/dL Final  . Creat 08/06/2017 1.19* 0.50 - 1.05 mg/dL Final   Comment:   For patients > or = 59 years of age: The upper reference limit for Creatinine is approximately 13% higher for people identified as African-American.     . Total Bilirubin  08/06/2017 0.9  0.2 - 1.2 mg/dL Final  . Alkaline Phosphatase 08/06/2017 130  33 - 130 U/L Final  . AST 08/06/2017 42* 10 - 35 U/L Final  .  ALT 08/06/2017 22  6 - 29 U/L Final  . Total Protein 08/06/2017 7.1  6.1 - 8.1 g/dL Final  . Albumin 08/06/2017 3.8  3.6 - 5.1 g/dL Final  . Calcium 08/06/2017 9.4  8.6 - 10.4 mg/dL Final  . GFR, Est African American 08/06/2017 58* >=60 mL/min Final  . GFR, Est Non African American 08/06/2017 50* >=60 mL/min Final  . Prothrombin Time 08/06/2017 11.8* 9.0 - 11.5 sec Final   Comment:   For more information on this test, go to: http://education.quest[facility].com/faq/FAQ104     . INR 08/06/2017 1.1   Final   Comment:   Reference Range                        0.9-1.1 Moderate-intensity Warfarin Therapy    2.0-3.0 Higher-intensity Warfarin Therapy      3.0-4.0       RADIOGRAPHIC STUDIES: MRI scan and ultrasound has been reviewed ASSESSMENT/PLAN 1.  Elevated ferritin of 400 which is stable repeat of many years.  Most likely secondary to cirrhosis of liver and no further investigation needed.[name]  Has been previously investigated.  And her HFE mutation studies were negative. Her elevated ferritins are mostly secondary to chronic liver disease 2.  Cytopenia which is also stable with.  Of 2 years which is most likely secondary to cirrhosis of liver. pancytopenia likely related to liver disease, hepatitis C and hypersplenism. Plan  -no indication for actively treating her thrombocytopenia unless there are any issues with bleeding, platelets count drop to below 30,000 or patient needs to have surgery. If that's the case we might need to restart her treatment on Nplate. -continue following up with [facility] for evaluation and management of hepatitis C. -absolute alcohol cessation and smoking cessation.  3/the patient is chronic smoker and does not want to quit smoking 4.  Has a history of hepatitis C This would be followed  by gastroenterologist. May need evaluation of HIV status if it has not been done in the past According to patient she is being followed by gastroenterologist No return clinic appointment in hematology clinic as patient is being followed by multiple physicians at present time Maculopapular skin rash etiology not clear patient was advised to make an appointment with dermatologist  All questions were answered. The patient knows to call the clinic with any problems, questions or concerns.  This note was electronically signed.    Forest Gleason, MD  11/23/2017 10:02 AM

## 2017-12-17 ENCOUNTER — Ambulatory Visit: Payer: Self-pay | Admitting: Nurse Practitioner

## 2018-02-10 ENCOUNTER — Ambulatory Visit: Payer: Self-pay | Admitting: Nurse Practitioner

## 2018-04-20 ENCOUNTER — Encounter: Payer: Self-pay | Admitting: Internal Medicine

## 2018-04-20 ENCOUNTER — Ambulatory Visit: Payer: Self-pay | Admitting: Nurse Practitioner

## 2018-04-20 ENCOUNTER — Telehealth: Payer: Self-pay | Admitting: Nurse Practitioner

## 2018-04-20 NOTE — Telephone Encounter (Signed)
Ok to send letter with no show policy

## 2018-04-20 NOTE — Telephone Encounter (Signed)
PATIENT WAS A NO SHOW SEVERAL TIMES AND HAS RECEIVED THE NO SHOW POLICY LETTER X2

## 2018-04-21 NOTE — Telephone Encounter (Signed)
Do you mean discharge?. Already received no policy x 2

## 2018-04-21 NOTE — Telephone Encounter (Signed)
[facility] note says alreay missed "several appts"

## 2018-04-21 NOTE — Telephone Encounter (Signed)
Our policy is three no shows

## 2018-04-26 NOTE — Telephone Encounter (Signed)
I know [place] is out this week. I count 3 no-shows, letter provided twice (per [place]). Let me know what you'd like to do and I'll try to help facilitate.

## 2018-04-27 NOTE — Telephone Encounter (Signed)
Recent conversation with site manager states that should be 3 consecutive no shows prior to discharge.

## 2018-05-03 NOTE — Telephone Encounter (Signed)
Patient has an appt scheduled in July if she misses that appt we will discharge at that time

## 2018-07-21 ENCOUNTER — Ambulatory Visit: Payer: Medicaid Other | Admitting: Nurse Practitioner

## 2018-07-21 ENCOUNTER — Ambulatory Visit: Payer: Self-pay | Admitting: Nurse Practitioner

## 2018-10-07 ENCOUNTER — Encounter: Payer: Self-pay | Admitting: Neurology

## 2018-10-07 ENCOUNTER — Ambulatory Visit: Payer: Medicaid Other | Admitting: Neurology

## 2018-11-01 ENCOUNTER — Ambulatory Visit: Payer: Medicaid Other | Admitting: Nurse Practitioner

## 2018-12-06 ENCOUNTER — Encounter

## 2018-12-06 ENCOUNTER — Encounter: Payer: Self-pay | Admitting: Neurology

## 2018-12-06 ENCOUNTER — Ambulatory Visit: Payer: Medicaid Other | Admitting: Neurology

## 2018-12-06 ENCOUNTER — Telehealth: Payer: Self-pay | Admitting: Neurology

## 2018-12-06 VITALS — BP 136/73 | HR 77 | Ht 64.0 in | Wt 168.0 lb

## 2018-12-06 DIAGNOSIS — M25552 Pain in left hip: Secondary | ICD-10-CM | POA: Diagnosis not present

## 2018-12-06 DIAGNOSIS — G8929 Other chronic pain: Secondary | ICD-10-CM | POA: Diagnosis not present

## 2018-12-06 DIAGNOSIS — M25562 Pain in left knee: Secondary | ICD-10-CM | POA: Diagnosis not present

## 2018-12-06 DIAGNOSIS — M5442 Lumbago with sciatica, left side: Secondary | ICD-10-CM | POA: Diagnosis not present

## 2018-12-06 MED ORDER — MELOXICAM 15 MG PO TABS: 15.0000 mg | ORAL_TABLET | Freq: Every day | ORAL | 11 refills | Status: DC

## 2018-12-06 NOTE — Telephone Encounter (Signed)
medicaid order sent to [facility]. They obtain the auth and will reach out to the pt to schedule.  °

## 2018-12-06 NOTE — Telephone Encounter (Signed)
Patient is aware that the order was sent to GI and they will give her a call and if she has heard to give them a call at [phone]

## 2018-12-06 NOTE — Progress Notes (Signed)
PATIENT: Gabriella Leon DOB: 1956-11-23  Chief Complaint  Patient presents with  . Leg Pain    Reports pain in lower left back, left hip, left groin and left leg.  She is also experiencing numbness in her left foot.  Her symptoms are causing her gait difficulty.  States she has been dealing with these problems for at least nine months now.  Denies any known injuries.  [name] PCP    Neale Burly, MD  . Orthopaedics    Case, Reche Dixon, MD - referring MD     HISTORICAL  Gabriella Leon is a 62 years old female, seen in request by orthopedic surgeon Dr. Remo Lipps Case for evaluation of left leg pain, her primary care physician is Dr. Sherrie Sport, Samul Dada, initial evaluation was on December 07, 2018 reviewed   I have reviewed and summarized the referring note from the referring physician.  She had past medical history of hypertension, diabetes, since March 2019, she noticed left leg pain, initially focus on her left knee pain, now she complains of left hip pain, left low back pain, radiating pain to left lateral thigh, also left foot numbness, gait abnormality, with subjective weakness and also major limitation from her left hip and knee pain  She had a history of left cervical decompression surgery in October 2016, she presented with gait abnormality, neck pain, radiating pain to left arm, I personally reviewed MRI of cervical spine prior to the surgery, significant stenosis of C3-4, with evidence of cervical myelomalacia  In recent few months, she also complains of back pain, left leg give out underneath her  REVIEW OF SYSTEMS: Full 14 system review of systems performed and notable only for swelling legs, itching, shortness of breath, cough, feeling cold, allergy, runny nose, skin sensitivity, numbness, restless leg, depression decreased energy All other review of systems were negative.  ALLERGIES: No Known Allergies  HOME MEDICATIONS: Current Outpatient Medications  Medication  Sig Dispense Refill  . folic acid (FOLVITE) 1 MG tablet Take 1 tablet (1 mg total) by mouth daily. 30 tablet 11  . furosemide (LASIX) 40 MG tablet Take 40 mg by mouth daily.     [name] glimepiride (AMARYL) 2 MG tablet Take 2 mg by mouth daily.     [name] HYDROcodone-acetaminophen (NORCO/VICODIN) 5-325 MG per tablet Take 1 tablet by mouth every 6 (six) hours as needed for moderate pain.    [name] lisinopril-hydrochlorothiazide (PRINZIDE,ZESTORETIC) 10-12.5 MG per tablet Take 1 tablet by mouth daily.    [name] omeprazole (PRILOSEC) 20 MG capsule TAKE 1 CAPSULE BY MOUTH TWICE DAILY BEFORE A MEAL 60 capsule 5  . phytonadione (VITAMIN K) 5 MG tablet Take 1 tablet (5 mg total) by mouth daily. 10 tablet 0  . ranitidine (ZANTAC) 300 MG capsule Take 300 mg by mouth daily.     No current facility-administered medications for this visit.     PAST MEDICAL HISTORY: Past Medical History:  Diagnosis Date  . Acid reflux disease with ulcer   . Anxiety   . Arthritis   . Blood dyscrasia    thrombocytopenia-next tx injection 09/25/15  . Blood transfusion   . Chronic kidney disease   . Cirrhosis ([place])   . DDD (degenerative disc disease), lumbar   . Depression   . Diabetes mellitus   . Esophageal varices ([place]) 09/18/2014  . Folate deficiency 03/28/2014   Noted on 03/23/2014.  Folate 1 mg prescribed.  . Gallstones   . GERD (gastroesophageal reflux disease)   .  Headache    migraines  . Helicobacter pylori ab+    July 2010, s/p treatment  . Hepatitis B antibody positive   . Hepatitis C    and B positive antibody  . History of alcoholism ([place])   . Hypertension   . Left leg pain   . Liver cirrhosis ([place])   . Low back pain   . Other pancytopenia ([place]) 10/27/2013  . Ulcer    ?    PAST SURGICAL HISTORY: Past Surgical History:  Procedure Laterality Date  . ANTERIOR CERVICAL DECOMP/DISCECTOMY FUSION N/A 09/28/2015   Procedure: Cervical three-four Anterior cervical decompression/diskectomy/fusion;  Surgeon: Leeroy Cha,  MD;  Location: [facility];  Service: Neurosurgery;  Laterality: N/A;  C3-4 Anterior cervical decompression/diskectomy/fusion  . APPENDECTOMY     age 11  . BIOPSY  12/18/2011      . [place]  greater than 10 yrs   [place]  . COLONOSCOPY  12/18/11   minimal anal canal hemorrhoids, friable rectal and colonic mucosa, left-sided diverticulosis, repeat in 2022.   . ESOPHAGOGASTRODUODENOSCOPY  12/18/11   3 columns of Grade II esophageal varices, reflux esohpagitis, portal gastropathy, path with chronic gastritis, negative H.pylori, surveillance in June 2014  . ESOPHAGOGASTRODUODENOSCOPY (EGD) WITH PROPOFOL N/A 04/13/2014   Dr. [name]: grade 1-2 varices, hiatal hernia, portal gastropathy. NO need for further surveillance while on prophylaxis unless evidence of bleeding  . MULTIPLE EXTRACTIONS WITH ALVEOLOPLASTY  03/08/2012   Procedure: MULTIPLE EXTRACION WITH ALVEOLOPLASTY;  Surgeon: Gae Bon, DDS;  Location: [facility];  Service: Oral Surgery;  Laterality: N/A;    FAMILY HISTORY: Family History  Problem Relation Age of Onset  . Diabetes Mother   . Cancer Paternal Uncle        passed away age 55  . Other Father        died in [place] in 84  . Anesthesia problems Neg Hx   . Hypotension Neg Hx   . Malignant hyperthermia Neg Hx   . Pseudochol deficiency Neg Hx     SOCIAL HISTORY: Social History   Socioeconomic History  . Marital status: Legally Separated    Spouse name: Not on file  . Number of children: 1  . Years of education: 10th grade  . Highest education level: Not on file  Occupational History  . Occupation: disabled    Fish farm manager: NOT EMPLOYED  Social Needs  . Financial resource strain: Not on file  . Food insecurity:    Worry: Not on file    Inability: Not on file  . Transportation needs:    Medical: Not on file    Non-medical: Not on file  Tobacco Use  . Smoking status: Current Every Day Smoker    Packs/day: 0.50    Years:  20.00    Pack years: 10.00    Types: Cigarettes  . Smokeless tobacco: Never Used  Substance and Sexual Activity  . Alcohol use: No    Comment: last use 2015  . Drug use: No  . Sexual activity: Yes    Birth control/protection: Post-menopausal  Lifestyle  . Physical activity:    Days per week: Not on file    Minutes per session: Not on file  . Stress: Not on file  Relationships  . Social connections:    Talks on phone: Not on file    Gets together: Not on file    Attends religious service: Not on file    Active  member of club or organization: Not on file    Attends meetings of clubs or organizations: Not on file    Relationship status: Not on file  . Intimate partner violence:    Fear of current or ex partner: Not on file    Emotionally abused: Not on file    Physically abused: Not on file    Forced sexual activity: Not on file  Other Topics Concern  . Not on file  Social History Narrative   Lives at home alone.   Right-handed.   Occasional caffeine use.     PHYSICAL EXAM   Vitals:   12/06/18 0945  BP: 136/73  Pulse: 77  Weight: 168 lb (76.2 kg)  Height: 5\' 4"  (1.626 m)    Not recorded      Body mass index is 28.84 kg/m.  PHYSICAL EXAMNIATION:  Gen: NAD, conversant, well nourised, obese, well groomed                     Cardiovascular: Regular rate rhythm, no peripheral edema, warm, nontender. Eyes: Conjunctivae clear without exudates or hemorrhage Neck: Supple, no carotid bruits. Pulmonary: Clear to auscultation bilaterally   NEUROLOGICAL EXAM:  MENTAL STATUS: Speech:    Speech is normal; fluent and spontaneous with normal comprehension.  Cognition:     Orientation to time, place and person     Normal recent and remote memory     Normal Attention span and concentration     Normal Language, naming, repeating,spontaneous speech     Fund of knowledge   CRANIAL NERVES: CN II: Visual fields are full to confrontation. Fundoscopic exam is normal with  sharp discs and no vascular changes. Pupils are round equal and briskly reactive to light. CN III, IV, VI: extraocular movement are normal. No ptosis. CN V: Facial sensation is intact to pinprick in all 3 divisions bilaterally. Corneal responses are intact.  CN VII: Face is symmetric with normal eye closure and smile. CN VIII: Hearing is normal to rubbing fingers CN IX, X: Palate elevates symmetrically. Phonation is normal. CN XI: Head turning and shoulder shrug are intact CN XII: Tongue is midline with normal movements and no atrophy.  MOTOR: No significant upper extremity weakness, examination of bilateral lower extremities limited by her multiple joints pain, especially left knee pain, also complains of significant left hip pain, There was no significant bilateral lower extremity proximal distal muscle weakness.  REFLEXES: Reflexes are 2+ and symmetric at the biceps, triceps, knees, and ankles. Plantar responses are flexor.  SENSORY: Intact to light touch, pinprick, positional sensation and vibratory sensation are intact in fingers and toes.  COORDINATION: Rapid alternating movements and fine finger movements are intact. There is no dysmetria on finger-to-nose and heel-knee-shin.    GAIT/STANCE: She needs pushed up to get up from seated position, antalgic, cautious   DIAGNOSTIC DATA (LABS, IMAGING, TESTING) - I reviewed patient records, labs, notes, testing and imaging myself where available.   ASSESSMENT AND PLAN  Mialee Leon is a 62 y.o. female   Left lower extremity radiating pain  Possible left lumbar radiculopathy also need to rule out left calf knee and hip pathology  Complete evaluation with x-ray of left knee, left hip,  MRI of lumbar spine  EMG nerve conduction study  Mobic 15 mg as needed,     Marcial Pacas, M.D. Ph.D.  [facility] [address], [place], [place] [address] Ph: [phone] Fax: [phone]  CC: Stoney Bang  A, MD, Case, Reche Dixon, MD

## 2018-12-16 ENCOUNTER — Telehealth: Payer: Self-pay | Admitting: Neurology

## 2018-12-16 NOTE — Telephone Encounter (Signed)
-----   Message from Otilio Saber sent at 12/16/2018 12:11 PM EST ----- Regarding: FW: Prior Auth Denied  ----- Message ----- From: Sandre Kitty Sent: 12/16/2018  11:42 AM EST To: Jamse Belfast, # Subject: Prior [place] denied this patient's exam.   Will the provider do a peer to peer, or should the exam be canceled?  INS:      Medicaid DOS:    12/21/18 CPT:      69629   Denial:  Based on [facility] [facility] Guidelines Section: SP 6.1 Lower Extremity Pain with Neurological Features (Radiculopathy or Radiculitis or Plexopathy and/or Neuropathy) with or without Low Back (Lumbar Spine) Pain, we cannot approve this request. Your records show that you are having lower back pain that travels to your hip and/or leg. The reason this request cannot be approved is because: 1. MRI might be supported in the evaluation of suspected or known spinal disease with one of the following. -Failure to improve after a recent (within three months) six week trial of physician-guided clinical care with clinical re-evaluation. -Any signs or symptoms such as significant motor weakness, malignancy, infection, cauda equina syndrome, for which conservative treatment is not needed. The clinical information received fails to support meeting these requirements and, therefore, the request is not indicated at this time.         Tax ID:  [id number] Thank you, Sandre Kitty

## 2018-12-16 NOTE — Telephone Encounter (Signed)
Please let her know insurance company denies MRI of lumbar at this point, she is having EMG nerve conduction study scheduled on January 17, 2018, will make a decision about imaging study after that evaluation

## 2018-12-20 NOTE — Telephone Encounter (Signed)
Error

## 2018-12-20 NOTE — Telephone Encounter (Signed)
Patient called back and I explained to her that it was denied and apt was canceled. Patient would like to speak with the nurse regarding what she should do from here about her back pain. Please call and advise. [phone]

## 2018-12-20 NOTE — Telephone Encounter (Signed)
Spoke to patient - she should keep her pending appt fo NCV/EMG and continue meloxicam.  She is agreeable to this plan.

## 2018-12-20 NOTE — Telephone Encounter (Signed)
[facility] called back again today wanting to know how to proceed with this patients apt and stated that this patient has been denied for the MRI. A staff message was sent on on 12/26 and forwarded to [facility] and Dr. Krista Blue. Dr. Krista Blue has advised to cancel the apt and call the patient back to make her aware. I have called the patient and left her a VM (ok per DPR).

## 2018-12-21 ENCOUNTER — Inpatient Hospital Stay: Admission: RE | Admit: 2018-12-21 | Payer: Medicaid Other | Source: Ambulatory Visit

## 2018-12-21 ENCOUNTER — Other Ambulatory Visit: Payer: Medicaid Other

## 2019-01-17 ENCOUNTER — Encounter: Payer: Medicaid Other | Admitting: Neurology

## 2019-01-17 ENCOUNTER — Telehealth: Payer: Self-pay | Admitting: *Deleted

## 2019-01-17 NOTE — Telephone Encounter (Signed)
No showed NCV/EMG appt. 

## 2019-01-18 ENCOUNTER — Encounter: Payer: Self-pay | Admitting: Neurology

## 2019-01-31 ENCOUNTER — Telehealth: Payer: Self-pay | Admitting: *Deleted

## 2019-01-31 ENCOUNTER — Other Ambulatory Visit: Payer: Self-pay | Admitting: *Deleted

## 2019-01-31 ENCOUNTER — Encounter: Payer: Self-pay | Admitting: Nurse Practitioner

## 2019-01-31 ENCOUNTER — Encounter: Payer: Self-pay | Admitting: *Deleted

## 2019-01-31 ENCOUNTER — Ambulatory Visit (INDEPENDENT_AMBULATORY_CARE_PROVIDER_SITE_OTHER): Payer: Medicare Other | Admitting: Nurse Practitioner

## 2019-01-31 VITALS — BP 171/85 | HR 90 | Temp 97.7°F | Ht 64.0 in | Wt 167.4 lb

## 2019-01-31 DIAGNOSIS — B182 Chronic viral hepatitis C: Secondary | ICD-10-CM | POA: Diagnosis not present

## 2019-01-31 DIAGNOSIS — K7460 Unspecified cirrhosis of liver: Principal | ICD-10-CM

## 2019-01-31 DIAGNOSIS — I8510 Secondary esophageal varices without bleeding: Secondary | ICD-10-CM | POA: Diagnosis not present

## 2019-01-31 MED ORDER — PROPRANOLOL HCL 20 MG PO TABS: 20.0000 mg | ORAL_TABLET | Freq: Two times a day (BID) | ORAL | 5 refills | Status: DC

## 2019-01-31 NOTE — Patient Instructions (Signed)
Your health issues we discussed today were:   Cirrhosis with esophageal varices (swollen blood vessels in your swallowing tube): 1. Have your labs done when you are able to. 2. We will help schedule your ultrasound for you. 3. We will schedule your upper endoscopy to check for swollen blood vessels for you 4. I have sent a new prescription for propranolol 20 mg.  Take this once in the morning and once in the evening. 5. Further recommendations will follow your procedure, labs, imaging 6. Call us for any worsening or concerning symptoms  Overall I recommend:  1. Follow-up in 6 months 2. Call us if you have any questions or concerns.  At [facility] we value your feedback. You may receive a survey about your visit today. Please share your experience as we strive to create trusting relationships with our patients to provide genuine, compassionate, quality care.  We appreciate your understanding and patience as we review any laboratory studies, imaging, and other diagnostic tests that are ordered as we care for you. Our office policy is 5 business days for review of these results, and any emergent or urgent results are addressed in a timely manner for your best interest. If you do not hear from our office in 1 week, please contact us.   We also encourage the use of MyChart, which contains your medical information for your review as well. If you are not enrolled in this feature, an access code is on this after visit summary for your convenience. Thank you for allowing [place] to be involved in your care.  It was great to see you today!  I hope you have a great day!!

## 2019-01-31 NOTE — Telephone Encounter (Signed)
Spoke with patient and she is scheduled for EGD +/-BANDING with MAC on 03/24/2019 at 1:30pm. Aware will call back with pre-op appt and will mail instructions.

## 2019-01-31 NOTE — Telephone Encounter (Signed)
Checked [employer] website for u/s PA for pt and received message "NOTE: This [facility] member does not require prior authorization for OUTPATIENT Radiology through [place] or [facility] at this time."

## 2019-01-31 NOTE — Telephone Encounter (Signed)
Pre-op scheduled for 3/26 at 10:00am. Letter mailed with instructions. LMOVM.

## 2019-01-31 NOTE — Progress Notes (Signed)
cc'd to pcp 

## 2019-01-31 NOTE — Assessment & Plan Note (Signed)
Known cirrhosis due to previous hepatitis C which was treated in June 2016 with documented SVR.  She has missed multiple appointments and has not been seen in our office since August 2018.  At this point she is well overdue for follow-up routine liver labs and right upper quadrant ultrasound.  Her edema is improved compared to phone notes around her previous office visit.  She is generally asymptomatic other than some itching which she feels is related to nerves.  She does have a decreased appetite for unknown reasons.  She is not taking protein supplementation as previously recommended.  At this time we will check labs and ultrasound, recommend start protein supplement to keep her protein mass adequate.  Continue current diuretics.  We will restart propranolol as per below.  Follow-up in 6 months.

## 2019-01-31 NOTE — Progress Notes (Signed)
Referring Provider: Neale Burly, MD Primary Care Physician:  Neale Burly, MD Primary GI:  Dr. [name]  Chief Complaint  Patient presents with  . Cirrhosis    HPI:   Gabriella Leon is a 63 y.o. female who presents for follow-up on abdominal swelling and cirrhosis.  The patient was last seen in our office 08/06/2017 for cirrhosis.  Noted history of hepatitis C cirrhosis status post treatment and eradication in June 2016.  Followed by oncology due to elevated ferritin felt to be secondary to known liver disease.  Known SVR.  On propranolol twice daily.  Amitiza twice daily for constipation.  Ultrasound in October 2017 with dilated CBD at 9.3 mm to questionable debris in the bile duct.  MRCP was limited due to patient requesting and prior to study.  However, no obvious stones.  At her last visit she was due for abdominal ultrasound for [facility] screening and updated labs.  Appetite not good since husband passed away, notes swelling in her abdomen.  Known history of esophageal varices with nonselective beta-blocker prophylaxis.  Recommended updated liver labs, increase propranolol to 2 tablets in the morning and 1 tablet in the evening due to heart rate not at goal, protein supplementation, abdominal ultrasound.  Query taper down on diuretics if no ascites.  Follow-up in 6 weeks due to weight loss.  Follow-up with hematology.  Labs completed 08/06/2017 which found normal INR, hemoglobin at baseline at 11.0, platelets stable at 55.  Meld score calculated at 9.  Right upper quadrant ultrasound completed 08/19/2017 which found known cirrhosis without mass, postcholecystectomy, stable mild CBD dilation questionably containing debris within the common bile duct.  Called our office after ultrasound inquiring about the results and to note that her legs were swelling as well as abdominal swelling.  She noted she was on Lasix 40 mg daily and Aldactone 50 mg twice a day.  Recommended increase Lasix and  Aldactone to twice a day for 3 days then back to once daily.  Strict low-sodium diet, no more than 2 g.  The patient was a no-show to her follow-up office visit dated 08/26/2017.  There was concern for 3 no-shows and no-show policy letter mailed twice.  On May 03, 2018 indicated appointment in July and if she misses that then we would discharge at that time.  Today she states she's doing ok overall. Having left hip pain r/t arthritis. Denies abdominal pain, N/V, hematochezia, melena, fever, chills, unintentional weight loss. Appetite is fair, not much improvement; no identifiable symptoms contributing, "I just don't always want to eat." Not drinking protein supplements as previously recommended. Denies yellowing of skin/eyes, darkened urine, acute episodic confusion, tremors/shakes. Has itching "all over, I don't know if it's nerves or what." No swelling currently. Still on her diuretics. Not on propranolol "for quite a while now." Has intermittent dyspnea, was recently in the hospital for CHF ([facility]), feels it's a bit worse than normal (especially at night). States she isn't seeing a cardiologist, is managed by PCP. Denies chest pain, dyspnea, dizziness, lightheadedness, syncope, near syncope. Denies any other upper or lower GI symptoms.  Past Medical History:  Diagnosis Date  . Acid reflux disease with ulcer   . Anxiety   . Arthritis   . Blood dyscrasia    thrombocytopenia-next tx injection 09/25/15  . Blood transfusion   . Chronic kidney disease   . Cirrhosis ([place])   . DDD (degenerative disc disease), lumbar   . Depression   . Diabetes  mellitus   . Esophageal varices ([place]) 09/18/2014  . Folate deficiency 03/28/2014   Noted on 03/23/2014.  Folate 1 mg prescribed.  . Gallstones   . GERD (gastroesophageal reflux disease)   . Headache    migraines  . Helicobacter pylori ab+    July 2010, s/p treatment  . Hepatitis B antibody positive   . Hepatitis C    and B positive antibody  .  History of alcoholism ([place])   . Hypertension   . Left leg pain   . Liver cirrhosis ([place])   . Low back pain   . Other pancytopenia ([place]) 10/27/2013  . Ulcer    ?    Past Surgical History:  Procedure Laterality Date  . ANTERIOR CERVICAL DECOMP/DISCECTOMY FUSION N/A 09/28/2015   Procedure: Cervical three-four Anterior cervical decompression/diskectomy/fusion;  Surgeon: Leeroy Cha, MD;  Location: [place] [facility];  Service: Neurosurgery;  Laterality: N/A;  C3-4 Anterior cervical decompression/diskectomy/fusion  . APPENDECTOMY     age 75  . BIOPSY  12/18/2011      . [place]  greater than 10 yrs   [place]  . COLONOSCOPY  12/18/11   minimal anal canal hemorrhoids, friable rectal and colonic mucosa, left-sided diverticulosis, repeat in 2022.   . ESOPHAGOGASTRODUODENOSCOPY  12/18/11   3 columns of Grade II esophageal varices, reflux esohpagitis, portal gastropathy, path with chronic gastritis, negative H.pylori, surveillance in June 2014  . ESOPHAGOGASTRODUODENOSCOPY (EGD) WITH PROPOFOL N/A 04/13/2014   Dr. [name]: grade 1-2 varices, hiatal hernia, portal gastropathy. NO need for further surveillance while on prophylaxis unless evidence of bleeding  . MULTIPLE EXTRACTIONS WITH ALVEOLOPLASTY  03/08/2012   Procedure: MULTIPLE EXTRACION WITH ALVEOLOPLASTY;  Surgeon: Gae Bon, DDS;  Location: [place];  Service: Oral Surgery;  Laterality: N/A;    Current Outpatient Medications  Medication Sig Dispense Refill  . folic acid (FOLVITE) 1 MG tablet Take 1 tablet (1 mg total) by mouth daily. 30 tablet 11  . furosemide (LASIX) 40 MG tablet Take 40 mg by mouth daily.     [name] glimepiride (AMARYL) 2 MG tablet Take 2 mg by mouth daily.     [name] HYDROcodone-acetaminophen (NORCO/VICODIN) 5-325 MG per tablet Take 1 tablet by mouth every 6 (six) hours as needed for moderate pain.    [name] lisinopril-hydrochlorothiazide (PRINZIDE,ZESTORETIC) 10-12.5 MG per tablet Take 1 tablet  by mouth daily.    . meloxicam (MOBIC) 15 MG tablet Take 1 tablet (15 mg total) by mouth daily. 30 tablet 11  . omeprazole (PRILOSEC) 20 MG capsule TAKE 1 CAPSULE BY MOUTH TWICE DAILY BEFORE A MEAL 60 capsule 5  . phytonadione (VITAMIN K) 5 MG tablet Take 1 tablet (5 mg total) by mouth daily. 10 tablet 0  . ranitidine (ZANTAC) 300 MG capsule Take 300 mg by mouth daily.     No current facility-administered medications for this visit.     Allergies as of 01/31/2019  . (No Known Allergies)    Family History  Problem Relation Age of Onset  . Diabetes Mother   . Cancer Paternal Uncle        passed away age 32  . Other Father        died in [place] in 75  . Anesthesia problems Neg Hx   . Hypotension Neg Hx   . Malignant hyperthermia Neg Hx   . Pseudochol deficiency Neg Hx   . Colon cancer Neg Hx   . Liver disease Neg Hx  Social History   Socioeconomic History  . Marital status: Legally Separated    Spouse name: Not on file  . Number of children: 1  . Years of education: 10th grade  . Highest education level: Not on file  Occupational History  . Occupation: disabled    Fish farm manager: NOT EMPLOYED  Social Needs  . Financial resource strain: Not on file  . Food insecurity:    Worry: Not on file    Inability: Not on file  . Transportation needs:    Medical: Not on file    Non-medical: Not on file  Tobacco Use  . Smoking status: Current Every Day Smoker    Packs/day: 0.50    Years: 20.00    Pack years: 10.00    Types: Cigarettes  . Smokeless tobacco: Never Used  Substance and Sexual Activity  . Alcohol use: No    Comment: last use 2015  . Drug use: No  . Sexual activity: Yes    Birth control/protection: Post-menopausal  Lifestyle  . Physical activity:    Days per week: Not on file    Minutes per session: Not on file  . Stress: Not on file  Relationships  . Social connections:    Talks on phone: Not on file    Gets together: Not on file    Attends religious  service: Not on file    Active member of club or organization: Not on file    Attends meetings of clubs or organizations: Not on file    Relationship status: Not on file  Other Topics Concern  . Not on file  Social History Narrative   Lives at home alone.   Right-handed.   Occasional caffeine use.    Review of Systems: General: Negative for anorexia, weight loss, fever, chills, fatigue, weakness. ENT: Negative for hoarseness, difficulty swallowing. CV: Negative for chest pain, angina, palpitations, peripheral edema.  Respiratory: Negative for dyspnea at rest, cough, sputum, wheezing.  GI: See history of present illness. MS: Negative for joint pain, low back pain.  Derm: Negative for rash or itching.  Neuro: Negative for memory loss, confusion.  Endo: Negative for unusual weight change.  Heme: Negative for bruising or bleeding. Allergy: Negative for rash or hives.   Physical Exam: BP (!) 171/85   Pulse 90   Temp 97.7 F (36.5 C) (Oral)   Ht 5\' 4"  (1.626 m)   Wt 167 lb 6.4 oz (75.9 kg)   BMI 28.73 kg/m  General:   Alert and oriented. Pleasant and cooperative. Well-nourished and well-developed.  Eyes:  Without icterus, sclera clear and conjunctiva pink.  Ears:  Normal auditory acuity. Cardiovascular:  S1, S2 present without murmurs appreciated. Extremities without clubbing or edema. Respiratory:  Clear to auscultation bilaterally. No wheezes, rales, or rhonchi. No distress.  Gastrointestinal:  +BS, soft, non-tender and non-distended. No splemomegaly noted. Noted hepatomegaly approximately 4 fingerbreadths below the right costal margin. No guarding or rebound. No masses appreciated.  Rectal:  Deferred  Musculoskalatal:  Symmetrical without gross deformities. Skin:  Intact without significant lesions or rashes. Neurologic:  Alert and oriented x4;  grossly normal neurologically. Psych:  Alert and cooperative. Normal mood and affect. Heme/Lymph/Immune: No excessive bruising  noted.    01/31/2019 11:33 AM   Disclaimer: This note was dictated with voice recognition software. Similar sounding words can inadvertently be transcribed and may not be corrected upon review.

## 2019-01-31 NOTE — Assessment & Plan Note (Signed)
Most recent EGD found grade 1 and grade 2 esophageal varices.  She was started on propranolol and when we last saw her dosage adjustments are being made to target heart rate.  She states she has not taken propranolol "in quite some time."  Her heart rate today is 90.  She is a bit hypertensive as well with a blood pressure of 171/85.  At this time, given the previous varices, I feel it is best to proceed with an EGD to check for any enlargement.  I will also start her on propranolol at her previous dose.  Follow-up in 6 months and plan for dose titration as needed.  Proceed with EGD +/- variceal banding on propofol/MAC with Dr. [name] in near future: the risks, benefits, and alternatives have been discussed with the patient in detail. The patient states understanding and desires to proceed.  The patient is currently on hydrocodone.  History of alcohol abuse.  No other anticoagulants, anxiolytics, chronic pain medications, or antidepressants.  We will plan for the procedure on propofol/MAC to promote adequate sedation.

## 2019-02-07 ENCOUNTER — Ambulatory Visit (HOSPITAL_COMMUNITY)
Admission: RE | Admit: 2019-02-07 | Discharge: 2019-02-07 | Disposition: A | Discharge status: Home / Self Care | Payer: Medicare Other | Source: Ambulatory Visit | Attending: Nurse Practitioner | Admitting: Nurse Practitioner

## 2019-02-07 ENCOUNTER — Other Ambulatory Visit (HOSPITAL_COMMUNITY)
Admission: RE | Admit: 2019-02-07 | Discharge: 2019-02-07 | Disposition: A | Discharge status: Home / Self Care | Payer: Medicare Other | Source: Ambulatory Visit | Attending: Nurse Practitioner | Admitting: Nurse Practitioner

## 2019-02-07 DIAGNOSIS — B182 Chronic viral hepatitis C: Secondary | ICD-10-CM | POA: Diagnosis not present

## 2019-02-07 DIAGNOSIS — K7460 Unspecified cirrhosis of liver: Secondary | ICD-10-CM | POA: Diagnosis not present

## 2019-02-07 DIAGNOSIS — I8510 Secondary esophageal varices without bleeding: Secondary | ICD-10-CM | POA: Diagnosis not present

## 2019-02-15 ENCOUNTER — Telehealth: Payer: Self-pay

## 2019-02-15 NOTE — Telephone Encounter (Signed)
Pt called requesting U/S results- she can be reached [phone]

## 2019-02-16 NOTE — Telephone Encounter (Signed)
Pt is inquiring about her u/s results.

## 2019-02-17 ENCOUNTER — Telehealth: Payer: Self-pay | Admitting: Internal Medicine

## 2019-02-17 NOTE — Telephone Encounter (Signed)
Pt was calling to see if her U/S results were back . Please call [phone]

## 2019-02-17 NOTE — Telephone Encounter (Signed)
Pt notified that a message has been sent to a provider.

## 2019-02-18 NOTE — Telephone Encounter (Signed)
See result note for details.

## 2019-02-21 NOTE — Telephone Encounter (Signed)
Noted  

## 2019-02-24 DIAGNOSIS — M1612 Unilateral primary osteoarthritis, left hip: Secondary | ICD-10-CM | POA: Diagnosis not present

## 2019-02-24 DIAGNOSIS — M5410 Radiculopathy, site unspecified: Secondary | ICD-10-CM | POA: Diagnosis not present

## 2019-02-28 ENCOUNTER — Encounter: Payer: Medicaid Other | Admitting: Neurology

## 2019-02-28 ENCOUNTER — Telehealth: Payer: Self-pay | Admitting: *Deleted

## 2019-02-28 ENCOUNTER — Encounter: Payer: Self-pay | Admitting: Neurology

## 2019-02-28 NOTE — Telephone Encounter (Signed)
No showed NCV/EMG appointment. 

## 2019-03-16 NOTE — Patient Instructions (Signed)
Gabriella Leon  03/16/2019     @PREFPERIOPPHARMACY @   Your procedure is scheduled on  03/24/2019.  Report to [facility] at   1000  A.M.  Call this number if you have problems the morning of surgery:  [phone]   Remember:  Follow the diet instructions given to you by Dr Roseanne Kaufman office.                        Take these medicines the morning of surgery with A SIP OF WATER  Hydrocodone( if needed), mobic(if needed), prilosec, propranolol, Zantac.    Do not wear jewelry, make-up or nail polish.  Do not wear lotions, powders, or perfumes, or deodorant.  Do not shave 48 hours prior to surgery.  Men may shave face and neck.  Do not bring valuables to the hospital.  [facility] is not responsible for any belongings or valuables.  Contacts, dentures or bridgework may not be worn into surgery.  Leave your suitcase in the car.  After surgery it may be brought to your room.  For patients admitted to the hospital, discharge time will be determined by your treatment team.  Patients discharged the day of surgery will not be allowed to drive home.   Name and phone number of your driver:   family Special instructions:  None  Please read over the following fact sheets that you were given. Anesthesia Post-op Instructions and Care and Recovery After Surgery       Upper Endoscopy, Adult, Care After This sheet gives you information about how to care for yourself after your procedure. Your health care provider may also give you more specific instructions. If you have problems or questions, contact your health care provider. What can I expect after the procedure? After the procedure, it is common to have:  A sore throat.  Mild stomach pain or discomfort.  Bloating.  Nausea. Follow these instructions at home:   Follow instructions from your health care provider about what to eat or drink after your procedure.  Return to your normal activities as told by your health  care provider. Ask your health care provider what activities are safe for you.  Take over-the-counter and prescription medicines only as told by your health care provider.  Do not drive for 24 hours if you were given a sedative during your procedure.  Keep all follow-up visits as told by your health care provider. This is important. Contact a health care provider if you have:  A sore throat that lasts longer than one day.  Trouble swallowing. Get help right away if:  You vomit blood or your vomit looks like coffee grounds.  You have: ? A fever. ? Bloody, black, or tarry stools. ? A severe sore throat or you cannot swallow. ? Difficulty breathing. ? Severe pain in your chest or abdomen. Summary  After the procedure, it is common to have a sore throat, mild stomach discomfort, bloating, and nausea.  Do not drive for 24 hours if you were given a sedative during the procedure.  Follow instructions from your health care provider about what to eat or drink after your procedure.  Return to your normal activities as told by your health care provider. This information is not intended to replace advice given to you by your health care provider. Make sure you discuss any questions you have with your health care provider. Document Released: 06/08/2012 Document Revised: 05/10/2018 Document Reviewed: 05/10/2018  Elsevier Interactive Patient Education  2019 [place], Care After These instructions provide you with information about caring for yourself after your procedure. Your health care provider may also give you more specific instructions. Your treatment has been planned according to current medical practices, but problems sometimes occur. Call your health care provider if you have any problems or questions after your procedure. What can I expect after the procedure? After your procedure, you may:  Feel sleepy for several hours.  Feel clumsy and have poor  balance for several hours.  Feel forgetful about what happened after the procedure.  Have poor judgment for several hours.  Feel nauseous or vomit.  Have a sore throat if you had a breathing tube during the procedure. Follow these instructions at home: For at least 24 hours after the procedure:      Have a responsible adult stay with you. It is important to have someone help care for you until you are awake and alert.  Rest as needed.  Do not: ? Participate in activities in which you could fall or become injured. ? Drive. ? Use heavy machinery. ? Drink alcohol. ? Take sleeping pills or medicines that cause drowsiness. ? Make important decisions or sign legal documents. ? Take care of children on your own. Eating and drinking  Follow the diet that is recommended by your health care provider.  If you vomit, drink water, juice, or soup when you can drink without vomiting.  Make sure you have little or no nausea before eating solid foods. General instructions  Take over-the-counter and prescription medicines only as told by your health care provider.  If you have sleep apnea, surgery and certain medicines can increase your risk for breathing problems. Follow instructions from your health care provider about wearing your sleep device: ? Anytime you are sleeping, including during daytime naps. ? While taking prescription pain medicines, sleeping medicines, or medicines that make you drowsy.  If you smoke, do not smoke without supervision.  Keep all follow-up visits as told by your health care provider. This is important. Contact a health care provider if:  You keep feeling nauseous or you keep vomiting.  You feel light-headed.  You develop a rash.  You have a fever. Get help right away if:  You have trouble breathing. Summary  For several hours after your procedure, you may feel sleepy and have poor judgment.  Have a responsible adult stay with you for at least  24 hours or until you are awake and alert. This information is not intended to replace advice given to you by your health care provider. Make sure you discuss any questions you have with your health care provider. Document Released: 03/30/2016 Document Revised: 07/24/2017 Document Reviewed: 03/30/2016 Elsevier Interactive Patient Education  2019 [employer].

## 2019-03-17 ENCOUNTER — Other Ambulatory Visit: Payer: Self-pay

## 2019-03-17 ENCOUNTER — Encounter (HOSPITAL_COMMUNITY): Payer: Self-pay

## 2019-03-17 ENCOUNTER — Encounter (HOSPITAL_COMMUNITY)
Admission: RE | Admit: 2019-03-17 | Discharge: 2019-03-17 | Disposition: A | Discharge status: Home / Self Care | Payer: Medicare Other | Source: Ambulatory Visit | Attending: Gastroenterology | Admitting: Gastroenterology

## 2019-03-17 DIAGNOSIS — Z01812 Encounter for preprocedural laboratory examination: Secondary | ICD-10-CM | POA: Diagnosis not present

## 2019-03-17 HISTORY — DX: Personal history of urinary calculi: Z87.442

## 2019-03-17 LAB — COMPREHENSIVE METABOLIC PANEL
ALT: 22 U/L (ref 0–44)
AST: 43 U/L — ABNORMAL HIGH (ref 15–41)
Albumin: 3.3 g/dL — ABNORMAL LOW (ref 3.5–5.0)
Alkaline Phosphatase: 160 U/L — ABNORMAL HIGH (ref 38–126)
Anion gap: 8 (ref 5–15)
BUN: 14 mg/dL (ref 6–20)
CHLORIDE: 103 mmol/L (ref 98–111)
CO2: 26 mmol/L (ref 22–32)
Calcium: 9.1 mg/dL (ref 8.9–10.3)
Creatinine, Ser: 1.01 mg/dL — ABNORMAL HIGH (ref 0.44–1.00)
GFR calc Af Amer: >60 mL/min (ref >60)
GFR calc non Af Amer: >60 mL/min (ref >60)
Glucose, Bld: 119 mg/dL — ABNORMAL HIGH (ref 70–99)
Potassium: 3.8 mmol/L (ref 3.5–5.1)
Sodium: 137 mmol/L (ref 135–145)
Total Bilirubin: 1.8 mg/dL — ABNORMAL HIGH (ref 0.3–1.2)
Total Protein: 7.4 g/dL (ref 6.5–8.1)

## 2019-03-17 LAB — CBC WITH DIFFERENTIAL/PLATELET
ABS IMMATURE GRANULOCYTES: 0 10*3/uL (ref 0.00–0.07)
Basophils Absolute: 0 10*3/uL (ref 0.0–0.1)
Basophils Relative: 1 %
Eosinophils Absolute: 0.2 10*3/uL (ref 0.0–0.5)
Eosinophils Relative: 7 %
HCT: 33.7 % — ABNORMAL LOW (ref 36.0–46.0)
Hemoglobin: 11.2 g/dL — ABNORMAL LOW (ref 12.0–15.0)
Immature Granulocytes: 0 %
Lymphocytes Relative: 30 %
Lymphs Abs: 0.8 10*3/uL (ref 0.7–4.0)
MCH: 34.4 pg — ABNORMAL HIGH (ref 26.0–34.0)
MCHC: 33.2 g/dL (ref 30.0–36.0)
MCV: 103.4 fL — ABNORMAL HIGH (ref 80.0–100.0)
Monocytes Absolute: 0.3 10*3/uL (ref 0.1–1.0)
Monocytes Relative: 12 %
NEUTROS PCT: 50 %
Neutro Abs: 1.4 10*3/uL — ABNORMAL LOW (ref 1.7–7.7)
Platelets: 66 10*3/uL — ABNORMAL LOW (ref 150–400)
RBC: 3.26 MIL/uL — AB (ref 3.87–5.11)
RDW: 13.6 % (ref 11.5–15.5)
WBC: 2.8 10*3/uL — ABNORMAL LOW (ref 4.0–10.5)
nRBC: 0 % (ref 0.0–0.2)

## 2019-03-17 LAB — PROTIME-INR
INR: 1.2 (ref 0.8–1.2)
Prothrombin Time: 14.9 seconds (ref 11.4–15.2)

## 2019-03-18 LAB — AFP TUMOR MARKER: AFP, Serum, Tumor Marker: 3.6 ng/mL (ref 0.0–8.3)

## 2019-03-24 ENCOUNTER — Telehealth: Payer: Self-pay | Admitting: *Deleted

## 2019-03-24 NOTE — Telephone Encounter (Signed)
Received call from endo, pt needs to r/s procedure. She did not have transportation.  Called pt and procedure r/s'd to 5/28 at 10:30am. Aware will mail new instructions.

## 2019-03-24 NOTE — Telephone Encounter (Signed)
Kim in endo called and said pt will get a pre-op phone call on 5/26.

## 2019-04-19 ENCOUNTER — Telehealth: Payer: Self-pay | Admitting: Internal Medicine

## 2019-04-19 NOTE — Telephone Encounter (Signed)
LMTCB

## 2019-04-19 NOTE — Telephone Encounter (Signed)
PATIENT CALLED TO RESCHEDULE HER ENDO

## 2019-04-20 NOTE — Telephone Encounter (Signed)
Spoke with patient. She stated she was calling to see when her hip replacement was scheduled for. I advised her she had the wrong office. We have her scheduled for EGD on 5/28. She did not want to r/s this appt.

## 2019-05-17 ENCOUNTER — Encounter (HOSPITAL_COMMUNITY)
Admission: RE | Admit: 2019-05-17 | Discharge: 2019-05-17 | Disposition: A | Discharge status: Home / Self Care | Payer: Medicare Other | Source: Ambulatory Visit | Attending: Internal Medicine | Admitting: Internal Medicine

## 2019-05-17 ENCOUNTER — Other Ambulatory Visit: Payer: Self-pay

## 2019-05-18 ENCOUNTER — Other Ambulatory Visit (HOSPITAL_COMMUNITY)
Admission: RE | Admit: 2019-05-18 | Discharge: 2019-05-18 | Disposition: A | Discharge status: Home / Self Care | Payer: Medicare Other | Source: Ambulatory Visit | Attending: Internal Medicine | Admitting: Internal Medicine

## 2019-05-18 ENCOUNTER — Other Ambulatory Visit: Payer: Self-pay

## 2019-05-18 DIAGNOSIS — Z1159 Encounter for screening for other viral diseases: Secondary | ICD-10-CM | POA: Insufficient documentation

## 2019-05-18 LAB — SARS CORONAVIRUS 2 BY RT PCR (HOSPITAL ORDER, PERFORMED IN ~~LOC~~ HOSPITAL LAB): SARS Coronavirus 2: NEGATIVE

## 2019-05-19 ENCOUNTER — Encounter (HOSPITAL_COMMUNITY)
Admission: RE | Disposition: A | Discharge status: Home / Self Care | Payer: Self-pay | Source: Home / Self Care | Attending: Internal Medicine

## 2019-05-19 ENCOUNTER — Ambulatory Visit (HOSPITAL_COMMUNITY)
Admission: RE | Admit: 2019-05-19 | Discharge: 2019-05-19 | Disposition: A | Discharge status: Home / Self Care | Payer: Medicare Other | Attending: Internal Medicine | Admitting: Internal Medicine

## 2019-05-19 ENCOUNTER — Ambulatory Visit (HOSPITAL_COMMUNITY): Payer: Medicare Other | Admitting: Anesthesiology

## 2019-05-19 ENCOUNTER — Encounter (HOSPITAL_COMMUNITY): Payer: Self-pay | Admitting: *Deleted

## 2019-05-19 ENCOUNTER — Other Ambulatory Visit: Payer: Self-pay

## 2019-05-19 DIAGNOSIS — D696 Thrombocytopenia, unspecified: Secondary | ICD-10-CM | POA: Insufficient documentation

## 2019-05-19 DIAGNOSIS — F419 Anxiety disorder, unspecified: Secondary | ICD-10-CM | POA: Insufficient documentation

## 2019-05-19 DIAGNOSIS — Z7984 Long term (current) use of oral hypoglycemic drugs: Secondary | ICD-10-CM | POA: Diagnosis not present

## 2019-05-19 DIAGNOSIS — B182 Chronic viral hepatitis C: Secondary | ICD-10-CM

## 2019-05-19 DIAGNOSIS — Z791 Long term (current) use of non-steroidal anti-inflammatories (NSAID): Secondary | ICD-10-CM | POA: Insufficient documentation

## 2019-05-19 DIAGNOSIS — F17210 Nicotine dependence, cigarettes, uncomplicated: Secondary | ICD-10-CM | POA: Diagnosis not present

## 2019-05-19 DIAGNOSIS — F329 Major depressive disorder, single episode, unspecified: Secondary | ICD-10-CM | POA: Insufficient documentation

## 2019-05-19 DIAGNOSIS — K449 Diaphragmatic hernia without obstruction or gangrene: Secondary | ICD-10-CM | POA: Diagnosis not present

## 2019-05-19 DIAGNOSIS — K3189 Other diseases of stomach and duodenum: Secondary | ICD-10-CM | POA: Diagnosis not present

## 2019-05-19 DIAGNOSIS — B1920 Unspecified viral hepatitis C without hepatic coma: Secondary | ICD-10-CM | POA: Diagnosis not present

## 2019-05-19 DIAGNOSIS — K2950 Unspecified chronic gastritis without bleeding: Secondary | ICD-10-CM | POA: Insufficient documentation

## 2019-05-19 DIAGNOSIS — N189 Chronic kidney disease, unspecified: Secondary | ICD-10-CM | POA: Diagnosis not present

## 2019-05-19 DIAGNOSIS — E1122 Type 2 diabetes mellitus with diabetic chronic kidney disease: Secondary | ICD-10-CM | POA: Insufficient documentation

## 2019-05-19 DIAGNOSIS — Z79899 Other long term (current) drug therapy: Secondary | ICD-10-CM | POA: Diagnosis not present

## 2019-05-19 DIAGNOSIS — I129 Hypertensive chronic kidney disease with stage 1 through stage 4 chronic kidney disease, or unspecified chronic kidney disease: Secondary | ICD-10-CM | POA: Diagnosis not present

## 2019-05-19 DIAGNOSIS — K210 Gastro-esophageal reflux disease with esophagitis: Secondary | ICD-10-CM | POA: Insufficient documentation

## 2019-05-19 DIAGNOSIS — I8500 Esophageal varices without bleeding: Secondary | ICD-10-CM | POA: Diagnosis not present

## 2019-05-19 DIAGNOSIS — K209 Esophagitis, unspecified: Secondary | ICD-10-CM | POA: Diagnosis not present

## 2019-05-19 DIAGNOSIS — K259 Gastric ulcer, unspecified as acute or chronic, without hemorrhage or perforation: Secondary | ICD-10-CM | POA: Diagnosis not present

## 2019-05-19 DIAGNOSIS — I8510 Secondary esophageal varices without bleeding: Secondary | ICD-10-CM | POA: Diagnosis not present

## 2019-05-19 DIAGNOSIS — K269 Duodenal ulcer, unspecified as acute or chronic, without hemorrhage or perforation: Secondary | ICD-10-CM | POA: Insufficient documentation

## 2019-05-19 DIAGNOSIS — Z87442 Personal history of urinary calculi: Secondary | ICD-10-CM | POA: Diagnosis not present

## 2019-05-19 DIAGNOSIS — K7460 Unspecified cirrhosis of liver: Secondary | ICD-10-CM | POA: Insufficient documentation

## 2019-05-19 DIAGNOSIS — K766 Portal hypertension: Secondary | ICD-10-CM | POA: Diagnosis not present

## 2019-05-19 DIAGNOSIS — M5136 Other intervertebral disc degeneration, lumbar region: Secondary | ICD-10-CM | POA: Diagnosis not present

## 2019-05-19 HISTORY — PX: ESOPHAGOGASTRODUODENOSCOPY (EGD) WITH PROPOFOL: SHX5813

## 2019-05-19 HISTORY — PX: BIOPSY: SHX5522

## 2019-05-19 LAB — GLUCOSE, CAPILLARY
Glucose-Capillary: 105 mg/dL — ABNORMAL HIGH (ref 70–99)
Glucose-Capillary: 104 mg/dL — ABNORMAL HIGH (ref 70–99)

## 2019-05-19 SURGERY — ESOPHAGOGASTRODUODENOSCOPY (EGD) WITH PROPOFOL
Anesthesia: General

## 2019-05-19 MED ORDER — PROPOFOL 10 MG/ML IV BOLUS
INTRAVENOUS | Status: AC
  Filled 2019-05-19: qty 20

## 2019-05-19 MED ORDER — HYDROMORPHONE HCL 1 MG/ML IJ SOLN: 0.2500 mg | INTRAMUSCULAR | Status: DC | PRN

## 2019-05-19 MED ORDER — PROPOFOL 500 MG/50ML IV EMUL
INTRAVENOUS | Status: DC | PRN
  Administered 2019-05-19: 125 ug/kg/min via INTRAVENOUS

## 2019-05-19 MED ORDER — PROPOFOL 10 MG/ML IV BOLUS
INTRAVENOUS | Status: DC | PRN
  Administered 2019-05-19 (×2): 15 mg via INTRAVENOUS

## 2019-05-19 MED ORDER — KETAMINE HCL 10 MG/ML IJ SOLN
INTRAMUSCULAR | Status: DC | PRN
  Administered 2019-05-19: 5 mg via INTRAVENOUS
  Administered 2019-05-19 (×2): 10 mg via INTRAVENOUS

## 2019-05-19 MED ORDER — MIDAZOLAM HCL 2 MG/2ML IJ SOLN: 0.5000 mg | Freq: Once | INTRAMUSCULAR | Status: DC | PRN

## 2019-05-19 MED ORDER — CHLORHEXIDINE GLUCONATE CLOTH 2 % EX PADS: 6.0000 | MEDICATED_PAD | Freq: Once | CUTANEOUS | Status: DC

## 2019-05-19 MED ORDER — LACTATED RINGERS IV SOLN
INTRAVENOUS | Status: DC
  Administered 2019-05-19: 1000 mL via INTRAVENOUS

## 2019-05-19 MED ORDER — HYDROCODONE-ACETAMINOPHEN 7.5-325 MG PO TABS: 1.0000 | ORAL_TABLET | Freq: Once | ORAL | Status: DC | PRN

## 2019-05-19 MED ORDER — PROMETHAZINE HCL 25 MG/ML IJ SOLN: 6.2500 mg | INTRAMUSCULAR | Status: DC | PRN

## 2019-05-19 MED ORDER — KETAMINE HCL 50 MG/5ML IJ SOSY
PREFILLED_SYRINGE | INTRAMUSCULAR | Status: AC
  Filled 2019-05-19: qty 5

## 2019-05-19 NOTE — Transfer of Care (Signed)
Immediate Anesthesia Transfer of Care Note  Patient: Gabriella Leon  Procedure(s) Performed: ESOPHAGOGASTRODUODENOSCOPY (EGD) WITH PROPOFOL (N/A ) BIOPSY  Patient Location: PACU  Anesthesia Type:General  Level of Consciousness: awake and patient cooperative  Airway & Oxygen Therapy: Patient Spontanous Breathing and Patient connected to nasal cannula oxygen  Post-op Assessment: Report given to RN, Post -op Vital signs reviewed and stable and Patient moving all extremities  Post vital signs: Reviewed and stable  Last Vitals:  Vitals Value Taken Time  BP    Temp    Pulse 94 05/19/2019  9:26 AM  Resp    SpO2 100 % 05/19/2019  9:26 AM  Vitals shown include unvalidated device data.  Last Pain:  Vitals:   05/19/19 0904  TempSrc:   PainSc: 6       Patients Stated Pain Goal: 8 (09/28/11 1975)  Complications: No apparent anesthesia complications

## 2019-05-19 NOTE — Discharge Instructions (Signed)
EGD Discharge instructions Please read the instructions outlined below and refer to this sheet in the next few weeks. These discharge instructions provide you with general information on caring for yourself after you leave the hospital. Your doctor may also give you specific instructions. While your treatment has been planned according to the most current medical practices available, unavoidable complications occasionally occur. If you have any problems or questions after discharge, please call your doctor. ACTIVITY  You may resume your regular activity but move at a slower pace for the next 24 hours.   Take frequent rest periods for the next 24 hours.   Walking will help expel (get rid of) the air and reduce the bloated feeling in your abdomen.   No driving for 24 hours (because of the anesthesia (medicine) used during the test).   You may shower.   Do not sign any important legal documents or operate any machinery for 24 hours (because of the anesthesia used during the test).  NUTRITION  Drink plenty of fluids.   You may resume your normal diet.   Begin with a light meal and progress to your normal diet.   Avoid alcoholic beverages for 24 hours or as instructed by your caregiver.  MEDICATIONS  You may resume your normal medications unless your caregiver tells you otherwise.  WHAT YOU CAN EXPECT TODAY  You may experience abdominal discomfort such as a feeling of fullness or gas pains.  FOLLOW-UP  Your doctor will discuss the results of your test with you.  SEEK IMMEDIATE MEDICAL ATTENTION IF ANY OF THE FOLLOWING OCCUR:  Excessive nausea (feeling sick to your stomach) and/or vomiting.   Severe abdominal pain and distention (swelling).   Trouble swallowing.   Temperature over 101 F (37.8 C).   Rectal bleeding or vomiting of blood.    Stop meloxicam and Advil.  Use non-aspirin/non-NSAID medications for pain  Stop omeprazole and ranitidine  Stop  propranolol  Begin Nadolol 40 mg daily  Begin Protonix 40 mg twice daily  Office visit with [place] in 4 weeks  Will need a repeat EGD to check on ulcer healing in about 12 weeks

## 2019-05-19 NOTE — Anesthesia Postprocedure Evaluation (Signed)
Anesthesia Post Note  Patient: Gabriella Leon  Procedure(s) Performed: ESOPHAGOGASTRODUODENOSCOPY (EGD) WITH PROPOFOL (N/A ) BIOPSY  Patient location during evaluation: PACU Anesthesia Type: General Level of consciousness: awake and alert and patient cooperative Pain management: pain level controlled Vital Signs Assessment: post-procedure vital signs reviewed and stable Respiratory status: spontaneous breathing, nonlabored ventilation and respiratory function stable Cardiovascular status: blood pressure returned to baseline Postop Assessment: no apparent nausea or vomiting Anesthetic complications: no     Last Vitals:  Vitals:   05/19/19 0948 05/19/19 0952  BP:  (!) 176/78  Pulse: 82   Resp: 20   Temp: 36.7 C   SpO2: 99%     Last Pain:  Vitals:   05/19/19 0948  TempSrc: Oral  PainSc: 0-No pain                 Blayden Conwell J

## 2019-05-19 NOTE — Op Note (Signed)
[facility] Patient Name: Gabriella Leon Procedure Date: 05/19/2019 8:31 AM MRN: [id number] Date of Birth: September 13, 1956 Attending MD: Norvel Richards , MD CSN: [id number] Age: 63 Admit Type: Outpatient Procedure:                Upper GI endoscopy Indications:              Surveillance procedure Providers:                Norvel Richards, MD, Otis Peak B. Sharon Seller, RN,                            Raphael Gibney, Technician, Randa Spike,                            Technician Referring MD:             Stoney Bang MD, MD Medicines:                Propofol per Anesthesia Complications:            No immediate complications. Estimated Blood Loss:     Estimated blood loss was minimal. Procedure:                Pre-Anesthesia Assessment:                           - Prior to the procedure, a History and Physical                            was performed, and patient medications and                            allergies were reviewed. The patient's tolerance of                            previous anesthesia was also reviewed. The risks                            and benefits of the procedure and the sedation                            options and risks were discussed with the patient.                            All questions were answered, and informed consent                            was obtained. Prior Anticoagulants: The patient has                            taken no previous anticoagulant or antiplatelet                            agents. ASA Grade Assessment: III - A patient with  severe systemic disease. After reviewing the risks                            and benefits, the patient was deemed in                            satisfactory condition to undergo the procedure.                           After obtaining informed consent, the endoscope was                            passed under direct vision. Throughout the   procedure, the patient's blood pressure, pulse, and                            oxygen saturations were monitored continuously. The                            [id number]-H190 ([id number]) scope was introduced through the                            and advanced to the second part of duodenum. The                            upper GI endoscopy was accomplished without                            difficulty. The patient tolerated the procedure                            well. Scope In: 9:13:25 AM Scope Out: 9:19:37 AM Total Procedure Duration: 0 hours 6 minutes 12 seconds  Findings:      Patient with 4 columns of grade 1-2 esophageal varices without bleeding       stigmata. Patulous EG junction. Multiple erosions straddling the GE       junction -largest approximately 5 x 5 mm.. No Barrett's epithelium seen.       Small hiatal hernia. Portal gastropathy present. Antral erosions with an       elliptical clean-based duodenal ulcer extending from the pyloric channel       into the duodenal bulb. No gastric varices seen. No infiltrating process       seen. Second portion of the duodenum appeared normal. Biopsies of       gastric mucosa taken. Impression:               -Erosive reflux esophagitis. Grade 1 2 esophageal                            varices                           -Patulous EG junction. Hiatal hernia. Portal                            gastropathy. Gastric and duodenal ulcer  disease no                            specimens collected. That is post gastric biopsy. Moderate Sedation:      Moderate (conscious) sedation was personally administered by an       anesthesia professional. The following parameters were monitored: oxygen       saturation, heart rate, blood pressure, respiratory rate, EKG, adequacy       of pulmonary ventilation, and response to care. Recommendation:           - Patient has a contact number available for                            emergencies. The signs and symptoms of  potential                            delayed complications were discussed with the                            patient. Return to normal activities tomorrow.                            Written discharge instructions were provided to the                            patient.                           - Advance diet as tolerated. Stop propranolol stop                            meloxicam and Advil. Stop ranitidine and                            omeprazole. Begin Protonix 40 mg twice daily. Begin                            nadolol 40 mg once daily avoid all nonsteroidal                            agents. Will need office visit in 4 weeks. Repeat                            EGD to verify ulcer healing in about 12 weeks.                            Follow-up on pathology. Procedure Code(s):        --- Professional ---                           [phone], Esophagogastroduodenoscopy, flexible,                            transoral; diagnostic, including collection of  specimen(s) by brushing or washing, when performed                            (separate procedure) Diagnosis Code(s):        --- Professional ---                           K20.9, Esophagitis, unspecified CPT copyright 2019 American Medical Association. All rights reserved. The codes documented in this report are preliminary and upon coder review may  be revised to meet current compliance requirements. Cristopher Estimable. Jasneet Schobert, MD Norvel Richards, MD 05/19/2019 9:34:30 AM This report has been signed electronically. Number of Addenda: 0

## 2019-05-19 NOTE — H&P (Signed)
@LOGO @   Primary Care Physician:  Neale Burly, MD Primary Gastroenterologist:  Dr. [name]  Pre-Procedure History & Physical: HPI:  Gabriella Leon is a 63 y.o. female here for surveillance of known esophageal varices.  No beta-blockade for an extended period of time.  Previously on propranolol.  Now back on propranolol.  Has not been compliant with propranolol.  States it makes her "swell".  Apparently commonly taking 10 mg daily.  Resting heart rate 71 today.  Agree would need to reassess varices and as it has been sometime since she has been assessed no history of variceal hemorrhage.  Past Medical History:  Diagnosis Date  . Acid reflux disease with ulcer   . Anxiety   . Arthritis   . Blood dyscrasia    thrombocytopenia-next tx injection 09/25/15  . Blood transfusion   . Chronic kidney disease   . Cirrhosis ([place])   . DDD (degenerative disc disease), lumbar   . Depression   . Diabetes mellitus   . Esophageal varices ([place]) 09/18/2014  . Folate deficiency 03/28/2014   Noted on 03/23/2014.  Folate 1 mg prescribed.  . Gallstones   . GERD (gastroesophageal reflux disease)   . Headache    migraines  . Helicobacter pylori ab+    July 2010, s/p treatment  . Hepatitis B antibody positive   . Hepatitis C    and B positive antibody  . History of alcoholism ([place])   . History of kidney stones   . Hypertension   . Left leg pain   . Liver cirrhosis ([place])   . Low back pain   . Other pancytopenia ([place]) 10/27/2013  . Ulcer    ?    Past Surgical History:  Procedure Laterality Date  . ANTERIOR CERVICAL DECOMP/DISCECTOMY FUSION N/A 09/28/2015   Procedure: Cervical three-four Anterior cervical decompression/diskectomy/fusion;  Surgeon: Leeroy Cha, MD;  Location: [place] NEURO ORS;  Service: Neurosurgery;  Laterality: N/A;  C3-4 Anterior cervical decompression/diskectomy/fusion  . APPENDECTOMY     age 67  . BIOPSY  12/18/2011      . [place]  greater than 10 yrs   [place]  . COLONOSCOPY  12/18/11   minimal anal canal hemorrhoids, friable rectal and colonic mucosa, left-sided diverticulosis, repeat in 2022.   . ESOPHAGOGASTRODUODENOSCOPY  12/18/11   3 columns of Grade II esophageal varices, reflux esohpagitis, portal gastropathy, path with chronic gastritis, negative H.pylori, surveillance in June 2014  . ESOPHAGOGASTRODUODENOSCOPY (EGD) WITH PROPOFOL N/A 04/13/2014   Dr. [name]: grade 1-2 varices, hiatal hernia, portal gastropathy. NO need for further surveillance while on prophylaxis unless evidence of bleeding  . MULTIPLE EXTRACTIONS WITH ALVEOLOPLASTY  03/08/2012   Procedure: MULTIPLE EXTRACION WITH ALVEOLOPLASTY;  Surgeon: Gae Bon, DDS;  Location: [place];  Service: Oral Surgery;  Laterality: N/A;    Prior to Admission medications   Medication Sig Start Date End Date Taking? Authorizing Provider  folic acid (FOLVITE) 1 MG tablet Take 1 tablet (1 mg total) by mouth daily. 03/28/14  Yes Kefalas, Manon Hilding, PA-C  furosemide (LASIX) 40 MG tablet Take 40 mg by mouth daily.    Yes [provider]  glimepiride (AMARYL) 2 MG tablet Take 2 mg by mouth daily as needed (if blood sugar is >160).    Yes [provider]  HYDROcodone-acetaminophen (NORCO/VICODIN) 5-325 MG per tablet Take 1 tablet by mouth every 6 (six) hours as needed for moderate pain.   Yes [provider]  ibuprofen (ADVIL,MOTRIN) 200 MG tablet Take 400 mg by mouth every 6 (six) hours as needed for headache or moderate pain.   Yes [provider]  lisinopril-hydrochlorothiazide (PRINZIDE,ZESTORETIC) 10-12.5 MG per tablet Take 1 tablet by mouth daily.   Yes [provider]  meloxicam (MOBIC) 15 MG tablet Take 1 tablet (15 mg total) by mouth daily. 12/06/18  Yes Marcial Pacas, MD  omeprazole (PRILOSEC) 20 MG capsule TAKE 1 CAPSULE BY MOUTH TWICE DAILY BEFORE A MEAL Patient taking differently: Take 20 mg by mouth 2 (two)  times daily.  10/12/17  Yes Mahala Menghini, PA-C  phytonadione (VITAMIN K) 5 MG tablet Take 1 tablet (5 mg total) by mouth daily. Patient taking differently: Take 5 mg by mouth daily at 12 noon.  09/11/15  Yes Susanne Borders, NP  propranolol (INDERAL) 20 MG tablet Take 1 tablet (20 mg total) by mouth 2 (two) times daily. 01/31/19  Yes Carlis Stable, NP  ranitidine (ZANTAC) 300 MG capsule Take 300 mg by mouth daily as needed for heartburn.    Yes [provider]    Allergies as of 01/31/2019  . (No Known Allergies)    Family History  Problem Relation Age of Onset  . Diabetes Mother   . Cancer Paternal Uncle        passed away age 24  . Other Father        died in [place] in 68  . Anesthesia problems Neg Hx   . Hypotension Neg Hx   . Malignant hyperthermia Neg Hx   . Pseudochol deficiency Neg Hx   . Colon cancer Neg Hx   . Liver disease Neg Hx     Social History   Socioeconomic History  . Marital status: Legally Separated    Spouse name: Not on file  . Number of children: 1  . Years of education: 10th grade  . Highest education level: Not on file  Occupational History  . Occupation: disabled    Fish farm manager: NOT EMPLOYED  Social Needs  . Financial resource strain: Not on file  . Food insecurity:    Worry: Not on file    Inability: Not on file  . Transportation needs:    Medical: Not on file    Non-medical: Not on file  Tobacco Use  . Smoking status: Current Every Day Smoker    Packs/day: 0.50    Years: 20.00    Pack years: 10.00    Types: Cigarettes  . Smokeless tobacco: Never Used  Substance and Sexual Activity  . Alcohol use: No    Comment: last use 2015  . Drug use: No  . Sexual activity: Yes    Birth control/protection: Post-menopausal  Lifestyle  . Physical activity:    Days per week: Not on file    Minutes per session: Not on file  . Stress: Not on file  Relationships  . Social connections:    Talks on phone: Not on file    Gets together: Not  on file    Attends religious service: Not on file    Active member of club or organization: Not on file    Attends meetings of clubs or organizations: Not on file    Relationship status: Not on file  . Intimate partner violence:    Fear of current or ex partner: Not on file    Emotionally abused: Not on file    Physically abused: Not on file    Forced sexual activity:  Not on file  Other Topics Concern  . Not on file  Social History Narrative   Lives at home alone.   Right-handed.   Occasional caffeine use.    Review of Systems: See HPI, otherwise negative ROS  Physical Exam: BP (!) 177/78   Temp 98.2 F (36.8 C) (Oral)   Resp 18   Ht 5\' 4"  (1.626 m)   Wt 77.1 kg   SpO2 97%   BMI 29.18 kg/m  General:   Alert,  Well-developed, well-nourished, pleasant and cooperative in NAD Neck:  Supple; no masses or thyromegaly. No significant cervical adenopathy. Lungs:  Clear throughout to auscultation.   No wheezes, crackles, or rhonchi. No acute distress. Heart:  Regular rate and rhythm; no murmurs, clicks, rubs,  or gallops. Abdomen: Non-distended, normal bowel sounds.  Soft and nontender without appreciable mass or hepatosplenomegaly.  Pulses:  Normal pulses noted. Extremities:  Without clubbing or edema.  Impression/Plan: 63 year old lady with history of HCV related cirrhosis.  Thrombocytopenia.  Esophageal varices.  Beta-blockade not at target.  Patient not taking propranolol recommended.  There is a need to assess current state of her varices at this time.  Potential for band ligation reviewed with the patient.  No history of variceal hemorrhage. I suspect we will need to redouble our efforts to bite her with nonselective beta-blockade to get heart rate to target. The risks, benefits, limitations, alternatives and imponderables have been reviewed with the patient. Potential for esophageal dilation, biopsy, etc. have also been reviewed.  Questions have been answered. All parties  agreeable.  May or may not perform banding today per plan-as discussed with patient.  Discussed with anesthesia.  Further recommendations to follow.     Notice: This dictation was prepared with Dragon dictation along with smaller phrase technology. Any transcriptional errors that result from this process are unintentional and may not be corrected upon review.

## 2019-05-19 NOTE — Anesthesia Preprocedure Evaluation (Signed)
Anesthesia Evaluation  Patient identified by MRN, date of birth, ID band Patient awake    Reviewed: Allergy & Precautions, NPO status , Patient's Chart, lab work & pertinent test results  Airway Mallampati: III  TM Distance: >3 FB Neck ROM: Full    Dental no notable dental hx. (+) Edentulous Upper, Poor Dentition, Missing   Pulmonary neg pulmonary ROS, Current Smoker,  Reports intermittant SOB at rest -but c/w smoking    Pulmonary exam normal breath sounds clear to auscultation       Cardiovascular Exercise Tolerance: Poor hypertension, Pt. on medications negative cardio ROS Normal cardiovascular examII Rhythm:Regular Rate:Normal  Reports limited by hip pain    Neuro/Psych  Headaches, Anxiety Depression  Neuromuscular disease negative psych ROS   GI/Hepatic PUD, GERD  Medicated and Controlled,(+) Cirrhosis   Esophageal Varices    , Hepatitis -, CHere for EGD Reports Hep C Tx'd ~ 2015 States stopped Etoh ~2019   Endo/Other  negative endocrine ROSdiabetes  Renal/GU negative Renal ROS  negative genitourinary   Musculoskeletal  (+) Arthritis , Osteoarthritis,    Abdominal   Peds negative pediatric ROS (+)  Hematology negative hematology ROS (+) anemia , Pancytopenia  WBCs <3 , Dr. [name] aware    Anesthesia Other Findings   Reproductive/Obstetrics negative OB ROS                             Anesthesia Physical Anesthesia Plan  ASA: IV  Anesthesia Plan: General   Post-op Pain Management:    Induction: Intravenous  PONV Risk Score and Plan:   Airway Management Planned: Nasal Cannula and Simple Face Mask  Additional Equipment:   Intra-op Plan:   Post-operative Plan:   Informed Consent: I have reviewed the patients History and Physical, chart, labs and discussed the procedure including the risks, benefits and alternatives for the proposed anesthesia with the patient or  authorized representative who has indicated his/her understanding and acceptance.     Dental advisory given  Plan Discussed with: CRNA  Anesthesia Plan Comments: (Plan full PPE use  Plan GA with GETA back up as needed -WTP same after Q&A)        Anesthesia Quick Evaluation

## 2019-05-20 ENCOUNTER — Encounter: Payer: Self-pay | Admitting: Internal Medicine

## 2019-05-23 ENCOUNTER — Encounter (HOSPITAL_COMMUNITY): Payer: Self-pay | Admitting: Internal Medicine

## 2019-05-23 ENCOUNTER — Telehealth: Payer: Self-pay

## 2019-05-23 NOTE — Telephone Encounter (Signed)
Per RMR-  Send letter to patient.  Send copy of letter with path to referring provider and PCP.   Brief ov 4 weeks to check pulse, etc; repeat EGD in 12 weeks

## 2019-05-24 NOTE — Telephone Encounter (Signed)
OV made °

## 2019-06-20 ENCOUNTER — Ambulatory Visit: Payer: Medicare Other | Admitting: Gastroenterology

## 2019-06-20 ENCOUNTER — Encounter: Payer: Self-pay | Admitting: Gastroenterology

## 2019-06-20 ENCOUNTER — Other Ambulatory Visit: Payer: Self-pay

## 2019-06-20 NOTE — Progress Notes (Signed)
Patient was scheduled for telephone visit today. As she has a history of cirrhosis and needed in-person visit, I discussed with her cancelling this appt today and coming in TOMORROW, 6/30, at 11am. It is vital that we assess her due to her reports of edema, fatigue, and we need to assess resting heart rate as she has had beta-blocker therapy adjusted. She will ask her niece to bring her.   EGD May 2020 with erosive reflux esophagitis, Grade 1-2 esophageal varices with patulous EG junction. Hiatal hernia. Portal gastropathy. Gastric and duodenal dulcer disease.  Started on Nadolol 40 mg once daily. Avoid NSAIDs. Needs surveillance in August.   Will need labs ordered at time of visit.

## 2019-06-21 ENCOUNTER — Encounter: Payer: Self-pay | Admitting: Gastroenterology

## 2019-06-21 ENCOUNTER — Encounter: Payer: Self-pay | Admitting: *Deleted

## 2019-06-21 ENCOUNTER — Other Ambulatory Visit: Payer: Self-pay

## 2019-06-21 ENCOUNTER — Ambulatory Visit (INDEPENDENT_AMBULATORY_CARE_PROVIDER_SITE_OTHER): Payer: Medicare Other | Admitting: Gastroenterology

## 2019-06-21 ENCOUNTER — Other Ambulatory Visit: Payer: Self-pay | Admitting: *Deleted

## 2019-06-21 VITALS — BP 162/85 | HR 80 | Temp 97.7°F | Ht 64.0 in | Wt 173.4 lb

## 2019-06-21 DIAGNOSIS — K279 Peptic ulcer, site unspecified, unspecified as acute or chronic, without hemorrhage or perforation: Secondary | ICD-10-CM | POA: Diagnosis not present

## 2019-06-21 DIAGNOSIS — K7469 Other cirrhosis of liver: Secondary | ICD-10-CM

## 2019-06-21 MED ORDER — SPIRONOLACTONE 25 MG PO TABS: 25.0000 mg | ORAL_TABLET | Freq: Every day | ORAL | 1 refills | Status: DC

## 2019-06-21 MED ORDER — SPIRONOLACTONE 50 MG PO TABS: 50.0000 mg | ORAL_TABLET | Freq: Every day | ORAL | 3 refills | Status: DC

## 2019-06-21 NOTE — Progress Notes (Signed)
Referring Provider: Neale Burly, MD Primary Care Physician:  Neale Burly, MD  Primary GI: Dr. [name]   Chief Complaint  Patient presents with  . Cirrhosis    fatigue, swelling    HPI:   Gabriella Leon is a 63 y.o. female presenting today with a history of Hepatitis C cirrhosis, completing treatment in June 2016. Followed by Hematology in the past due to elevated ferritin, felt to be secondary to known liver disease. Has not seen Hematology in about 3 years. Known SVR. EGD completed in May 2020 with erosive reflux esophagitis, Grade 1-2 esophageal varices with patulous EG junction. Hiatal hernia. Portal gastropathy. Gastric and duodenal dulcer disease. Negative H.pylori. She was started on Nadolol 40 mg once daily. Needs surveillance US abdomen Aug 2020.    Diuretics: lasix 40 mg daily, no aldactone listed. Weight 173 today, previously 167 in Feb 2020.; baseline is in the low 160s. Adds salt to food. Eats fast food about twice a week. Fried foods a few days a week. Intermittent swelling lower extremities that resolves when puts her feet up. Feels bloated.   Weak, even at rest. No overt GI bleeding. Epigastric pain intermittently but always underlying. Taking Mylanta, Zantac, Rolaids. Taking Ibuprofen. Protonix BID.   Pulse 70. Was on Nadolol but did not refill this per pharmacist. Has been off for an unspecified amount of time.   Left hip pain. Needs surgery at some point in the future. Limping. No ETOH.    Past Medical History:  Diagnosis Date  . Acid reflux disease with ulcer   . Anxiety   . Arthritis   . Blood dyscrasia    thrombocytopenia-next tx injection 09/25/15  . Blood transfusion   . Chronic kidney disease   . Cirrhosis ([place])   . DDD (degenerative disc disease), lumbar   . Depression   . Diabetes mellitus   . Esophageal varices ([place]) 09/18/2014  . Folate deficiency 03/28/2014   Noted on 03/23/2014.  Folate 1 mg prescribed.  . Gallstones   . GERD  (gastroesophageal reflux disease)   . Headache    migraines  . Helicobacter pylori ab+    July 2010, s/p treatment  . Hepatitis B antibody positive   . Hepatitis C    and B positive antibody  . History of alcoholism ([place])   . History of kidney stones   . Hypertension   . Left leg pain   . Liver cirrhosis ([place])   . Low back pain   . Other pancytopenia ([place]) 10/27/2013  . Ulcer    ?    Past Surgical History:  Procedure Laterality Date  . ANTERIOR CERVICAL DECOMP/DISCECTOMY FUSION N/A 09/28/2015   Procedure: Cervical three-four Anterior cervical decompression/diskectomy/fusion;  Surgeon: Leeroy Cha, MD;  Location: [place] NEURO ORS;  Service: Neurosurgery;  Laterality: N/A;  C3-4 Anterior cervical decompression/diskectomy/fusion  . APPENDECTOMY     age 54  . BIOPSY  12/18/2011      . BIOPSY  05/19/2019   Procedure: BIOPSY;  Surgeon: Daneil Dolin, MD;  [facility];  Service: Endoscopy;;  gastric  . [place]  greater than 10 yrs   [place]  . COLONOSCOPY  12/18/11   minimal anal canal hemorrhoids, friable rectal and colonic mucosa, left-sided diverticulosis, repeat in 2022.   . ESOPHAGOGASTRODUODENOSCOPY  12/18/11   3 columns of Grade II esophageal varices, reflux esohpagitis, portal gastropathy, path with chronic gastritis, negative H.pylori, surveillance  in June 2014  . ESOPHAGOGASTRODUODENOSCOPY (EGD) WITH PROPOFOL N/A 04/13/2014   Dr. [name]: grade 1-2 varices, hiatal hernia, portal gastropathy. NO need for further surveillance while on prophylaxis unless evidence of bleeding  . ESOPHAGOGASTRODUODENOSCOPY (EGD) WITH PROPOFOL N/A 05/19/2019   erosive reflux esophagitis, Grade 1-2 esophageal varices with patulous EG junction. Hiatal hernia. Portal gastropathy. Gastric and duodenal dulcer disease.   . MULTIPLE EXTRACTIONS WITH ALVEOLOPLASTY  03/08/2012   Procedure: MULTIPLE EXTRACION WITH ALVEOLOPLASTY;  Surgeon: Gae Bon, DDS;   Location: [place];  Service: Oral Surgery;  Laterality: N/A;    Current Outpatient Medications  Medication Sig Dispense Refill  . folic acid (FOLVITE) 1 MG tablet Take 1 tablet (1 mg total) by mouth daily. 30 tablet 11  . furosemide (LASIX) 40 MG tablet Take 40 mg by mouth daily.     [name] glimepiride (AMARYL) 2 MG tablet Take 2 mg by mouth daily as needed (if blood sugar is >160).     [name] HYDROcodone-acetaminophen (NORCO/VICODIN) 5-325 MG per tablet Take 1 tablet by mouth every 6 (six) hours as needed for moderate pain.    [name] lisinopril-hydrochlorothiazide (PRINZIDE,ZESTORETIC) 10-12.5 MG per tablet Take 1 tablet by mouth daily.    . nadolol (CORGARD) 40 MG tablet Take 1 tablet by mouth.    . pantoprazole (PROTONIX) 40 MG tablet Take 1 tablet by mouth 2 (two) times a day.    . spironolactone (ALDACTONE) 25 MG tablet Take 1 tablet (25 mg total) by mouth daily. 60 tablet 1   No current facility-administered medications for this visit.     Allergies as of 06/21/2019  . (No Known Allergies)    Family History  Problem Relation Age of Onset  . Diabetes Mother   . Cancer Paternal Uncle        passed away age 57  . Other Father        died in [place] in 43  . Anesthesia problems Neg Hx   . Hypotension Neg Hx   . Malignant hyperthermia Neg Hx   . Pseudochol deficiency Neg Hx   . Colon cancer Neg Hx   . Liver disease Neg Hx     Social History   Socioeconomic History  . Marital status: Legally Separated    Spouse name: Not on file  . Number of children: 1  . Years of education: 10th grade  . Highest education level: Not on file  Occupational History  . Occupation: disabled    Fish farm manager: NOT EMPLOYED  Social Needs  . Financial resource strain: Not on file  . Food insecurity    Worry: Not on file    Inability: Not on file  . Transportation needs    Medical: Not on file    Non-medical: Not on file  Tobacco Use  . Smoking status: Current Every Day Smoker    Packs/day: 0.50    Years:  20.00    Pack years: 10.00    Types: Cigarettes  . Smokeless tobacco: Never Used  Substance and Sexual Activity  . Alcohol use: No    Comment: last use 2015  . Drug use: No  . Sexual activity: Yes    Birth control/protection: Post-menopausal  Lifestyle  . Physical activity    Days per week: Not on file    Minutes per session: Not on file  . Stress: Not on file  Relationships  . Social Herbalist on phone: Not on file    Gets together: Not on file  Attends religious service: Not on file    Active member of club or organization: Not on file    Attends meetings of clubs or organizations: Not on file    Relationship status: Not on file  Other Topics Concern  . Not on file  Social History Narrative   Lives at home alone.   Right-handed.   Occasional caffeine use.    Review of Systems: As mentioned in HPI   Physical Exam: BP (!) 162/85   Pulse 80 (recheck 70 at rest during office visit)  Temp 97.7 F (36.5 C) (Oral)   Ht 5\' 4"  (1.626 m)   Wt 173 lb 6.4 oz (78.7 kg)   BMI 29.76 kg/m  General:   Alert and oriented. No distress noted. Pleasant and cooperative.  Head:  Normocephalic and atraumatic. Eyes:  Conjuctiva clear without scleral icterus. Mouth:  Oral mucosa pink and moist.  Cardiac: S1 S2 present without murmurs  Lungs: clear to auscultation bilaterally Abdomen:  +BS, soft, mild TTP upper abdomen and non-distended. No rebound or guarding. Mild hepatomegaly. No evidence for tense ascites.  Msk:  Symmetrical without gross deformities. Normal posture. Extremities:  With trace pre-tibial edema Neurologic:  Alert and  oriented x4 Psych:  Alert and cooperative. Normal mood and affect.

## 2019-06-21 NOTE — Patient Instructions (Addendum)
Please have blood work done today.   I have sent in spironolactone (Aldactone) to take each morning with furosemide (Lasix). Take 1/2 pill of spironolactone to equal 25 milligrams. These are your fluid pills. It's really important to not add any extra salt to food, avoid fried food and fast food. I have included a handout that will keep your salt intake down. The more salt you eat, the more swelling you have.   We will need to recheck your kidney numbers in 1 week after starting the new medication spironolactone. We might have to bump this down or just leave it.  Please also pick up Nadolol at the pharmacy to take once each day. This will help keep your pulse where it needs to be. IF you notice fatigue after starting this,let me know.   We are arranging an upper endoscopy with Dr. [name] in the near future.  Please avoid all Ibuprofen, Advil, Aleve, Motrin, or aspirin products as this is not good for your liver or your stomach due to history of ulcers.  We will see you back in September!  I enjoyed seeing you again today! As you know, I value our relationship and want to provide genuine, compassionate, and quality care. I welcome your feedback. If you receive a survey regarding your visit,  I greatly appreciate you taking time to fill this out. See you next time!  Annitta Needs, PhD, ANP-BC [facility]   Low-Sodium Eating Plan Sodium, which is an element that makes up salt, helps you maintain a healthy balance of fluids in your body. Too much sodium can increase your blood pressure and cause fluid and waste to be held in your body. Your health care provider or dietitian may recommend following this plan if you have high blood pressure (hypertension), kidney disease, liver disease, or heart failure. Eating less sodium can help lower your blood pressure, reduce swelling, and protect your heart, liver, and kidneys. What are tips for following this plan? General guidelines  Most  people on this plan should limit their sodium intake to 1,500-2,000 mg (milligrams) of sodium each day. Reading food labels   The Nutrition Facts label lists the amount of sodium in one serving of the food. If you eat more than one serving, you must multiply the listed amount of sodium by the number of servings.  Choose foods with less than 140 mg of sodium per serving.  Avoid foods with 300 mg of sodium or more per serving. Shopping  Look for lower-sodium products, often labeled as "low-sodium" or "no salt added."  Always check the sodium content even if foods are labeled as "unsalted" or "no salt added".  Buy fresh foods. ? Avoid canned foods and premade or frozen meals. ? Avoid canned, cured, or processed meats  Buy breads that have less than 80 mg of sodium per slice. Cooking  Eat more home-cooked food and less restaurant, buffet, and fast food.  Avoid adding salt when cooking. Use salt-free seasonings or herbs instead of table salt or sea salt. Check with your health care provider or pharmacist before using salt substitutes.  Cook with plant-based oils, such as canola, sunflower, or olive oil. Meal planning  When eating at a restaurant, ask that your food be prepared with less salt or no salt, if possible.  Avoid foods that contain MSG (monosodium glutamate). MSG is sometimes added to [place] food, bouillon, and some canned foods. What foods are recommended? The items listed may not be a complete list.  Talk with your dietitian about what dietary choices are best for you. Grains Low-sodium cereals, including oats, puffed wheat and rice, and shredded wheat. Low-sodium crackers. Unsalted rice. Unsalted pasta. Low-sodium bread. Whole-grain breads and whole-grain pasta. Vegetables Fresh or frozen vegetables. "No salt added" canned vegetables. "No salt added" tomato sauce and paste. Low-sodium or reduced-sodium tomato and vegetable juice. Fruits Fresh, frozen, or canned fruit.  Fruit juice. Meats and other protein foods Fresh or frozen (no salt added) meat, poultry, seafood, and fish. Low-sodium canned tuna and salmon. Unsalted nuts. Dried peas, beans, and lentils without added salt. Unsalted canned beans. Eggs. Unsalted nut butters. Dairy Milk. Soy milk. Cheese that is naturally low in sodium, such as ricotta cheese, fresh mozzarella, or Swiss cheese Low-sodium or reduced-sodium cheese. Cream cheese. Yogurt. Fats and oils Unsalted butter. Unsalted margarine with no trans fat. Vegetable oils such as canola or olive oils. Seasonings and other foods Fresh and dried herbs and spices. Salt-free seasonings. Low-sodium mustard and ketchup. Sodium-free salad dressing. Sodium-free light mayonnaise. Fresh or refrigerated horseradish. Lemon juice. Vinegar. Homemade, reduced-sodium, or low-sodium soups. Unsalted popcorn and pretzels. Low-salt or salt-free chips. What foods are not recommended? The items listed may not be a complete list. Talk with your dietitian about what dietary choices are best for you. Grains Instant hot cereals. Bread stuffing, pancake, and biscuit mixes. Croutons. Seasoned rice or pasta mixes. Noodle soup cups. Boxed or frozen macaroni and cheese. Regular salted crackers. Self-rising flour. Vegetables Sauerkraut, pickled vegetables, and relishes. Olives. [place] fries. Onion rings. Regular canned vegetables (not low-sodium or reduced-sodium). Regular canned tomato sauce and paste (not low-sodium or reduced-sodium). Regular tomato and vegetable juice (not low-sodium or reduced-sodium). Frozen vegetables in sauces. Meats and other protein foods Meat or fish that is salted, canned, smoked, spiced, or pickled. Bacon, ham, sausage, hotdogs, corned beef, chipped beef, packaged lunch meats, salt pork, jerky, pickled herring, anchovies, regular canned tuna, sardines, salted nuts. Dairy Processed cheese and cheese spreads. Cheese curds. Blue cheese. Feta cheese. String  cheese. Regular cottage cheese. Buttermilk. Canned milk. Fats and oils Salted butter. Regular margarine. Ghee. Bacon fat. Seasonings and other foods Onion salt, garlic salt, seasoned salt, table salt, and sea salt. Canned and packaged gravies. Worcestershire sauce. Tartar sauce. Barbecue sauce. Teriyaki sauce. Soy sauce, including reduced-sodium. Steak sauce. Fish sauce. Oyster sauce. Cocktail sauce. Horseradish that you find on the shelf. Regular ketchup and mustard. Meat flavorings and tenderizers. Bouillon cubes. Hot sauce and Tabasco sauce. Premade or packaged marinades. Premade or packaged taco seasonings. Relishes. Regular salad dressings. Salsa. Potato and tortilla chips. Corn chips and puffs. Salted popcorn and pretzels. Canned or dried soups. Pizza. Frozen entrees and pot pies. Summary  Eating less sodium can help lower your blood pressure, reduce swelling, and protect your heart, liver, and kidneys.  Most people on this plan should limit their sodium intake to 1,500-2,000 mg (milligrams) of sodium each day.  Canned, boxed, and frozen foods are high in sodium. Restaurant foods, fast foods, and pizza are also very high in sodium. You also get sodium by adding salt to food.  Try to cook at home, eat more fresh fruits and vegetables, and eat less fast food, canned, processed, or prepared foods. This information is not intended to replace advice given to you by your health care provider. Make sure you discuss any questions you have with your health care provider. Document Released: 05/30/2002 Document Revised: 11/20/2017 Document Reviewed: 12/01/2016 Elsevier Patient Education  2020 [employer].

## 2019-06-22 ENCOUNTER — Encounter: Payer: Self-pay | Admitting: *Deleted

## 2019-06-22 ENCOUNTER — Other Ambulatory Visit: Payer: Self-pay

## 2019-06-22 ENCOUNTER — Encounter: Payer: Self-pay | Admitting: Gastroenterology

## 2019-06-22 DIAGNOSIS — K7460 Unspecified cirrhosis of liver: Secondary | ICD-10-CM

## 2019-06-22 LAB — HEPATIC FUNCTION PANEL
AG Ratio: 0.9 (calc) — ABNORMAL LOW (ref 1.0–2.5)
ALT: 20 U/L (ref 6–29)
AST: 41 U/L — ABNORMAL HIGH (ref 10–35)
Albumin: 3.5 g/dL — ABNORMAL LOW (ref 3.6–5.1)
Alkaline phosphatase (APISO): 169 U/L — ABNORMAL HIGH (ref 37–153)
Bilirubin, Direct: 0.7 mg/dL — ABNORMAL HIGH (ref 0.0–0.2)
Globulin: 3.7 g/dL (calc) (ref 1.9–3.7)
Indirect Bilirubin: 1.2 mg/dL (calc) (ref 0.2–1.2)
Total Bilirubin: 1.9 mg/dL — ABNORMAL HIGH (ref 0.2–1.2)
Total Protein: 7.2 g/dL (ref 6.1–8.1)

## 2019-06-22 LAB — BASIC METABOLIC PANEL WITH GFR
BUN/Creatinine Ratio: 15 (calc) (ref 6–22)
BUN: 16 mg/dL (ref 7–25)
CO2: 27 mmol/L (ref 20–32)
Calcium: 9.3 mg/dL (ref 8.6–10.4)
Chloride: 104 mmol/L (ref 98–110)
Creat: 1.1 mg/dL — ABNORMAL HIGH (ref 0.50–0.99)
GFR, Est African American: 63 mL/min/{1.73_m2} (ref >=60)
GFR, Est Non African American: 55 mL/min/{1.73_m2} — ABNORMAL LOW (ref >=60)
Glucose, Bld: 112 mg/dL — ABNORMAL HIGH (ref 65–99)
Potassium: 4 mmol/L (ref 3.5–5.3)
Sodium: 138 mmol/L (ref 135–146)

## 2019-06-22 LAB — CBC WITH DIFFERENTIAL/PLATELET
Absolute Monocytes: 158 cells/uL — ABNORMAL LOW (ref 200–950)
Basophils Absolute: 19 cells/uL (ref 0–200)
Basophils Relative: 1.2 %
Eosinophils Absolute: 69 cells/uL (ref 15–500)
Eosinophils Relative: 4.3 %
HCT: 29.4 % — ABNORMAL LOW (ref 35.0–45.0)
Hemoglobin: 10.4 g/dL — ABNORMAL LOW (ref 11.7–15.5)
Lymphs Abs: 544 cells/uL — ABNORMAL LOW (ref 850–3900)
MCH: 35.7 pg — ABNORMAL HIGH (ref 27.0–33.0)
MCHC: 35.4 g/dL (ref 32.0–36.0)
MCV: 101 fL — ABNORMAL HIGH (ref 80.0–100.0)
MPV: 13 fL — ABNORMAL HIGH (ref 7.5–12.5)
Monocytes Relative: 9.9 %
Neutro Abs: 810 cells/uL — ABNORMAL LOW (ref 1500–7800)
Neutrophils Relative %: 50.6 %
Platelets: 47 10*3/uL — ABNORMAL LOW (ref 140–400)
RBC: 2.91 10*6/uL — ABNORMAL LOW (ref 3.80–5.10)
RDW: 12.5 % (ref 11.0–15.0)
Total Lymphocyte: 34 %
WBC: 1.6 10*3/uL — ABNORMAL LOW (ref 3.8–10.8)

## 2019-06-22 LAB — IRON,TIBC AND FERRITIN PANEL
%SAT: 93 % (calc) — ABNORMAL HIGH (ref 16–45)
Ferritin: 836 ng/mL — ABNORMAL HIGH (ref 16–232)
Iron: 326 ug/dL — ABNORMAL HIGH (ref 45–160)
TIBC: 351 mcg/dL (calc) (ref 250–450)

## 2019-06-22 LAB — PROTIME-INR
INR: 1.2 — ABNORMAL HIGH
Prothrombin Time: 11.9 s — ABNORMAL HIGH (ref 9.0–11.5)

## 2019-06-22 NOTE — Assessment & Plan Note (Addendum)
History of Hep C, cirrhosis, s/p treatment with documented SVR. Some fluid retention noted in setting of increased sodium in diet. No evidence for ascites on exam. Continue with lasix 40 mg daily, add aldactone 25 mg daily. May need to titrate this up. Resume Nadolol: resting heart rate today 70-80. Check routine labs including CBC, CMP, INR. [place] Aug 2020 for surveillance. Repeat BMP in 1 week to ensure tolerating change in diuretic therapy.

## 2019-06-22 NOTE — Assessment & Plan Note (Signed)
63 year old female with cirrhosis, presenting with need for surveillance EGD due to PUD, with last EGD in May 2020. Negative H.pylori. She has been using Ibuprofen intermittently and reports persistent epigastric pain. Discussed avoidance of NSAIDs, continue PPI BID, and pursue EGD. Consider imaging depending on labs drawn today.   Proceed with upper endoscopy in the near future with Dr. [name]. The risks, benefits, and alternatives have been discussed in detail with patient. They have stated understanding and desire to proceed.  Propofol due to polypharmacy Avoid NSAIDs Return in September 2020

## 2019-06-22 NOTE — Progress Notes (Signed)
CC'D TO PCP °

## 2019-06-27 NOTE — Progress Notes (Signed)
MELD Na 13. Hgb slightly down from several months ago (usually in 11 range). Ferritin bumped to 836, previously in the 400 range. sats are 93. She has seen hematology in the past. Hemachromatosis mutation negative. Alk phos trending up slightly. Bilirubin 1.9 near baseline. AST slightly elevated. With her abdominal pain, would recommend CT abd/pelvis with contrast. Known history of PUD but need to exclude other etiology as this is persistent. Creatinine 1.10 and GFR is 63, so please have radiology complete with contrast per protocol.   1. CT abd/pelvis with contrast within the week 2. Refer back to Hematology due to elevated ferritin, sats, in setting of cirrhosis. HCV treated already with eradication historically. 3. We may need to draw further serologies to evaluate for any other underlying liver disease after review of CT.

## 2019-06-28 ENCOUNTER — Other Ambulatory Visit: Payer: Self-pay

## 2019-06-28 DIAGNOSIS — R7989 Other specified abnormal findings of blood chemistry: Secondary | ICD-10-CM

## 2019-06-28 DIAGNOSIS — R109 Unspecified abdominal pain: Secondary | ICD-10-CM

## 2019-06-28 NOTE — Progress Notes (Unsigned)
Ct

## 2019-06-28 NOTE — Patient Instructions (Signed)
No PA needed for CT abd/pelvis w/contrast per [facility].

## 2019-07-05 ENCOUNTER — Ambulatory Visit (HOSPITAL_COMMUNITY): Admission: RE | Admit: 2019-07-05 | Payer: Medicare Other | Source: Ambulatory Visit

## 2019-07-06 ENCOUNTER — Other Ambulatory Visit: Payer: Self-pay

## 2019-07-06 ENCOUNTER — Ambulatory Visit (HOSPITAL_COMMUNITY)
Admission: RE | Admit: 2019-07-06 | Discharge: 2019-07-06 | Disposition: A | Discharge status: Home / Self Care | Payer: Medicare Other | Source: Ambulatory Visit | Attending: Gastroenterology | Admitting: Gastroenterology

## 2019-07-06 ENCOUNTER — Encounter (HOSPITAL_COMMUNITY): Payer: Self-pay | Admitting: *Deleted

## 2019-07-06 DIAGNOSIS — R109 Unspecified abdominal pain: Secondary | ICD-10-CM | POA: Diagnosis not present

## 2019-07-06 DIAGNOSIS — K5730 Diverticulosis of large intestine without perforation or abscess without bleeding: Secondary | ICD-10-CM | POA: Diagnosis not present

## 2019-07-06 DIAGNOSIS — R197 Diarrhea, unspecified: Secondary | ICD-10-CM | POA: Diagnosis not present

## 2019-07-06 MED ORDER — IOHEXOL 300 MG/ML  SOLN
100.0000 mL | Freq: Once | INTRAMUSCULAR | Status: AC | PRN
  Administered 2019-07-06: 16:00:00 100 mL via INTRAVENOUS

## 2019-07-07 ENCOUNTER — Encounter (HOSPITAL_COMMUNITY): Payer: Self-pay | Admitting: Hematology

## 2019-07-07 ENCOUNTER — Inpatient Hospital Stay (HOSPITAL_COMMUNITY): Payer: Medicare Other | Attending: Hematology | Admitting: Hematology

## 2019-07-07 ENCOUNTER — Inpatient Hospital Stay (HOSPITAL_COMMUNITY): Payer: Medicare Other

## 2019-07-07 ENCOUNTER — Other Ambulatory Visit: Payer: Self-pay

## 2019-07-07 VITALS — BP 159/68 | HR 80 | Temp 97.3°F | Resp 18 | Wt 169.9 lb

## 2019-07-07 DIAGNOSIS — D61818 Other pancytopenia: Secondary | ICD-10-CM | POA: Diagnosis not present

## 2019-07-07 DIAGNOSIS — R7989 Other specified abnormal findings of blood chemistry: Secondary | ICD-10-CM | POA: Diagnosis not present

## 2019-07-07 DIAGNOSIS — F17210 Nicotine dependence, cigarettes, uncomplicated: Secondary | ICD-10-CM | POA: Diagnosis not present

## 2019-07-07 LAB — FOLATE: Folate: 6.2 ng/mL (ref >5.9)

## 2019-07-07 LAB — VITAMIN B12: Vitamin B-12: 656 pg/mL (ref 180–914)

## 2019-07-07 NOTE — Assessment & Plan Note (Signed)
1.  Elevated ferritin levels: - This patient has a history of hepatitis C and cirrhosis. - Alcohol abuse, quit completely 8 years ago. - Labs on 06/21/2019 shows ferritin of 836 and percent saturation of 93. - Elevated ferritin in the past from 2012, in the range of 400-680. - CT [facility] on 07/06/2019 shows nodular contour of the liver with cirrhosis.  Spleen is normal.  Significant varices present.  No significant ascites.  No adenopathy. - No family history of hemochromatosis or malignancies. - We will check her labs for hemochromatosis mutations.  Hepatitis C liver disease and cirrhosis can also contribute to elevated ferritin levels.  2.  Pancytopenia: -Labs on 06/21/2019 shows white count of 1.6 with ANC of 890.  Platelet count is 47. -CT on 07/06/2019 shows normal spleen.  Does not report any recurrent infections.  Does not report any fevers, night sweats or weight loss. -Differential diagnosis includes hypersplenism.  However other conditions should be ruled out.  Will check D28, folic acid and methylmalonic acid. -She does have occasional blood in the stools.  No hematemesis.  3.  Smoking history: -Current active smoker, smokes half pack per day for the past 36 years.

## 2019-07-07 NOTE — Progress Notes (Signed)
Gabriella Leon, Gabriella Leon   CLINIC:  Medical Oncology/Hematology  PCP:  Neale Burly, MD [place] [phone]   REASON FOR VISIT:  Follow-up for elevated ferritin and pancytopenia.    INTERVAL HISTORY:  Gabriella Leon 63 y.o. female seen for follow-up of elevated ferritin and pancytopenia.  She had elevated ferritin since 2012.  However her ferritin was recently even higher at 836 and percent saturation of 93.  Denies any family history of hemochromatosis.  I could not find any labs tested for hemochromatosis mutation.  Last colonoscopy was in August 2013 with atypical polyp removed from her ascending colon.  Has occasional blood in the stools.  Denies any hematemesis.  No joint pains.  Slight abdominal distention present.  Current active smoker, smokes half pack per day for 36 years.  No significant family history for hemochromatosis or malignancies.  She had a history of hepatitis C resulting in cirrhosis.  She also had heavy alcohol abuse.  She quit drinking alcohol 8 years ago.  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  Denies any fevers, night sweats or weight loss.  Appetite and energy levels are low.  Hip pain on the left side is reported and chronic.    REVIEW OF SYSTEMS:  Review of Systems  Constitutional: Positive for fatigue.  Respiratory: Positive for shortness of breath.   Gastrointestinal: Positive for diarrhea.  Psychiatric/Behavioral: Positive for sleep disturbance.  All other systems reviewed and are negative.    PAST MEDICAL/SURGICAL HISTORY:  Past Medical History:  Diagnosis Date  . Acid reflux disease with ulcer   . Anxiety   . Arthritis   . Blood dyscrasia    thrombocytopenia-next tx injection 09/25/15  . Blood transfusion   . Chronic kidney disease   . Cirrhosis ([place])   . DDD (degenerative disc disease), lumbar   . Depression   . Diabetes mellitus   .  Esophageal varices ([place]) 09/18/2014  . Folate deficiency 03/28/2014   Noted on 03/23/2014.  Folate 1 mg prescribed.  . Gallstones   . GERD (gastroesophageal reflux disease)   . Headache    migraines  . Helicobacter pylori ab+    July 2010, s/p treatment  . Hepatitis B antibody positive   . Hepatitis C    and B positive antibody  . History of alcoholism ([place])   . History of kidney stones   . Hypertension   . Left leg pain   . Liver cirrhosis ([place])   . Low back pain   . Other pancytopenia ([place]) 10/27/2013  . Ulcer    ?   Past Surgical History:  Procedure Laterality Date  . ANTERIOR CERVICAL DECOMP/DISCECTOMY FUSION N/A 09/28/2015   Procedure: Cervical three-four Anterior cervical decompression/diskectomy/fusion;  Surgeon: Leeroy Cha, MD;  Location: [place] [facility];  Service: Neurosurgery;  Laterality: N/A;  C3-4 Anterior cervical decompression/diskectomy/fusion  . APPENDECTOMY     age 45  . BIOPSY  12/18/2011      . BIOPSY  05/19/2019   Procedure: BIOPSY;  Surgeon: Daneil Dolin, MD;  [facility];  Service: Endoscopy;;  gastric  . [place]  greater than 10 yrs   [place]  . COLONOSCOPY  12/18/11   minimal anal canal hemorrhoids, friable rectal and colonic mucosa, left-sided diverticulosis, repeat in 2022.   . ESOPHAGOGASTRODUODENOSCOPY  12/18/11   3 columns  of Grade II esophageal varices, reflux esohpagitis, portal gastropathy, path with chronic gastritis, negative H.pylori, surveillance in June 2014  . ESOPHAGOGASTRODUODENOSCOPY (EGD) WITH PROPOFOL N/A 04/13/2014   Dr. [name]: grade 1-2 varices, hiatal hernia, portal gastropathy. NO need for further surveillance while on prophylaxis unless evidence of bleeding  . ESOPHAGOGASTRODUODENOSCOPY (EGD) WITH PROPOFOL N/A 05/19/2019   erosive reflux esophagitis, Grade 1-2 esophageal varices with patulous EG junction. Hiatal hernia. Portal gastropathy. Gastric and duodenal dulcer disease.   .  MULTIPLE EXTRACTIONS WITH ALVEOLOPLASTY  03/08/2012   Procedure: MULTIPLE EXTRACION WITH ALVEOLOPLASTY;  Surgeon: Gae Bon, DDS;  Location: [place];  Service: Oral Surgery;  Laterality: N/A;     SOCIAL HISTORY:  Social History   Socioeconomic History  . Marital status: Legally Separated    Spouse name: Not on file  . Number of children: 1  . Years of education: 10th grade  . Highest education level: Not on file  Occupational History  . Occupation: disabled    Fish farm manager: NOT EMPLOYED  Social Needs  . Financial resource strain: Not on file  . Food insecurity    Worry: Not on file    Inability: Not on file  . Transportation needs    Medical: Not on file    Non-medical: Not on file  Tobacco Use  . Smoking status: Current Every Day Smoker    Packs/day: 0.50    Years: 20.00    Pack years: 10.00    Types: Cigarettes  . Smokeless tobacco: Never Used  Substance and Sexual Activity  . Alcohol use: No    Comment: last use 2015  . Drug use: No  . Sexual activity: Yes    Birth control/protection: Post-menopausal  Lifestyle  . Physical activity    Days per week: Not on file    Minutes per session: Not on file  . Stress: Not on file  Relationships  . Social Herbalist on phone: Not on file    Gets together: Not on file    Attends religious service: Not on file    Active member of club or organization: Not on file    Attends meetings of clubs or organizations: Not on file    Relationship status: Not on file  . Intimate partner violence    Fear of current or ex partner: Not on file    Emotionally abused: Not on file    Physically abused: Not on file    Forced sexual activity: Not on file  Other Topics Concern  . Not on file  Social History Narrative   Lives at home alone.   Right-handed.   Occasional caffeine use.    FAMILY HISTORY:  Family History  Problem Relation Age of Onset  . Diabetes Mother   . Cancer Paternal Uncle        passed away age 96  .  Other Father        died in [place] in 35  . Anesthesia problems Neg Hx   . Hypotension Neg Hx   . Malignant hyperthermia Neg Hx   . Pseudochol deficiency Neg Hx   . Colon cancer Neg Hx   . Liver disease Neg Hx     CURRENT MEDICATIONS:  Outpatient Encounter Medications as of 07/07/2019  Medication Sig  . folic acid (FOLVITE) 1 MG tablet Take 1 tablet (1 mg total) by mouth daily.  . furosemide (LASIX) 40 MG tablet Take 40 mg by mouth daily.   [name] glimepiride (AMARYL) 2  MG tablet Take 2 mg by mouth daily as needed (if blood sugar is >160).   [name] HYDROcodone-acetaminophen (NORCO/VICODIN) 5-325 MG per tablet Take 1 tablet by mouth every 6 (six) hours as needed for moderate pain.  [name] lisinopril-hydrochlorothiazide (PRINZIDE,ZESTORETIC) 10-12.5 MG per tablet Take 1 tablet by mouth daily.  . nadolol (CORGARD) 40 MG tablet Take 1 tablet by mouth.  . pantoprazole (PROTONIX) 40 MG tablet Take 1 tablet by mouth 2 (two) times a day.  . spironolactone (ALDACTONE) 25 MG tablet Take 1 tablet (25 mg total) by mouth daily.   No facility-administered encounter medications on file as of 07/07/2019.     ALLERGIES:  No Known Allergies   PHYSICAL EXAM:  ECOG Performance status: 1  Vitals:   07/07/19 1351  BP: (!) 159/68  Pulse: 80  Resp: 18  Temp: (!) 97.3 F (36.3 C)  SpO2: 95%   Filed Weights   07/07/19 1351  Weight: 169 lb 14.4 oz (77.1 kg)    Physical Exam Vitals signs reviewed.  Constitutional:      Appearance: Normal appearance.  Cardiovascular:     Rate and Rhythm: Normal rate and regular rhythm.     Heart sounds: Normal heart sounds.  Pulmonary:     Effort: Pulmonary effort is normal.     Breath sounds: Normal breath sounds.  Abdominal:     General: There is no distension.     Palpations: Abdomen is soft. There is no mass.  Musculoskeletal: Normal range of motion.  Skin:    General: Skin is warm.  Neurological:     General: No focal deficit present.     Mental Status: She is  alert and oriented to person, place, and time.  Psychiatric:        Mood and Affect: Mood normal.        Behavior: Behavior normal.      LABORATORY DATA:  I have reviewed the labs as listed.  CBC    Component Value Date/Time   WBC 1.6 (L) 06/21/2019 1223   RBC 2.91 (L) 06/21/2019 1223   HGB 10.4 (L) 06/21/2019 1223   HGB 11.6 04/14/2016 1326   HCT 29.4 (L) 06/21/2019 1223   HCT 34.2 (L) 04/14/2016 1326   PLT 47 (L) 06/21/2019 1223   PLT 49 (L) 04/14/2016 1326   MCV 101.0 (H) 06/21/2019 1223   MCV 102.4 (H) 04/14/2016 1326   MCH 35.7 (H) 06/21/2019 1223   MCHC 35.4 06/21/2019 1223   RDW 12.5 06/21/2019 1223   RDW 13.5 04/14/2016 1326   LYMPHSABS 544 (L) 06/21/2019 1223   LYMPHSABS 0.7 (L) 04/14/2016 1326   MONOABS 0.3 03/17/2019 1040   MONOABS 0.3 04/14/2016 1326   EOSABS 69 06/21/2019 1223   EOSABS 0.1 04/14/2016 1326   BASOSABS 19 06/21/2019 1223   BASOSABS 0.0 04/14/2016 1326   CMP Latest Ref Rng & Units 06/21/2019 03/17/2019 08/06/2017  Glucose 65 - 99 mg/dL 112(H) 119(H) 101(H)  BUN 7 - 25 mg/dL 16 14 14   Creatinine 0.50 - 0.99 mg/dL 1.10(H) 1.01(H) 1.19(H)  Sodium 135 - 146 mmol/L 138 137 140  Potassium 3.5 - 5.3 mmol/L 4.0 3.8 3.8  Chloride 98 - 110 mmol/L 104 103 104  CO2 20 - 32 mmol/L 27 26 25   Calcium 8.6 - 10.4 mg/dL 9.3 9.1 9.4  Total Protein 6.1 - 8.1 g/dL 7.2 7.4 7.1  Total Bilirubin 0.2 - 1.2 mg/dL 1.9(H) 1.8(H) 0.9  Alkaline Phos 38 - 126 U/L - 160(H) 130  AST 10 - 35 U/L 41(H) 43(H) 42(H)  ALT 6 - 29 U/L 20 22 22        DIAGNOSTIC IMAGING:  I have independently reviewed the scans and discussed with the patient.   I have reviewed Venita Lick LPN's note and agree with the documentation.  I personally performed a face-to-face visit, made revisions and my assessment and plan is as follows.    ASSESSMENT & PLAN:   Elevated ferritin 1.  Elevated ferritin levels: - This patient has a history of hepatitis C and cirrhosis. - Alcohol abuse,  quit completely 8 years ago. - Labs on 06/21/2019 shows ferritin of 836 and percent saturation of 93. - Elevated ferritin in the past from 2012, in the range of 400-680. - CT [facility] on 07/06/2019 shows nodular contour of the liver with cirrhosis.  Spleen is normal.  Significant varices present.  No significant ascites.  No adenopathy. - No family history of hemochromatosis or malignancies. - We will check her labs for hemochromatosis mutations.  Hepatitis C liver disease and cirrhosis can also contribute to elevated ferritin levels.  2.  Pancytopenia: -Labs on 06/21/2019 shows white count of 1.6 with ANC of 890.  Platelet count is 47. -CT on 07/06/2019 shows normal spleen.  Does not report any recurrent infections.  Does not report any fevers, night sweats or weight loss. -Differential diagnosis includes hypersplenism.  However other conditions should be ruled out.  Will check W09, folic acid and methylmalonic acid. -She does have occasional blood in the stools.  No hematemesis.  3.  Smoking history: -Current active smoker, smokes half pack per day for the past 36 years.  Total time spent is 40 minutes with more than 50% of the time spent face-to-face discussing lab results, differential diagnosis, further work-up, counseling and coordination of care.    Orders placed this encounter:  Orders Placed This Encounter  Procedures  . Folate  . Vitamin B12  . Copper, serum  . ANA, IFA (with reflex)  . Rheumatoid factor  . Hemochromatosis DNA-PCR(c282y,h63d)      Derek Jack, MD [place] [phone]

## 2019-07-07 NOTE — Progress Notes (Signed)
CT abd/pelvis with contrast reviewed. No significant ascites. Likely renal cysts. No acute findings. Keep plans for EGD.

## 2019-07-07 NOTE — Patient Instructions (Addendum)
[facility] at [facility] Discharge Instructions  You were seen today by Dr. Katragadda. He went over your history, family history and how you've been feeling. He will have blood drawn today. He will see you back in 3 weeks for follow up.   Thank you for choosing [facility] at [facility] to provide your oncology and hematology care.  To afford each patient quality time with our provider, please arrive at least 15 minutes before your scheduled appointment time.   If you have a lab appointment with the Cancer Center please come in thru the  Main Entrance and check in at the main information desk  You need to re-schedule your appointment should you arrive 10 or more minutes late.  We strive to give you quality time with our providers, and arriving late affects you and other patients whose appointments are after yours.  Also, if you no show three or more times for appointments you may be dismissed from the clinic at the providers discretion.     Again, thank you for choosing [facility].  Our hope is that these requests will decrease the amount of time that you wait before being seen by our physicians.       _____________________________________________________________  Should you have questions after your visit to  Cancer Center, please contact our office at [phone] between the hours of 8:00 a.m. and 4:30 p.m.  Voicemails left after 4:00 p.m. will not be returned until the following business day.  For prescription refill requests, have your pharmacy contact our office and allow 72 hours.    Cancer Center Support Programs:   > Cancer Support Group  2nd Tuesday of the month 1pm-2pm, Journey Room    

## 2019-07-08 LAB — ANTINUCLEAR ANTIBODIES, IFA: ANA Ab, IFA: NEGATIVE

## 2019-07-08 LAB — RHEUMATOID FACTOR: Rheumatoid fact SerPl-aCnc: 24.6 IU/mL — ABNORMAL HIGH (ref 0.0–13.9)

## 2019-07-09 LAB — COPPER, SERUM: Copper: 162 ug/dL (ref 72–166)

## 2019-07-17 LAB — HEMOCHROMATOSIS DNA-PCR(C282Y,H63D)

## 2019-07-20 DIAGNOSIS — M545 Low back pain: Secondary | ICD-10-CM | POA: Diagnosis not present

## 2019-07-20 DIAGNOSIS — K279 Peptic ulcer, site unspecified, unspecified as acute or chronic, without hemorrhage or perforation: Secondary | ICD-10-CM | POA: Diagnosis not present

## 2019-07-20 DIAGNOSIS — I10 Essential (primary) hypertension: Secondary | ICD-10-CM | POA: Diagnosis not present

## 2019-07-20 DIAGNOSIS — K210 Gastro-esophageal reflux disease with esophagitis: Secondary | ICD-10-CM | POA: Diagnosis not present

## 2019-07-20 DIAGNOSIS — E119 Type 2 diabetes mellitus without complications: Secondary | ICD-10-CM | POA: Diagnosis not present

## 2019-07-27 NOTE — Patient Instructions (Signed)
Gabriella Leon  07/27/2019     @PREFPERIOPPHARMACY @   Your procedure is scheduled on  08/04/2019  Report to [facility] at  830   A.M.  Call this number if you have problems the morning of surgery:  [phone]   Remember:  Follow the diet and prep instructions given to you by Dr Margette Fast office.                       Take these medicines the morning of surgery with A SIP OF WATER  Hydrocodone(if needed), nadolol, protonix.    Do not wear jewelry, make-up or nail polish.  Do not wear lotions, powders, or perfumes. Please wear deodorant and brush your teeth.  Do not shave 48 hours prior to surgery.  Men may shave face and neck.  Do not bring valuables to the hospital.  [facility] is not responsible for any belongings or valuables.  Contacts, dentures or bridgework may not be worn into surgery.  Leave your suitcase in the car.  After surgery it may be brought to your room.  For patients admitted to the hospital, discharge time will be determined by your treatment team.  Patients discharged the day of surgery will not be allowed to drive home.   Name and phone number of your driver:   family Special instructions:  None  Please read over the following fact sheets that you were given. Anesthesia Post-op Instructions and Care and Recovery After Surgery       Upper Endoscopy, Adult, Care After This sheet gives you information about how to care for yourself after your procedure. Your health care provider may also give you more specific instructions. If you have problems or questions, contact your health care provider. What can I expect after the procedure? After the procedure, it is common to have:  A sore throat.  Mild stomach pain or discomfort.  Bloating.  Nausea. Follow these instructions at home:   Follow instructions from your health care provider about what to eat or drink after your procedure.  Return to your normal activities as told by your  health care provider. Ask your health care provider what activities are safe for you.  Take over-the-counter and prescription medicines only as told by your health care provider.  Do not drive for 24 hours if you were given a sedative during your procedure.  Keep all follow-up visits as told by your health care provider. This is important. Contact a health care provider if you have:  A sore throat that lasts longer than one day.  Trouble swallowing. Get help right away if:  You vomit blood or your vomit looks like coffee grounds.  You have: ? A fever. ? Bloody, black, or tarry stools. ? A severe sore throat or you cannot swallow. ? Difficulty breathing. ? Severe pain in your chest or abdomen. Summary  After the procedure, it is common to have a sore throat, mild stomach discomfort, bloating, and nausea.  Do not drive for 24 hours if you were given a sedative during the procedure.  Follow instructions from your health care provider about what to eat or drink after your procedure.  Return to your normal activities as told by your health care provider. This information is not intended to replace advice given to you by your health care provider. Make sure you discuss any questions you have with your health care provider. Document Released: 06/08/2012 Document Revised: 06/01/2018 Document  Reviewed: 05/10/2018 Elsevier Patient Education  [place] After These instructions provide you with information about caring for yourself after your procedure. Your health care provider may also give you more specific instructions. Your treatment has been planned according to current medical practices, but problems sometimes occur. Call your health care provider if you have any problems or questions after your procedure. What can I expect after the procedure? After your procedure, you may:  Feel sleepy for several hours.  Feel clumsy and have poor  balance for several hours.  Feel forgetful about what happened after the procedure.  Have poor judgment for several hours.  Feel nauseous or vomit.  Have a sore throat if you had a breathing tube during the procedure. Follow these instructions at home: For at least 24 hours after the procedure:      Have a responsible adult stay with you. It is important to have someone help care for you until you are awake and alert.  Rest as needed.  Do not: ? Participate in activities in which you could fall or become injured. ? Drive. ? Use heavy machinery. ? Drink alcohol. ? Take sleeping pills or medicines that cause drowsiness. ? Make important decisions or sign legal documents. ? Take care of children on your own. Eating and drinking  Follow the diet that is recommended by your health care provider.  If you vomit, drink water, juice, or soup when you can drink without vomiting.  Make sure you have little or no nausea before eating solid foods. General instructions  Take over-the-counter and prescription medicines only as told by your health care provider.  If you have sleep apnea, surgery and certain medicines can increase your risk for breathing problems. Follow instructions from your health care provider about wearing your sleep device: ? Anytime you are sleeping, including during daytime naps. ? While taking prescription pain medicines, sleeping medicines, or medicines that make you drowsy.  If you smoke, do not smoke without supervision.  Keep all follow-up visits as told by your health care provider. This is important. Contact a health care provider if:  You keep feeling nauseous or you keep vomiting.  You feel light-headed.  You develop a rash.  You have a fever. Get help right away if:  You have trouble breathing. Summary  For several hours after your procedure, you may feel sleepy and have poor judgment.  Have a responsible adult stay with you for at least  24 hours or until you are awake and alert. This information is not intended to replace advice given to you by your health care provider. Make sure you discuss any questions you have with your health care provider. Document Released: 03/30/2016 Document Revised: 03/08/2018 Document Reviewed: 03/30/2016 Elsevier Patient Education  2020 [employer].

## 2019-07-28 ENCOUNTER — Ambulatory Visit (HOSPITAL_COMMUNITY): Payer: Medicare Other | Admitting: Hematology

## 2019-08-01 ENCOUNTER — Other Ambulatory Visit (HOSPITAL_COMMUNITY)
Admission: RE | Admit: 2019-08-01 | Discharge: 2019-08-01 | Disposition: A | Discharge status: Home / Self Care | Payer: Medicare Other | Source: Ambulatory Visit | Attending: Internal Medicine | Admitting: Internal Medicine

## 2019-08-01 ENCOUNTER — Other Ambulatory Visit: Payer: Self-pay

## 2019-08-01 ENCOUNTER — Encounter (HOSPITAL_COMMUNITY)
Admission: RE | Admit: 2019-08-01 | Discharge: 2019-08-01 | Disposition: A | Discharge status: Home / Self Care | Payer: Medicare Other | Source: Ambulatory Visit | Attending: Internal Medicine | Admitting: Internal Medicine

## 2019-08-01 ENCOUNTER — Encounter (HOSPITAL_COMMUNITY): Payer: Self-pay

## 2019-08-01 DIAGNOSIS — Z538 Procedure and treatment not carried out for other reasons: Secondary | ICD-10-CM | POA: Diagnosis not present

## 2019-08-01 DIAGNOSIS — K219 Gastro-esophageal reflux disease without esophagitis: Secondary | ICD-10-CM | POA: Diagnosis not present

## 2019-08-01 DIAGNOSIS — Z20828 Contact with and (suspected) exposure to other viral communicable diseases: Secondary | ICD-10-CM | POA: Diagnosis not present

## 2019-08-01 DIAGNOSIS — Z79899 Other long term (current) drug therapy: Secondary | ICD-10-CM | POA: Diagnosis not present

## 2019-08-01 DIAGNOSIS — R079 Chest pain, unspecified: Secondary | ICD-10-CM | POA: Diagnosis not present

## 2019-08-01 DIAGNOSIS — M25552 Pain in left hip: Secondary | ICD-10-CM | POA: Diagnosis not present

## 2019-08-01 DIAGNOSIS — R072 Precordial pain: Secondary | ICD-10-CM | POA: Diagnosis not present

## 2019-08-01 DIAGNOSIS — R001 Bradycardia, unspecified: Secondary | ICD-10-CM | POA: Diagnosis not present

## 2019-08-01 DIAGNOSIS — Z7984 Long term (current) use of oral hypoglycemic drugs: Secondary | ICD-10-CM | POA: Diagnosis not present

## 2019-08-01 DIAGNOSIS — Z8711 Personal history of peptic ulcer disease: Secondary | ICD-10-CM | POA: Diagnosis not present

## 2019-08-01 DIAGNOSIS — E1122 Type 2 diabetes mellitus with diabetic chronic kidney disease: Secondary | ICD-10-CM | POA: Diagnosis not present

## 2019-08-01 DIAGNOSIS — I129 Hypertensive chronic kidney disease with stage 1 through stage 4 chronic kidney disease, or unspecified chronic kidney disease: Secondary | ICD-10-CM | POA: Diagnosis not present

## 2019-08-01 DIAGNOSIS — D649 Anemia, unspecified: Secondary | ICD-10-CM | POA: Diagnosis not present

## 2019-08-01 DIAGNOSIS — D693 Immune thrombocytopenic purpura: Secondary | ICD-10-CM | POA: Diagnosis not present

## 2019-08-01 DIAGNOSIS — M1612 Unilateral primary osteoarthritis, left hip: Secondary | ICD-10-CM | POA: Diagnosis not present

## 2019-08-01 DIAGNOSIS — F17210 Nicotine dependence, cigarettes, uncomplicated: Secondary | ICD-10-CM | POA: Diagnosis not present

## 2019-08-01 DIAGNOSIS — K7460 Unspecified cirrhosis of liver: Secondary | ICD-10-CM | POA: Diagnosis not present

## 2019-08-01 DIAGNOSIS — Z09 Encounter for follow-up examination after completed treatment for conditions other than malignant neoplasm: Secondary | ICD-10-CM | POA: Diagnosis not present

## 2019-08-01 DIAGNOSIS — N189 Chronic kidney disease, unspecified: Secondary | ICD-10-CM | POA: Diagnosis not present

## 2019-08-01 HISTORY — DX: Unspecified asthma, uncomplicated: J45.909

## 2019-08-01 LAB — CBC WITH DIFFERENTIAL/PLATELET
Abs Immature Granulocytes: 0.01 10*3/uL (ref 0.00–0.07)
Basophils Absolute: 0 10*3/uL (ref 0.0–0.1)
Basophils Relative: 1 %
Eosinophils Absolute: 0.1 10*3/uL (ref 0.0–0.5)
Eosinophils Relative: 5 %
HCT: 29.8 % — ABNORMAL LOW (ref 36.0–46.0)
Hemoglobin: 10.1 g/dL — ABNORMAL LOW (ref 12.0–15.0)
Immature Granulocytes: 0 %
Lymphocytes Relative: 32 %
Lymphs Abs: 0.9 10*3/uL (ref 0.7–4.0)
MCH: 36.2 pg — ABNORMAL HIGH (ref 26.0–34.0)
MCHC: 33.9 g/dL (ref 30.0–36.0)
MCV: 106.8 fL — ABNORMAL HIGH (ref 80.0–100.0)
Monocytes Absolute: 0.5 10*3/uL (ref 0.1–1.0)
Monocytes Relative: 17 %
Neutro Abs: 1.3 10*3/uL — ABNORMAL LOW (ref 1.7–7.7)
Neutrophils Relative %: 45 %
Platelets: 62 10*3/uL — ABNORMAL LOW (ref 150–400)
RBC: 2.79 MIL/uL — ABNORMAL LOW (ref 3.87–5.11)
RDW: 13.5 % (ref 11.5–15.5)
WBC: 2.9 10*3/uL — ABNORMAL LOW (ref 4.0–10.5)
nRBC: 0 % (ref 0.0–0.2)

## 2019-08-01 LAB — COMPREHENSIVE METABOLIC PANEL
ALT: 22 U/L (ref 0–44)
AST: 49 U/L — ABNORMAL HIGH (ref 15–41)
Albumin: 3.5 g/dL (ref 3.5–5.0)
Alkaline Phosphatase: 105 U/L (ref 38–126)
Anion gap: 11 (ref 5–15)
BUN: 13 mg/dL (ref 6–20)
CO2: 23 mmol/L (ref 22–32)
Calcium: 9.3 mg/dL (ref 8.9–10.3)
Chloride: 101 mmol/L (ref 98–111)
Creatinine, Ser: 1.16 mg/dL — ABNORMAL HIGH (ref 0.44–1.00)
GFR calc Af Amer: 59 mL/min — ABNORMAL LOW (ref >60)
GFR calc non Af Amer: 51 mL/min — ABNORMAL LOW (ref >60)
Glucose, Bld: 109 mg/dL — ABNORMAL HIGH (ref 70–99)
Potassium: 4.1 mmol/L (ref 3.5–5.1)
Sodium: 135 mmol/L (ref 135–145)
Total Bilirubin: 2 mg/dL — ABNORMAL HIGH (ref 0.3–1.2)
Total Protein: 7.7 g/dL (ref 6.5–8.1)

## 2019-08-01 LAB — HEMOGLOBIN A1C
Hgb A1c MFr Bld: 4.3 % — ABNORMAL LOW (ref 4.8–5.6)
Mean Plasma Glucose: 76.71 mg/dL

## 2019-08-01 LAB — PROTIME-INR
INR: 1.2 (ref 0.8–1.2)
Prothrombin Time: 15 seconds (ref 11.4–15.2)

## 2019-08-01 LAB — SARS CORONAVIRUS 2 (TAT 6-24 HRS): SARS Coronavirus 2: NEGATIVE

## 2019-08-02 ENCOUNTER — Ambulatory Visit: Payer: Medicare Other | Admitting: Nurse Practitioner

## 2019-08-02 ENCOUNTER — Other Ambulatory Visit (HOSPITAL_COMMUNITY): Payer: Medicare Other

## 2019-08-02 NOTE — Progress Notes (Signed)
We have referred to Hematology. Upcoming appt in late August.

## 2019-08-04 ENCOUNTER — Other Ambulatory Visit: Payer: Self-pay

## 2019-08-04 ENCOUNTER — Emergency Department (HOSPITAL_COMMUNITY): Payer: Medicare Other

## 2019-08-04 ENCOUNTER — Encounter (HOSPITAL_COMMUNITY): Payer: Self-pay

## 2019-08-04 ENCOUNTER — Ambulatory Visit (HOSPITAL_COMMUNITY): Payer: Medicare Other | Admitting: Anesthesiology

## 2019-08-04 ENCOUNTER — Ambulatory Visit (HOSPITAL_COMMUNITY)
Admission: RE | Admit: 2019-08-04 | Discharge: 2019-08-04 | Disposition: A | Discharge status: Still In Hospital | Payer: Medicare Other | Attending: Internal Medicine | Admitting: Internal Medicine

## 2019-08-04 ENCOUNTER — Encounter (HOSPITAL_COMMUNITY)
Admission: RE | Disposition: A | Discharge status: Still In Hospital | Payer: Self-pay | Source: Home / Self Care | Attending: Internal Medicine

## 2019-08-04 ENCOUNTER — Emergency Department (HOSPITAL_COMMUNITY)
Admission: EM | Admit: 2019-08-04 | Discharge: 2019-08-04 | Disposition: A | Discharge status: Home / Self Care | Payer: Medicare Other | Source: Home / Self Care | Attending: Emergency Medicine | Admitting: Emergency Medicine

## 2019-08-04 DIAGNOSIS — I10 Essential (primary) hypertension: Secondary | ICD-10-CM | POA: Insufficient documentation

## 2019-08-04 DIAGNOSIS — I129 Hypertensive chronic kidney disease with stage 1 through stage 4 chronic kidney disease, or unspecified chronic kidney disease: Secondary | ICD-10-CM | POA: Insufficient documentation

## 2019-08-04 DIAGNOSIS — Z79899 Other long term (current) drug therapy: Secondary | ICD-10-CM | POA: Insufficient documentation

## 2019-08-04 DIAGNOSIS — F17210 Nicotine dependence, cigarettes, uncomplicated: Secondary | ICD-10-CM | POA: Insufficient documentation

## 2019-08-04 DIAGNOSIS — E1122 Type 2 diabetes mellitus with diabetic chronic kidney disease: Secondary | ICD-10-CM | POA: Insufficient documentation

## 2019-08-04 DIAGNOSIS — Z8711 Personal history of peptic ulcer disease: Secondary | ICD-10-CM | POA: Insufficient documentation

## 2019-08-04 DIAGNOSIS — Z7984 Long term (current) use of oral hypoglycemic drugs: Secondary | ICD-10-CM | POA: Insufficient documentation

## 2019-08-04 DIAGNOSIS — K279 Peptic ulcer, site unspecified, unspecified as acute or chronic, without hemorrhage or perforation: Secondary | ICD-10-CM

## 2019-08-04 DIAGNOSIS — Z09 Encounter for follow-up examination after completed treatment for conditions other than malignant neoplasm: Secondary | ICD-10-CM | POA: Diagnosis not present

## 2019-08-04 DIAGNOSIS — R079 Chest pain, unspecified: Secondary | ICD-10-CM

## 2019-08-04 DIAGNOSIS — K219 Gastro-esophageal reflux disease without esophagitis: Secondary | ICD-10-CM | POA: Insufficient documentation

## 2019-08-04 DIAGNOSIS — R072 Precordial pain: Secondary | ICD-10-CM | POA: Diagnosis not present

## 2019-08-04 DIAGNOSIS — D693 Immune thrombocytopenic purpura: Secondary | ICD-10-CM | POA: Insufficient documentation

## 2019-08-04 DIAGNOSIS — Z20828 Contact with and (suspected) exposure to other viral communicable diseases: Secondary | ICD-10-CM | POA: Diagnosis not present

## 2019-08-04 DIAGNOSIS — Z538 Procedure and treatment not carried out for other reasons: Secondary | ICD-10-CM | POA: Insufficient documentation

## 2019-08-04 DIAGNOSIS — K7460 Unspecified cirrhosis of liver: Secondary | ICD-10-CM | POA: Insufficient documentation

## 2019-08-04 DIAGNOSIS — N189 Chronic kidney disease, unspecified: Secondary | ICD-10-CM | POA: Insufficient documentation

## 2019-08-04 DIAGNOSIS — J45909 Unspecified asthma, uncomplicated: Secondary | ICD-10-CM | POA: Insufficient documentation

## 2019-08-04 DIAGNOSIS — R001 Bradycardia, unspecified: Secondary | ICD-10-CM | POA: Insufficient documentation

## 2019-08-04 DIAGNOSIS — K7469 Other cirrhosis of liver: Secondary | ICD-10-CM

## 2019-08-04 DIAGNOSIS — E119 Type 2 diabetes mellitus without complications: Secondary | ICD-10-CM | POA: Insufficient documentation

## 2019-08-04 DIAGNOSIS — D649 Anemia, unspecified: Secondary | ICD-10-CM | POA: Insufficient documentation

## 2019-08-04 LAB — CBC
HCT: 30.7 % — ABNORMAL LOW (ref 36.0–46.0)
Hemoglobin: 10.4 g/dL — ABNORMAL LOW (ref 12.0–15.0)
MCH: 36.1 pg — ABNORMAL HIGH (ref 26.0–34.0)
MCHC: 33.9 g/dL (ref 30.0–36.0)
MCV: 106.6 fL — ABNORMAL HIGH (ref 80.0–100.0)
Platelets: 57 10*3/uL — ABNORMAL LOW (ref 150–400)
RBC: 2.88 MIL/uL — ABNORMAL LOW (ref 3.87–5.11)
RDW: 13.2 % (ref 11.5–15.5)
WBC: 2.6 10*3/uL — ABNORMAL LOW (ref 4.0–10.5)
nRBC: 0 % (ref 0.0–0.2)

## 2019-08-04 LAB — BASIC METABOLIC PANEL
Anion gap: 9 (ref 5–15)
BUN: 15 mg/dL (ref 6–20)
CO2: 24 mmol/L (ref 22–32)
Calcium: 8.7 mg/dL — ABNORMAL LOW (ref 8.9–10.3)
Chloride: 102 mmol/L (ref 98–111)
Creatinine, Ser: 1.32 mg/dL — ABNORMAL HIGH (ref 0.44–1.00)
GFR calc Af Amer: 51 mL/min — ABNORMAL LOW (ref >60)
GFR calc non Af Amer: 44 mL/min — ABNORMAL LOW (ref >60)
Glucose, Bld: 118 mg/dL — ABNORMAL HIGH (ref 70–99)
Potassium: 4 mmol/L (ref 3.5–5.1)
Sodium: 135 mmol/L (ref 135–145)

## 2019-08-04 LAB — LIPASE, BLOOD: Lipase: 39 U/L (ref 11–51)

## 2019-08-04 LAB — BRAIN NATRIURETIC PEPTIDE: B Natriuretic Peptide: 111 pg/mL — ABNORMAL HIGH (ref 0.0–100.0)

## 2019-08-04 LAB — GLUCOSE, CAPILLARY: Glucose-Capillary: 97 mg/dL (ref 70–99)

## 2019-08-04 LAB — TROPONIN I (HIGH SENSITIVITY)
Troponin I (High Sensitivity): 5 ng/L (ref <18)
Troponin I (High Sensitivity): 4 ng/L (ref <18)

## 2019-08-04 SURGERY — ESOPHAGOGASTRODUODENOSCOPY (EGD) WITH PROPOFOL
Anesthesia: General

## 2019-08-04 MED ORDER — KETAMINE HCL 50 MG/5ML IJ SOSY
PREFILLED_SYRINGE | INTRAMUSCULAR | Status: AC
  Filled 2019-08-04: qty 5

## 2019-08-04 MED ORDER — CHLORHEXIDINE GLUCONATE CLOTH 2 % EX PADS: 6.0000 | MEDICATED_PAD | Freq: Once | CUTANEOUS | Status: DC

## 2019-08-04 MED ORDER — ALUM & MAG HYDROXIDE-SIMETH 200-200-20 MG/5ML PO SUSP
30.0000 mL | Freq: Once | ORAL | Status: AC
  Administered 2019-08-04: 30 mL via ORAL
  Filled 2019-08-04: qty 30

## 2019-08-04 MED ORDER — LACTATED RINGERS IV SOLN
INTRAVENOUS | Status: DC
  Administered 2019-08-04: 09:00:00 via INTRAVENOUS

## 2019-08-04 MED ORDER — FENTANYL CITRATE (PF) 100 MCG/2ML IJ SOLN
50.0000 ug | Freq: Once | INTRAMUSCULAR | Status: AC
  Administered 2019-08-04: 50 ug via INTRAVENOUS
  Filled 2019-08-04: qty 2

## 2019-08-04 MED ORDER — SODIUM CHLORIDE 0.9% FLUSH
3.0000 mL | Freq: Once | INTRAVENOUS | Status: AC
  Administered 2019-08-04: 3 mL via INTRAVENOUS

## 2019-08-04 MED ORDER — MORPHINE SULFATE (PF) 4 MG/ML IV SOLN
4.0000 mg | Freq: Once | INTRAVENOUS | Status: AC
  Administered 2019-08-04: 4 mg via INTRAVENOUS
  Filled 2019-08-04: qty 1

## 2019-08-04 NOTE — Progress Notes (Signed)
Report called to [place] in ER.  See Dr. Roseanne Kaufman note.  Patient stable and taken to ER RM 11.

## 2019-08-04 NOTE — Anesthesia Preprocedure Evaluation (Signed)
Anesthesia Evaluation  Patient identified by MRN, date of birth, ID band Patient awake    Reviewed: Allergy & Precautions, NPO status , Patient's Chart, lab work & pertinent test results  Airway Mallampati: III  TM Distance: >3 FB Neck ROM: Full    Dental no notable dental hx. (+) Edentulous Upper   Pulmonary asthma , Current SmokerPatient did not abstain from smoking.,    Pulmonary exam normal breath sounds clear to auscultation       Cardiovascular Exercise Tolerance: Poor hypertension, Pt. on medications negative cardio ROS Normal cardiovascular examII Rhythm:Regular Rate:Normal  H/o Bradycardia not on B blockers  Decreased ET -states needs new hip Denies CP   Neuro/Psych  Headaches, PSYCHIATRIC DISORDERS Anxiety Depression  Neuromuscular disease    GI/Hepatic PUD, GERD  Medicated and Controlled,(+) Cirrhosis     substance abuse  alcohol use, Hepatitis -, B, CStates -no more Etoh  Cirrhosis  ITP denies spont bleeding episodes    Endo/Other  negative endocrine ROSdiabetes, Type 2  Renal/GU Renal InsufficiencyRenal disease  negative genitourinary   Musculoskeletal  (+) Arthritis , Osteoarthritis,    Abdominal   Peds negative pediatric ROS (+)  Hematology negative hematology ROS (+) anemia , ITP ~62K   Anesthesia Other Findings   Reproductive/Obstetrics negative OB ROS                             Anesthesia Physical Anesthesia Plan  ASA: IV  Anesthesia Plan: General   Post-op Pain Management:    Induction: Intravenous  PONV Risk Score and Plan: 2 and Propofol infusion, TIVA, Treatment may vary due to age or medical condition and Ondansetron  Airway Management Planned: Simple Face Mask and Nasal Cannula  Additional Equipment:   Intra-op Plan:   Post-operative Plan:   Informed Consent: I have reviewed the patients History and Physical, chart, labs and discussed the  procedure including the risks, benefits and alternatives for the proposed anesthesia with the patient or authorized representative who has indicated his/her understanding and acceptance.     Dental advisory given  Plan Discussed with: CRNA  Anesthesia Plan Comments: (Plan Full PPE  Plan MAC as tolerated  GETA as needed d/w pt -WTP with same  Recently done 05/19/19 without issues )        Anesthesia Quick Evaluation

## 2019-08-04 NOTE — ED Notes (Signed)
Pt states she was told she can eat. Crackers offered, pt refused.

## 2019-08-04 NOTE — ED Notes (Signed)
Family member has walked repeatedly to desk.  She stated "her and her mother were tired of the wait time".  We explained that we were full and several very sick patients.  Visitor was advised to please stay in the room and if they needed anything to ring call bell due to the Covid precautions.  Visitor advised that she would stand in the doorway.  Advised several times that door needed to remain closed at all times.  Patient and visitor refused to allow door to be closed.  Dr in room - discussed lab results with visitor and patient.  Visitor requested "food".  Drinks and cracker were taken to the room - visitor refused both.  She stated she needed a sandwich.  We advised visitor that we did not have sandwiches.  Both still refused snacks.    Visitor constantly complaining due to "wait time".  We advised again that the E.R. was full and the doctor was doing all he could do.  Visitor advised "that in my opinion everyone is rude".

## 2019-08-04 NOTE — ED Provider Notes (Signed)
[facility] EMERGENCY DEPARTMENT Provider Note   CSN: [id number] Arrival date & time: 08/04/19  9323    History   Chief Complaint Chief Complaint  Patient presents with  . Chest Pain    HPI Gabriella Leon is a 63 y.o. female.  Presents emerge department chief complaint chest pain.  Patient states that she was scheduled for an endoscopy this morning however at time of checking and she notified staff that she was having severe chest pain and a recommended patient come to the emergency department for evaluation.  Patient reports that her symptoms started yesterday evening, while watching TV.  States central chest pain, nonradiating, no alleviating or aggravating factors.  States episodes last for a few minutes at a time and resolve spontaneously.  Has not taken any medication for her pain.  Describes it as a ache.  Today continues to have random episodes of similar chest pain.  States currently her pain is 6 out of 10 in severity.  Is not associated with shortness of breath.  Has had mild amount of nausea associated with this, no associated abdominal pain or vomiting.  Reviewed chart, notes from prior this morning.  63 year old lady who has history of gastric ulcer disease going for a surveillance EGD.  Complained of 10 out of 10 chest pain and referred to ER for chest pain work-up.    HPI  Past Medical History:  Diagnosis Date  . Acid reflux disease with ulcer   . Anxiety   . Arthritis   . Asthma   . Blood dyscrasia    thrombocytopenia-next tx injection 09/25/15  . Blood transfusion   . Chronic kidney disease   . Cirrhosis ([place])   . DDD (degenerative disc disease), lumbar   . Depression   . Diabetes mellitus   . Esophageal varices ([place]) 09/18/2014  . Folate deficiency 03/28/2014   Noted on 03/23/2014.  Folate 1 mg prescribed.  . Gallstones   . GERD (gastroesophageal reflux disease)   . Headache    migraines  . Helicobacter pylori ab+    July 2010, s/p treatment  .  Hepatitis B antibody positive   . Hepatitis C    and B positive antibody  . History of alcoholism ([place])   . History of kidney stones   . Hypertension   . Left leg pain   . Liver cirrhosis ([place])   . Low back pain   . Other pancytopenia ([place]) 10/27/2013  . Ulcer    ?    Patient Active Problem List   Diagnosis Date Noted  . PUD (peptic ulcer disease) 06/21/2019  . Left hip pain 12/06/2018  . Chronic pain of left knee 12/06/2018  . Chronic left-sided low back pain with left-sided sciatica 12/06/2018  . Vitamin D deficiency 04/14/2016  . Elevated ferritin 02/14/2016  . Cervical stenosis of spinal canal 09/28/2015  . Idiopathic thrombocytopenic purpura ([place]) 08/29/2015  . Thrombocytopenia ([place]) 08/29/2015  . Hepatic cirrhosis due to chronic hepatitis C infection ([place]) 04/03/2015  . Esophageal varices ([place]) 09/18/2014  . Folate deficiency 03/28/2014  . Unspecified constipation 03/22/2014  . Other pancytopenia ([place]) 10/27/2013  . Hypersplenism syndrome 09/18/2013  . Hepatitis C, chronic ([place]) 09/14/2013  . Lower back pain 06/16/2013  . ETOH abuse 03/03/2013  . RUQ pain 11/07/2012  . Epigastric pain 02/04/2012  . Cirrhosis ([place]) 11/26/2011  . Abdominal pain 11/26/2011  . Screening for colon cancer 11/26/2011    Past Surgical History:  Procedure Laterality Date  . ANTERIOR CERVICAL DECOMP/DISCECTOMY  FUSION N/A 09/28/2015   Procedure: Cervical three-four Anterior cervical decompression/diskectomy/fusion;  Surgeon: Leeroy Cha, MD;  Location: [facility];  Service: Neurosurgery;  Laterality: N/A;  C3-4 Anterior cervical decompression/diskectomy/fusion  . APPENDECTOMY     age 29  . BIOPSY  12/18/2011      . BIOPSY  05/19/2019   Procedure: BIOPSY;  Surgeon: Daneil Dolin, MD;  Location: [facility];  Service: Endoscopy;;  gastric  . [place]  greater than 10 yrs   [place]  . COLONOSCOPY  12/18/11   minimal anal canal hemorrhoids,  friable rectal and colonic mucosa, left-sided diverticulosis, repeat in 2022.   . ESOPHAGOGASTRODUODENOSCOPY  12/18/11   3 columns of Grade II esophageal varices, reflux esohpagitis, portal gastropathy, path with chronic gastritis, negative H.pylori, surveillance in June 2014  . ESOPHAGOGASTRODUODENOSCOPY (EGD) WITH PROPOFOL N/A 04/13/2014   Dr. [name]: grade 1-2 varices, hiatal hernia, portal gastropathy. NO need for further surveillance while on prophylaxis unless evidence of bleeding  . ESOPHAGOGASTRODUODENOSCOPY (EGD) WITH PROPOFOL N/A 05/19/2019   erosive reflux esophagitis, Grade 1-2 esophageal varices with patulous EG junction. Hiatal hernia. Portal gastropathy. Gastric and duodenal dulcer disease.   . MULTIPLE EXTRACTIONS WITH ALVEOLOPLASTY  03/08/2012   Procedure: MULTIPLE EXTRACION WITH ALVEOLOPLASTY;  Surgeon: Gae Bon, DDS;  Location: [facility];  Service: Oral Surgery;  Laterality: N/A;     OB History   No obstetric history on file.      Home Medications    Prior to Admission medications   Medication Sig Start Date End Date Taking? Authorizing Provider  famotidine (PEPCID) 20 MG tablet Take 20 mg by mouth 2 (two) times daily.  07/20/19  Yes [provider]  furosemide (LASIX) 40 MG tablet Take 40 mg by mouth daily.    Yes [provider]  glimepiride (AMARYL) 2 MG tablet Take 2 mg by mouth daily as needed (if blood sugar is >160).    Yes [provider]  HYDROcodone-acetaminophen (NORCO/VICODIN) 5-325 MG per tablet Take 1 tablet by mouth every 6 (six) hours as needed for moderate pain.   Yes [provider]  lisinopril-hydrochlorothiazide (PRINZIDE,ZESTORETIC) 10-12.5 MG per tablet Take 1 tablet by mouth daily.   Yes [provider]  metFORMIN (GLUCOPHAGE) 500 MG tablet Take 500 mg by mouth daily with breakfast.  07/20/19  Yes [provider]  nadolol (CORGARD) 40 MG tablet Take 1 tablet by mouth daily.  05/19/19  Yes [provider]  pantoprazole (PROTONIX) 40 MG tablet Take 1 tablet by mouth 2 (two) times a day. 05/19/19  Yes [provider]  spironolactone (ALDACTONE) 25 MG tablet Take 1 tablet (25 mg total) by mouth daily. 06/21/19  Yes Annitta Needs, NP    Family History Family History  Problem Relation Age of Onset  . Diabetes Mother   . Cancer Paternal Uncle        passed away age 59  . Other Father        died in [place] in 41  . Anesthesia problems Neg Hx   . Hypotension Neg Hx   . Malignant hyperthermia Neg Hx   . Pseudochol deficiency Neg Hx   . Colon cancer Neg Hx   . Liver disease Neg Hx     Social History Social History   Tobacco Use  . Smoking status: Current Every Day Smoker    Packs/day: 0.50    Years: 20.00    Pack  years: 10.00    Types: Cigarettes  . Smokeless tobacco: Never Used  Substance Use Topics  . Alcohol use: No    Comment: last use 2015  . Drug use: No     Allergies   Patient has no known allergies.   Review of Systems Review of Systems  Constitutional: Negative for chills and fever.  HENT: Negative for ear pain and sore throat.   Eyes: Negative for pain and visual disturbance.  Respiratory: Negative for cough and shortness of breath.   Cardiovascular: Positive for chest pain. Negative for palpitations.  Gastrointestinal: Negative for abdominal pain and vomiting.  Genitourinary: Negative for dysuria and hematuria.  Musculoskeletal: Negative for arthralgias and back pain.  Skin: Negative for color change and rash.  Neurological: Negative for seizures and syncope.  All other systems reviewed and are negative.    Physical Exam Updated Vital Signs BP (!) 128/57   Pulse (!) 52   Temp 97.6 F (36.4 C) (Oral)   Resp 11   Ht 5\' 4"  (1.626 m)   Wt 72.6 kg   SpO2 100%   BMI 27.46 kg/m   Physical Exam Vitals signs and nursing note reviewed.  Constitutional:      General: She is not in acute distress.    Appearance: She is  well-developed.  HENT:     Head: Normocephalic and atraumatic.  Eyes:     Conjunctiva/sclera: Conjunctivae normal.  Neck:     Musculoskeletal: Neck supple.  Cardiovascular:     Rate and Rhythm: Normal rate and regular rhythm.     Heart sounds: No murmur.  Pulmonary:     Effort: Pulmonary effort is normal. No respiratory distress.     Breath sounds: Normal breath sounds.  Abdominal:     Palpations: Abdomen is soft.     Tenderness: There is no abdominal tenderness.  Skin:    General: Skin is warm and dry.  Neurological:     Mental Status: She is alert.      ED Treatments / Results  Labs (all labs ordered are listed, but only abnormal results are displayed) Labs Reviewed  BASIC METABOLIC PANEL - Abnormal; Notable for the following components:      Result Value   Glucose, Bld 118 (*)    Creatinine, Ser 1.32 (*)    Calcium 8.7 (*)    GFR calc non Af Amer 44 (*)    GFR calc Af Amer 51 (*)    All other components within normal limits  CBC - Abnormal; Notable for the following components:   WBC 2.6 (*)    RBC 2.88 (*)    Hemoglobin 10.4 (*)    HCT 30.7 (*)    MCV 106.6 (*)    MCH 36.1 (*)    Platelets 57 (*)    All other components within normal limits  BRAIN NATRIURETIC PEPTIDE - Abnormal; Notable for the following components:   B Natriuretic Peptide 111.0 (*)    All other components within normal limits  TROPONIN I (HIGH SENSITIVITY)  TROPONIN I (HIGH SENSITIVITY)    EKG EKG Interpretation  Date/Time:  Thursday August 04 2019 09:56:05 EDT Ventricular Rate:  51 PR Interval:    QRS Duration: 90 QT Interval:  483 QTC Calculation: 445 R Axis:   80 Text Interpretation:  Sinus rhythm Probable anteroseptal infarct, old No STEMI Confirmed by Madalyn Rob [phone]) on 08/04/2019 11:02:18 AM   Radiology No results found.  Procedures Procedures (including critical care time)  Medications Ordered  in ED Medications  sodium chloride flush (NS) 0.9 % injection 3  mL (3 mLs Intravenous Given 08/04/19 1125)  fentaNYL (SUBLIMAZE) injection 50 mcg (50 mcg Intravenous Given 08/04/19 1121)     Initial Impression / Assessment and Plan / ED Course  I have reviewed the triage vital signs and the nursing notes.  Pertinent labs & imaging results that were available during my care of the patient were reviewed by me and considered in my medical decision making (see chart for details).  Clinical Course as of Aug 04 1635  Thu Aug 04, 2019  1303 Rechecked patient, reviewed results, still having some lingering pain, now states more epigastric; interested in eating - will give additional pain meds, attempt PO trial   [RD]    Clinical Course User Index [RD] Lucrezia Starch, MD       89-year-old lady presents emergency department chief complaint chest pain.  EGD previously scheduled for today has been canceled due to this new chest pain.  Here patient did be well-appearing with normal vital signs.  EKG without acute ischemic changes, serial troponin within normal limits, no delta troponin.  Given description of symptoms and above work-up, low suspicion for ACS.  Chest x-ray without acute pulmonary pathology.  No significant shortness of breath, no hypoxia or tachypnea, doubt pulmonary embolism.  More likely etiology related to gastritis, reflux for esophageal spasm.  Recommend patient follow-up with her GI doctor as well as her primary care doctor.  Reviewed return precautions, believe stable for outpatient management this time.  After the discussed management above, the patient was determined to be safe for discharge.  The patient was in agreement with this plan and all questions regarding their care were answered.  ED return precautions were discussed and the patient will return to the ED with any significant worsening of condition.   Final Clinical Impressions(s) / ED Diagnoses   Final diagnoses:  Chest pain, unspecified type    ED Discharge Orders    None        Lucrezia Starch, MD 08/04/19 [phone]

## 2019-08-04 NOTE — H&P (Deleted)
@LOGO @   Primary Care Physician:  Neale Burly, MD Primary Gastroenterologist:  Dr. [name]  Pre-Procedure History & Physical: HPI:  Gabriella Leon is a 63 y.o. female here for surveillance EGD.  History of gastric ulcer seen previously.  Here to verify healing.  Past Medical History:  Diagnosis Date  . Acid reflux disease with ulcer   . Anxiety   . Arthritis   . Asthma   . Blood dyscrasia    thrombocytopenia-next tx injection 09/25/15  . Blood transfusion   . Chronic kidney disease   . Cirrhosis ([place])   . DDD (degenerative disc disease), lumbar   . Depression   . Diabetes mellitus   . Esophageal varices ([place]) 09/18/2014  . Folate deficiency 03/28/2014   Noted on 03/23/2014.  Folate 1 mg prescribed.  . Gallstones   . GERD (gastroesophageal reflux disease)   . Headache    migraines  . Helicobacter pylori ab+    July 2010, s/p treatment  . Hepatitis B antibody positive   . Hepatitis C    and B positive antibody  . History of alcoholism ([place])   . History of kidney stones   . Hypertension   . Left leg pain   . Liver cirrhosis ([place])   . Low back pain   . Other pancytopenia ([place]) 10/27/2013  . Ulcer    ?    Past Surgical History:  Procedure Laterality Date  . ANTERIOR CERVICAL DECOMP/DISCECTOMY FUSION N/A 09/28/2015   Procedure: Cervical three-four Anterior cervical decompression/diskectomy/fusion;  Surgeon: Leeroy Cha, MD;  Location: [facility];  Service: Neurosurgery;  Laterality: N/A;  C3-4 Anterior cervical decompression/diskectomy/fusion  . APPENDECTOMY     age 65  . BIOPSY  12/18/2011      . BIOPSY  05/19/2019   Procedure: BIOPSY;  Surgeon: Daneil Dolin, MD;  Location: [facility];  Service: Endoscopy;;  gastric  . [place]  greater than 10 yrs   [place]  . COLONOSCOPY  12/18/11   minimal anal canal hemorrhoids, friable rectal and colonic mucosa, left-sided diverticulosis, repeat in 2022.   .  ESOPHAGOGASTRODUODENOSCOPY  12/18/11   3 columns of Grade II esophageal varices, reflux esohpagitis, portal gastropathy, path with chronic gastritis, negative H.pylori, surveillance in June 2014  . ESOPHAGOGASTRODUODENOSCOPY (EGD) WITH PROPOFOL N/A 04/13/2014   Dr. [name]: grade 1-2 varices, hiatal hernia, portal gastropathy. NO need for further surveillance while on prophylaxis unless evidence of bleeding  . ESOPHAGOGASTRODUODENOSCOPY (EGD) WITH PROPOFOL N/A 05/19/2019   erosive reflux esophagitis, Grade 1-2 esophageal varices with patulous EG junction. Hiatal hernia. Portal gastropathy. Gastric and duodenal dulcer disease.   . MULTIPLE EXTRACTIONS WITH ALVEOLOPLASTY  03/08/2012   Procedure: MULTIPLE EXTRACION WITH ALVEOLOPLASTY;  Surgeon: Gae Bon, DDS;  Location: [facility];  Service: Oral Surgery;  Laterality: N/A;    Prior to Admission medications   Medication Sig Start Date End Date Taking? Authorizing Provider  famotidine (PEPCID) 20 MG tablet Take 20 mg by mouth 2 (two) times daily.  07/20/19  Yes [provider]  furosemide (LASIX) 40 MG tablet Take 40 mg by mouth daily.    Yes [provider]  glimepiride (AMARYL) 2 MG tablet Take 2 mg by mouth daily as needed (if blood sugar is >160).    Yes [provider]  HYDROcodone-acetaminophen (NORCO/VICODIN) 5-325 MG per tablet Take 1 tablet by mouth every 6 (six) hours as needed for moderate pain.   Yes [provider]  lisinopril-hydrochlorothiazide (PRINZIDE,ZESTORETIC) 10-12.5 MG per tablet Take 1 tablet by mouth daily.   Yes [provider]  metFORMIN (GLUCOPHAGE) 500 MG tablet Take 500 mg by mouth daily with breakfast.  07/20/19  Yes [provider]  nadolol (CORGARD) 40 MG tablet Take 1 tablet by mouth daily.  05/19/19  Yes [provider]  pantoprazole (PROTONIX) 40 MG tablet Take 1 tablet by mouth 2 (two) times a day. 05/19/19  Yes [provider]  spironolactone  (ALDACTONE) 25 MG tablet Take 1 tablet (25 mg total) by mouth daily. 06/21/19  Yes Annitta Needs, NP    Allergies as of 06/21/2019  . (No Known Allergies)    Family History  Problem Relation Age of Onset  . Diabetes Mother   . Cancer Paternal Uncle        passed away age 63  . Other Father        died in [place] in 74  . Anesthesia problems Neg Hx   . Hypotension Neg Hx   . Malignant hyperthermia Neg Hx   . Pseudochol deficiency Neg Hx   . Colon cancer Neg Hx   . Liver disease Neg Hx     Social History   Socioeconomic History  . Marital status: Legally Separated    Spouse name: Not on file  . Number of children: 1  . Years of education: 10th grade  . Highest education level: Not on file  Occupational History  . Occupation: disabled    Fish farm manager: NOT EMPLOYED  Social Needs  . Financial resource strain: Not on file  . Food insecurity    Worry: Not on file    Inability: Not on file  . Transportation needs    Medical: Not on file    Non-medical: Not on file  Tobacco Use  . Smoking status: Current Every Day Smoker    Packs/day: 0.50    Years: 20.00    Pack years: 10.00    Types: Cigarettes  . Smokeless tobacco: Never Used  Substance and Sexual Activity  . Alcohol use: No    Comment: last use 2015  . Drug use: No  . Sexual activity: Yes    Birth control/protection: Post-menopausal  Lifestyle  . Physical activity    Days per week: Not on file    Minutes per session: Not on file  . Stress: Not on file  Relationships  . Social Herbalist on phone: Not on file    Gets together: Not on file    Attends religious service: Not on file    Active member of club or organization: Not on file    Attends meetings of clubs or organizations: Not on file    Relationship status: Not on file  . Intimate partner violence    Fear of current or ex partner: Not on file    Emotionally abused: Not on file    Physically abused: Not on file    Forced sexual activity: Not  on file  Other Topics Concern  . Not on file  Social History Narrative   Lives at home alone.   Right-handed.   Occasional caffeine use.    Review of Systems: See HPI, otherwise negative ROS  Physical Exam: BP (!) 122/58   Pulse (!) 58   Temp 98.6 F (37 C) (Oral)   Resp 16   SpO2 99%  General:   Alert,  Well-developed, well-nourished, pleasant and cooperative in NAD Mouth:  No  deformity or lesions. Neck:  Supple; no masses or thyromegaly. No significant cervical adenopathy. Lungs:  Clear throughout to auscultation.   No wheezes, crackles, or rhonchi. No acute distress. Heart:  Regular rate and rhythm; no murmurs, clicks, rubs,  or gallops. Abdomen: Non-distended, normal bowel sounds.  Soft and nontender without appreciable mass or hepatosplenomegaly.  Pulses:  Normal pulses noted. Extremities:  Without clubbing or edema.  Impression/Plan: 63 year old history of cirrhosis and recent gastric ulcer.  Here for surveillance to verify healing.The risks, benefits, limitations, alternatives and imponderables have been reviewed with the patient. Potential for esophageal dilation, biopsy, etc. have also been reviewed.  Questions have been answered. All parties agreeable.     Notice: This dictation was prepared with Dragon dictation along with smaller phrase technology. Any transcriptional errors that result from this process are unintentional and may not be corrected upon review.

## 2019-08-04 NOTE — Interval H&P Note (Signed)
History and Physical Interval Note:  08/04/2019 9:55 AM  Gabriella Leon  has presented today for surgery, with the diagnosis of PUD, cirrhosis.  The various methods of treatment have been discussed with the patient and family. After consideration of risks, benefits and other options for treatment, the patient has consented to  Procedure(s) with comments: ESOPHAGOGASTRODUODENOSCOPY (EGD) WITH PROPOFOL (N/A) - 10:00am as a surgical intervention.  The patient's history has been reviewed, patient examined, no change in status, stable for surgery.  I have reviewed the patient's chart and labs.  Questions were answered to the patient's satisfaction.     Manus Rudd  Discussed reasoning behind cancellation of EGD and transfer patient to ED with patient's niece, Janett Billow at [phone]

## 2019-08-04 NOTE — H&P (Signed)
Patient now complaining of a 10 out of 10 retrosternal chest pain off and on since last night.  Reminds her of her cardiac issues previously.  Says her reflux symptoms are well controlled.  Also bradycardic.  We are doing an entirely elective procedure today.  Discussed with anesthesia.  I feel best course of action would be to cancel the procedure and get the patient to the emergency department for further evaluation.  Discussed with patient at length.  She agrees with plan.

## 2019-08-04 NOTE — Discharge Instructions (Signed)
Recommend calling your GI doctor to reschedule your endoscopy.  Please call your primary doctor for close recheck

## 2019-08-04 NOTE — ED Triage Notes (Signed)
The pt was scheduled for endoscopy this morning. Reported that she was having 10/10 chest pain since yesterday. Brought to ED for evaluation

## 2019-08-09 NOTE — Addendum Note (Signed)
Addendum  created 08/09/19 1129 by Ollen Bowl, CRNA   Charge Capture section accepted

## 2019-08-11 ENCOUNTER — Other Ambulatory Visit: Payer: Self-pay

## 2019-08-11 ENCOUNTER — Inpatient Hospital Stay (HOSPITAL_COMMUNITY): Payer: Medicare Other | Attending: Hematology | Admitting: Hematology

## 2019-08-11 DIAGNOSIS — Z7984 Long term (current) use of oral hypoglycemic drugs: Secondary | ICD-10-CM | POA: Diagnosis not present

## 2019-08-11 DIAGNOSIS — N189 Chronic kidney disease, unspecified: Secondary | ICD-10-CM | POA: Insufficient documentation

## 2019-08-11 DIAGNOSIS — R7989 Other specified abnormal findings of blood chemistry: Secondary | ICD-10-CM | POA: Diagnosis not present

## 2019-08-11 DIAGNOSIS — I10 Essential (primary) hypertension: Secondary | ICD-10-CM | POA: Insufficient documentation

## 2019-08-11 DIAGNOSIS — E119 Type 2 diabetes mellitus without complications: Secondary | ICD-10-CM | POA: Diagnosis not present

## 2019-08-11 DIAGNOSIS — F419 Anxiety disorder, unspecified: Secondary | ICD-10-CM | POA: Insufficient documentation

## 2019-08-11 DIAGNOSIS — Z79899 Other long term (current) drug therapy: Secondary | ICD-10-CM | POA: Insufficient documentation

## 2019-08-11 DIAGNOSIS — D61818 Other pancytopenia: Secondary | ICD-10-CM | POA: Diagnosis not present

## 2019-08-11 DIAGNOSIS — F17210 Nicotine dependence, cigarettes, uncomplicated: Secondary | ICD-10-CM | POA: Diagnosis not present

## 2019-08-11 NOTE — Assessment & Plan Note (Addendum)
1.  Elevated ferritin levels: - This patient has a history of hepatitis C and cirrhosis. - Alcohol abuse, quit completely 8 years ago. - Labs on 06/21/2019 shows ferritin of 836 and percent saturation of 93. - Elevated ferritin in the past from 2012, in the range of 400-680. - CT [facility] on 07/06/2019 shows nodular contour of the liver with cirrhosis.  Spleen is normal.  Significant varices present.  No significant ascites.  No adenopathy. - No family history of hemochromatosis or malignancies. - Hemochromatosis labs were negative.  Elevated ferritin levels most likely secondary to liver disease.  2.  Pancytopenia: -Labs on 06/21/2019 shows white count of 1.6 with ANC of 890.  Platelet count is 47. -CT on 07/06/2019 shows normal spleen.  Does not report any recurrent infections.  Does not report any fevers, night sweats or weight loss. -Labs from August 04, 2019 are still consistent with pancytopenia with white blood cell count of 2.6, hemoglobin of 10.4, platelet count of 57,000.  No differential was done.  Differential diagnosis includes hypersplenism, bone marrow infiltrate or failure.  Have recommended patient proceed with bone marrow biopsy and aspirate to determine underlying etiology.   3.  Smoking history: -Current active smoker, smokes half pack per day for the past 36 years.  Will recommend low-dose CT screening at her next visit.  4.  Avascular necrosis of left femoral head.   - Patient is scheduled for hip replacement on September 1.  Would advise patient complete work-up for pancytopenia before undergoing hip arthroplasty.

## 2019-08-11 NOTE — Progress Notes (Signed)
 [facility] [address] [place], [place] [address]   CLINIC:  Medical Oncology/Hematology  PCP:  Hasanaj, Xaje A, MD [address] [place] [address] [phone]   REASON FOR VISIT:  Follow-up for Pancytopenia  CURRENT THERAPY: Clinical surveillance    INTERVAL HISTORY:  Gabriella Leon 62 y.o. female [place] today for follow-up.  She reports overall doing fair.  She reports of increasing fatigue over the last couple months she denies any fevers or chills or night sweats.  Her main concern today is left hip pain.  Patient has been diagnosed with avascular necrosis of the left bullhead with evidence of sub-chondral fracture.  She is scheduled to have a hip replacement surgery on September 1.  She is here to discuss results of recent labs.   REVIEW OF SYSTEMS:  Review of Systems  Constitutional: Positive for fatigue.  HENT:  Negative.   Eyes: Negative.   Respiratory: Negative.   Cardiovascular: Negative.   Gastrointestinal: Negative.   Endocrine: Negative.   Genitourinary: Negative.    Musculoskeletal: Positive for arthralgias, gait problem and myalgias.  Skin: Negative.   Neurological: Positive for extremity weakness and gait problem.  Hematological: Negative.   Psychiatric/Behavioral: Negative.      PAST MEDICAL/SURGICAL HISTORY:  Past Medical History:  Diagnosis Date  . Acid reflux disease with ulcer   . Anxiety   . Arthritis   . Asthma   . Blood dyscrasia    thrombocytopenia-next tx injection 09/25/15  . Blood transfusion   . Chronic kidney disease   . Cirrhosis ([facility])   . DDD (degenerative disc disease), lumbar   . Depression   . Diabetes mellitus   . Esophageal varices ([facility]) 09/18/2014  . Folate deficiency 03/28/2014   Noted on 03/23/2014.  Folate 1 mg prescribed.  . Gallstones   . GERD (gastroesophageal reflux disease)   . Headache    migraines  . Helicobacter pylori ab+    July 2010, s/p treatment  . Hepatitis B antibody  positive   . Hepatitis C    and B positive antibody  . History of alcoholism ([facility])   . History of kidney stones   . Hypertension   . Left leg pain   . Liver cirrhosis ([facility])   . Low back pain   . Other pancytopenia ([facility]) 10/27/2013  . Ulcer    ?   Past Surgical History:  Procedure Laterality Date  . ANTERIOR CERVICAL DECOMP/DISCECTOMY FUSION N/A 09/28/2015   Procedure: Cervical three-four Anterior cervical decompression/diskectomy/fusion;  Surgeon: Ernesto Botero, MD;  Location: [facility];  Service: Neurosurgery;  Laterality: N/A;  C3-4 Anterior cervical decompression/diskectomy/fusion  . APPENDECTOMY     age 16  . BIOPSY  12/18/2011      . BIOPSY  05/19/2019   Procedure: BIOPSY;  Surgeon: Rourk, Robert M, MD;  [facility];  Service: Endoscopy;;  gastric  . CESAREAN SECTION  1988   [facility]  . CHOLECYSTECTOMY  greater than 10 yrs   [facility]  . COLONOSCOPY  12/18/11   minimal anal canal hemorrhoids, friable rectal and colonic mucosa, left-sided diverticulosis, repeat in 2022.   . ESOPHAGOGASTRODUODENOSCOPY  12/18/11   3 columns of Grade II esophageal varices, reflux esohpagitis, portal gastropathy, path with chronic gastritis, negative H.pylori, surveillance in June 2014  . ESOPHAGOGASTRODUODENOSCOPY (EGD) WITH PROPOFOL N/A 04/13/2014   Dr. Rourk: grade 1-2 varices, hiatal hernia, portal gastropathy. NO need for further surveillance while on prophylaxis unless evidence of bleeding  .   ESOPHAGOGASTRODUODENOSCOPY (EGD) WITH PROPOFOL N/A 05/19/2019   erosive reflux esophagitis, Grade 1-2 esophageal varices with patulous EG junction. Hiatal hernia. Portal gastropathy. Gastric and duodenal dulcer disease.   . MULTIPLE EXTRACTIONS WITH ALVEOLOPLASTY  03/08/2012   Procedure: MULTIPLE EXTRACION WITH ALVEOLOPLASTY;  Surgeon: Scott M Jensen, DDS;  Location: [facility];  Service: Oral Surgery;  Laterality: N/A;     SOCIAL HISTORY:  Social History   Socioeconomic History  . Marital status:  Legally Separated    Spouse name: Not on file  . Number of children: 1  . Years of education: 10th grade  . Highest education level: Not on file  Occupational History  . Occupation: disabled    Employer: NOT EMPLOYED  Social Needs  . Financial resource strain: Not on file  . Food insecurity    Worry: Not on file    Inability: Not on file  . Transportation needs    Medical: Not on file    Non-medical: Not on file  Tobacco Use  . Smoking status: Current Every Day Smoker    Packs/day: 0.50    Years: 20.00    Pack years: 10.00    Types: Cigarettes  . Smokeless tobacco: Never Used  Substance and Sexual Activity  . Alcohol use: No    Comment: last use 2015  . Drug use: No  . Sexual activity: Yes    Birth control/protection: Post-menopausal  Lifestyle  . Physical activity    Days per week: Not on file    Minutes per session: Not on file  . Stress: Not on file  Relationships  . Social connections    Talks on phone: Not on file    Gets together: Not on file    Attends religious service: Not on file    Active member of club or organization: Not on file    Attends meetings of clubs or organizations: Not on file    Relationship status: Not on file  . Intimate partner violence    Fear of current or ex partner: Not on file    Emotionally abused: Not on file    Physically abused: Not on file    Forced sexual activity: Not on file  Other Topics Concern  . Not on file  Social History Narrative   Lives at home alone.   Right-handed.   Occasional caffeine use.    FAMILY HISTORY:  Family History  Problem Relation Age of Onset  . Diabetes Mother   . Cancer Paternal Uncle        passed away age 61  . Other Father        died in [place] in 1990  . Anesthesia problems Neg Hx   . Hypotension Neg Hx   . Malignant hyperthermia Neg Hx   . Pseudochol deficiency Neg Hx   . Colon cancer Neg Hx   . Liver disease Neg Hx     CURRENT MEDICATIONS:  Outpatient Encounter Medications as  of 08/11/2019  Medication Sig  . famotidine (PEPCID) 20 MG tablet Take 20 mg by mouth 2 (two) times daily.   . furosemide (LASIX) 40 MG tablet Take 40 mg by mouth daily.   . glimepiride (AMARYL) 2 MG tablet Take 2 mg by mouth daily as needed (if blood sugar is >160).   . HYDROcodone-acetaminophen (NORCO/VICODIN) 5-325 MG per tablet Take 1 tablet by mouth every 6 (six) hours as needed for moderate pain.  . lisinopril-hydrochlorothiazide (PRINZIDE,ZESTORETIC) 10-12.5 MG per tablet Take 1 tablet by mouth daily.  .   metFORMIN (GLUCOPHAGE) 500 MG tablet Take 500 mg by mouth daily with breakfast.   . nadolol (CORGARD) 40 MG tablet Take 1 tablet by mouth daily.   . pantoprazole (PROTONIX) 40 MG tablet Take 1 tablet by mouth 2 (two) times a day.  . spironolactone (ALDACTONE) 25 MG tablet Take 1 tablet (25 mg total) by mouth daily.   No facility-administered encounter medications on file as of 08/11/2019.     ALLERGIES:  Allergies  Allergen Reactions  . Other      PHYSICAL EXAM:  ECOG Performance status: 3  Vitals:   08/11/19 1344  BP: 97/63  Pulse: 60  Resp: 18  Temp: (!) 97.1 F (36.2 C)  SpO2: 100%   Filed Weights   08/11/19 1344  Weight: 167 lb (75.8 kg)    Physical Exam Constitutional:      Appearance: Normal appearance. She is obese.  HENT:     Head: Normocephalic.     Right Ear: External ear normal.     Left Ear: External ear normal.     Nose: Nose normal.     Mouth/Throat:     Mouth: Mucous membranes are moist.     Pharynx: Oropharynx is clear.  Eyes:     Extraocular Movements: Extraocular movements intact.     Conjunctiva/sclera: Conjunctivae normal.  Neck:     Musculoskeletal: Normal range of motion.  Cardiovascular:     Rate and Rhythm: Normal rate and regular rhythm.     Pulses: Normal pulses.     Heart sounds: Normal heart sounds.  Pulmonary:     Effort: Pulmonary effort is normal.     Breath sounds: Normal breath sounds.  Abdominal:     General:  Abdomen is flat. Bowel sounds are normal.  Musculoskeletal:     Comments: Unable to bear weight on left leg secondary to AVN  Skin:    General: Skin is warm and dry.  Neurological:     General: No focal deficit present.     Mental Status: She is alert and oriented to person, place, and time.  Psychiatric:        Mood and Affect: Mood normal.        Behavior: Behavior normal.        Thought Content: Thought content normal.        Judgment: Judgment normal.      LABORATORY DATA:  I have reviewed the labs as listed.  CBC    Component Value Date/Time   WBC 2.6 (L) 08/04/2019 1028   RBC 2.88 (L) 08/04/2019 1028   HGB 10.4 (L) 08/04/2019 1028   HGB 11.6 04/14/2016 1326   HCT 30.7 (L) 08/04/2019 1028   HCT 34.2 (L) 04/14/2016 1326   PLT 57 (L) 08/04/2019 1028   PLT 49 (L) 04/14/2016 1326   MCV 106.6 (H) 08/04/2019 1028   MCV 102.4 (H) 04/14/2016 1326   MCH 36.1 (H) 08/04/2019 1028   MCHC 33.9 08/04/2019 1028   RDW 13.2 08/04/2019 1028   RDW 13.5 04/14/2016 1326   LYMPHSABS 0.9 08/01/2019 1430   LYMPHSABS 0.7 (L) 04/14/2016 1326   MONOABS 0.5 08/01/2019 1430   MONOABS 0.3 04/14/2016 1326   EOSABS 0.1 08/01/2019 1430   EOSABS 0.1 04/14/2016 1326   BASOSABS 0.0 08/01/2019 1430   BASOSABS 0.0 04/14/2016 1326   CMP Latest Ref Rng & Units 08/04/2019 08/01/2019 06/21/2019  Glucose 70 - 99 mg/dL 118(H) 109(H) 112(H)  BUN 6 - 20 mg/dL 15 13 16  Creatinine   0.44 - 1.00 mg/dL 1.32(H) 1.16(H) 1.10(H)  Sodium 135 - 145 mmol/L 135 135 138  Potassium 3.5 - 5.1 mmol/L 4.0 4.1 4.0  Chloride 98 - 111 mmol/L 102 101 104  CO2 22 - 32 mmol/L _0 Calcium 8.9 - 10.3 mg/dL 8.7(L) 9.3 9.3  Total Protein 6.5 - 8.1 g/dL - 7.7 7.2  Total Bilirubin 0.3 - 1.2 mg/dL - 2.0(H) 1.9(H)  Alkaline Phos 38 - 126 U/L - 105 -  AST 15 - 41 U/L - 49(H) 41(H)  ALT 0 - 44 U/L - 22 20       ASSESSMENT & PLAN:   Elevated ferritin 1.  Elevated ferritin levels: - This patient has a history of  hepatitis C and cirrhosis. - Alcohol abuse, quit completely 8 years ago. - Labs on 06/21/2019 shows ferritin of 836 and percent saturation of 93. - Elevated ferritin in the past from 2012, in the range of 400-680. - CT [facility] on 07/06/2019 shows nodular contour of the liver with cirrhosis.  Spleen is normal.  Significant varices present.  No significant ascites.  No adenopathy. - No family history of hemochromatosis or malignancies. - Hemochromatosis labs were negative.  Elevated ferritin levels most likely secondary to liver disease.  2.  Pancytopenia: -Labs on 06/21/2019 shows white count of 1.6 with ANC of 890.  Platelet count is 47. -CT on 07/06/2019 shows normal spleen.  Does not report any recurrent infections.  Does not report any fevers, night sweats or weight loss. -Labs from August 04, 2019 are still consistent with pancytopenia with white blood cell count of 2.6, hemoglobin of 10.4, platelet count of 57,000.  No differential was done.  Differential diagnosis includes hypersplenism, bone marrow infiltrate or failure.  Have recommended patient proceed with bone marrow biopsy and aspirate to determine underlying etiology.   3.  Smoking history: -Current active smoker, smokes half pack per day for the past 36 years.  Will recommend low-dose CT screening at her next visit.  4.  Avascular necrosis of left femoral head.   - Patient is scheduled for hip replacement on September 1.  Would advise patient complete work-up for pancytopenia before undergoing hip arthroplasty.      Orders placed this encounter:  Orders Placed This Encounter  Procedures  . CBC with Differential  . Comprehensive metabolic panel  . Methylmalonic acid, serum      Roger Shelter, [place] [phone]

## 2019-08-12 NOTE — H&P (Signed)
TOTAL HIP ADMISSION H&P  Patient is admitted for left total hip arthroplasty, anterior approach.  Subjective:  Chief Complaint:    Left hip primary OA / pain  HPI: Gabriella Leon, 63 y.o. female, has a history of pain and functional disability in the left hip(s) due to arthritis and patient has failed non-surgical conservative treatments for greater than 12 weeks to include NSAID's and/or analgesics, corticosteriod injections and activity modification.  Onset of symptoms was gradual starting 1 years ago with gradually worsening course since that time.The patient noted no past surgery on the left hip(s).  Patient currently rates pain in the left hip at 10 out of 10 with activity. Patient has night pain, worsening of pain with activity and weight bearing, trendelenberg gait, pain that interfers with activities of daily living and pain with passive range of motion. Patient has evidence of periarticular osteophytes and joint space narrowing by imaging studies. This condition presents safety issues increasing the risk of falls.  There is no current active infection.  Risks, benefits and expectations were discussed with the patient.  Risks including but not limited to the risk of anesthesia, blood clots, nerve damage, blood vessel damage, failure of the prosthesis, infection and up to and including death.  Patient understand the risks, benefits and expectations and wishes to proceed with surgery.   PCP: Neale Burly, MD  D/C Plans:       Home  Post-op Meds:       No Rx given  Tranexamic Acid:      To be given - IV   Decadron:      Is to be given  FYI:      ASA  Norco  DME:   Rx for RW and 3-n-1 sent to [facility]  PT:   [facility]: [facility]    Patient Active Problem List   Diagnosis Date Noted  . PUD (peptic ulcer disease) 06/21/2019  . Left hip pain 12/06/2018  . Chronic pain of left knee 12/06/2018  . Chronic left-sided low back pain with left-sided sciatica 12/06/2018  .  Vitamin D deficiency 04/14/2016  . Elevated ferritin 02/14/2016  . Cervical stenosis of spinal canal 09/28/2015  . Idiopathic thrombocytopenic purpura ([place]) 08/29/2015  . Thrombocytopenia ([place]) 08/29/2015  . Hepatic cirrhosis due to chronic hepatitis C infection ([place]) 04/03/2015  . Esophageal varices ([place]) 09/18/2014  . Folate deficiency 03/28/2014  . Unspecified constipation 03/22/2014  . Other pancytopenia ([place]) 10/27/2013  . Hypersplenism syndrome 09/18/2013  . Hepatitis C, chronic ([place]) 09/14/2013  . Lower back pain 06/16/2013  . ETOH abuse 03/03/2013  . RUQ pain 11/07/2012  . Epigastric pain 02/04/2012  . Cirrhosis ([place]) 11/26/2011  . Abdominal pain 11/26/2011  . Screening for colon cancer 11/26/2011   Past Medical History:  Diagnosis Date  . Acid reflux disease with ulcer   . Anxiety   . Arthritis   . Asthma   . Blood dyscrasia    thrombocytopenia-next tx injection 09/25/15  . Blood transfusion   . Chronic kidney disease   . Cirrhosis ([place])   . DDD (degenerative disc disease), lumbar   . Depression   . Diabetes mellitus   . Esophageal varices ([place]) 09/18/2014  . Folate deficiency 03/28/2014   Noted on 03/23/2014.  Folate 1 mg prescribed.  . Gallstones   . GERD (gastroesophageal reflux disease)   . Headache    migraines  . Helicobacter pylori ab+    July 2010, s/p treatment  . Hepatitis B antibody  positive   . Hepatitis C    and B positive antibody  . History of alcoholism ([place])   . History of kidney stones   . Hypertension   . Left leg pain   . Liver cirrhosis ([place])   . Low back pain   . Other pancytopenia ([place]) 10/27/2013  . Ulcer    ?    Past Surgical History:  Procedure Laterality Date  . ANTERIOR CERVICAL DECOMP/DISCECTOMY FUSION N/A 09/28/2015   Procedure: Cervical three-four Anterior cervical decompression/diskectomy/fusion;  Surgeon: Leeroy Cha, MD;  Location: [facility];  Service: Neurosurgery;  Laterality: N/A;  C3-4 Anterior cervical  decompression/diskectomy/fusion  . APPENDECTOMY     age 54  . BIOPSY  12/18/2011      . BIOPSY  05/19/2019   Procedure: BIOPSY;  Surgeon: Daneil Dolin, MD;  Location: [facility];  Service: Endoscopy;;  gastric  . [place]  greater than 10 yrs   Dent  . COLONOSCOPY  12/18/11   minimal anal canal hemorrhoids, friable rectal and colonic mucosa, left-sided diverticulosis, repeat in 2022.   . ESOPHAGOGASTRODUODENOSCOPY  12/18/11   3 columns of Grade II esophageal varices, reflux esohpagitis, portal gastropathy, path with chronic gastritis, negative H.pylori, surveillance in June 2014  . ESOPHAGOGASTRODUODENOSCOPY (EGD) WITH PROPOFOL N/A 04/13/2014   Dr. [name]: grade 1-2 varices, hiatal hernia, portal gastropathy. NO need for further surveillance while on prophylaxis unless evidence of bleeding  . ESOPHAGOGASTRODUODENOSCOPY (EGD) WITH PROPOFOL N/A 05/19/2019   erosive reflux esophagitis, Grade 1-2 esophageal varices with patulous EG junction. Hiatal hernia. Portal gastropathy. Gastric and duodenal dulcer disease.   . MULTIPLE EXTRACTIONS WITH ALVEOLOPLASTY  03/08/2012   Procedure: MULTIPLE EXTRACION WITH ALVEOLOPLASTY;  Surgeon: Gae Bon, DDS;  Location: [facility];  Service: Oral Surgery;  Laterality: N/A;    No current facility-administered medications for this encounter.    Current Outpatient Medications  Medication Sig Dispense Refill Last Dose  . famotidine (PEPCID) 20 MG tablet Take 20 mg by mouth 2 (two) times daily.      . furosemide (LASIX) 40 MG tablet Take 40 mg by mouth daily.      [name] glimepiride (AMARYL) 2 MG tablet Take 2 mg by mouth daily as needed (if blood sugar is >160).      [name] HYDROcodone-acetaminophen (NORCO/VICODIN) 5-325 MG per tablet Take 1 tablet by mouth every 6 (six) hours as needed for moderate pain.     [name] lisinopril-hydrochlorothiazide (PRINZIDE,ZESTORETIC) 10-12.5 MG per tablet Take 1 tablet by mouth daily.     .  metFORMIN (GLUCOPHAGE) 500 MG tablet Take 500 mg by mouth daily with breakfast.      . nadolol (CORGARD) 40 MG tablet Take 1 tablet by mouth daily.      . pantoprazole (PROTONIX) 40 MG tablet Take 1 tablet by mouth 2 (two) times a day.     . spironolactone (ALDACTONE) 25 MG tablet Take 1 tablet (25 mg total) by mouth daily. 60 tablet 1    Allergies  Allergen Reactions  . Other     Social History   Tobacco Use  . Smoking status: Current Every Day Smoker    Packs/day: 0.50    Years: 20.00    Pack years: 10.00    Types: Cigarettes  . Smokeless tobacco: Never Used  Substance Use Topics  . Alcohol use: No    Comment: last use 2015    Family History  Problem Relation Age of  Onset  . Diabetes Mother   . Cancer Paternal Uncle        passed away age 73  . Other Father        died in [place] in 22  . Anesthesia problems Neg Hx   . Hypotension Neg Hx   . Malignant hyperthermia Neg Hx   . Pseudochol deficiency Neg Hx   . Colon cancer Neg Hx   . Liver disease Neg Hx      Review of Systems  Constitutional: Positive for malaise/fatigue.  HENT: Negative.   Eyes: Negative.   Respiratory: Negative.   Cardiovascular: Negative.   Gastrointestinal: Negative.   Genitourinary: Negative.   Musculoskeletal: Positive for joint pain.  Skin: Negative.   Neurological: Positive for dizziness and headaches.  Endo/Heme/Allergies: Negative.   Psychiatric/Behavioral: Negative.     Objective:  Physical Exam  Constitutional: She is oriented to person, place, and time. She appears well-developed.  HENT:  Head: Normocephalic.  Eyes: Pupils are equal, round, and reactive to light.  Neck: Neck supple. No JVD present. No tracheal deviation present. No thyromegaly present.  Cardiovascular: Normal rate, regular rhythm and intact distal pulses.  Respiratory: Effort normal and breath sounds normal. No respiratory distress. She has no wheezes.  GI: Soft. There is no abdominal tenderness. There is no  guarding.  Musculoskeletal:     Left hip: She exhibits decreased range of motion, decreased strength, tenderness and bony tenderness. She exhibits no swelling, no deformity and no laceration.  Lymphadenopathy:    She has no cervical adenopathy.  Neurological: She is alert and oriented to person, place, and time. A sensory deficit (neuropathy in bilateral feet) is present.  Skin: Skin is warm and dry.  Psychiatric: She has a normal mood and affect.      Labs:  Estimated body mass index is 28.67 kg/m as calculated from the following:   Height as of 08/04/19: 5\' 4"  (1.626 m).   Weight as of 08/11/19: 75.8 kg.   Imaging Review Plain radiographs demonstrate severe degenerative joint disease of the left hip(s). The bone quality appears to be good for age and reported activity level.      Assessment/Plan:  End stage arthritis, left hip(s)  The patient history, physical examination, clinical judgement of the provider and imaging studies are consistent with end stage degenerative joint disease of the left hip(s) and total hip arthroplasty is deemed medically necessary. The treatment options including medical management, injection therapy, arthroscopy and arthroplasty were discussed at length. The risks and benefits of total hip arthroplasty were presented and reviewed. The risks due to aseptic loosening, infection, stiffness, dislocation/subluxation,  thromboembolic complications and other imponderables were discussed.  The patient acknowledged the explanation, agreed to proceed with the plan and consent was signed. Patient is being admitted for inpatient treatment for surgery, pain control, PT, OT, prophylactic antibiotics, VTE prophylaxis, progressive ambulation and ADL's and discharge planning.The patient is planning to be discharged home.    [place] Julienne Vogler   PA-C  08/12/2019, 4:33 PM

## 2019-08-15 NOTE — Patient Instructions (Signed)
YOU NEED TO HAVE A COVID 19 TEST ON_friday 8/28______ @_______ , THIS TEST MUST BE DONE BEFORE SURGERY, COME  [place], [place] , [address]. ONCE YOUR COVID TEST IS COMPLETED, PLEASE BEGIN THE QUARANTINE INSTRUCTIONS AS OUTLINED IN YOUR HANDOUT.                Gabriella Leon    Your procedure is scheduled on: Tuesday 08/23/19   Report to [facility] Main  Entrance  Report to admitting at  1:10 PM AM   1 VISITOR IS ALLOWED TO WAIT IN WAITING ROOM  ONLY DAY OF YOUR SURGERY . NO VISITORS ARE ALLOWED IN SHORT STAY OR RECOVERY ROOM.   Call this number if you have problems the morning of surgery [place] AND RINSE YOUR MOUTH OUT, NO CHEWING GUM CANDY OR MINTS.   Do not eat food After Midnight.   YOU MAY HAVE CLEAR LIQUIDS FROM MIDNIGHT UNTILAM. 12:10 PM   Please finish the prescribed Pre-Surgery Gatorade drink. Nothing by mouth after you finish the Gatorade drink !    CLEAR LIQUID DIET   Foods Allowed                                                                     Foods Excluded  Coffee and tea, regular and decaf                             liquids that you cannot  Plain Jell-O any favor except red or purple                                           see through such as: Fruit ices (not with fruit pulp)                                     milk, soups, orange juice  Iced Popsicles                                    All solid food Carbonated beverages, regular and diet                                    Cranberry, grape and apple juices Sports drinks like Gatorade Lightly seasoned clear broth or consume(fat free) Sugar, honey syrup  Sample Menu Breakfast                                Lunch                                     Supper Cranberry juice  Beef broth                            Chicken broth Jell-O                                     Grape juice                           Apple  juice Coffee or tea                        Jell-O                                      Popsicle                                                Coffee or tea                        Coffee or tea  _____________________________________________________________________    Take these medicines the morning of surgery with A SIP OF WATER: Nadolol, Protonix  DO NOT TAKE ANY DIABETIC MEDICATIONS DAY OF YOUR SURGERY                               You may not have any metal on your body including hair pins and              piercings  Do not wear jewelry, make-up, lotions, powders or perfumes, deodorant             Do not wear nail polish.  Do not shave  48 hours prior to surgery.               Do not bring valuables to the hospital. [place].  Contacts, dentures or bridgework may not be worn into surgery.       Special Instructions: N/A              Please read over the following fact sheets you were given: _____________________________________________________________________             [facility] - Preparing for Surgery Before surgery, you can play an important role.   Because skin is not sterile, your skin needs to be as free of germs as possible   You can reduce the number of germs on your skin by washing with CHG (chlorahexidine gluconate) soap before surgery.   CHG is an antiseptic cleaner which kills germs and bonds with the skin to continue killing germs even after washing. Please DO NOT use if you have an allergy to CHG or antibacterial soaps .  If your skin becomes reddened/irritated stop using the CHG and inform your nurse when you arrive at Short Stay. Do not shave (including legs and underarms) for at least 48 hours prior to the first CHG shower . Please follow these instructions carefully:  1.  Shower with CHG Soap the night before surgery and the  morning of Surgery.  2.  If you choose to wash your hair, wash your hair first  as usual with your  normal  shampoo.  3.  After you shampoo, rinse your hair and body thoroughly to remove the  shampoo.                                       4.  Use CHG as you would any other liquid soap.  You can apply chg directly  to the skin and wash                       Gently with a scrungie or clean washcloth.  5.  Apply the CHG Soap to your body ONLY FROM THE NECK DOWN.   Do not use on face/ open                           Wound or open sores. Avoid contact with eyes, ears mouth and genitals (private parts).                       Wash face,  Genitals (private parts) with your normal soap.             6.  Wash thoroughly, paying special attention to the area where your surgery  will be performed.  7.  Thoroughly rinse your body with warm water from the neck down.  8.  DO NOT shower/wash with your normal soap after using and rinsing off  the CHG Soap.                9.  Pat yourself dry with a clean towel.            10.  Wear clean pajamas.            11.  Place clean sheets on your bed the night of your first shower and do not  sleep with pets. Day of Surgery : Do not apply any lotions/deodorants the morning of surgery.  Please wear clean clothes to the hospital/surgery center.  FAILURE TO FOLLOW THESE INSTRUCTIONS MAY RESULT IN THE CANCELLATION OF YOUR SURGERY PATIENT SIGNATURE_________________________________  NURSE SIGNATURE__________________________________  ________________________________________________________________________   Adam Phenix  An incentive spirometer is a tool that can help keep your lungs clear and active. This tool measures how well you are filling your lungs with each breath. Taking long deep breaths may help reverse or decrease the chance of developing breathing (pulmonary) problems (especially infection) following:  A long period of time when you are unable to move or be active. BEFORE THE PROCEDURE   If the spirometer includes an indicator  to show your best effort, your nurse or respiratory therapist will set it to a desired goal.  If possible, sit up straight or lean slightly forward. Try not to slouch.  Hold the incentive spirometer in an upright position. INSTRUCTIONS FOR USE  1. Sit on the edge of your bed if possible, or sit up as far as you can in bed or on a chair. 2. Hold the incentive spirometer in an upright position. 3. Breathe out normally. 4. Place the mouthpiece in your mouth and seal your lips tightly around it. 5. Breathe in slowly and as deeply as possible, raising  the piston or the ball toward the top of the column. 6. Hold your breath for 3-5 seconds or for as long as possible. Allow the piston or ball to fall to the bottom of the column. 7. Remove the mouthpiece from your mouth and breathe out normally. 8. Rest for a few seconds and repeat Steps 1 through 7 at least 10 times every 1-2 hours when you are awake. Take your time and take a few normal breaths between deep breaths. 9. The spirometer may include an indicator to show your best effort. Use the indicator as a goal to work toward during each repetition. 10. After each set of 10 deep breaths, practice coughing to be sure your lungs are clear. If you have an incision (the cut made at the time of surgery), support your incision when coughing by placing a pillow or rolled up towels firmly against it. Once you are able to get out of bed, walk around indoors and cough well. You may stop using the incentive spirometer when instructed by your caregiver.  RISKS AND COMPLICATIONS  Take your time so you do not get dizzy or light-headed.  If you are in pain, you may need to take or ask for pain medication before doing incentive spirometry. It is harder to take a deep breath if you are having pain. AFTER USE  Rest and breathe slowly and easily.  It can be helpful to keep track of a log of your progress. Your caregiver can provide you with a simple table to help  with this. If you are using the spirometer at home, follow these instructions: [place] IF:   You are having difficultly using the spirometer.  You have trouble using the spirometer as often as instructed.  Your pain medication is not giving enough relief while using the spirometer.  You develop fever of 100.5 F (38.1 C) or higher. SEEK IMMEDIATE MEDICAL CARE IF:   You cough up bloody sputum that had not been present before.  You develop fever of 102 F (38.9 C) or greater.  You develop worsening pain at or near the incision site. MAKE SURE YOU:   Understand these instructions.  Will watch your condition.  Will get help right away if you are not doing well or get worse. Document Released: 04/20/2007 Document Revised: 03/01/2012 Document Reviewed: 06/21/2007 [facility] Patient Information 2014 [place], [place].   ________________________________________________________________________

## 2019-08-16 ENCOUNTER — Encounter (HOSPITAL_COMMUNITY)
Admission: RE | Admit: 2019-08-16 | Discharge: 2019-08-16 | Disposition: A | Discharge status: Home / Self Care | Payer: Medicare Other | Source: Ambulatory Visit | Attending: Internal Medicine | Admitting: Internal Medicine

## 2019-08-16 NOTE — Progress Notes (Addendum)
Patient was a no show at the PAT visit today 8/25 at 13:00. RN called pt and she reported the the surgery was"put off until the platelets are better" per Dr. Alvan Dame. Message left with Scheduler with [facility] that handles Dr. Aurea Graff surgeries.

## 2019-08-23 ENCOUNTER — Inpatient Hospital Stay (HOSPITAL_COMMUNITY): Admission: RE | Admit: 2019-08-23 | Payer: Medicare Other | Source: Home / Self Care | Admitting: Orthopedic Surgery

## 2019-08-23 ENCOUNTER — Encounter (HOSPITAL_COMMUNITY): Admission: RE | Payer: Self-pay | Source: Home / Self Care

## 2019-08-23 SURGERY — ARTHROPLASTY, HIP, TOTAL, ANTERIOR APPROACH
Anesthesia: Spinal | Laterality: Left

## 2019-08-30 ENCOUNTER — Ambulatory Visit (INDEPENDENT_AMBULATORY_CARE_PROVIDER_SITE_OTHER): Payer: Medicare Other | Admitting: Gastroenterology

## 2019-08-30 ENCOUNTER — Other Ambulatory Visit: Payer: Self-pay

## 2019-08-30 ENCOUNTER — Other Ambulatory Visit: Payer: Self-pay | Admitting: *Deleted

## 2019-08-30 ENCOUNTER — Encounter: Payer: Self-pay | Admitting: Gastroenterology

## 2019-08-30 VITALS — BP 139/71 | HR 54 | Temp 96.2°F | Ht 64.0 in | Wt 169.8 lb

## 2019-08-30 DIAGNOSIS — K7460 Unspecified cirrhosis of liver: Secondary | ICD-10-CM

## 2019-08-30 DIAGNOSIS — K279 Peptic ulcer, site unspecified, unspecified as acute or chronic, without hemorrhage or perforation: Secondary | ICD-10-CM | POA: Diagnosis not present

## 2019-08-30 DIAGNOSIS — R079 Chest pain, unspecified: Secondary | ICD-10-CM

## 2019-08-30 DIAGNOSIS — R188 Other ascites: Secondary | ICD-10-CM

## 2019-08-30 NOTE — Progress Notes (Signed)
Referring Provider: Neale Burly, MD Primary Care Physician:  Neale Burly, MD  Primary GI: Dr. [name]   Chief Complaint  Patient presents with   Cirrhosis    HPI:   Gabriella Leon is a 63 y.o. female presenting today with a history of  history of Hepatitis C cirrhosis, completing treatment in June 2016 with known SVR. Followed by Hematology in the past due to elevated ferritin, felt to be secondary to known liver disease. She was recently seen by Hematology.  EGD completed in May 2020 with erosive reflux esophagitis, Grade 1-2 esophageal varices with patulous EG junction. Hiatal hernia. Portal gastropathy. Gastric and duodenal dulcer disease. Negative H.pylori. She was started on Nadolol 40 mg once daily. EGD for surveillance had been planned but she developed chest pain that day and was cancelled.    Hgb has been drifting down, now in the 10 range. Previously 11. Ferritin elevated (836) and iron sats 93. Hemachromatosis mutation negative. CT July 2020 with varices, no significant ascites, no focal liver mass. Creatinine has been slowly worsening, 1.32 about a month ago. GFR 51.   Diuretics: lasix 40 mg and aldactone 25 mg daily. Occasional trace lower extremity edema. No abdominal distension.   Feels tired all the time. No dizziness. Nadolol 40 mg daily. No NSAIDs currently. Protonix BID.   Chest pain located above sternum intermittently. Worsened by exertion. Associated shortness of breath.   No mental status changes, confusion, jaundice.  Past Medical History:  Diagnosis Date   Acid reflux disease with ulcer    Anxiety    Arthritis    Asthma    Blood dyscrasia    thrombocytopenia-next tx injection 09/25/15   Blood transfusion    Chronic kidney disease    Cirrhosis ([place])    DDD (degenerative disc disease), lumbar    Depression    Diabetes mellitus    Esophageal varices ([place]) 09/18/2014   Folate deficiency 03/28/2014   Noted on 03/23/2014.  Folate 1  mg prescribed.   Gallstones    GERD (gastroesophageal reflux disease)    Headache    migraines   Helicobacter pylori ab+    July 2010, s/p treatment   Hepatitis B antibody positive    Hepatitis C    and B positive antibody   History of alcoholism ([place])    History of kidney stones    Hypertension    Left leg pain    Liver cirrhosis ([facility])    Low back pain    Other pancytopenia ([place]) 10/27/2013   Ulcer    ?    Past Surgical History:  Procedure Laterality Date   ANTERIOR CERVICAL DECOMP/DISCECTOMY FUSION N/A 09/28/2015   Procedure: Cervical three-four Anterior cervical decompression/diskectomy/fusion;  Surgeon: Leeroy Cha, MD;  Location: [facility];  Service: Neurosurgery;  Laterality: N/A;  C3-4 Anterior cervical decompression/diskectomy/fusion   APPENDECTOMY     age 81   BIOPSY  12/18/2011       BIOPSY  05/19/2019   Procedure: BIOPSY;  Surgeon: Daneil Dolin, MD;  [facility];  Service: Endoscopy;;  gastric   [place]  greater than 10 yrs   [place]   COLONOSCOPY  12/18/11   minimal anal canal hemorrhoids, friable rectal and colonic mucosa, left-sided diverticulosis, repeat in 2022.    ESOPHAGOGASTRODUODENOSCOPY  12/18/11   3 columns of Grade II esophageal varices, reflux esohpagitis, portal gastropathy, path with chronic gastritis, negative H.pylori, surveillance in  June 2014   ESOPHAGOGASTRODUODENOSCOPY (EGD) WITH PROPOFOL N/A 04/13/2014   Dr. [name]: grade 1-2 varices, hiatal hernia, portal gastropathy. NO need for further surveillance while on prophylaxis unless evidence of bleeding   ESOPHAGOGASTRODUODENOSCOPY (EGD) WITH PROPOFOL N/A 05/19/2019   erosive reflux esophagitis, Grade 1-2 esophageal varices with patulous EG junction. Hiatal hernia. Portal gastropathy. Gastric and duodenal dulcer disease.    MULTIPLE EXTRACTIONS WITH ALVEOLOPLASTY  03/08/2012   Procedure: MULTIPLE EXTRACION WITH  ALVEOLOPLASTY;  Surgeon: Gae Bon, DDS;  Location: [facility];  Service: Oral Surgery;  Laterality: N/A;    Current Outpatient Medications  Medication Sig Dispense Refill   famotidine (PEPCID) 20 MG tablet Take 20 mg by mouth 2 (two) times daily.      furosemide (LASIX) 40 MG tablet Take 40 mg by mouth daily.      glimepiride (AMARYL) 2 MG tablet Take 2 mg by mouth daily as needed (if blood sugar is >160).      HYDROcodone-acetaminophen (NORCO/VICODIN) 5-325 MG per tablet Take 1 tablet by mouth every 6 (six) hours as needed for moderate pain.     lisinopril-hydrochlorothiazide (PRINZIDE,ZESTORETIC) 10-12.5 MG per tablet Take 1 tablet by mouth daily.     metFORMIN (GLUCOPHAGE) 500 MG tablet Take 500 mg by mouth daily with breakfast.      nadolol (CORGARD) 40 MG tablet Take 1 tablet by mouth daily.      pantoprazole (PROTONIX) 40 MG tablet Take 1 tablet by mouth 2 (two) times a day.     spironolactone (ALDACTONE) 25 MG tablet Take 1 tablet (25 mg total) by mouth daily. 60 tablet 1   No current facility-administered medications for this visit.     Allergies as of 08/30/2019 - Review Complete 08/30/2019  Allergen Reaction Noted   Other  08/11/2019    Family History  Problem Relation Age of Onset   Diabetes Mother    Cancer Paternal Uncle        passed away age 45   Other Father        died in MVA in April 05, 1989   Anesthesia problems Neg Hx    Hypotension Neg Hx    Malignant hyperthermia Neg Hx    Pseudochol deficiency Neg Hx    Colon cancer Neg Hx    Liver disease Neg Hx     Social History   Socioeconomic History   Marital status: Legally Separated    Spouse name: Not on file   Number of children: 1   Years of education: 10th grade   Highest education level: Not on file  Occupational History   Occupation: disabled    Fish farm manager: NOT EMPLOYED  Social Designer, fashion/clothing strain: Not on file   Food insecurity    Worry: Not on file    Inability:  Not on file   Transportation needs    Medical: Not on file    Non-medical: Not on file  Tobacco Use   Smoking status: Current Every Day Smoker    Packs/day: 0.50    Years: 20.00    Pack years: 10.00    Types: Cigarettes   Smokeless tobacco: Never Used  Substance and Sexual Activity   Alcohol use: No    Comment: last use 04-05-2014   Drug use: No   Sexual activity: Yes    Birth control/protection: Post-menopausal  Lifestyle   Physical activity    Days per week: Not on file    Minutes per session: Not on file   Stress:  Not on file  Relationships   Social connections    Talks on phone: Not on file    Gets together: Not on file    Attends religious service: Not on file    Active member of club or organization: Not on file    Attends meetings of clubs or organizations: Not on file    Relationship status: Not on file  Other Topics Concern   Not on file  Social History Narrative   Lives at home alone.   Right-handed.   Occasional caffeine use.    Review of Systems: Gen: see HPI CV: see HPI Resp: Denies dyspnea at rest, cough, wheezing, coughing up blood, and pleurisy. GI: see HPI Derm: Denies rash, itching, dry skin Psych: Denies depression, anxiety, memory loss, confusion. No homicidal or suicidal ideation.  Heme: Denies bruising, bleeding, and enlarged lymph nodes.  Physical Exam: BP 139/71    Pulse (!) 54    Temp (!) 96.2 F (35.7 C) (Temporal)    Ht 5\' 4"  (1.626 m)    Wt 169 lb 12.8 oz (77 kg)    BMI 29.15 kg/m  General:   Alert and oriented. No distress noted. Pleasant and cooperative.  Head:  Normocephalic and atraumatic. Eyes:  Conjuctiva clear without scleral icterus. Abdomen:  +BS, soft, non-tender and non-distended. No rebound or guarding. No HSM or masses noted. Msk:  Symmetrical without gross deformities. Normal posture. Extremities:  Without edema. Neurologic:  Alert and  oriented x4 Psych:  Alert and cooperative. Normal mood and affect.

## 2019-08-30 NOTE — Patient Instructions (Addendum)
Please have blood work done today.   For now, I would like for you to stop Lasix and aldactone until after review of blood work. We may be stopping Nadolol, but continue this for now until you hear from [place] after your blood work today.   I am referring you to a cardiologist due to chest pain.  Once you are cleared, we can pursue the upper endoscopy with Dr. [name].  Please follow a low salt diet as attached. Don't add any extra salt to food. This will help keep you from swelling in your legs and abdomen.   I would like to see you in 2 months for close follow-up.  I enjoyed seeing you again today! As you know, I value our relationship and want to provide genuine, compassionate, and quality care. I welcome your feedback. If you receive a survey regarding your visit,  I greatly appreciate you taking time to fill this out. See you next time!  Annitta Needs, PhD, ANP-BC [facility]   Low-Sodium Eating Plan Sodium, which is an element that makes up salt, helps you maintain a healthy balance of fluids in your body. Too much sodium can increase your blood pressure and cause fluid and waste to be held in your body. Your health care provider or dietitian may recommend following this plan if you have high blood pressure (hypertension), kidney disease, liver disease, or heart failure. Eating less sodium can help lower your blood pressure, reduce swelling, and protect your heart, liver, and kidneys. What are tips for following this plan? General guidelines  Most people on this plan should limit their sodium intake to 1,500-2,000 mg (milligrams) of sodium each day. Reading food labels   The Nutrition Facts label lists the amount of sodium in one serving of the food. If you eat more than one serving, you must multiply the listed amount of sodium by the number of servings.  Choose foods with less than 140 mg of sodium per serving.  Avoid foods with 300 mg of sodium or more per  serving. Shopping  Look for lower-sodium products, often labeled as "low-sodium" or "no salt added."  Always check the sodium content even if foods are labeled as "unsalted" or "no salt added".  Buy fresh foods. ? Avoid canned foods and premade or frozen meals. ? Avoid canned, cured, or processed meats  Buy breads that have less than 80 mg of sodium per slice. Cooking  Eat more home-cooked food and less restaurant, buffet, and fast food.  Avoid adding salt when cooking. Use salt-free seasonings or herbs instead of table salt or sea salt. Check with your health care provider or pharmacist before using salt substitutes.  Cook with plant-based oils, such as canola, sunflower, or olive oil. Meal planning  When eating at a restaurant, ask that your food be prepared with less salt or no salt, if possible.  Avoid foods that contain MSG (monosodium glutamate). MSG is sometimes added to [place] food, bouillon, and some canned foods. What foods are recommended? The items listed may not be a complete list. Talk with your dietitian about what dietary choices are best for you. Grains Low-sodium cereals, including oats, puffed wheat and rice, and shredded wheat. Low-sodium crackers. Unsalted rice. Unsalted pasta. Low-sodium bread. Whole-grain breads and whole-grain pasta. Vegetables Fresh or frozen vegetables. "No salt added" canned vegetables. "No salt added" tomato sauce and paste. Low-sodium or reduced-sodium tomato and vegetable juice. Fruits Fresh, frozen, or canned fruit. Fruit juice. Meats and other protein  foods Fresh or frozen (no salt added) meat, poultry, seafood, and fish. Low-sodium canned tuna and salmon. Unsalted nuts. Dried peas, beans, and lentils without added salt. Unsalted canned beans. Eggs. Unsalted nut butters. Dairy Milk. Soy milk. Cheese that is naturally low in sodium, such as ricotta cheese, fresh mozzarella, or Swiss cheese Low-sodium or reduced-sodium cheese. Cream  cheese. Yogurt. Fats and oils Unsalted butter. Unsalted margarine with no trans fat. Vegetable oils such as canola or olive oils. Seasonings and other foods Fresh and dried herbs and spices. Salt-free seasonings. Low-sodium mustard and ketchup. Sodium-free salad dressing. Sodium-free light mayonnaise. Fresh or refrigerated horseradish. Lemon juice. Vinegar. Homemade, reduced-sodium, or low-sodium soups. Unsalted popcorn and pretzels. Low-salt or salt-free chips. What foods are not recommended? The items listed may not be a complete list. Talk with your dietitian about what dietary choices are best for you. Grains Instant hot cereals. Bread stuffing, pancake, and biscuit mixes. Croutons. Seasoned rice or pasta mixes. Noodle soup cups. Boxed or frozen macaroni and cheese. Regular salted crackers. Self-rising flour. Vegetables Sauerkraut, pickled vegetables, and relishes. Olives. [place] fries. Onion rings. Regular canned vegetables (not low-sodium or reduced-sodium). Regular canned tomato sauce and paste (not low-sodium or reduced-sodium). Regular tomato and vegetable juice (not low-sodium or reduced-sodium). Frozen vegetables in sauces. Meats and other protein foods Meat or fish that is salted, canned, smoked, spiced, or pickled. Bacon, ham, sausage, hotdogs, corned beef, chipped beef, packaged lunch meats, salt pork, jerky, pickled herring, anchovies, regular canned tuna, sardines, salted nuts. Dairy Processed cheese and cheese spreads. Cheese curds. Blue cheese. Feta cheese. String cheese. Regular cottage cheese. Buttermilk. Canned milk. Fats and oils Salted butter. Regular margarine. Ghee. Bacon fat. Seasonings and other foods Onion salt, garlic salt, seasoned salt, table salt, and sea salt. Canned and packaged gravies. Worcestershire sauce. Tartar sauce. Barbecue sauce. Teriyaki sauce. Soy sauce, including reduced-sodium. Steak sauce. Fish sauce. Oyster sauce. Cocktail sauce. Horseradish that you  find on the shelf. Regular ketchup and mustard. Meat flavorings and tenderizers. Bouillon cubes. Hot sauce and Tabasco sauce. Premade or packaged marinades. Premade or packaged taco seasonings. Relishes. Regular salad dressings. Salsa. Potato and tortilla chips. Corn chips and puffs. Salted popcorn and pretzels. Canned or dried soups. Pizza. Frozen entrees and pot pies. Summary  Eating less sodium can help lower your blood pressure, reduce swelling, and protect your heart, liver, and kidneys.  Most people on this plan should limit their sodium intake to 1,500-2,000 mg (milligrams) of sodium each day.  Canned, boxed, and frozen foods are high in sodium. Restaurant foods, fast foods, and pizza are also very high in sodium. You also get sodium by adding salt to food.  Try to cook at home, eat more fresh fruits and vegetables, and eat less fast food, canned, processed, or prepared foods. This information is not intended to replace advice given to you by your health care provider. Make sure you discuss any questions you have with your health care provider. Document Released: 05/30/2002 Document Revised: 11/20/2017 Document Reviewed: 12/01/2016 Elsevier Patient Education  2020 [employer].

## 2019-08-31 ENCOUNTER — Inpatient Hospital Stay (HOSPITAL_COMMUNITY): Payer: Medicare Other | Attending: Hematology

## 2019-08-31 ENCOUNTER — Inpatient Hospital Stay (HOSPITAL_COMMUNITY): Payer: Medicare Other

## 2019-08-31 ENCOUNTER — Other Ambulatory Visit: Payer: Self-pay

## 2019-08-31 ENCOUNTER — Inpatient Hospital Stay (HOSPITAL_COMMUNITY): Payer: Medicare Other | Attending: Hematology | Admitting: Hematology

## 2019-08-31 ENCOUNTER — Encounter (HOSPITAL_COMMUNITY): Payer: Self-pay | Admitting: Hematology

## 2019-08-31 DIAGNOSIS — R7989 Other specified abnormal findings of blood chemistry: Secondary | ICD-10-CM | POA: Diagnosis not present

## 2019-08-31 DIAGNOSIS — D61818 Other pancytopenia: Secondary | ICD-10-CM | POA: Insufficient documentation

## 2019-08-31 LAB — CBC WITH DIFFERENTIAL/PLATELET
Band Neutrophils: 0 %
Basophils Absolute: 0 10*3/uL (ref 0.0–0.1)
Basophils Relative: 1 %
Blasts: 0 %
Eosinophils Absolute: 0.1 10*3/uL (ref 0.0–0.5)
Eosinophils Relative: 5 %
HCT: 29.5 % — ABNORMAL LOW (ref 36.0–46.0)
Hemoglobin: 9.8 g/dL — ABNORMAL LOW (ref 12.0–15.0)
Lymphocytes Relative: 44 %
Lymphs Abs: 1 10*3/uL (ref 0.7–4.0)
MCH: 36.4 pg — ABNORMAL HIGH (ref 26.0–34.0)
MCHC: 33.2 g/dL (ref 30.0–36.0)
MCV: 109.7 fL — ABNORMAL HIGH (ref 80.0–100.0)
Metamyelocytes Relative: 0 %
Monocytes Absolute: 0.3 10*3/uL (ref 0.1–1.0)
Monocytes Relative: 13 %
Myelocytes: 0 %
Neutro Abs: 0.8 10*3/uL — ABNORMAL LOW (ref 1.7–7.7)
Neutrophils Relative %: 37 %
Other: 0 %
Platelets: 56 10*3/uL — ABNORMAL LOW (ref 150–400)
Promyelocytes Relative: 0 %
RBC: 2.69 MIL/uL — ABNORMAL LOW (ref 3.87–5.11)
RDW: 12.8 % (ref 11.5–15.5)
WBC: 2.2 10*3/uL — ABNORMAL LOW (ref 4.0–10.5)
nRBC: 0 /100 WBC

## 2019-08-31 MED ORDER — LIDOCAINE HCL (PF) 1 % IJ SOLN
INTRAMUSCULAR | Status: AC
  Filled 2019-08-31: qty 10

## 2019-08-31 NOTE — Progress Notes (Signed)
INDICATION:    Bone Marrow Biopsy and Aspiration Procedure Note   The patient was identified by name and date of birth, prior to start of the procedure and a timeout was performed.   An informed consent was obtained after discussing potential risks including bleeding, infection and pain.  The right posterior iliac crest was palpated, cleaned with ChloraPrep, and drapes applied.  1% lidocaine is infiltrated into the skin, subcutaneous tissue and periosteum.  Bone marrow was aspirated and smears made.  With the help of Jamshidi needle a core biopsy was obtained.  Pressure was applied to the biopsy site and bandage was placed over the biopsy site. Patient was made to lie on the back for 15 mins prior to discharge.  The procedure was tolerated well. COMPLICATIONS: None BLOOD LOSS: none Patient was discharged home in stable condition to return in 2 weeks to review results.  Patient was provided with post bone marrow biopsy instructions and instructed to call if there was any bleeding or worsening pain.  Specimens sent for flow cytometry, cytogenetics and additional studies.  Signed Derek Jack, MD

## 2019-08-31 NOTE — Assessment & Plan Note (Signed)
1.  Elevated ferritin levels: - This patient has a history of hepatitis C and cirrhosis. - Alcohol abuse, quit completely 8 years ago. - Labs on 06/21/2019 shows ferritin of 836 and percent saturation of 93. - Elevated ferritin in the past from 2012, in the range of 400-680. - CT [facility] on 07/06/2019 shows nodular contour of the liver with cirrhosis.  Spleen is normal.  Significant varices present.  No significant ascites.  No adenopathy. - Hemochromatosis labs were negative.  Elevated ferritin levels likely from liver disease and hep C.  2.  Pancytopenia: -Labs on 06/21/2019 shows white count of 1.6 with ANC of 890.  Platelet count is 47. -CT on 07/06/2019 shows normal spleen.  Does not report any recurrent infections.  Does not report any fevers, night sweats or weight loss. -Labs from August 04, 2019 are still consistent with pancytopenia with white blood cell count of 2.6, hemoglobin of 10.4, platelet count of 57,000.  No differential was done. -Differential diagnosis includes hypersplenism versus bone marrow infiltrative process.  I have recommended bone marrow aspiration biopsy to evaluate this.  We talked about rare complications including bleeding and infection.  We will proceed with bone marrow biopsy today.   3.  Smoking history: -Current active smoker, smoked half pack per day for 36 years. -We will consider low-dose CT scan for screening.  4.  Avascular necrosis of left femoral head.   -Patient will be scheduled for hip replacement pending work-up for pancytopenia.

## 2019-08-31 NOTE — Patient Instructions (Signed)
[place] at [facility] Discharge Instructions  You were seen today by Dr. Delton Coombes. You had a bone marrow biopsy today. He will see you back in 2 weeks for follow up.   DO NOT REMOVE DRESSING for 24 hours, take it off at 10:00 tomorrow morning.   Bone Marrow Aspiration and Bone Marrow Biopsy, Adult, Care After This sheet gives you information about how to care for yourself after your procedure. Your health care provider may also give you more specific instructions. If you have problems or questions, contact your health care provider. What can I expect after the procedure? After the procedure, it is common to have:  Mild pain and tenderness.  Swelling.  Bruising. Follow these instructions at home: Puncture site care      Follow instructions from your health care provider about how to take care of the puncture site. Make sure you: ? Wash your hands with soap and water before you change your bandage (dressing). If soap and water are not available, use hand sanitizer. ? Change your dressing as told by your health care provider.  Check your puncture siteevery day for signs of infection. Check for: ? More redness, swelling, or pain. ? More fluid or blood. ? Warmth. ? Pus or a bad smell. General instructions  Take over-the-counter and prescription medicines only as told by your health care provider.  Do not take baths, swim, or use a hot tub until your health care provider approves. Ask if you can take a shower or have a sponge bath.  Return to your normal activities as told by your health care provider. Ask your health care provider what activities are safe for you.  Do not drive for 24 hours if you were given a medicine to help you relax (sedative) during your procedure.  Keep all follow-up visits as told by your health care provider. This is important. Contact a health care provider if:  Your pain is not controlled with medicine. Get help right  away if:  You have a fever.  You have more redness, swelling, or pain around the puncture site.  You have more fluid or blood coming from the puncture site.  Your puncture site feels warm to the touch.  You have pus or a bad smell coming from the puncture site. These symptoms may represent a serious problem that is an emergency. Do not wait to see if the symptoms will go away. Get medical help right away. Call your local emergency services (911 in the [place] Do not drive yourself to the hospital. Summary  After the procedure, it is common to have mild pain, tenderness, swelling, and bruising.  Follow instructions from your health care provider about how to take care of the puncture site.  Get help right away if you have any symptoms of infection or if you have more blood or fluid coming from the puncture site. This information is not intended to replace advice given to you by your health care provider. Make sure you discuss any questions you have with your health care provider. Document Released: 06/27/2005 Document Revised: 03/23/2018 Document Reviewed: 05/21/2016 Elsevier Patient Education  2020 [employer].   Thank you for choosing [place] at [facility] to provide your oncology and hematology care.  To afford each patient quality time with our provider, please arrive at least 15 minutes before your scheduled appointment time.   If you have a lab appointment with the [facility] please come  in thru the  Main Entrance and check in at the main information desk  You need to re-schedule your appointment should you arrive 10 or more minutes late.  We strive to give you quality time with our providers, and arriving late affects you and other patients whose appointments are after yours.  Also, if you no show three or more times for appointments you may be dismissed from the clinic at the providers discretion.     Again, thank you for choosing [facility].  Our hope is that these requests will decrease the amount of time that you wait before being seen by our physicians.       _____________________________________________________________  Should you have questions after your visit to [facility], please contact our office at [phone] between the hours of 8:00 a.m. and 4:30 p.m.  Voicemails left after 4:00 p.m. will not be returned until the following business day.  For prescription refill requests, have your pharmacy contact our office and allow 72 hours.    Cancer Center Support Programs:   > Cancer Support Group  2nd Tuesday of the month 1pm-2pm, Journey Room

## 2019-08-31 NOTE — Progress Notes (Signed)
[place] [place], [place] [address]   CLINIC:  Medical Oncology/Hematology  PCP:  Neale Burly, MD [place] [id number] [phone]   REASON FOR VISIT:  Follow-up for elevated ferritin and pancytopenia.    INTERVAL HISTORY:  Gabriella Leon 63 y.o. female seen for follow-up of pancytopenia and elevated ferritin.  Denies any fevers, night sweats or weight loss.  Current active smoker, smokes half pack per day.  She has a history of hepatitis C and cirrhosis.  Denies any nausea, vomiting or constipation.  Shortness of breath on exertion is stable.  Mild diarrhea is also stable.  Denies any joint pains.  No headaches or vision changes.    REVIEW OF SYSTEMS:  Review of Systems  Constitutional: Positive for fatigue.  Respiratory: Positive for shortness of breath.   Gastrointestinal: Positive for diarrhea.  Psychiatric/Behavioral: Positive for sleep disturbance.  All other systems reviewed and are negative.    PAST MEDICAL/SURGICAL HISTORY:  Past Medical History:  Diagnosis Date  . Acid reflux disease with ulcer   . Anxiety   . Arthritis   . Asthma   . Blood dyscrasia    thrombocytopenia-next tx injection 09/25/15  . Blood transfusion   . Chronic kidney disease   . Cirrhosis ([facility])   . DDD (degenerative disc disease), lumbar   . Depression   . Diabetes mellitus   . Esophageal varices ([facility]) 09/18/2014  . Folate deficiency 03/28/2014   Noted on 03/23/2014.  Folate 1 mg prescribed.  . Gallstones   . GERD (gastroesophageal reflux disease)   . Headache    migraines  . Helicobacter pylori ab+    July 2010, s/p treatment  . Hepatitis B antibody positive   . Hepatitis C    and B positive antibody  . History of alcoholism ([facility])   . History of kidney stones   . Hypertension   . Left leg pain   . Liver cirrhosis ([facility])   . Low back pain   . Other pancytopenia ([facility]) 10/27/2013  . Ulcer    ?   Past Surgical History:   Procedure Laterality Date  . ANTERIOR CERVICAL DECOMP/DISCECTOMY FUSION N/A 09/28/2015   Procedure: Cervical three-four Anterior cervical decompression/diskectomy/fusion;  Surgeon: Leeroy Cha, MD;  Location: [facility];  Service: Neurosurgery;  Laterality: N/A;  C3-4 Anterior cervical decompression/diskectomy/fusion  . APPENDECTOMY     age 77  . BIOPSY  12/18/2011      . BIOPSY  05/19/2019   Procedure: BIOPSY;  Surgeon: Daneil Dolin, MD;  Location: [facility];  Service: Endoscopy;;  gastric  . [place]  greater than 10 yrs   [place]  . COLONOSCOPY  12/18/11   minimal anal canal hemorrhoids, friable rectal and colonic mucosa, left-sided diverticulosis, repeat in 2022.   . ESOPHAGOGASTRODUODENOSCOPY  12/18/11   3 columns of Grade II esophageal varices, reflux esohpagitis, portal gastropathy, path with chronic gastritis, negative H.pylori, surveillance in June 2014  . ESOPHAGOGASTRODUODENOSCOPY (EGD) WITH PROPOFOL N/A 04/13/2014   Dr. [name]: grade 1-2 varices, hiatal hernia, portal gastropathy. NO need for further surveillance while on prophylaxis unless evidence of bleeding  . ESOPHAGOGASTRODUODENOSCOPY (EGD) WITH PROPOFOL N/A 05/19/2019   erosive reflux esophagitis, Grade 1-2 esophageal varices with patulous EG junction. Hiatal hernia. Portal gastropathy. Gastric and duodenal dulcer disease.   . MULTIPLE EXTRACTIONS WITH ALVEOLOPLASTY  03/08/2012   Procedure: MULTIPLE EXTRACION WITH ALVEOLOPLASTY;  Surgeon: Gae Bon, DDS;  Location: [facility];  Service: Oral Surgery;  Laterality: N/A;     SOCIAL HISTORY:  Social History   Socioeconomic History  . Marital status: Legally Separated    Spouse name: Not on file  . Number of children: 1  . Years of education: 10th grade  . Highest education level: Not on file  Occupational History  . Occupation: disabled    Fish farm manager: NOT EMPLOYED  Social Needs  . Financial resource strain: Not on file   . Food insecurity    Worry: Not on file    Inability: Not on file  . Transportation needs    Medical: Not on file    Non-medical: Not on file  Tobacco Use  . Smoking status: Current Every Day Smoker    Packs/day: 0.50    Years: 20.00    Pack years: 10.00    Types: Cigarettes  . Smokeless tobacco: Never Used  Substance and Sexual Activity  . Alcohol use: No    Comment: last use 2015  . Drug use: No  . Sexual activity: Yes    Birth control/protection: Post-menopausal  Lifestyle  . Physical activity    Days per week: Not on file    Minutes per session: Not on file  . Stress: Not on file  Relationships  . Social Herbalist on phone: Not on file    Gets together: Not on file    Attends religious service: Not on file    Active member of club or organization: Not on file    Attends meetings of clubs or organizations: Not on file    Relationship status: Not on file  . Intimate partner violence    Fear of current or ex partner: Not on file    Emotionally abused: Not on file    Physically abused: Not on file    Forced sexual activity: Not on file  Other Topics Concern  . Not on file  Social History Narrative   Lives at home alone.   Right-handed.   Occasional caffeine use.    FAMILY HISTORY:  Family History  Problem Relation Age of Onset  . Diabetes Mother   . Heart disease Mother   . Cancer Paternal Uncle        passed away age 54  . Other Father        died in [place] in 31  . Heart disease Sister   . Anesthesia problems Neg Hx   . Hypotension Neg Hx   . Malignant hyperthermia Neg Hx   . Pseudochol deficiency Neg Hx   . Colon cancer Neg Hx   . Liver disease Neg Hx     CURRENT MEDICATIONS:  Outpatient Encounter Medications as of 08/31/2019  Medication Sig  . famotidine (PEPCID) 20 MG tablet Take 20 mg by mouth 2 (two) times daily.   . furosemide (LASIX) 40 MG tablet Take 40 mg by mouth daily.   [name] glimepiride (AMARYL) 2 MG tablet Take 2 mg by mouth  daily as needed (if blood sugar is >160).   [name] HYDROcodone-acetaminophen (NORCO/VICODIN) 5-325 MG per tablet Take 1 tablet by mouth every 6 (six) hours as needed for moderate pain.  [name] lisinopril-hydrochlorothiazide (PRINZIDE,ZESTORETIC) 10-12.5 MG per tablet Take 1 tablet by mouth daily.  . metFORMIN (GLUCOPHAGE) 500 MG tablet Take 500 mg by mouth daily with breakfast.   . nadolol (CORGARD) 40 MG tablet Take 1 tablet by mouth daily.   . pantoprazole (  PROTONIX) 40 MG tablet Take 1 tablet by mouth 2 (two) times a day.  . spironolactone (ALDACTONE) 25 MG tablet Take 1 tablet (25 mg total) by mouth daily.   No facility-administered encounter medications on file as of 08/31/2019.     ALLERGIES:  No Known Allergies   PHYSICAL EXAM:  ECOG Performance status: 1  Vitals:   08/31/19 0859 08/31/19 1000  BP: 127/68 120/62  Pulse: (!) 50 60  Resp: 18 18  Temp: (!) 97.1 F (36.2 C)   SpO2: 100% 100%   There were no vitals filed for this visit.  Physical Exam Vitals signs reviewed.  Constitutional:      Appearance: Normal appearance.  Cardiovascular:     Rate and Rhythm: Normal rate and regular rhythm.     Heart sounds: Normal heart sounds.  Pulmonary:     Effort: Pulmonary effort is normal.     Breath sounds: Normal breath sounds.  Abdominal:     General: There is no distension.     Palpations: Abdomen is soft. There is no mass.  Musculoskeletal: Normal range of motion.  Skin:    General: Skin is warm.  Neurological:     General: No focal deficit present.     Mental Status: She is alert and oriented to person, place, and time.  Psychiatric:        Mood and Affect: Mood normal.        Behavior: Behavior normal.      LABORATORY DATA:  I have reviewed the labs as listed.  CBC    Component Value Date/Time   WBC 2.2 (L) 08/31/2019 0841   RBC 2.69 (L) 08/31/2019 0841   HGB 9.8 (L) 08/31/2019 0841   HGB 11.6 04/14/2016 1326   HCT 29.5 (L) 08/31/2019 0841   HCT 34.2 (L)  04/14/2016 1326   PLT 56 (L) 08/31/2019 0841   PLT 49 (L) 04/14/2016 1326   MCV 109.7 (H) 08/31/2019 0841   MCV 102.4 (H) 04/14/2016 1326   MCH 36.4 (H) 08/31/2019 0841   MCHC 33.2 08/31/2019 0841   RDW 12.8 08/31/2019 0841   RDW 13.5 04/14/2016 1326   LYMPHSABS 1.0 08/31/2019 0841   LYMPHSABS 0.7 (L) 04/14/2016 1326   MONOABS 0.3 08/31/2019 0841   MONOABS 0.3 04/14/2016 1326   EOSABS 0.1 08/31/2019 0841   EOSABS 0.1 04/14/2016 1326   BASOSABS 0.0 08/31/2019 0841   BASOSABS 0.0 04/14/2016 1326   CMP Latest Ref Rng & Units 08/04/2019 08/01/2019 06/21/2019  Glucose 70 - 99 mg/dL 118(H) 109(H) 112(H)  BUN 6 - 20 mg/dL 15 13 16   Creatinine 0.44 - 1.00 mg/dL 1.32(H) 1.16(H) 1.10(H)  Sodium 135 - 145 mmol/L 135 135 138  Potassium 3.5 - 5.1 mmol/L 4.0 4.1 4.0  Chloride 98 - 111 mmol/L 102 101 104  CO2 22 - 32 mmol/L 24 23 27   Calcium 8.9 - 10.3 mg/dL 8.7(L) 9.3 9.3  Total Protein 6.5 - 8.1 g/dL - 7.7 7.2  Total Bilirubin 0.3 - 1.2 mg/dL - 2.0(H) 1.9(H)  Alkaline Phos 38 - 126 U/L - 105 -  AST 15 - 41 U/L - 49(H) 41(H)  ALT 0 - 44 U/L - 22 20       DIAGNOSTIC IMAGING:  I have independently reviewed the scans and discussed with the patient.   I have reviewed Gabriella Lick LPN's note and agree with the documentation.  I personally performed a face-to-face visit, made revisions and my assessment and plan is as follows.  ASSESSMENT & PLAN:   Other pancytopenia 1.  Elevated ferritin levels: - This patient has a history of hepatitis C and cirrhosis. - Alcohol abuse, quit completely 8 years ago. - Labs on 06/21/2019 shows ferritin of 836 and percent saturation of 93. - Elevated ferritin in the past from 2012, in the range of 400-680. - CT [facility] on 07/06/2019 shows nodular contour of the liver with cirrhosis.  Spleen is normal.  Significant varices present.  No significant ascites.  No adenopathy. - Hemochromatosis labs were negative.  Elevated ferritin levels likely from liver  disease and hep C.  2.  Pancytopenia: -Labs on 06/21/2019 shows white count of 1.6 with ANC of 890.  Platelet count is 47. -CT on 07/06/2019 shows normal spleen.  Does not report any recurrent infections.  Does not report any fevers, night sweats or weight loss. -Labs from August 04, 2019 are still consistent with pancytopenia with white blood cell count of 2.6, hemoglobin of 10.4, platelet count of 57,000.  No differential was done. -Differential diagnosis includes hypersplenism versus bone marrow infiltrative process.  I have recommended bone marrow aspiration biopsy to evaluate this.  We talked about rare complications including bleeding and infection.  We will proceed with bone marrow biopsy today.   3.  Smoking history: -Current active smoker, smoked half pack per day for 36 years. -We will consider low-dose CT scan for screening.  4.  Avascular necrosis of left femoral head.   -Patient will be scheduled for hip replacement pending work-up for pancytopenia.  Total time spent is 25 minutes with more than 50% of the time spent face-to-face discussing lab results, further plan, counseling and coordination of care.    Orders placed this encounter:  No orders of the defined types were placed in this encounter.     Derek Jack, MD [place] [phone]

## 2019-08-31 NOTE — Progress Notes (Signed)
Patient here today for bone marrow biopsy. Procedure explained and consent signed by all parties at 0914. Patient placed in prone position with both arms above head. Time out conducted at 0919 all parties agreed. Procedure started at 0923. Patient tolerated procedure well with minimal pain and discomfort. Specimens collected and labeled appropriately. Procedure completed at 0945. Dressing applied and patient reposition on back, sitting up, resting at 0950. Specimens taken to lab for processing. Patient stable and discharged home via wheelchair.

## 2019-09-01 DIAGNOSIS — K7460 Unspecified cirrhosis of liver: Secondary | ICD-10-CM | POA: Diagnosis not present

## 2019-09-01 LAB — PROTIME-INR: INR: 1.3 — AB (ref 0.9–1.1)

## 2019-09-04 ENCOUNTER — Telehealth: Payer: Self-pay | Admitting: Gastroenterology

## 2019-09-04 NOTE — Assessment & Plan Note (Signed)
63 year old female with history of Hep C s/p treatment with documented SVR, cirrhosis, now with slowly declining renal function and on diuretic therapy. Nadolol also started recently due to varices. I am concerned about continuing diuretic therapy until we recheck renal function. Nadolol may also not be tolerated if declining renal function; in that case, we would need to continue with surveillance EGDs and serial banding.    Nov 2020 US abdomen Check CMP, INR Stop lasix and aldactone now until review of renal function May need to decrease Nadolol if worsening renal function Return in 2 months for close follow-up.

## 2019-09-04 NOTE — Assessment & Plan Note (Signed)
Needs surveillance EGD due to PUD, with last EGD in May 2020. Negative H.pylori. Due to chest pain, procedure was cancelled. Now with intermittent chest pain, worsened by exertion and associated with shortness of breath. Refer to cardiology now and will receive clearance prior to elective procedure.

## 2019-09-04 NOTE — Telephone Encounter (Signed)
Gabriella Leon, can we ask patient to complete CMP? We need to see renal function to determine diuretic therapy and Nadolol.

## 2019-09-05 ENCOUNTER — Telehealth: Payer: Self-pay | Admitting: Internal Medicine

## 2019-09-05 NOTE — Telephone Encounter (Signed)
Left a detailed message asking pt to get her lab work completed at [facility] per AB.

## 2019-09-05 NOTE — Telephone Encounter (Signed)
Pt said the Dr Alvan Dame needed to speak with the nurse about her lab work regarding her platelets and having her hip replacement. She couldn't find Dr Aurea Graff phone number. I told her that I would let the nurse be aware and Dr Aurea Graff office could call here to request nurse and/or medical records if needed.

## 2019-09-06 NOTE — Telephone Encounter (Signed)
Spoke with pt. Pt said that her doctors office was suppose to contact our office. I haven't spoken to a nurse or received paperwork from an office. I asked pt if she had a name of the office or phone number so I could call. Pt was going to look for that information and contact our office back. Pt isn't sure what the office is needing from our providers.

## 2019-09-06 NOTE — Telephone Encounter (Signed)
Spoke with pt and let her know that I spoke with Dr. Aurea Graff office. Our office has requested records from doctor Olin's office as of 09/05/2019 when she called yesterday. Dr. Aurea Graff office will send ov notes when complete. Pt was asked to complete blood work as requested by AB at her appointment on 08/30/2019.

## 2019-09-07 ENCOUNTER — Encounter (HOSPITAL_COMMUNITY): Payer: Self-pay | Admitting: Hematology

## 2019-09-07 NOTE — Telephone Encounter (Signed)
Gabriella Leon, can you request records from [facility] had her labs done there.

## 2019-09-11 NOTE — Telephone Encounter (Signed)
requested

## 2019-09-13 ENCOUNTER — Encounter (HOSPITAL_COMMUNITY): Payer: Self-pay | Admitting: Hematology

## 2019-09-15 ENCOUNTER — Ambulatory Visit (HOSPITAL_COMMUNITY): Payer: Medicare Other | Admitting: Hematology

## 2019-09-21 NOTE — Telephone Encounter (Signed)
Alicia:   I reviewed notes. Dr. Alvan Dame is requesting Hematology clearance, not from [place]. Recent bone marrow biopsy 08/31/19. They are working up pancytopenia prior to hip replacement. Cirrhosis playing a role with thrombocytopenia as well.   I received her labs from 9/10. Creatinine 1.14, BUN 14. Alk Phos 137, AST 36, ALT 18, Sodium 132, potassium 4.2, albumin 3.5, INR 1.3.   How is lower extremity edema? If recurrent, we can resume low dose diuretics and keep close eye on renal function. She is on Nadolol 40 mg daily. Does she feel fatigued, dizzy, etc? May need to decrease Nadolol dose as well. Just need progress report.

## 2019-09-22 NOTE — Telephone Encounter (Signed)
Spoke with pt. She isn't having any swelling. Pt states she does feel very tired, fatigue and has some dizziness. Pt is  Currently taking Nadolol 40 mg daily.

## 2019-09-23 NOTE — Telephone Encounter (Signed)
Noted. Pt will decrease Nadolol to 20 mg daily. Pt will call with an update as directed.

## 2019-09-23 NOTE — Telephone Encounter (Signed)
May not be tolerating Nadolol well. Let's decrease it to 20 mg daily. Have her call next week with an update. I will be seeing her in Nov.

## 2019-09-27 ENCOUNTER — Inpatient Hospital Stay (HOSPITAL_COMMUNITY): Payer: Medicare Other | Attending: Hematology | Admitting: Hematology

## 2019-09-27 ENCOUNTER — Encounter (HOSPITAL_COMMUNITY): Payer: Self-pay | Admitting: Hematology

## 2019-09-27 ENCOUNTER — Inpatient Hospital Stay (HOSPITAL_COMMUNITY): Payer: Medicare Other

## 2019-09-27 ENCOUNTER — Other Ambulatory Visit: Payer: Self-pay

## 2019-09-27 VITALS — BP 150/82 | HR 80 | Temp 97.3°F | Resp 18 | Wt 170.3 lb

## 2019-09-27 DIAGNOSIS — Z809 Family history of malignant neoplasm, unspecified: Secondary | ICD-10-CM | POA: Diagnosis not present

## 2019-09-27 DIAGNOSIS — F17210 Nicotine dependence, cigarettes, uncomplicated: Secondary | ICD-10-CM | POA: Insufficient documentation

## 2019-09-27 DIAGNOSIS — D61818 Other pancytopenia: Secondary | ICD-10-CM | POA: Diagnosis not present

## 2019-09-27 DIAGNOSIS — R7989 Other specified abnormal findings of blood chemistry: Secondary | ICD-10-CM | POA: Diagnosis not present

## 2019-09-27 DIAGNOSIS — M879 Osteonecrosis, unspecified: Secondary | ICD-10-CM | POA: Insufficient documentation

## 2019-09-27 MED ORDER — DEXAMETHASONE 4 MG PO TABS: 40.0000 mg | ORAL_TABLET | Freq: Every day | ORAL | 0 refills | Status: DC

## 2019-09-27 NOTE — Progress Notes (Signed)
[name], [name] [address]   CLINIC:  Medical Oncology/Hematology  PCP:  Neale Burly, MD [place] [phone]   REASON FOR VISIT:  Follow-up for elevated ferritin and pancytopenia.    INTERVAL HISTORY:  [name] 63 y.o. female seen for follow-up after bone marrow biopsy for pancytopenia.  She does report pain in the left hip which is constant.  Denies any fevers, night sweats or weight loss.  Reports appetite and energy levels are 25%.  Numbness in the feet has been stable.  Sleep problems and depression is also stable.    REVIEW OF SYSTEMS:  Review of Systems  Respiratory: Positive for shortness of breath.   Neurological: Positive for headaches and numbness.  Psychiatric/Behavioral: Positive for depression and sleep disturbance.  All other systems reviewed and are negative.    PAST MEDICAL/SURGICAL HISTORY:  Past Medical History:  Diagnosis Date  . Acid reflux disease with ulcer   . Anxiety   . Arthritis   . Asthma   . Blood dyscrasia    thrombocytopenia-next tx injection 09/25/15  . Blood transfusion   . Chronic kidney disease   . Cirrhosis ([place])   . DDD (degenerative disc disease), lumbar   . Depression   . Diabetes mellitus   . Esophageal varices ([place]) 09/18/2014  . Folate deficiency 03/28/2014   Noted on 03/23/2014.  Folate 1 mg prescribed.  . Gallstones   . GERD (gastroesophageal reflux disease)   . Headache    migraines  . Helicobacter pylori ab+    July 2010, s/p treatment  . Hepatitis B antibody positive   . Hepatitis C    and B positive antibody  . History of alcoholism ([place])   . History of kidney stones   . Hypertension   . Left leg pain   . Liver cirrhosis ([place])   . Low back pain   . Other pancytopenia ([place]) 10/27/2013  . Ulcer    ?   Past Surgical History:  Procedure Laterality Date  . ANTERIOR CERVICAL DECOMP/DISCECTOMY FUSION N/A 09/28/2015   Procedure:  Cervical three-four Anterior cervical decompression/diskectomy/fusion;  Surgeon: Leeroy Cha, MD;  Location: [facility];  Service: Neurosurgery;  Laterality: N/A;  C3-4 Anterior cervical decompression/diskectomy/fusion  . APPENDECTOMY     age 57  . BIOPSY  12/18/2011      . BIOPSY  05/19/2019   Procedure: BIOPSY;  Surgeon: Daneil Dolin, MD;  Location: [facility];  Service: Endoscopy;;  gastric  . [place]  greater than 10 yrs   [place]  . COLONOSCOPY  12/18/11   minimal anal canal hemorrhoids, friable rectal and colonic mucosa, left-sided diverticulosis, repeat in 2022.   . ESOPHAGOGASTRODUODENOSCOPY  12/18/11   3 columns of Grade II esophageal varices, reflux esohpagitis, portal gastropathy, path with chronic gastritis, negative H.pylori, surveillance in June 2014  . ESOPHAGOGASTRODUODENOSCOPY (EGD) WITH PROPOFOL N/A 04/13/2014   Dr. [name]: grade 1-2 varices, hiatal hernia, portal gastropathy. NO need for further surveillance while on prophylaxis unless evidence of bleeding  . ESOPHAGOGASTRODUODENOSCOPY (EGD) WITH PROPOFOL N/A 05/19/2019   erosive reflux esophagitis, Grade 1-2 esophageal varices with patulous EG junction. Hiatal hernia. Portal gastropathy. Gastric and duodenal dulcer disease.   . MULTIPLE EXTRACTIONS WITH ALVEOLOPLASTY  03/08/2012   Procedure: MULTIPLE EXTRACION WITH ALVEOLOPLASTY;  Surgeon: Gae Bon, DDS;  Location: [facility];  Service: Oral Surgery;  Laterality:  N/A;     SOCIAL HISTORY:  Social History   Socioeconomic History  . Marital status: Legally Separated    Spouse name: Not on file  . Number of children: 1  . Years of education: 10th grade  . Highest education level: Not on file  Occupational History  . Occupation: disabled    Fish farm manager: NOT EMPLOYED  Social Needs  . Financial resource strain: Not on file  . Food insecurity    Worry: Not on file    Inability: Not on file  . Transportation needs     Medical: Not on file    Non-medical: Not on file  Tobacco Use  . Smoking status: Current Every Day Smoker    Packs/day: 0.50    Years: 20.00    Pack years: 10.00    Types: Cigarettes  . Smokeless tobacco: Never Used  Substance and Sexual Activity  . Alcohol use: No    Comment: last use 2015  . Drug use: No  . Sexual activity: Yes    Birth control/protection: Post-menopausal  Lifestyle  . Physical activity    Days per week: Not on file    Minutes per session: Not on file  . Stress: Not on file  Relationships  . Social Herbalist on phone: Not on file    Gets together: Not on file    Attends religious service: Not on file    Active member of club or organization: Not on file    Attends meetings of clubs or organizations: Not on file    Relationship status: Not on file  . Intimate partner violence    Fear of current or ex partner: Not on file    Emotionally abused: Not on file    Physically abused: Not on file    Forced sexual activity: Not on file  Other Topics Concern  . Not on file  Social History Narrative   Lives at home alone.   Right-handed.   Occasional caffeine use.    FAMILY HISTORY:  Family History  Problem Relation Age of Onset  . Diabetes Mother   . Heart disease Mother   . Cancer Paternal Uncle        passed away age 18  . Other Father        died in [place] in 62  . Heart disease Sister   . Anesthesia problems Neg Hx   . Hypotension Neg Hx   . Malignant hyperthermia Neg Hx   . Pseudochol deficiency Neg Hx   . Colon cancer Neg Hx   . Liver disease Neg Hx     CURRENT MEDICATIONS:  Outpatient Encounter Medications as of 09/27/2019  Medication Sig  . famotidine (PEPCID) 20 MG tablet Take 20 mg by mouth 2 (two) times daily.   . furosemide (LASIX) 40 MG tablet Take 40 mg by mouth daily.   [name] glimepiride (AMARYL) 2 MG tablet Take 2 mg by mouth daily as needed (if blood sugar is >160).   [name] lisinopril-hydrochlorothiazide (PRINZIDE,ZESTORETIC)  10-12.5 MG per tablet Take 1 tablet by mouth daily.  . metFORMIN (GLUCOPHAGE) 500 MG tablet Take 500 mg by mouth daily with breakfast.   . nadolol (CORGARD) 40 MG tablet Take 1 tablet by mouth daily.   . pantoprazole (PROTONIX) 40 MG tablet Take 1 tablet by mouth 2 (two) times a day.  . spironolactone (ALDACTONE) 25 MG tablet Take 1 tablet (25 mg total) by mouth daily.  [name] dexamethasone (DECADRON) 4 MG tablet  Take 10 tablets (40 mg total) by mouth daily.  [name] HYDROcodone-acetaminophen (NORCO/VICODIN) 5-325 MG per tablet Take 1 tablet by mouth every 6 (six) hours as needed for moderate pain.   No facility-administered encounter medications on file as of 09/27/2019.     ALLERGIES:  No Known Allergies   PHYSICAL EXAM:  ECOG Performance status: 1  Vitals:   09/27/19 1029  BP: (!) 150/82  Pulse: 80  Resp: 18  Temp: (!) 97.3 F (36.3 C)  SpO2: 100%   Filed Weights   09/27/19 1029  Weight: 170 lb 4.8 oz (77.2 kg)    Physical Exam Vitals signs reviewed.  Constitutional:      Appearance: Normal appearance.  Cardiovascular:     Rate and Rhythm: Normal rate and regular rhythm.     Heart sounds: Normal heart sounds.  Pulmonary:     Effort: Pulmonary effort is normal.     Breath sounds: Normal breath sounds.  Abdominal:     General: There is no distension.     Palpations: Abdomen is soft. There is no mass.  Musculoskeletal: Normal range of motion.  Skin:    General: Skin is warm.  Neurological:     General: No focal deficit present.     Mental Status: She is alert and oriented to person, place, and time.  Psychiatric:        Mood and Affect: Mood normal.        Behavior: Behavior normal.      LABORATORY DATA:  I have reviewed the labs as listed.  CBC    Component Value Date/Time   WBC 2.2 (L) 08/31/2019 0841   RBC 2.69 (L) 08/31/2019 0841   HGB 9.8 (L) 08/31/2019 0841   HGB 11.6 04/14/2016 1326   HCT 29.5 (L) 08/31/2019 0841   HCT 34.2 (L) 04/14/2016 1326   PLT 56  (L) 08/31/2019 0841   PLT 49 (L) 04/14/2016 1326   MCV 109.7 (H) 08/31/2019 0841   MCV 102.4 (H) 04/14/2016 1326   MCH 36.4 (H) 08/31/2019 0841   MCHC 33.2 08/31/2019 0841   RDW 12.8 08/31/2019 0841   RDW 13.5 04/14/2016 1326   LYMPHSABS 1.0 08/31/2019 0841   LYMPHSABS 0.7 (L) 04/14/2016 1326   MONOABS 0.3 08/31/2019 0841   MONOABS 0.3 04/14/2016 1326   EOSABS 0.1 08/31/2019 0841   EOSABS 0.1 04/14/2016 1326   BASOSABS 0.0 08/31/2019 0841   BASOSABS 0.0 04/14/2016 1326   CMP Latest Ref Rng & Units 08/04/2019 08/01/2019 06/21/2019  Glucose 70 - 99 mg/dL 118(H) 109(H) 112(H)  BUN 6 - 20 mg/dL 15 13 16   Creatinine 0.44 - 1.00 mg/dL 1.32(H) 1.16(H) 1.10(H)  Sodium 135 - 145 mmol/L 135 135 138  Potassium 3.5 - 5.1 mmol/L 4.0 4.1 4.0  Chloride 98 - 111 mmol/L 102 101 104  CO2 22 - 32 mmol/L 24 23 27   Calcium 8.9 - 10.3 mg/dL 8.7(L) 9.3 9.3  Total Protein 6.5 - 8.1 g/dL - 7.7 7.2  Total Bilirubin 0.3 - 1.2 mg/dL - 2.0(H) 1.9(H)  Alkaline Phos 38 - 126 U/L - 105 -  AST 15 - 41 U/L - 49(H) 41(H)  ALT 0 - 44 U/L - 22 20       DIAGNOSTIC IMAGING:  I have independently reviewed the scans and discussed with the patient.   I have reviewed Venita Lick LPN's note and agree with the documentation.  I personally performed a face-to-face visit, made revisions and my assessment and plan is as  follows.    ASSESSMENT & PLAN:   Other pancytopenia 1.  Pancytopenia: - CT scan on 07/06/2019 shows normal spleen.  Does not report any recurrent infections.  No fevers, night sweats or weight loss.  She had decreased platelet count and white count since 2012. - Blood counts on 08/04/2019 shows white count 2.6, hemoglobin 10.4, platelet count 57K. - We reviewed results of the bone marrow biopsy dated 08/31/2019 which showed mildly hypocellular marrow with mild erythroid hyperplasia.  No significant dysplasia.  Aspirate was spicular and cellular.  Mild reactive increase in erythroid precursors.  Mild  reactive decrease in granulocytic precursors.  Megakaryocytes are present and morphologically unremarkable.  MDS FISH panel was negative. - She is having to have left hip surgery for avascular necrosis and want high platelet count prior to surgery. -We talked about giving her a trial of dexamethasone 40 mg for 4 days.  We discussed side effects in detail. -I have sent a prescription to her pharmacy.  I will see her back next Wednesday and repeat platelet count.  2.  Elevated ferritin levels: -This is from history of hepatitis C and cirrhosis.  Alcohol abuse, quit completely 8 years ago. - CTAP on 07/06/2019 showed nodular contour of the liver with cirrhosis.  Spleen is normal.  Significant varices present.  No significant ascites. - Hemochromatosis labs were negative.  Elevated ferritin likely from hep C and liver disease.  3.  Smoking history: -Current active smoker, smoked half to 1 pack/day for 36 years. - She will be referred to lung cancer screening program.  Total time spent is 40 minutes with more than 50% of the time spent face-to-face with bone marrow biopsy results, differential diagnosis, treatment plan, counseling and coordination of care.    Orders placed this encounter:  Orders Placed This Encounter  Procedures  . Miscellaneous [facility] test (send-out)  . CBC with Differential/Platelet      Derek Jack, MD [place] [phone]

## 2019-09-27 NOTE — Patient Instructions (Addendum)
[place] at [facility] Discharge Instructions  You were seen today by Dr. Delton Coombes. He went over your recent biopsy and lab results. He will refer you to our lung cancer screening program. He will also get additional blood work today. He will see you back next Wednesday for labs and follow up.  Start taking the Dexamethasone this afternoon then change to mornings starting tomorrow. Continue taking with the last dose being Friday morning.  Thank you for choosing [place] at [facility] to provide your oncology and hematology care.  To afford each patient quality time with our provider, please arrive at least 15 minutes before your scheduled appointment time.   If you have a lab appointment with the [facility] please come in thru the  Main Entrance and check in at the main information desk  You need to re-schedule your appointment should you arrive 10 or more minutes late.  We strive to give you quality time with our providers, and arriving late affects you and other patients whose appointments are after yours.  Also, if you no show three or more times for appointments you may be dismissed from the clinic at the providers discretion.     Again, thank you for choosing [facility].  Our hope is that these requests will decrease the amount of time that you wait before being seen by our physicians.       _____________________________________________________________  Should you have questions after your visit to [facility], please contact our office at [phone] between the hours of 8:00 a.m. and 4:30 p.m.  Voicemails left after 4:00 p.m. will not be returned until the following business day.  For prescription refill requests, have your pharmacy contact our office and allow 72 hours.    Cancer Center Support Programs:   > Cancer Support Group  2nd Tuesday of the month 1pm-2pm, Journey Room

## 2019-09-27 NOTE — Assessment & Plan Note (Addendum)
1.  Pancytopenia: - CT scan on 07/06/2019 shows normal spleen.  Does not report any recurrent infections.  No fevers, night sweats or weight loss.  She had decreased platelet count and white count since 2012. - Blood counts on 08/04/2019 shows white count 2.6, hemoglobin 10.4, platelet count 57K. - We reviewed results of the bone marrow biopsy dated 08/31/2019 which showed mildly hypocellular marrow with mild erythroid hyperplasia.  No significant dysplasia.  Aspirate was spicular and cellular.  Mild reactive increase in erythroid precursors.  Mild reactive decrease in granulocytic precursors.  Megakaryocytes are present and morphologically unremarkable.  MDS FISH panel was negative. - She is having to have left hip surgery for avascular necrosis and want high platelet count prior to surgery. -We talked about giving her a trial of dexamethasone 40 mg for 4 days.  We discussed side effects in detail. -I have sent a prescription to her pharmacy.  I will see her back next Wednesday and repeat platelet count.  2.  Elevated ferritin levels: -This is from history of hepatitis C and cirrhosis.  Alcohol abuse, quit completely 8 years ago. - CTAP on 07/06/2019 showed nodular contour of the liver with cirrhosis.  Spleen is normal.  Significant varices present.  No significant ascites. - Hemochromatosis labs were negative.  Elevated ferritin likely from hep C and liver disease.  3.  Smoking history: -Current active smoker, smoked half to 1 pack/day for 36 years. - She will be referred to lung cancer screening program.

## 2019-09-29 ENCOUNTER — Encounter (HOSPITAL_COMMUNITY): Payer: Self-pay | Admitting: *Deleted

## 2019-09-29 ENCOUNTER — Other Ambulatory Visit (HOSPITAL_COMMUNITY): Payer: Self-pay | Admitting: *Deleted

## 2019-09-29 DIAGNOSIS — Z122 Encounter for screening for malignant neoplasm of respiratory organs: Secondary | ICD-10-CM

## 2019-09-29 DIAGNOSIS — Z87891 Personal history of nicotine dependence: Secondary | ICD-10-CM

## 2019-09-29 NOTE — Progress Notes (Signed)
Received referral for initial lung cancer screening scan.  Contacted patient and obtained smoking history (started age 63, current smoker, smoked for 38 years 1 ppd and last 2 years 0.5 ppd, 39 pack year) as well as answering questions related to the screening process.  Patient denies signs/symptoms of lung cancer such as weight loss or hemoptysis.  Patient denies comorbidity that would prevent curative treatment if lung cancer were to be found.  Patient is scheduled for shared decision making visit and CT scan on 10/18/19 at 1030.

## 2019-10-05 ENCOUNTER — Inpatient Hospital Stay (HOSPITAL_COMMUNITY): Payer: Medicare Other

## 2019-10-05 ENCOUNTER — Encounter (HOSPITAL_COMMUNITY): Payer: Self-pay | Admitting: Hematology

## 2019-10-05 ENCOUNTER — Inpatient Hospital Stay (HOSPITAL_BASED_OUTPATIENT_CLINIC_OR_DEPARTMENT_OTHER): Payer: Medicare Other | Admitting: Hematology

## 2019-10-05 ENCOUNTER — Other Ambulatory Visit: Payer: Self-pay

## 2019-10-05 VITALS — BP 201/93 | HR 59 | Temp 97.8°F | Resp 18 | Wt 181.1 lb

## 2019-10-05 DIAGNOSIS — D61818 Other pancytopenia: Secondary | ICD-10-CM

## 2019-10-05 DIAGNOSIS — Z809 Family history of malignant neoplasm, unspecified: Secondary | ICD-10-CM | POA: Diagnosis not present

## 2019-10-05 DIAGNOSIS — F17210 Nicotine dependence, cigarettes, uncomplicated: Secondary | ICD-10-CM | POA: Diagnosis not present

## 2019-10-05 DIAGNOSIS — M879 Osteonecrosis, unspecified: Secondary | ICD-10-CM | POA: Diagnosis not present

## 2019-10-05 DIAGNOSIS — R7989 Other specified abnormal findings of blood chemistry: Secondary | ICD-10-CM | POA: Diagnosis not present

## 2019-10-05 LAB — CBC WITH DIFFERENTIAL/PLATELET
Abs Immature Granulocytes: 0.12 10*3/uL — ABNORMAL HIGH (ref 0.00–0.07)
Basophils Absolute: 0 10*3/uL (ref 0.0–0.1)
Basophils Relative: 0 %
Eosinophils Absolute: 0 10*3/uL (ref 0.0–0.5)
Eosinophils Relative: 0 %
HCT: 33.2 % — ABNORMAL LOW (ref 36.0–46.0)
Hemoglobin: 11.3 g/dL — ABNORMAL LOW (ref 12.0–15.0)
Immature Granulocytes: 2 %
Lymphocytes Relative: 8 %
Lymphs Abs: 0.4 10*3/uL — ABNORMAL LOW (ref 0.7–4.0)
MCH: 36.6 pg — ABNORMAL HIGH (ref 26.0–34.0)
MCHC: 34 g/dL (ref 30.0–36.0)
MCV: 107.4 fL — ABNORMAL HIGH (ref 80.0–100.0)
Monocytes Absolute: 0.4 10*3/uL (ref 0.1–1.0)
Monocytes Relative: 8 %
Neutro Abs: 4.7 10*3/uL (ref 1.7–7.7)
Neutrophils Relative %: 82 %
Platelets: 90 10*3/uL — ABNORMAL LOW (ref 150–400)
RBC: 3.09 MIL/uL — ABNORMAL LOW (ref 3.87–5.11)
RDW: 13.8 % (ref 11.5–15.5)
WBC: 5.7 10*3/uL (ref 4.0–10.5)
nRBC: 0 % (ref 0.0–0.2)

## 2019-10-05 NOTE — Patient Instructions (Signed)
[facility] at [facility] Discharge Instructions  You were seen today by Dr. Katragadda. He went over your recent lab results. He will see you back in 1 month for labs and follow up.   Thank you for choosing [facility] at [facility] to provide your oncology and hematology care.  To afford each patient quality time with our provider, please arrive at least 15 minutes before your scheduled appointment time.   If you have a lab appointment with the Cancer Center please come in thru the  Main Entrance and check in at the main information desk  You need to re-schedule your appointment should you arrive 10 or more minutes late.  We strive to give you quality time with our providers, and arriving late affects you and other patients whose appointments are after yours.  Also, if you no show three or more times for appointments you may be dismissed from the clinic at the providers discretion.     Again, thank you for choosing [facility].  Our hope is that these requests will decrease the amount of time that you wait before being seen by our physicians.       _____________________________________________________________  Should you have questions after your visit to [facility], please contact our office at [phone] between the hours of 8:00 a.m. and 4:30 p.m.  Voicemails left after 4:00 p.m. will not be returned until the following business day.  For prescription refill requests, have your pharmacy contact our office and allow 72 hours.    Cancer Center Support Programs:   > Cancer Support Group  2nd Tuesday of the month 1pm-2pm, Journey Room    

## 2019-10-05 NOTE — Progress Notes (Signed)
[address] [place], [place] [address]   CLINIC:  Medical Oncology/Hematology  PCP:  Neale Burly, MD [place] [phone]   REASON FOR VISIT:  Follow-up for elevated ferritin and pancytopenia.    INTERVAL HISTORY:  Gabriella Leon 63 y.o. female seen for follow-up of pancytopenia, particularly thrombocytopenia.  She is wanting to have left hip surgery done for avascular necrosis.  She was given pulse dexamethasone on 09/27/2019.  She complained of leg swelling and fullness in her abdomen.  Today blood pressure in the office was 201/93.  When we retook the blood pressure it came down to 180/80.  She apparently did not take her blood pressure medications.  Appetite is 25%.  Numbness in the feet has been stable.  Left hip pain is also stable.    REVIEW OF SYSTEMS:  Review of Systems  Respiratory: Positive for shortness of breath.   Cardiovascular: Positive for leg swelling.  Gastrointestinal: Positive for abdominal distention.  Neurological: Positive for headaches and numbness.  Psychiatric/Behavioral: Positive for sleep disturbance.  All other systems reviewed and are negative.    PAST MEDICAL/SURGICAL HISTORY:  Past Medical History:  Diagnosis Date  . Acid reflux disease with ulcer   . Anxiety   . Arthritis   . Asthma   . Blood dyscrasia    thrombocytopenia-next tx injection 09/25/15  . Blood transfusion   . Chronic kidney disease   . Cirrhosis ([place])   . DDD (degenerative disc disease), lumbar   . Depression   . Diabetes mellitus   . Esophageal varices ([place]) 09/18/2014  . Folate deficiency 03/28/2014   Noted on 03/23/2014.  Folate 1 mg prescribed.  . Gallstones   . GERD (gastroesophageal reflux disease)   . Headache    migraines  . Helicobacter pylori ab+    July 2010, s/p treatment  . Hepatitis B antibody positive   . Hepatitis C    and B positive antibody  . History of alcoholism ([place])   .  History of kidney stones   . Hypertension   . Left leg pain   . Liver cirrhosis ([place])   . Low back pain   . Other pancytopenia ([place]) 10/27/2013  . Ulcer    ?   Past Surgical History:  Procedure Laterality Date  . ANTERIOR CERVICAL DECOMP/DISCECTOMY FUSION N/A 09/28/2015   Procedure: Cervical three-four Anterior cervical decompression/diskectomy/fusion;  Surgeon: Leeroy Cha, MD;  Location: [facility];  Service: Neurosurgery;  Laterality: N/A;  C3-4 Anterior cervical decompression/diskectomy/fusion  . APPENDECTOMY     age 48  . BIOPSY  12/18/2011      . BIOPSY  05/19/2019   Procedure: BIOPSY;  Surgeon: Daneil Dolin, MD;  Location: [facility];  Service: Endoscopy;;  gastric  . [place]  greater than 10 yrs   [place]  . COLONOSCOPY  12/18/11   minimal anal canal hemorrhoids, friable rectal and colonic mucosa, left-sided diverticulosis, repeat in 2022.   . ESOPHAGOGASTRODUODENOSCOPY  12/18/11   3 columns of Grade II esophageal varices, reflux esohpagitis, portal gastropathy, path with chronic gastritis, negative H.pylori, surveillance in June 2014  . ESOPHAGOGASTRODUODENOSCOPY (EGD) WITH PROPOFOL N/A 04/13/2014   Dr. [name]: grade 1-2 varices, hiatal hernia, portal gastropathy. NO need for further surveillance while on prophylaxis unless evidence of bleeding  . ESOPHAGOGASTRODUODENOSCOPY (EGD) WITH PROPOFOL N/A 05/19/2019   erosive reflux esophagitis, Grade 1-2 esophageal varices  with patulous EG junction. Hiatal hernia. Portal gastropathy. Gastric and duodenal dulcer disease.   . MULTIPLE EXTRACTIONS WITH ALVEOLOPLASTY  03/08/2012   Procedure: MULTIPLE EXTRACION WITH ALVEOLOPLASTY;  Surgeon: Gae Bon, DDS;  Location: [facility];  Service: Oral Surgery;  Laterality: N/A;     SOCIAL HISTORY:  Social History   Socioeconomic History  . Marital status: Legally Separated    Spouse name: Not on file  . Number of children: 1  . Years of  education: 10th grade  . Highest education level: Not on file  Occupational History  . Occupation: disabled    Fish farm manager: NOT EMPLOYED  Social Needs  . Financial resource strain: Not on file  . Food insecurity    Worry: Not on file    Inability: Not on file  . Transportation needs    Medical: Not on file    Non-medical: Not on file  Tobacco Use  . Smoking status: Current Every Day Smoker    Packs/day: 0.50    Years: 20.00    Pack years: 10.00    Types: Cigarettes  . Smokeless tobacco: Never Used  Substance and Sexual Activity  . Alcohol use: No    Comment: last use 2015  . Drug use: No  . Sexual activity: Yes    Birth control/protection: Post-menopausal  Lifestyle  . Physical activity    Days per week: Not on file    Minutes per session: Not on file  . Stress: Not on file  Relationships  . Social Herbalist on phone: Not on file    Gets together: Not on file    Attends religious service: Not on file    Active member of club or organization: Not on file    Attends meetings of clubs or organizations: Not on file    Relationship status: Not on file  . Intimate partner violence    Fear of current or ex partner: Not on file    Emotionally abused: Not on file    Physically abused: Not on file    Forced sexual activity: Not on file  Other Topics Concern  . Not on file  Social History Narrative   Lives at home alone.   Right-handed.   Occasional caffeine use.    FAMILY HISTORY:  Family History  Problem Relation Age of Onset  . Diabetes Mother   . Heart disease Mother   . Cancer Paternal Uncle        passed away age 83  . Other Father        died in [place] in 75  . Heart disease Sister   . Anesthesia problems Neg Hx   . Hypotension Neg Hx   . Malignant hyperthermia Neg Hx   . Pseudochol deficiency Neg Hx   . Colon cancer Neg Hx   . Liver disease Neg Hx     CURRENT MEDICATIONS:  Outpatient Encounter Medications as of 10/05/2019  Medication Sig  .  dexamethasone (DECADRON) 4 MG tablet Take 10 tablets (40 mg total) by mouth daily.  . famotidine (PEPCID) 20 MG tablet Take 20 mg by mouth 2 (two) times daily.   . furosemide (LASIX) 40 MG tablet Take 40 mg by mouth daily.   [name] lisinopril-hydrochlorothiazide (PRINZIDE,ZESTORETIC) 10-12.5 MG per tablet Take 1 tablet by mouth daily.  . metFORMIN (GLUCOPHAGE) 500 MG tablet Take 500 mg by mouth daily with breakfast.   . nadolol (CORGARD) 40 MG tablet Take 1 tablet by mouth daily.   [name]  pantoprazole (PROTONIX) 40 MG tablet Take 1 tablet by mouth 2 (two) times a day.  . spironolactone (ALDACTONE) 25 MG tablet Take 1 tablet (25 mg total) by mouth daily.  [name] glimepiride (AMARYL) 2 MG tablet Take 2 mg by mouth daily as needed (if blood sugar is >160).   [name] HYDROcodone-acetaminophen (NORCO/VICODIN) 5-325 MG per tablet Take 1 tablet by mouth every 6 (six) hours as needed for moderate pain.   No facility-administered encounter medications on file as of 10/05/2019.     ALLERGIES:  No Known Allergies   PHYSICAL EXAM:  ECOG Performance status: 1  Vitals:   10/05/19 1112  BP: (!) 201/93  Pulse: (!) 59  Resp: 18  Temp: 97.8 F (36.6 C)  SpO2: 100%   Filed Weights   10/05/19 1112  Weight: 181 lb 1.6 oz (82.1 kg)    Physical Exam Vitals signs reviewed.  Constitutional:      Appearance: Normal appearance.  Cardiovascular:     Rate and Rhythm: Normal rate and regular rhythm.     Heart sounds: Normal heart sounds.  Pulmonary:     Effort: Pulmonary effort is normal.     Breath sounds: Normal breath sounds.  Abdominal:     General: There is no distension.     Palpations: Abdomen is soft. There is no mass.  Musculoskeletal: Normal range of motion.  Skin:    General: Skin is warm.  Neurological:     General: No focal deficit present.     Mental Status: She is alert and oriented to person, place, and time.  Psychiatric:        Mood and Affect: Mood normal.        Behavior: Behavior normal.       LABORATORY DATA:  I have reviewed the labs as listed.  CBC    Component Value Date/Time   WBC 5.7 10/05/2019 1030   RBC 3.09 (L) 10/05/2019 1030   HGB 11.3 (L) 10/05/2019 1030   HGB 11.6 04/14/2016 1326   HCT 33.2 (L) 10/05/2019 1030   HCT 34.2 (L) 04/14/2016 1326   PLT 90 (L) 10/05/2019 1030   PLT 49 (L) 04/14/2016 1326   MCV 107.4 (H) 10/05/2019 1030   MCV 102.4 (H) 04/14/2016 1326   MCH 36.6 (H) 10/05/2019 1030   MCHC 34.0 10/05/2019 1030   RDW 13.8 10/05/2019 1030   RDW 13.5 04/14/2016 1326   LYMPHSABS 0.4 (L) 10/05/2019 1030   LYMPHSABS 0.7 (L) 04/14/2016 1326   MONOABS 0.4 10/05/2019 1030   MONOABS 0.3 04/14/2016 1326   EOSABS 0.0 10/05/2019 1030   EOSABS 0.1 04/14/2016 1326   BASOSABS 0.0 10/05/2019 1030   BASOSABS 0.0 04/14/2016 1326   CMP Latest Ref Rng & Units 08/04/2019 08/01/2019 06/21/2019  Glucose 70 - 99 mg/dL 118(H) 109(H) 112(H)  BUN 6 - 20 mg/dL 15 13 16   Creatinine 0.44 - 1.00 mg/dL 1.32(H) 1.16(H) 1.10(H)  Sodium 135 - 145 mmol/L 135 135 138  Potassium 3.5 - 5.1 mmol/L 4.0 4.1 4.0  Chloride 98 - 111 mmol/L 102 101 104  CO2 22 - 32 mmol/L 24 23 27   Calcium 8.9 - 10.3 mg/dL 8.7(L) 9.3 9.3  Total Protein 6.5 - 8.1 g/dL - 7.7 7.2  Total Bilirubin 0.3 - 1.2 mg/dL - 2.0(H) 1.9(H)  Alkaline Phos 38 - 126 U/L - 105 -  AST 15 - 41 U/L - 49(H) 41(H)  ALT 0 - 44 U/L - 22 20       DIAGNOSTIC  IMAGING:  I have independently reviewed the scans and discussed with the patient.   I have reviewed Venita Lick LPN's note and agree with the documentation.  I personally performed a face-to-face visit, made revisions and my assessment and plan is as follows.    ASSESSMENT & PLAN:   Other pancytopenia 1.  Pancytopenia: -CT scan on 07/06/2019 shows normal spleen.  Does not report any recurrent infections.  No B symptoms. -She had decreased platelet count and white count since 2012. -Bone marrow biopsy on 08/31/2019 showed mildly hypocellular marrow with mild  erythroid hyperplasia.  No significant dysplasia.  Aspirate was spicular and cellular.  Mild reactive increase in erythroid precursors.  Mild reactive decrease in granulocytic precursors.  Megakaryocytes are present and morphologically unremarkable.  MDS FISH panel was negative. -She is wanting to have left hip surgery for avascular necrosis. -We have given a trial of pulse dexamethasone 40 mg for 4 days on 09/27/2019. -Platelet count improved to 90 from 56.  White count improved to 5.7 from 2.2.  Hemoglobin also improved to 11.3. -This indicates autoimmune etiology for her pancytopenia. -She may proceed with her left hip surgery.  We will optimize her platelet count prior to surgery.  2.  Elevated ferritin levels: -This is from history of hep C and cirrhosis.  Also has history of alcohol abuse, quit completely 8 years ago. -CTAP on 07/06/2019 shows nodular contour of the liver with cirrhosis.  Spleen is normal.  Significant varices present.  No significant ascites. -Hemochromatosis labs were negative.  Elevated ferritin likely secondary to hep C and liver disease.  3.  Smoking history: -Current active smoker, smoked half to 1 pack/day for 36 years. -We have referred her to lung cancer screening program.  Total time spent is 25 minutes with more than 50% of the time spent face-to-face discussing lab results, counseling and coordination of care.    Orders placed this encounter:  Orders Placed This Encounter  Procedures  . CBC with Differential      Derek Jack, MD [place] [phone]

## 2019-10-06 NOTE — Assessment & Plan Note (Signed)
1.  Pancytopenia: -CT scan on 07/06/2019 shows normal spleen.  Does not report any recurrent infections.  No B symptoms. -She had decreased platelet count and white count since 2012. -Bone marrow biopsy on 08/31/2019 showed mildly hypocellular marrow with mild erythroid hyperplasia.  No significant dysplasia.  Aspirate was spicular and cellular.  Mild reactive increase in erythroid precursors.  Mild reactive decrease in granulocytic precursors.  Megakaryocytes are present and morphologically unremarkable.  MDS FISH panel was negative. -She is wanting to have left hip surgery for avascular necrosis. -We have given a trial of pulse dexamethasone 40 mg for 4 days on 09/27/2019. -Platelet count improved to 90 from 56.  White count improved to 5.7 from 2.2.  Hemoglobin also improved to 11.3. -This indicates autoimmune etiology for her pancytopenia. -She may proceed with her left hip surgery.  We will optimize her platelet count prior to surgery.  2.  Elevated ferritin levels: -This is from history of hep C and cirrhosis.  Also has history of alcohol abuse, quit completely 8 years ago. -CTAP on 07/06/2019 shows nodular contour of the liver with cirrhosis.  Spleen is normal.  Significant varices present.  No significant ascites. -Hemochromatosis labs were negative.  Elevated ferritin likely secondary to hep C and liver disease.  3.  Smoking history: -Current active smoker, smoked half to 1 pack/day for 36 years. -We have referred her to lung cancer screening program.

## 2019-10-07 LAB — MISC LABCORP TEST (SEND OUT)
LabCorp test name: 451953
Labcorp test code: 451953

## 2019-10-09 DIAGNOSIS — I10 Essential (primary) hypertension: Secondary | ICD-10-CM | POA: Diagnosis not present

## 2019-10-09 DIAGNOSIS — Z20828 Contact with and (suspected) exposure to other viral communicable diseases: Secondary | ICD-10-CM | POA: Diagnosis not present

## 2019-10-09 DIAGNOSIS — F17200 Nicotine dependence, unspecified, uncomplicated: Secondary | ICD-10-CM | POA: Diagnosis not present

## 2019-10-09 DIAGNOSIS — Z79899 Other long term (current) drug therapy: Secondary | ICD-10-CM | POA: Diagnosis not present

## 2019-10-09 DIAGNOSIS — Z833 Family history of diabetes mellitus: Secondary | ICD-10-CM | POA: Diagnosis not present

## 2019-10-09 DIAGNOSIS — Z720 Tobacco use: Secondary | ICD-10-CM | POA: Diagnosis not present

## 2019-10-09 DIAGNOSIS — E119 Type 2 diabetes mellitus without complications: Secondary | ICD-10-CM | POA: Diagnosis not present

## 2019-10-09 DIAGNOSIS — J029 Acute pharyngitis, unspecified: Secondary | ICD-10-CM | POA: Diagnosis not present

## 2019-10-09 DIAGNOSIS — Z8249 Family history of ischemic heart disease and other diseases of the circulatory system: Secondary | ICD-10-CM | POA: Diagnosis not present

## 2019-10-09 DIAGNOSIS — Z888 Allergy status to other drugs, medicaments and biological substances status: Secondary | ICD-10-CM | POA: Diagnosis not present

## 2019-10-11 DIAGNOSIS — K219 Gastro-esophageal reflux disease without esophagitis: Secondary | ICD-10-CM | POA: Diagnosis not present

## 2019-10-11 DIAGNOSIS — M25552 Pain in left hip: Secondary | ICD-10-CM | POA: Diagnosis not present

## 2019-10-17 ENCOUNTER — Other Ambulatory Visit: Payer: Self-pay

## 2019-10-18 ENCOUNTER — Encounter (HOSPITAL_COMMUNITY): Payer: Self-pay | Admitting: *Deleted

## 2019-10-18 ENCOUNTER — Ambulatory Visit (HOSPITAL_COMMUNITY): Payer: Medicare Other

## 2019-10-18 ENCOUNTER — Ambulatory Visit (HOSPITAL_COMMUNITY): Payer: Medicare Other | Admitting: Hematology

## 2019-10-18 NOTE — Progress Notes (Signed)
Patient had to reschedule her LDCT due to having car trouble.  She is having hip replacement surgery early November so she wishes to wait until the end of November for reschedule.  Her appointment date and time was given to her and she agrees.

## 2019-10-19 ENCOUNTER — Telehealth: Payer: Self-pay | Admitting: *Deleted

## 2019-10-19 NOTE — Telephone Encounter (Signed)
Called patient. She stated she cancelled her cardiology appt because she has upcoming surgery. She asked to r/s OV here as well. She is now scheduled in December. Patient aware needs to see cardiology before EGD can be done. She voiced understanding.

## 2019-10-19 NOTE — Telephone Encounter (Signed)
She needs cardiac evaluation due to exertional chest pain and SOB. Did she have this done elsewhere? I see she is undergoing a surgery early Nov. (total hip arthroplasty).

## 2019-10-19 NOTE — Telephone Encounter (Signed)
Patient was referred to see cardiology at last OV for cardiac clearance prior to EGD being done. Referral placed. Per notes, message left to call back to schedule appt. No appt scheduled. We have a slot held for patient on 11/23 for procedure. Patient scheduled for OV here 11/01/2019. AB, please advise if we need to push back EGD? thanks

## 2019-10-20 NOTE — Patient Instructions (Addendum)
DUE TO COVID-19 ONLY ONE VISITOR IS ALLOWED TO COME WITH YOU AND STAY IN THE WAITING ROOM ONLY DURING PRE OP AND PROCEDURE DAY OF SURGERY. THE 1 VISITOR MAY VISIT WITH YOU AFTER SURGERY IN YOUR PRIVATE ROOM DURING VISITING HOURS ONLY!  YOU NEED TO HAVE A COVID 19 TEST ON_Monday 10/24/2019 @ 10:50 AM. COME  TO [place]. ONCE YOUR COVID TEST IS COMPLETED, PLEASE BEGIN THE QUARANTINE INSTRUCTIONS AS OUTLINED IN YOUR HANDOUT.                Gabriella Leon    Your procedure is scheduled on: Thursday 10/27/2019   Report to [facility] Main  Entrance    Report to admitting at 7:35 AM     Call this number if you have problems the morning of surgery [phone]    Remember: Do not eat food after Midnight.   BRUSH YOUR TEETH MORNING OF SURGERY AND RINSE YOUR MOUTH OUT, NO CHEWING GUM CANDY OR MINTS.  NO SOLID FOOD AFTER MIDNIGHT THE NIGHT PRIOR TO SURGERY.   NOTHING BY MOUTH EXCEPT CLEAR LIQUIDS UNTIL 7:05am .   PLEASE FINISH G2 DRINK PER SURGEON ORDER  WHICH NEEDS TO BE COMPLETED AT 7:05am.   CLEAR LIQUID DIET   Foods Allowed                                                                     Foods Excluded  Coffee and tea, regular and decaf                             liquids that you cannot  Plain Jell-O any favor except red or purple             see through such as: Fruit ices (not with fruit pulp)                                     milk, soups, orange juice  Iced Popsicles                                    All solid food Carbonated beverages, regular and diet                                    Cranberry, grape and apple juices Sports drinks like Gatorade Lightly seasoned clear broth or consume(fat free) Sugar, honey syrup  Sample Menu Breakfast                                Lunch                                     Supper Cranberry juice                    Beef broth  Chicken broth Jell-O                                      Grape juice                           Apple juice Coffee or tea                        Jell-O                                      Popsicle                                                Coffee or tea                        Coffee or tea  _____________________________________________________________________  How to Manage Your Diabetes Before and After Surgery  Why is it important to control my blood sugar before and after surgery? . Improving blood sugar levels before and after surgery helps healing and can limit problems. . A way of improving blood sugar control is eating a healthy diet by: o  Eating less sugar and carbohydrates o  Increasing activity/exercise o  Talking with your doctor about reaching your blood sugar goals . High blood sugars (greater than 180 mg/dL) can raise your risk of infections and slow your recovery, so you will need to focus on controlling your diabetes during the weeks before surgery. . Make sure that the doctor who takes care of your diabetes knows about your planned surgery including the date and location.  How do I manage my blood sugar before surgery? . Check your blood sugar at least 4 times a day, starting 2 days before surgery, to make sure that the level is not too high or low. o Check your blood sugar the morning of your surgery when you wake up and every 2 hours until you get to the Short Stay unit. . If your blood sugar is less than 70 mg/dL, you will need to treat for low blood sugar: o Do not take insulin. o Treat a low blood sugar (less than 70 mg/dL) with  cup of clear juice (cranberry or apple), 4 glucose tablets, OR glucose gel. o Recheck blood sugar in 15 minutes after treatment (to make sure it is greater than 70 mg/dL). If your blood sugar is not greater than 70 mg/dL on recheck, call [phone] for further instructions. . Report your blood sugar to the short stay nurse when you get to Short Stay.  . If you are admitted to the  hospital after surgery: o Your blood sugar will be checked by the staff and you will probably be given insulin after surgery (instead of oral diabetes medicines) to make sure you have good blood sugar levels. o The goal for blood sugar control after surgery is 80-180 mg/dL.   WHAT DO I DO ABOUT MY DIABETES MEDICATION?  [name] Do not take oral diabetes medicines (pills) the morning of surgery.  . THE DAY BEFORE SURGERY Take only your AM Dose of  Glimepiride (Amaryl) if needed, No afternoon or evening doses. Take Breakfast dose of Metformin     Take these medicines the morning of surgery with A SIP OF WATER: Nadolol (Corgard), Famotidine (Pepcid), Omeprazole (Prilosec)    DO NOT TAKE ANY DIABETIC MEDICATIONS DAY OF YOUR SURGERY                               You may not have any metal on your body including hair pins and              piercings  Do not wear jewelry, make-up, lotions, powders or perfumes, deodorant             Do not wear nail polish on your fingernails.  Do not shave  48 hours prior to surgery.                Do not bring valuables to the hospital. [place].  Contacts, dentures or bridgework may not be worn into surgery.  Leave suitcase in the car. After surgery it may be brought to your room.                 Please read over the following fact sheets you were given: _____________________________________________________________________             [facility] - Preparing for Surgery Before surgery, you can play an important role.  Because skin is not sterile, your skin needs to be as free of germs as possible.  You can reduce the number of germs on your skin by washing with CHG (chlorahexidine gluconate) soap before surgery.  CHG is an antiseptic cleaner which kills germs and bonds with the skin to continue killing germs even after washing. Please DO NOT use if you have an allergy to CHG or antibacterial soaps.  If your skin  becomes reddened/irritated stop using the CHG and inform your nurse when you arrive at Short Stay. Do not shave (including legs and underarms) for at least 48 hours prior to the first CHG shower.  You may shave your face/neck. Please follow these instructions carefully:  1.  Shower with CHG Soap the night before surgery and the  morning of Surgery.  2.  If you choose to wash your hair, wash your hair first as usual with your  normal  shampoo.  3.  After you shampoo, rinse your hair and body thoroughly to remove the  shampoo.                            4.  Use CHG as you would any other liquid soap.  You can apply chg directly  to the skin and wash                       Gently with a scrungie or clean washcloth.  5.  Apply the CHG Soap to your body ONLY FROM THE NECK DOWN.   Do not use on face/ open                           Wound or open sores. Avoid contact with eyes, ears mouth and genitals (private parts).  Wash face,  Genitals (private parts) with your normal soap.             6.  Wash thoroughly, paying special attention to the area where your surgery  will be performed.  7.  Thoroughly rinse your body with warm water from the neck down.  8.  DO NOT shower/wash with your normal soap after using and rinsing off  the CHG Soap.                9.  Pat yourself dry with a clean towel.            10.  Wear clean pajamas.            11.  Place clean sheets on your bed the night of your first shower and do not  sleep with pets. Day of Surgery : Do not apply any lotions/deodorants the morning of surgery.  Please wear clean clothes to the hospital/surgery center.  FAILURE TO FOLLOW THESE INSTRUCTIONS MAY RESULT IN THE CANCELLATION OF YOUR SURGERY PATIENT SIGNATURE_________________________________  NURSE SIGNATURE__________________________________  ________________________________________________________________________   Adam Phenix  An incentive spirometer is a  tool that can help keep your lungs clear and active. This tool measures how well you are filling your lungs with each breath. Taking long deep breaths may help reverse or decrease the chance of developing breathing (pulmonary) problems (especially infection) following:  A long period of time when you are unable to move or be active. BEFORE THE PROCEDURE   If the spirometer includes an indicator to show your best effort, your nurse or respiratory therapist will set it to a desired goal.  If possible, sit up straight or lean slightly forward. Try not to slouch.  Hold the incentive spirometer in an upright position. INSTRUCTIONS FOR USE  1. Sit on the edge of your bed if possible, or sit up as far as you can in bed or on a chair. 2. Hold the incentive spirometer in an upright position. 3. Breathe out normally. 4. Place the mouthpiece in your mouth and seal your lips tightly around it. 5. Breathe in slowly and as deeply as possible, raising the piston or the ball toward the top of the column. 6. Hold your breath for 3-5 seconds or for as long as possible. Allow the piston or ball to fall to the bottom of the column. 7. Remove the mouthpiece from your mouth and breathe out normally. 8. Rest for a few seconds and repeat Steps 1 through 7 at least 10 times every 1-2 hours when you are awake. Take your time and take a few normal breaths between deep breaths. 9. The spirometer may include an indicator to show your best effort. Use the indicator as a goal to work toward during each repetition. 10. After each set of 10 deep breaths, practice coughing to be sure your lungs are clear. If you have an incision (the cut made at the time of surgery), support your incision when coughing by placing a pillow or rolled up towels firmly against it. Once you are able to get out of bed, walk around indoors and cough well. You may stop using the incentive spirometer when instructed by your caregiver.  RISKS AND  COMPLICATIONS  Take your time so you do not get dizzy or light-headed.  If you are in pain, you may need to take or ask for pain medication before doing incentive spirometry. It is harder to take a deep breath if you are having  pain. AFTER USE  Rest and breathe slowly and easily.  It can be helpful to keep track of a log of your progress. Your caregiver can provide you with a simple table to help with this. If you are using the spirometer at home, follow these instructions: [place] IF:   You are having difficultly using the spirometer.  You have trouble using the spirometer as often as instructed.  Your pain medication is not giving enough relief while using the spirometer.  You develop fever of 100.5 F (38.1 C) or higher. SEEK IMMEDIATE MEDICAL CARE IF:   You cough up bloody sputum that had not been present before.  You develop fever of 102 F (38.9 C) or greater.  You develop worsening pain at or near the incision site. MAKE SURE YOU:   Understand these instructions.  Will watch your condition.  Will get help right away if you are not doing well or get worse. Document Released: 04/20/2007 Document Revised: 03/01/2012 Document Reviewed: 06/21/2007 ExitCare Patient Information 2014 ExitCare, [place].   ________________________________________________________________________  WHAT IS A BLOOD TRANSFUSION? Blood Transfusion Information  A transfusion is the replacement of blood or some of its parts. Blood is made up of multiple cells which provide different functions.  Red blood cells carry oxygen and are used for blood loss replacement.  White blood cells fight against infection.  Platelets control bleeding.  Plasma helps clot blood.  Other blood products are available for specialized needs, such as hemophilia or other clotting disorders. BEFORE THE TRANSFUSION  Who gives blood for transfusions?   Healthy volunteers who are fully evaluated to make sure  their blood is safe. This is blood bank blood. Transfusion therapy is the safest it has ever been in the practice of medicine. Before blood is taken from a donor, a complete history is taken to make sure that person has no history of diseases nor engages in risky social behavior (examples are intravenous drug use or sexual activity with multiple partners). The donor's travel history is screened to minimize risk of transmitting infections, such as malaria. The donated blood is tested for signs of infectious diseases, such as HIV and hepatitis. The blood is then tested to be sure it is compatible with you in order to minimize the chance of a transfusion reaction. If you or a relative donates blood, this is often done in anticipation of surgery and is not appropriate for emergency situations. It takes many days to process the donated blood. RISKS AND COMPLICATIONS Although transfusion therapy is very safe and saves many lives, the main dangers of transfusion include:   Getting an infectious disease.  Developing a transfusion reaction. This is an allergic reaction to something in the blood you were given. Every precaution is taken to prevent this. The decision to have a blood transfusion has been considered carefully by your caregiver before blood is given. Blood is not given unless the benefits outweigh the risks. AFTER THE TRANSFUSION  Right after receiving a blood transfusion, you will usually feel much better and more energetic. This is especially true if your red blood cells have gotten low (anemic). The transfusion raises the level of the red blood cells which carry oxygen, and this usually causes an energy increase.  The nurse administering the transfusion will monitor you carefully for complications. HOME CARE INSTRUCTIONS  No special instructions are needed after a transfusion. You may find your energy is better. Speak with your caregiver about any limitations on activity for underlying diseases  you may have. SEEK MEDICAL CARE IF:   Your condition is not improving after your transfusion.  You develop redness or irritation at the intravenous (IV) site. SEEK IMMEDIATE MEDICAL CARE IF:  Any of the following symptoms occur over the next 12 hours:  Shaking chills.  You have a temperature by mouth above 102 F (38.9 C), not controlled by medicine.  Chest, back, or muscle pain.  People around you feel you are not acting correctly or are confused.  Shortness of breath or difficulty breathing.  Dizziness and fainting.  You get a rash or develop hives.  You have a decrease in urine output.  Your urine turns a dark color or changes to pink, red, or brown. Any of the following symptoms occur over the next 10 days:  You have a temperature by mouth above 102 F (38.9 C), not controlled by medicine.  Shortness of breath.  Weakness after normal activity.  The white part of the eye turns yellow (jaundice).  You have a decrease in the amount of urine or are urinating less often.  Your urine turns a dark color or changes to pink, red, or brown. Document Released: 12/05/2000 Document Revised: 03/01/2012 Document Reviewed: 07/24/2008 [facility] Patient Information 2014 [place], [place].  _______________________________________________________________________

## 2019-10-21 ENCOUNTER — Other Ambulatory Visit: Payer: Self-pay

## 2019-10-21 ENCOUNTER — Encounter (HOSPITAL_COMMUNITY): Payer: Self-pay

## 2019-10-21 ENCOUNTER — Encounter (HOSPITAL_COMMUNITY): Payer: Self-pay | Admitting: Physician Assistant

## 2019-10-21 ENCOUNTER — Encounter (HOSPITAL_COMMUNITY)
Admission: RE | Admit: 2019-10-21 | Discharge: 2019-10-21 | Disposition: A | Discharge status: Home / Self Care | Payer: Medicare Other | Source: Ambulatory Visit | Attending: Orthopedic Surgery | Admitting: Orthopedic Surgery

## 2019-10-21 DIAGNOSIS — I129 Hypertensive chronic kidney disease with stage 1 through stage 4 chronic kidney disease, or unspecified chronic kidney disease: Secondary | ICD-10-CM | POA: Diagnosis not present

## 2019-10-21 DIAGNOSIS — B1910 Unspecified viral hepatitis B without hepatic coma: Secondary | ICD-10-CM | POA: Insufficient documentation

## 2019-10-21 DIAGNOSIS — N189 Chronic kidney disease, unspecified: Secondary | ICD-10-CM | POA: Diagnosis not present

## 2019-10-21 DIAGNOSIS — K7460 Unspecified cirrhosis of liver: Secondary | ICD-10-CM | POA: Diagnosis not present

## 2019-10-21 DIAGNOSIS — F17210 Nicotine dependence, cigarettes, uncomplicated: Secondary | ICD-10-CM | POA: Insufficient documentation

## 2019-10-21 DIAGNOSIS — M1612 Unilateral primary osteoarthritis, left hip: Secondary | ICD-10-CM | POA: Insufficient documentation

## 2019-10-21 DIAGNOSIS — J45909 Unspecified asthma, uncomplicated: Secondary | ICD-10-CM | POA: Diagnosis not present

## 2019-10-21 DIAGNOSIS — Z01812 Encounter for preprocedural laboratory examination: Secondary | ICD-10-CM | POA: Diagnosis not present

## 2019-10-21 DIAGNOSIS — E1122 Type 2 diabetes mellitus with diabetic chronic kidney disease: Secondary | ICD-10-CM | POA: Insufficient documentation

## 2019-10-21 DIAGNOSIS — B1920 Unspecified viral hepatitis C without hepatic coma: Secondary | ICD-10-CM | POA: Diagnosis not present

## 2019-10-21 DIAGNOSIS — Z791 Long term (current) use of non-steroidal anti-inflammatories (NSAID): Secondary | ICD-10-CM | POA: Diagnosis not present

## 2019-10-21 DIAGNOSIS — Z79899 Other long term (current) drug therapy: Secondary | ICD-10-CM | POA: Insufficient documentation

## 2019-10-21 DIAGNOSIS — Z7984 Long term (current) use of oral hypoglycemic drugs: Secondary | ICD-10-CM | POA: Diagnosis not present

## 2019-10-21 DIAGNOSIS — K219 Gastro-esophageal reflux disease without esophagitis: Secondary | ICD-10-CM | POA: Insufficient documentation

## 2019-10-21 LAB — COMPREHENSIVE METABOLIC PANEL WITH GFR
ALT: 37 U/L (ref 0–44)
AST: 54 U/L — ABNORMAL HIGH (ref 15–41)
Albumin: 3.1 g/dL — ABNORMAL LOW (ref 3.5–5.0)
Alkaline Phosphatase: 133 U/L — ABNORMAL HIGH (ref 38–126)
Anion gap: 10 (ref 5–15)
BUN: 18 mg/dL (ref 8–23)
CO2: 22 mmol/L (ref 22–32)
Calcium: 8.9 mg/dL (ref 8.9–10.3)
Chloride: 106 mmol/L (ref 98–111)
Creatinine, Ser: 1.31 mg/dL — ABNORMAL HIGH (ref 0.44–1.00)
GFR calc Af Amer: 51 mL/min — ABNORMAL LOW
GFR calc non Af Amer: 44 mL/min — ABNORMAL LOW
Glucose, Bld: 144 mg/dL — ABNORMAL HIGH (ref 70–99)
Potassium: 4.3 mmol/L (ref 3.5–5.1)
Sodium: 138 mmol/L (ref 135–145)
Total Bilirubin: 1.3 mg/dL — ABNORMAL HIGH (ref 0.3–1.2)
Total Protein: 6.7 g/dL (ref 6.5–8.1)

## 2019-10-21 LAB — CBC
HCT: 32.7 % — ABNORMAL LOW (ref 36.0–46.0)
Hemoglobin: 10.8 g/dL — ABNORMAL LOW (ref 12.0–15.0)
MCH: 36.4 pg — ABNORMAL HIGH (ref 26.0–34.0)
MCHC: 33 g/dL (ref 30.0–36.0)
MCV: 110.1 fL — ABNORMAL HIGH (ref 80.0–100.0)
Platelets: 47 10*3/uL — ABNORMAL LOW (ref 150–400)
RBC: 2.97 MIL/uL — ABNORMAL LOW (ref 3.87–5.11)
RDW: 13.4 % (ref 11.5–15.5)
WBC: 2.7 10*3/uL — ABNORMAL LOW (ref 4.0–10.5)
nRBC: 0 % (ref 0.0–0.2)

## 2019-10-21 LAB — HEMOGLOBIN A1C
Hgb A1c MFr Bld: 4.8 % (ref 4.8–5.6)
Mean Plasma Glucose: 91.06 mg/dL

## 2019-10-21 LAB — SURGICAL PCR SCREEN
MRSA, PCR: POSITIVE — AB
Staphylococcus aureus: POSITIVE — AB

## 2019-10-21 LAB — ABO/RH: ABO/RH(D): O NEG

## 2019-10-21 LAB — GLUCOSE, CAPILLARY: Glucose-Capillary: 138 mg/dL — ABNORMAL HIGH (ref 70–99)

## 2019-10-21 NOTE — Progress Notes (Signed)
PCR results routed to Dr. Alvan Dame for review

## 2019-10-24 ENCOUNTER — Other Ambulatory Visit (HOSPITAL_COMMUNITY)
Admission: RE | Admit: 2019-10-24 | Discharge: 2019-10-24 | Disposition: A | Discharge status: Home / Self Care | Payer: Medicare Other | Source: Ambulatory Visit | Attending: Orthopedic Surgery | Admitting: Orthopedic Surgery

## 2019-10-24 ENCOUNTER — Telehealth (HOSPITAL_COMMUNITY): Payer: Self-pay | Admitting: *Deleted

## 2019-10-24 ENCOUNTER — Other Ambulatory Visit: Payer: Self-pay

## 2019-10-24 DIAGNOSIS — Z01812 Encounter for preprocedural laboratory examination: Secondary | ICD-10-CM | POA: Insufficient documentation

## 2019-10-24 DIAGNOSIS — Z20828 Contact with and (suspected) exposure to other viral communicable diseases: Secondary | ICD-10-CM | POA: Diagnosis not present

## 2019-10-24 LAB — SARS CORONAVIRUS 2 (TAT 6-24 HRS): SARS Coronavirus 2: NEGATIVE

## 2019-10-24 NOTE — Telephone Encounter (Signed)
Jessica,PA from [place] called into clinic and stated the pt is scheduled for a hip replacement on 10/27/19. PA stated pt's platelets were 47 and wanted to know did the provider still would the pt to have the surgery.PA also wanted to know  will the provider put measures in place to bring up pt's low platelet count.Provider made aware and stated she would call Janett Billow, [place] to get more information.

## 2019-10-24 NOTE — Progress Notes (Signed)
Anesthesia Chart Review   Case: [id number] Date/Time: 10/27/19 0950   Procedure: TOTAL HIP ARTHROPLASTY ANTERIOR APPROACH (Left Hip) - 70 mins   Anesthesia type: Spinal   Pre-op diagnosis: Left hip osteoarthritis   [facility] ROOM 09 / [facility]   Surgeon: [name], MD      DISCUSSION:63 y.o. current every day smoker (10 pack years) with h/o DM II, HTN, Hepatitis B, Hepatitis C s/p treatment, cirrhosis, pancytopenia, history of alcoholism, GERD, asthma, left hip OA scheduled for above procedure 10/27/2019 with Dr. Norva Pavlov.   Pt scheduled for endoscopy 08/04/2019 which was cancelled due to chest pain.  She was seen in the ED.  Per ED note, "Here patient did be well-appearing with normal vital signs.  EKG without acute ischemic changes, serial troponin within normal limits, no delta troponin.  Given description of symptoms and above work-up, low suspicion for ACS.  Chest x-ray without acute pulmonary pathology.  No significant shortness of breath, no hypoxia or tachypnea, doubt pulmonary embolism.  More likely etiology related to gastritis, reflux for esophageal spasm.  Recommend patient follow-up with her GI doctor as well as her primary care doctor."  Per OV note with GI 08/30/2019 pt still experiencing intermittent chest pain worsened by exertion.  She was referred to cardiologist.  Pt scheduled to see cardiologist 10/25/2019, but cancelled appointment.  Pt will need cardiac evaluation/clearance.   Pt last seen by hematologist, Dr. Delton Coombes, 10/06/2019.  Per OV note, "She is wanting to have left hip surgery for avascular necrosis. -We have given a trial of pulse dexamethasone 40 mg for 4 days on 09/27/2019. -Platelet count improved to 90 from 56.  White count improved to 5.7 from 2.2.  Hemoglobin also improved to 11.3. -This indicates autoimmune etiology for her pancytopenia. -She may proceed with her left hip surgery.  We will optimize her platelet count prior to surgery."    Platelets 47  at PAT visit 10/21/2019.  Hematology and Dr. Alvan Dame informed.   VS: BP (!) 161/83   Pulse (!) 56   Temp 37.1 C (Oral)   Resp 18   Ht 5\' 4"  (1.626 m)   Wt 78.4 kg   SpO2 96%   BMI 29.66 kg/m   PROVIDERS: Neale Burly, MD is PCP   Derek Jack, MD Hematologist   LABS: Labs reviewed: Acceptable for surgery. (all labs ordered are listed, but only abnormal results are displayed)  Labs Reviewed  SURGICAL PCR SCREEN - Abnormal; Notable for the following components:      Result Value   MRSA, PCR POSITIVE (*)    Staphylococcus aureus POSITIVE (*)    All other components within normal limits  CBC - Abnormal; Notable for the following components:   WBC 2.7 (*)    RBC 2.97 (*)    Hemoglobin 10.8 (*)    HCT 32.7 (*)    MCV 110.1 (*)    MCH 36.4 (*)    Platelets 47 (*)    All other components within normal limits  GLUCOSE, CAPILLARY - Abnormal; Notable for the following components:   Glucose-Capillary 138 (*)    All other components within normal limits  COMPREHENSIVE METABOLIC PANEL - Abnormal; Notable for the following components:   Glucose, Bld 144 (*)    Creatinine, Ser 1.31 (*)    Albumin 3.1 (*)    AST 54 (*)    Alkaline Phosphatase 133 (*)    Total Bilirubin 1.3 (*)    GFR calc non Af Wyvonnia Lora  44 (*)    GFR calc Af Amer 51 (*)    All other components within normal limits  HEMOGLOBIN A1C  TYPE AND SCREEN  ABO/RH     IMAGES: Chest Xray 08/04/2019 IMPRESSION: No active cardiopulmonary disease.  EKG: 08/05/2019 Rate 51 bpm Sinus rhythm  Probable anteroseptal infarct, old   CV:  Past Medical History:  Diagnosis Date  . Acid reflux disease with ulcer   . Anxiety   . Arthritis   . Asthma   . Blood dyscrasia    thrombocytopenia-next tx injection 09/25/15  . Blood transfusion   . Chronic kidney disease   . Cirrhosis ([place])   . DDD (degenerative disc disease), lumbar   . Depression   . Diabetes mellitus   . Esophageal varices ([place]) 09/18/2014  . Folate  deficiency 03/28/2014   Noted on 03/23/2014.  Folate 1 mg prescribed.  . Gallstones   . GERD (gastroesophageal reflux disease)   . Headache    migraines  . Helicobacter pylori ab+    July 2010, s/p treatment  . Hepatitis B antibody positive   . Hepatitis C    and B positive antibody  . History of alcoholism ([place])   . History of kidney stones   . Hypertension   . Left leg pain   . Liver cirrhosis ([place])   . Low back pain   . Other pancytopenia ([place]) 10/27/2013  . Ulcer    ?    Past Surgical History:  Procedure Laterality Date  . ANTERIOR CERVICAL DECOMP/DISCECTOMY FUSION N/A 09/28/2015   Procedure: Cervical three-four Anterior cervical decompression/diskectomy/fusion;  Surgeon: Leeroy Cha, MD;  Location: [facility];  Service: Neurosurgery;  Laterality: N/A;  C3-4 Anterior cervical decompression/diskectomy/fusion  . APPENDECTOMY     age 59  . BIOPSY  12/18/2011      . BIOPSY  05/19/2019   Procedure: BIOPSY;  Surgeon: Daneil Dolin, MD;  [facility];  Service: Endoscopy;;  gastric  . [place]  greater than 10 yrs   [place]  . COLONOSCOPY  12/18/11   minimal anal canal hemorrhoids, friable rectal and colonic mucosa, left-sided diverticulosis, repeat in 2022.   . ESOPHAGOGASTRODUODENOSCOPY  12/18/11   3 columns of Grade II esophageal varices, reflux esohpagitis, portal gastropathy, path with chronic gastritis, negative H.pylori, surveillance in June 2014  . ESOPHAGOGASTRODUODENOSCOPY (EGD) WITH PROPOFOL N/A 04/13/2014   Dr. [name]: grade 1-2 varices, hiatal hernia, portal gastropathy. NO need for further surveillance while on prophylaxis unless evidence of bleeding  . ESOPHAGOGASTRODUODENOSCOPY (EGD) WITH PROPOFOL N/A 05/19/2019   erosive reflux esophagitis, Grade 1-2 esophageal varices with patulous EG junction. Hiatal hernia. Portal gastropathy. Gastric and duodenal dulcer disease.   . MULTIPLE EXTRACTIONS WITH ALVEOLOPLASTY   03/08/2012   Procedure: MULTIPLE EXTRACION WITH ALVEOLOPLASTY;  Surgeon: Gae Bon, DDS;  Location: [place];  Service: Oral Surgery;  Laterality: N/A;    MEDICATIONS: . dexamethasone (DECADRON) 4 MG tablet  . famotidine (PEPCID) 20 MG tablet  . furosemide (LASIX) 40 MG tablet  . glimepiride (AMARYL) 2 MG tablet  . HYDROcodone-acetaminophen (NORCO/VICODIN) 5-325 MG per tablet  . lisinopril-hydrochlorothiazide (PRINZIDE,ZESTORETIC) 10-12.5 MG per tablet  . meloxicam (MOBIC) 15 MG tablet  . metFORMIN (GLUCOPHAGE) 500 MG tablet  . nadolol (CORGARD) 40 MG tablet  . omeprazole (PRILOSEC) 40 MG capsule  . spironolactone (ALDACTONE) 25 MG tablet   No current facility-administered medications for this encounter.

## 2019-10-25 ENCOUNTER — Telehealth: Payer: Self-pay | Admitting: Internal Medicine

## 2019-10-25 NOTE — Telephone Encounter (Signed)
[phone] PATIENT WAS TOLD TO CALL HERE BECAUSE THEY CANCELLED HER HIP REPLACEMENT DUE TO LOW PLATELETS.  WANTED TO KNOW WHAT THE PHYSICIAN IS GOING TO DO TO INCREASE HER PLATELETS

## 2019-10-25 NOTE — Telephone Encounter (Signed)
Spoke with Dr. Aurea Graff office. Pt needs cardiac clearance and nothing is needed from our office in order for pt to have her procedure. Called pt back to inform her that she is in need of cardiac clearance from Dr. Aurea Graff office to have her procedure.

## 2019-10-27 ENCOUNTER — Encounter (HOSPITAL_COMMUNITY): Admission: RE | Payer: Self-pay | Source: Other Acute Inpatient Hospital

## 2019-10-27 ENCOUNTER — Inpatient Hospital Stay (HOSPITAL_COMMUNITY)
Admission: RE | Admit: 2019-10-27 | Payer: Medicare Other | Source: Other Acute Inpatient Hospital | Admitting: Orthopedic Surgery

## 2019-10-27 LAB — TYPE AND SCREEN
ABO/RH(D): O NEG
Antibody Screen: NEGATIVE

## 2019-10-27 SURGERY — ARTHROPLASTY, HIP, TOTAL, ANTERIOR APPROACH
Anesthesia: Spinal | Site: Hip | Laterality: Left

## 2019-11-01 ENCOUNTER — Ambulatory Visit: Payer: Medicare Other | Admitting: Gastroenterology

## 2019-11-07 ENCOUNTER — Ambulatory Visit (HOSPITAL_COMMUNITY): Payer: Medicare Other | Admitting: Hematology

## 2019-11-07 ENCOUNTER — Inpatient Hospital Stay (HOSPITAL_COMMUNITY): Payer: Medicare Other | Attending: Hematology

## 2019-11-15 ENCOUNTER — Inpatient Hospital Stay (HOSPITAL_COMMUNITY): Payer: Medicare Other | Admitting: Hematology

## 2019-11-15 ENCOUNTER — Ambulatory Visit (HOSPITAL_COMMUNITY): Admission: RE | Admit: 2019-11-15 | Payer: Medicare Other | Source: Ambulatory Visit

## 2019-11-15 NOTE — Patient Instructions (Signed)
You were seen today for a shared decision making visit and low-dose CT scan.

## 2019-11-16 ENCOUNTER — Encounter (HOSPITAL_COMMUNITY): Payer: Self-pay | Admitting: *Deleted

## 2019-11-22 ENCOUNTER — Other Ambulatory Visit: Payer: Self-pay | Admitting: Gastroenterology

## 2019-11-29 ENCOUNTER — Telehealth: Payer: Self-pay | Admitting: Neurology

## 2019-11-29 ENCOUNTER — Inpatient Hospital Stay (HOSPITAL_COMMUNITY): Payer: Medicare Other

## 2019-11-29 ENCOUNTER — Ambulatory Visit (HOSPITAL_COMMUNITY)
Admission: RE | Admit: 2019-11-29 | Discharge: 2019-11-29 | Disposition: A | Discharge status: Home / Self Care | Payer: Medicare Other | Source: Ambulatory Visit | Attending: Neurology | Admitting: Neurology

## 2019-11-29 ENCOUNTER — Other Ambulatory Visit: Payer: Self-pay

## 2019-11-29 ENCOUNTER — Ambulatory Visit: Payer: Medicare Other | Admitting: Gastroenterology

## 2019-11-29 ENCOUNTER — Inpatient Hospital Stay (HOSPITAL_COMMUNITY): Payer: Medicare Other | Attending: Hematology | Admitting: Hematology

## 2019-11-29 ENCOUNTER — Ambulatory Visit (HOSPITAL_COMMUNITY)
Admission: RE | Admit: 2019-11-29 | Discharge: 2019-11-29 | Disposition: A | Discharge status: Home / Self Care | Payer: Medicare Other | Source: Ambulatory Visit | Attending: Hematology | Admitting: Hematology

## 2019-11-29 DIAGNOSIS — Z122 Encounter for screening for malignant neoplasm of respiratory organs: Secondary | ICD-10-CM | POA: Insufficient documentation

## 2019-11-29 DIAGNOSIS — D693 Immune thrombocytopenic purpura: Secondary | ICD-10-CM

## 2019-11-29 DIAGNOSIS — R05 Cough: Secondary | ICD-10-CM | POA: Diagnosis present

## 2019-11-29 DIAGNOSIS — D61818 Other pancytopenia: Secondary | ICD-10-CM | POA: Diagnosis not present

## 2019-11-29 DIAGNOSIS — M25562 Pain in left knee: Secondary | ICD-10-CM | POA: Diagnosis not present

## 2019-11-29 DIAGNOSIS — F17210 Nicotine dependence, cigarettes, uncomplicated: Secondary | ICD-10-CM

## 2019-11-29 DIAGNOSIS — M25552 Pain in left hip: Secondary | ICD-10-CM

## 2019-11-29 DIAGNOSIS — G8929 Other chronic pain: Secondary | ICD-10-CM

## 2019-11-29 DIAGNOSIS — Z87891 Personal history of nicotine dependence: Secondary | ICD-10-CM

## 2019-11-29 DIAGNOSIS — M1612 Unilateral primary osteoarthritis, left hip: Secondary | ICD-10-CM | POA: Diagnosis not present

## 2019-11-29 DIAGNOSIS — Z720 Tobacco use: Secondary | ICD-10-CM

## 2019-11-29 LAB — CBC WITH DIFFERENTIAL/PLATELET
Abs Immature Granulocytes: 0.01 10*3/uL (ref 0.00–0.07)
Basophils Absolute: 0 10*3/uL (ref 0.0–0.1)
Basophils Relative: 2 %
Eosinophils Absolute: 0.1 10*3/uL (ref 0.0–0.5)
Eosinophils Relative: 5 %
HCT: 34.3 % — ABNORMAL LOW (ref 36.0–46.0)
Hemoglobin: 11.3 g/dL — ABNORMAL LOW (ref 12.0–15.0)
Immature Granulocytes: 0 %
Lymphocytes Relative: 29 %
Lymphs Abs: 0.8 10*3/uL (ref 0.7–4.0)
MCH: 35.9 pg — ABNORMAL HIGH (ref 26.0–34.0)
MCHC: 32.9 g/dL (ref 30.0–36.0)
MCV: 108.9 fL — ABNORMAL HIGH (ref 80.0–100.0)
Monocytes Absolute: 0.4 10*3/uL (ref 0.1–1.0)
Monocytes Relative: 15 %
Neutro Abs: 1.3 10*3/uL — ABNORMAL LOW (ref 1.7–7.7)
Neutrophils Relative %: 49 %
Platelets: 59 10*3/uL — ABNORMAL LOW (ref 150–400)
RBC: 3.15 MIL/uL — ABNORMAL LOW (ref 3.87–5.11)
RDW: 13.6 % (ref 11.5–15.5)
WBC: 2.7 10*3/uL — ABNORMAL LOW (ref 4.0–10.5)
nRBC: 0 % (ref 0.0–0.2)

## 2019-11-29 NOTE — Telephone Encounter (Signed)
DG HIP (WITH OR WITHOUT PELVIS) 2-3V LEFT  COMPARISON:  None.  FINDINGS: Sclerosis and partial collapse of the left superior femoral head. No fractures. Degenerative changes seen in the left hip. No other abnormalities.  IMPRESSION: 1. Findings suggest AVN with partial collapse of the left femoral head superiorly. Degenerative changes in the left hip.  Please call patient, x-ray of left hip showed partial collapse of the left femoral head superiorly, suggestive of avascular necrosis of left femoral head  X-ray of left knee showed mild degenerative changes  Will refer her to orthopedic surgeon for further evaluation  She has no showed EMG nerve conduction study twice, MRI of lumbar spine was denied by her insurance company

## 2019-11-29 NOTE — Telephone Encounter (Signed)
Patient verbalized understanding of her x-ray results.  She is agreeable to see orthopaedics.

## 2019-11-29 NOTE — Patient Instructions (Signed)
You were seen today for your shared decision making visit and a low-dose CT scan for lung cancer screening.    

## 2019-11-30 ENCOUNTER — Encounter (HOSPITAL_COMMUNITY): Payer: Self-pay | Admitting: *Deleted

## 2019-11-30 NOTE — Progress Notes (Unsigned)
Patient notified via mail of LDCT lung cancer screening results with recommendations to follow up in 12 months.  Also notified of incidental findings and need to follow up with PCP.  Patient was referred by our oncologist, he has been made aware of her results.    IMPRESSION: 1. Lung-RADS 2, benign appearance or behavior. Continue annual screening with low-dose chest CT without contrast in 12 months. 2. Emphysema and aortic atherosclerosis.  Aortic Atherosclerosis (ICD10-I70.0) and Emphysema (ICD10-J43.9).

## 2019-12-01 NOTE — Progress Notes (Signed)
[facility] Initial Visit:  Patient Care Team: Neale Burly, MD as PCP - General (Internal Medicine) [name] Cristopher Estimable, MD (Gastroenterology) Case, Reche Dixon, MD as Attending Physician (Orthopedic Surgery)  CHIEF COMPLAINTS/PURPOSE OF CONSULTATION: -Shared decision making for lung cancer screening    HISTORY OF PRESENTING ILLNESS: Gabriella Leon 63 y.o. female presents today for shared decision making regarding lung cancer screening.  Patient has an extensive past medical history.  She states she started smoking around age 57.  She currently smokes half a pack of cigarettes a day.  She reports chronic cough but denies any hemoptysis.  Cough is productive at times.  She denies any chest pain or shortness of breath.  Denies any extensive fatigue.  No change in bowel habits.  Appetite is stable.  No weight loss.  She has a family history significant for unknown cancer in her paternal uncle.  Review of Systems  Musculoskeletal: Positive for arthralgias, gait problem and myalgias.  Neurological: Positive for extremity weakness and gait problem.  All other systems reviewed and are negative.   MEDICAL HISTORY: Past Medical History:  Diagnosis Date  . Acid reflux disease with ulcer   . Anxiety   . Arthritis   . Asthma   . Blood dyscrasia    thrombocytopenia-next tx injection 09/25/15  . Blood transfusion   . Chronic kidney disease   . Cirrhosis ([place])   . DDD (degenerative disc disease), lumbar   . Depression   . Diabetes mellitus   . Esophageal varices ([place]) 09/18/2014  . Folate deficiency 03/28/2014   Noted on 03/23/2014.  Folate 1 mg prescribed.  . Gallstones   . GERD (gastroesophageal reflux disease)   . Headache    migraines  . Helicobacter pylori ab+    July 2010, s/p treatment  . Hepatitis B antibody positive   . Hepatitis C    and B positive antibody  . History of alcoholism ([place])   . History of kidney stones   . Hypertension   . Left leg pain    . Liver cirrhosis ([place])   . Low back pain   . Other pancytopenia ([place]) 10/27/2013  . Ulcer    ?    SURGICAL HISTORY: Past Surgical History:  Procedure Laterality Date  . ANTERIOR CERVICAL DECOMP/DISCECTOMY FUSION N/A 09/28/2015   Procedure: Cervical three-four Anterior cervical decompression/diskectomy/fusion;  Surgeon: Leeroy Cha, MD;  Location: [facility];  Service: Neurosurgery;  Laterality: N/A;  C3-4 Anterior cervical decompression/diskectomy/fusion  . APPENDECTOMY     age 12  . BIOPSY  12/18/2011      . BIOPSY  05/19/2019   Procedure: BIOPSY;  Surgeon: Daneil Dolin, MD;  [facility];  Service: Endoscopy;;  gastric  . [place]  greater than 10 yrs   [place]  . COLONOSCOPY  12/18/11   minimal anal canal hemorrhoids, friable rectal and colonic mucosa, left-sided diverticulosis, repeat in 2022.   . ESOPHAGOGASTRODUODENOSCOPY  12/18/11   3 columns of Grade II esophageal varices, reflux esohpagitis, portal gastropathy, path with chronic gastritis, negative H.pylori, surveillance in June 2014  . ESOPHAGOGASTRODUODENOSCOPY (EGD) WITH PROPOFOL N/A 04/13/2014   Dr. [name]: grade 1-2 varices, hiatal hernia, portal gastropathy. NO need for further surveillance while on prophylaxis unless evidence of bleeding  . ESOPHAGOGASTRODUODENOSCOPY (EGD) WITH PROPOFOL N/A 05/19/2019   erosive reflux esophagitis, Grade 1-2 esophageal varices with patulous EG junction. Hiatal hernia. Portal gastropathy. Gastric and duodenal dulcer  disease.   . MULTIPLE EXTRACTIONS WITH ALVEOLOPLASTY  03/08/2012   Procedure: MULTIPLE EXTRACION WITH ALVEOLOPLASTY;  Surgeon: Gae Bon, DDS;  Location: [facility];  Service: Oral Surgery;  Laterality: N/A;    SOCIAL HISTORY: Social History   Socioeconomic History  . Marital status: Legally Separated    Spouse name: Not on file  . Number of children: 1  . Years of education: 10th grade  . Highest education  level: Not on file  Occupational History  . Occupation: disabled    Fish farm manager: NOT EMPLOYED  Tobacco Use  . Smoking status: Current Every Day Smoker    Packs/day: 0.50    Years: 20.00    Pack years: 10.00    Types: Cigarettes  . Smokeless tobacco: Never Used  Substance and Sexual Activity  . Alcohol use: No    Comment: last use 2015  . Drug use: No  . Sexual activity: Yes    Birth control/protection: Post-menopausal  Other Topics Concern  . Not on file  Social History Narrative   Lives at home alone.   Right-handed.   Occasional caffeine use.   Social Determinants of Health   Financial Resource Strain:   . Difficulty of Paying Living Expenses: Not on file  Food Insecurity:   . Worried About Charity fundraiser in the Last Year: Not on file  . Ran Out of Food in the Last Year: Not on file  Transportation Needs:   . Lack of Transportation (Medical): Not on file  . Lack of Transportation (Non-Medical): Not on file  Physical Activity:   . Days of Exercise per Week: Not on file  . Minutes of Exercise per Session: Not on file  Stress:   . Feeling of Stress : Not on file  Social Connections:   . Frequency of Communication with Friends and Family: Not on file  . Frequency of Social Gatherings with Friends and Family: Not on file  . Attends Religious Services: Not on file  . Active Member of Clubs or Organizations: Not on file  . Attends Archivist Meetings: Not on file  . Marital Status: Not on file  Intimate Partner Violence:   . Fear of Current or Ex-Partner: Not on file  . Emotionally Abused: Not on file  . Physically Abused: Not on file  . Sexually Abused: Not on file    FAMILY HISTORY Family History  Problem Relation Age of Onset  . Diabetes Mother   . Heart disease Mother   . Cancer Paternal Uncle        passed away age 28  . Other Father        died in [place] in 62  . Heart disease Sister   . Anesthesia problems Neg Hx   . Hypotension Neg Hx   .  Malignant hyperthermia Neg Hx   . Pseudochol deficiency Neg Hx   . Colon cancer Neg Hx   . Liver disease Neg Hx     ALLERGIES:  has No Known Allergies.  MEDICATIONS:  Current Outpatient Medications  Medication Sig Dispense Refill  . dexamethasone (DECADRON) 4 MG tablet Take 10 tablets (40 mg total) by mouth daily. (Patient not taking: Reported on 10/20/2019) 40 tablet 0  . famotidine (PEPCID) 20 MG tablet Take 20 mg by mouth 2 (two) times daily.     . furosemide (LASIX) 40 MG tablet Take 40 mg by mouth daily.     [name] glimepiride (AMARYL) 2 MG tablet Take 2 mg by  mouth daily as needed (if blood sugar is >160).     [name] HYDROcodone-acetaminophen (NORCO/VICODIN) 5-325 MG per tablet Take 1 tablet by mouth every 6 (six) hours as needed for moderate pain.    [name] lisinopril-hydrochlorothiazide (PRINZIDE,ZESTORETIC) 10-12.5 MG per tablet Take 1 tablet by mouth daily.    . meloxicam (MOBIC) 15 MG tablet Take 15 mg by mouth daily.    . metFORMIN (GLUCOPHAGE) 500 MG tablet Take 500 mg by mouth daily with breakfast.     . nadolol (CORGARD) 40 MG tablet Take 40 mg by mouth daily.     [name] omeprazole (PRILOSEC) 40 MG capsule Take 40 mg by mouth daily as needed.    [name] spironolactone (ALDACTONE) 25 MG tablet TAKE 1 TABLET BY MOUTH DAILY 90 tablet 1   No current facility-administered medications for this visit.    PHYSICAL EXAMINATION:  ECOG PERFORMANCE STATUS: 1 - Symptomatic but completely ambulatory   There were no vitals filed for this visit.  There were no vitals filed for this visit.   Physical Exam Constitutional:      Appearance: Normal appearance.  HENT:     Head: Normocephalic.     Right Ear: External ear normal.     Left Ear: External ear normal.     Nose: Nose normal.     Mouth/Throat:     Pharynx: Oropharynx is clear.  Eyes:     Conjunctiva/sclera: Conjunctivae normal.  Cardiovascular:     Rate and Rhythm: Normal rate and regular rhythm.     Pulses: Normal pulses.     Heart  sounds: Normal heart sounds.  Pulmonary:     Effort: Pulmonary effort is normal.     Comments: Diminished breath sounds all 4 lobes Abdominal:     General: Bowel sounds are normal.  Musculoskeletal:        General: Normal range of motion.     Cervical back: Normal range of motion.  Skin:    General: Skin is warm.  Neurological:     General: No focal deficit present.     Mental Status: She is alert and oriented to person, place, and time.  Psychiatric:        Mood and Affect: Mood normal.        Behavior: Behavior normal.      LABORATORY DATA: I have personally reviewed the data as listed:  Appointment on 11/29/2019  Component Date Value Ref Range Status  . WBC 11/29/2019 2.7* 4.0 - 10.5 K/uL Final  . RBC 11/29/2019 3.15* 3.87 - 5.11 MIL/uL Final  . Hemoglobin 11/29/2019 11.3* 12.0 - 15.0 g/dL Final  . HCT 11/29/2019 34.3* 36.0 - 46.0 % Final  . MCV 11/29/2019 108.9* 80.0 - 100.0 fL Final  . MCH 11/29/2019 35.9* 26.0 - 34.0 pg Final  . MCHC 11/29/2019 32.9  30.0 - 36.0 g/dL Final  . RDW 11/29/2019 13.6  11.5 - 15.5 % Final  . Platelets 11/29/2019 59* 150 - 400 K/uL Final   Comment: PLATELET COUNT CONFIRMED BY SMEAR SPECIMEN CHECKED FOR CLOTS Immature Platelet Fraction may be clinically indicated, consider ordering this additional test [id number]   . nRBC 11/29/2019 0.0  0.0 - 0.2 % Final  . Neutrophils Relative % 11/29/2019 49  % Final  . Neutro Abs 11/29/2019 1.3* 1.7 - 7.7 K/uL Final  . Lymphocytes Relative 11/29/2019 29  % Final  . Lymphs Abs 11/29/2019 0.8  0.7 - 4.0 K/uL Final  . Monocytes Relative 11/29/2019 15  % Final  .  Monocytes Absolute 11/29/2019 0.4  0.1 - 1.0 K/uL Final  . Eosinophils Relative 11/29/2019 5  % Final  . Eosinophils Absolute 11/29/2019 0.1  0.0 - 0.5 K/uL Final  . Basophils Relative 11/29/2019 2  % Final  . Basophils Absolute 11/29/2019 0.0  0.0 - 0.1 K/uL Final  . Immature Granulocytes 11/29/2019 0  % Final  . Abs Immature Granulocytes  11/29/2019 0.01  0.00 - 0.07 K/uL Final   Performed at [facility], [address], [place], [place] [address]      ASSESSMENT/PLAN   Tobacco abuse 1. Tobacco abuse -This patient meets the criteriafor low dose CT lung cancer screening. She isasymptomatic for any signs or symptoms of lung cancer. -The Shared Decision-Making Visit discussion included risks and benefits of screening, potential for follow up,diagnostic testing for abnormal scans, potential for false positive tests, over diagnosis, and discussion about total radiation exposure. -Patient stated willingness to undergo diagnostics and treatment as needed. -Patientwascounseled on smoking cessation to decreaserisk of lung cancer, pulmonary disease, heart disease and stroke. -Patientwas given a resource card with information on receiving free nicotine replacement therapy , and information about free smoking cessation classes. -Patientwill present for LDCT scan today and follow up with PCP.    All questions were answered. The patient knows to call the clinic with any problems, questions or concerns.  This note was electronically signed.    Roger Shelter, FNP  12/01/2019 10:43 AM

## 2019-12-05 DIAGNOSIS — M87052 Idiopathic aseptic necrosis of left femur: Secondary | ICD-10-CM | POA: Insufficient documentation

## 2019-12-06 ENCOUNTER — Other Ambulatory Visit: Payer: Self-pay

## 2019-12-06 ENCOUNTER — Encounter: Payer: Self-pay | Admitting: Orthopaedic Surgery

## 2019-12-06 ENCOUNTER — Ambulatory Visit (INDEPENDENT_AMBULATORY_CARE_PROVIDER_SITE_OTHER): Payer: Medicare Other | Admitting: Orthopaedic Surgery

## 2019-12-06 ENCOUNTER — Ambulatory Visit: Payer: Self-pay

## 2019-12-06 VITALS — Ht 64.0 in | Wt 170.0 lb

## 2019-12-06 DIAGNOSIS — M87052 Idiopathic aseptic necrosis of left femur: Secondary | ICD-10-CM

## 2019-12-06 NOTE — Progress Notes (Signed)
Subjective: Patient is here for ultrasound-guided intra-articular left hip injection.   Groin pain.  Objective:  Walks with a significant limp.  Procedure: Ultrasound-guided left hip injection: After sterile prep with Betadine, injected 8 cc 1% lidocaine without epinephrine and 40 mg methylprednisolone using a 22-gauge spinal needle, passing the needle through the iliofemoral ligament into the femoral head/neck junction.  Injectate seen filling joint capsule.  Good immediate relief.

## 2019-12-06 NOTE — Progress Notes (Signed)
Office Visit Note   Patient: Gabriella Leon           Date of Birth: May 27, 1956           MRN: [id number] Visit Date: 12/06/2019              Requested by: Gabriella Pacas, MD [place] [address] [place],  [place] [id number] PCP: Gabriella Burly, MD   Assessment & Plan: Visit Diagnoses:  1. Avascular necrosis of bone of left hip (HCC)     Plan: My impression is left hip avascular necrosis with femoral head collapse and secondary DJD.  These x-rays were reviewed with the patient in detail.  Patient would benefit from a total hip arthroplasty has conservative measures are unlikely going to provide any prolonged relief.  However she is currently pancytopenic and will need this to improve prior to scheduling surgery.  We will obtain preoperative medical and cardiac clearances.  In the meantime we will do hip injection so that she can get some relief while she is waiting.  Her platelet count did get as high as 90 two months ago.  All this was discussed in detail with the patient. Total face to face encounter time was greater than 45 minutes and over half of this time was spent in counseling and/or coordination of care.  Follow-Up Instructions: No follow-ups on file.   Orders:  Orders Placed This Encounter  Procedures  . US Guided Needle Placement - No Linked Charges   No orders of the defined types were placed in this encounter.     Procedures: No procedures performed   Clinical Data: No additional findings.   Subjective: Chief Complaint  Patient presents with  . Left Hip - Pain    Gabriella Leon is a 63 year old female comes in for evaluation of chronic left hip pain for about a year.  She endorses left hip pain groin pain that radiates to the knee that is constant.  Is worse with any activity or weightbearing.  She takes Tylenol as needed.  She does have a significant medical and cardiac history.  She has pancytopenia as a result of her cirrhosis.  She was originally scheduled for  a left total hip replacement with Dr. Alvan Leon but this was canceled due to low platelets.  She continues to have chronic pain that is debilitating.   Review of Systems  Constitutional: Negative.   HENT: Negative.   Eyes: Negative.   Respiratory: Negative.   Cardiovascular: Negative.   Endocrine: Negative.   Musculoskeletal: Negative.   Neurological: Negative.   Hematological: Negative.   Psychiatric/Behavioral: Negative.   All other systems reviewed and are negative.    Objective: Vital Signs: Ht 5\' 4"  (1.626 m)   Wt 170 lb (77.1 kg)   BMI 29.18 kg/m   Physical Exam Vitals and nursing note reviewed.  Constitutional:      Appearance: She is well-developed.  HENT:     Head: Normocephalic and atraumatic.  Pulmonary:     Effort: Pulmonary effort is normal.  Abdominal:     Palpations: Abdomen is soft.  Musculoskeletal:     Cervical back: Neck supple.  Skin:    General: Skin is warm.     Capillary Refill: Capillary refill takes less than 2 seconds.  Neurological:     Mental Status: She is alert and oriented to person, place, and time.  Psychiatric:        Behavior: Behavior normal.        Thought  Content: Thought content normal.        Judgment: Judgment normal.     Ortho Exam Left hip exam shows minimal range of motion secondary to the severe pain.  Positive Stinchfield sign.  Positive logroll.  Negative sciatic tension signs. Specialty Comments:  No specialty comments available.  Imaging: US Guided Needle Placement - No Linked Charges  Result Date: 12/06/2019 Please see Notes tab for imaging impression.    PMFS History: Patient Active Problem List   Diagnosis Date Noted  . Avascular necrosis of bone of left hip ([place]) 12/05/2019  . PUD (peptic ulcer disease) 06/21/2019  . Left hip pain 12/06/2018  . Chronic pain of left knee 12/06/2018  . Chronic left-sided low back pain with left-sided sciatica 12/06/2018  . Vitamin D deficiency 04/14/2016  . Elevated  ferritin 02/14/2016  . Cervical stenosis of spinal canal 09/28/2015  . Idiopathic thrombocytopenic purpura ([place]) 08/29/2015  . Thrombocytopenia ([place]) 08/29/2015  . Hepatic cirrhosis due to chronic hepatitis C infection ([place]) 04/03/2015  . Esophageal varices ([place]) 09/18/2014  . Folate deficiency 03/28/2014  . Unspecified constipation 03/22/2014  . Other pancytopenia ([place]) 10/27/2013  . Hypersplenism syndrome 09/18/2013  . Hepatitis C, chronic ([place]) 09/14/2013  . Lower back pain 06/16/2013  . ETOH abuse 03/03/2013  . RUQ pain 11/07/2012  . Epigastric pain 02/04/2012  . Cirrhosis ([place]) 11/26/2011  . Abdominal pain 11/26/2011  . Screening for colon cancer 11/26/2011   Past Medical History:  Diagnosis Date  . Acid reflux disease with ulcer   . Anxiety   . Arthritis   . Asthma   . Blood dyscrasia    thrombocytopenia-next tx injection 09/25/15  . Blood transfusion   . Chronic kidney disease   . Cirrhosis ([place])   . DDD (degenerative disc disease), lumbar   . Depression   . Diabetes mellitus   . Esophageal varices ([place]) 09/18/2014  . Folate deficiency 03/28/2014   Noted on 03/23/2014.  Folate 1 mg prescribed.  . Gallstones   . GERD (gastroesophageal reflux disease)   . Headache    migraines  . Helicobacter pylori ab+    July 2010, s/p treatment  . Hepatitis B antibody positive   . Hepatitis C    and B positive antibody  . History of alcoholism ([place])   . History of kidney stones   . Hypertension   . Left leg pain   . Liver cirrhosis ([place])   . Low back pain   . Other pancytopenia ([place]) 10/27/2013  . Ulcer    ?    Family History  Problem Relation Age of Onset  . Diabetes Mother   . Heart disease Mother   . Cancer Paternal Uncle        passed away age 68  . Other Father        died in [place] in 86  . Heart disease Sister   . Anesthesia problems Neg Hx   . Hypotension Neg Hx   . Malignant hyperthermia Neg Hx   . Pseudochol deficiency Neg Hx   . Colon cancer Neg Hx   .  Liver disease Neg Hx     Past Surgical History:  Procedure Laterality Date  . ANTERIOR CERVICAL DECOMP/DISCECTOMY FUSION N/A 09/28/2015   Procedure: Cervical three-four Anterior cervical decompression/diskectomy/fusion;  Surgeon: Leeroy Cha, MD;  Location: [facility];  Service: Neurosurgery;  Laterality: N/A;  C3-4 Anterior cervical decompression/diskectomy/fusion  . APPENDECTOMY     age 15  . BIOPSY  12/18/2011      .  BIOPSY  05/19/2019   Procedure: BIOPSY;  Surgeon: Daneil Dolin, MD;  [facility];  Service: Endoscopy;;  gastric  . [place]  greater than 10 yrs   [place]  . COLONOSCOPY  12/18/11   minimal anal canal hemorrhoids, friable rectal and colonic mucosa, left-sided diverticulosis, repeat in 2022.   . ESOPHAGOGASTRODUODENOSCOPY  12/18/11   3 columns of Grade II esophageal varices, reflux esohpagitis, portal gastropathy, path with chronic gastritis, negative H.pylori, surveillance in June 2014  . ESOPHAGOGASTRODUODENOSCOPY (EGD) WITH PROPOFOL N/A 04/13/2014   Dr. [name]: grade 1-2 varices, hiatal hernia, portal gastropathy. NO need for further surveillance while on prophylaxis unless evidence of bleeding  . ESOPHAGOGASTRODUODENOSCOPY (EGD) WITH PROPOFOL N/A 05/19/2019   erosive reflux esophagitis, Grade 1-2 esophageal varices with patulous EG junction. Hiatal hernia. Portal gastropathy. Gastric and duodenal dulcer disease.   . MULTIPLE EXTRACTIONS WITH ALVEOLOPLASTY  03/08/2012   Procedure: MULTIPLE EXTRACION WITH ALVEOLOPLASTY;  Surgeon: Gae Bon, DDS;  Location: [facility];  Service: Oral Surgery;  Laterality: N/A;   Social History   Occupational History  . Occupation: disabled    Fish farm manager: NOT EMPLOYED  Tobacco Use  . Smoking status: Current Every Day Smoker    Packs/day: 0.50    Years: 20.00    Pack years: 10.00    Types: Cigarettes  . Smokeless tobacco: Never Used  Substance and Sexual Activity  . Alcohol use:  No    Comment: last use 2015  . Drug use: No  . Sexual activity: Yes    Birth control/protection: Post-menopausal

## 2019-12-07 ENCOUNTER — Telehealth: Payer: Self-pay | Admitting: Orthopaedic Surgery

## 2019-12-07 NOTE — Telephone Encounter (Signed)
Surgical clearance request sent to patient's oncologist Dr. Delton Coombes.  Patient does not know who her cardiologist is.  I called patient's PCP- Dr. Sherrie Sport and spoke with Abigail Butts.  She does not show in her records patient having a cardiologist, nor a referral to one.  Patient was seen in the [facility] ED August 13th of this year for chest pain. Patient followed up in September with [facility]. Dr. Joselyn Arrow office will provide a referral for cardiology if patient will schedule and show up for next appointment in their office.  Patient did not keep for her last visit in November.   Dr. Sherrie Sport will most likely refer this patient to Dr. Bronson Ing for cardiology.  Message left with patient to call my direct number to discuss clearances prior to left total hip case being scheduled.

## 2019-12-08 ENCOUNTER — Telehealth: Payer: Self-pay | Admitting: Internal Medicine

## 2019-12-08 ENCOUNTER — Encounter: Payer: Self-pay | Admitting: Internal Medicine

## 2019-12-08 ENCOUNTER — Ambulatory Visit: Payer: Medicare Other | Admitting: Gastroenterology

## 2019-12-08 NOTE — Telephone Encounter (Signed)
See message.

## 2019-12-08 NOTE — Telephone Encounter (Signed)
thanks

## 2019-12-08 NOTE — Telephone Encounter (Signed)
Patient was a no show and letter sent  °

## 2019-12-20 ENCOUNTER — Ambulatory Visit: Payer: Medicare Other | Admitting: Gastroenterology

## 2019-12-20 ENCOUNTER — Telehealth: Payer: Self-pay | Admitting: Internal Medicine

## 2019-12-20 ENCOUNTER — Encounter: Payer: Self-pay | Admitting: Internal Medicine

## 2019-12-20 NOTE — Telephone Encounter (Signed)
Patient was a no show and letter sent  °

## 2019-12-22 DIAGNOSIS — Z720 Tobacco use: Secondary | ICD-10-CM | POA: Insufficient documentation

## 2019-12-22 NOTE — Assessment & Plan Note (Signed)
1. Tobacco abuse -This patient meets the criteriafor low dose CT lung cancer screening. She isasymptomatic for any signs or symptoms of lung cancer. -The Shared Decision-Making Visit discussion included risks and benefits of screening, potential for follow up,diagnostic testing for abnormal scans, potential for false positive tests, over diagnosis, and discussion about total radiation exposure. -Patient stated willingness to undergo diagnostics and treatment as needed. -Patientwascounseled on smoking cessation to decreaserisk of lung cancer, pulmonary disease, heart disease and stroke. -Patientwas given a resource card with information on receiving free nicotine replacement therapy , and information about free smoking cessation classes. -Patientwill present for LDCT scan today and follow up with PCP.

## 2019-12-23 DIAGNOSIS — I509 Heart failure, unspecified: Secondary | ICD-10-CM

## 2019-12-23 HISTORY — DX: Heart failure, unspecified: I50.9

## 2019-12-29 ENCOUNTER — Other Ambulatory Visit (HOSPITAL_COMMUNITY): Payer: Self-pay

## 2019-12-29 ENCOUNTER — Other Ambulatory Visit: Payer: Self-pay | Admitting: Neurology

## 2019-12-29 DIAGNOSIS — R7989 Other specified abnormal findings of blood chemistry: Secondary | ICD-10-CM

## 2019-12-29 DIAGNOSIS — D61818 Other pancytopenia: Secondary | ICD-10-CM

## 2019-12-29 DIAGNOSIS — D693 Immune thrombocytopenic purpura: Secondary | ICD-10-CM

## 2019-12-30 ENCOUNTER — Inpatient Hospital Stay (HOSPITAL_COMMUNITY): Payer: Medicare HMO | Attending: Hematology

## 2019-12-30 ENCOUNTER — Ambulatory Visit (HOSPITAL_COMMUNITY): Payer: Medicare Other | Admitting: Hematology

## 2020-01-03 ENCOUNTER — Telehealth: Payer: Self-pay | Admitting: Orthopaedic Surgery

## 2020-01-03 NOTE — Telephone Encounter (Signed)
Patient's home nurse called  She wanted to know the reason why her patient was referred to our office because she already has an orthopedic doctor.  Call back number: [phone]

## 2020-01-03 NOTE — Telephone Encounter (Signed)
I think it was patient's choice to come see [place].  But I'm exactly sure.

## 2020-01-05 NOTE — Telephone Encounter (Signed)
Called no answer. Just need to advise on message below.

## 2020-01-06 ENCOUNTER — Ambulatory Visit: Payer: Medicare HMO | Admitting: Gastroenterology

## 2020-01-11 DIAGNOSIS — M1612 Unilateral primary osteoarthritis, left hip: Secondary | ICD-10-CM | POA: Diagnosis not present

## 2020-01-11 DIAGNOSIS — M87852 Other osteonecrosis, left femur: Secondary | ICD-10-CM | POA: Diagnosis not present

## 2020-01-17 ENCOUNTER — Ambulatory Visit (INDEPENDENT_AMBULATORY_CARE_PROVIDER_SITE_OTHER): Payer: Medicare HMO | Admitting: Gastroenterology

## 2020-01-17 ENCOUNTER — Emergency Department (HOSPITAL_COMMUNITY)
Admission: EM | Admit: 2020-01-17 | Discharge: 2020-01-18 | Disposition: A | Discharge status: Home / Self Care | Payer: Medicare HMO | Attending: Emergency Medicine | Admitting: Emergency Medicine

## 2020-01-17 ENCOUNTER — Emergency Department (HOSPITAL_COMMUNITY): Payer: Medicare HMO

## 2020-01-17 ENCOUNTER — Encounter: Payer: Self-pay | Admitting: Gastroenterology

## 2020-01-17 ENCOUNTER — Other Ambulatory Visit: Payer: Self-pay

## 2020-01-17 ENCOUNTER — Encounter (HOSPITAL_COMMUNITY): Payer: Self-pay

## 2020-01-17 VITALS — BP 174/83 | HR 75 | Temp 97.1°F | Ht 64.0 in | Wt 165.0 lb

## 2020-01-17 DIAGNOSIS — E1122 Type 2 diabetes mellitus with diabetic chronic kidney disease: Secondary | ICD-10-CM | POA: Insufficient documentation

## 2020-01-17 DIAGNOSIS — Z7984 Long term (current) use of oral hypoglycemic drugs: Secondary | ICD-10-CM | POA: Diagnosis not present

## 2020-01-17 DIAGNOSIS — K7460 Unspecified cirrhosis of liver: Secondary | ICD-10-CM

## 2020-01-17 DIAGNOSIS — K5732 Diverticulitis of large intestine without perforation or abscess without bleeding: Secondary | ICD-10-CM | POA: Insufficient documentation

## 2020-01-17 DIAGNOSIS — R0602 Shortness of breath: Secondary | ICD-10-CM | POA: Diagnosis not present

## 2020-01-17 DIAGNOSIS — Z79899 Other long term (current) drug therapy: Secondary | ICD-10-CM | POA: Insufficient documentation

## 2020-01-17 DIAGNOSIS — I129 Hypertensive chronic kidney disease with stage 1 through stage 4 chronic kidney disease, or unspecified chronic kidney disease: Secondary | ICD-10-CM | POA: Insufficient documentation

## 2020-01-17 DIAGNOSIS — K219 Gastro-esophageal reflux disease without esophagitis: Secondary | ICD-10-CM | POA: Diagnosis not present

## 2020-01-17 DIAGNOSIS — R05 Cough: Secondary | ICD-10-CM | POA: Diagnosis not present

## 2020-01-17 DIAGNOSIS — N189 Chronic kidney disease, unspecified: Secondary | ICD-10-CM | POA: Diagnosis not present

## 2020-01-17 DIAGNOSIS — I10 Essential (primary) hypertension: Secondary | ICD-10-CM | POA: Diagnosis not present

## 2020-01-17 DIAGNOSIS — R112 Nausea with vomiting, unspecified: Secondary | ICD-10-CM | POA: Insufficient documentation

## 2020-01-17 DIAGNOSIS — K5792 Diverticulitis of intestine, part unspecified, without perforation or abscess without bleeding: Secondary | ICD-10-CM | POA: Diagnosis not present

## 2020-01-17 DIAGNOSIS — Z20822 Contact with and (suspected) exposure to covid-19: Secondary | ICD-10-CM | POA: Insufficient documentation

## 2020-01-17 DIAGNOSIS — K279 Peptic ulcer, site unspecified, unspecified as acute or chronic, without hemorrhage or perforation: Secondary | ICD-10-CM | POA: Diagnosis not present

## 2020-01-17 DIAGNOSIS — R1033 Periumbilical pain: Secondary | ICD-10-CM | POA: Diagnosis present

## 2020-01-17 DIAGNOSIS — K760 Fatty (change of) liver, not elsewhere classified: Secondary | ICD-10-CM | POA: Diagnosis not present

## 2020-01-17 DIAGNOSIS — R109 Unspecified abdominal pain: Secondary | ICD-10-CM | POA: Diagnosis not present

## 2020-01-17 LAB — COMPREHENSIVE METABOLIC PANEL
ALT: 38 U/L (ref 0–44)
AST: 81 U/L — ABNORMAL HIGH (ref 15–41)
Albumin: 3 g/dL — ABNORMAL LOW (ref 3.5–5.0)
Alkaline Phosphatase: 118 U/L (ref 38–126)
Anion gap: 8 (ref 5–15)
BUN: 14 mg/dL (ref 8–23)
CO2: 26 mmol/L (ref 22–32)
Calcium: 8.8 mg/dL — ABNORMAL LOW (ref 8.9–10.3)
Chloride: 101 mmol/L (ref 98–111)
Creatinine, Ser: 1.01 mg/dL — ABNORMAL HIGH (ref 0.44–1.00)
GFR calc Af Amer: >60 mL/min (ref >60)
GFR calc non Af Amer: >60 mL/min (ref >60)
Glucose, Bld: 143 mg/dL — ABNORMAL HIGH (ref 70–99)
Potassium: 3.7 mmol/L (ref 3.5–5.1)
Sodium: 135 mmol/L (ref 135–145)
Total Bilirubin: 4 mg/dL — ABNORMAL HIGH (ref 0.3–1.2)
Total Protein: 6.8 g/dL (ref 6.5–8.1)

## 2020-01-17 LAB — CBC WITH DIFFERENTIAL/PLATELET
Abs Immature Granulocytes: 0.01 10*3/uL (ref 0.00–0.07)
Basophils Absolute: 0 10*3/uL (ref 0.0–0.1)
Basophils Relative: 1 %
Eosinophils Absolute: 0 10*3/uL (ref 0.0–0.5)
Eosinophils Relative: 1 %
HCT: 33.1 % — ABNORMAL LOW (ref 36.0–46.0)
Hemoglobin: 11.1 g/dL — ABNORMAL LOW (ref 12.0–15.0)
Immature Granulocytes: 0 %
Lymphocytes Relative: 14 %
Lymphs Abs: 0.4 10*3/uL — ABNORMAL LOW (ref 0.7–4.0)
MCH: 35.5 pg — ABNORMAL HIGH (ref 26.0–34.0)
MCHC: 33.5 g/dL (ref 30.0–36.0)
MCV: 105.8 fL — ABNORMAL HIGH (ref 80.0–100.0)
Monocytes Absolute: 0.3 10*3/uL (ref 0.1–1.0)
Monocytes Relative: 10 %
Neutro Abs: 2.2 10*3/uL (ref 1.7–7.7)
Neutrophils Relative %: 74 %
Platelets: 33 10*3/uL — ABNORMAL LOW (ref 150–400)
RBC: 3.13 MIL/uL — ABNORMAL LOW (ref 3.87–5.11)
RDW: 13.4 % (ref 11.5–15.5)
WBC: 3 10*3/uL — ABNORMAL LOW (ref 4.0–10.5)
nRBC: 0 % (ref 0.0–0.2)

## 2020-01-17 LAB — URINALYSIS, ROUTINE W REFLEX MICROSCOPIC
Bilirubin Urine: NEGATIVE
Glucose, UA: NEGATIVE mg/dL
Hgb urine dipstick: NEGATIVE
Ketones, ur: NEGATIVE mg/dL
Leukocytes,Ua: NEGATIVE
Nitrite: NEGATIVE
Protein, ur: NEGATIVE mg/dL
Specific Gravity, Urine: >1.046 — ABNORMAL HIGH (ref 1.005–1.030)
pH: 6 (ref 5.0–8.0)

## 2020-01-17 LAB — TROPONIN I (HIGH SENSITIVITY): Troponin I (High Sensitivity): 5 ng/L (ref <18)

## 2020-01-17 LAB — LIPASE, BLOOD: Lipase: 38 U/L (ref 11–51)

## 2020-01-17 MED ORDER — CIPROFLOXACIN IN D5W 400 MG/200ML IV SOLN
400.0000 mg | Freq: Once | INTRAVENOUS | Status: AC
  Administered 2020-01-17: 400 mg via INTRAVENOUS
  Filled 2020-01-17: qty 200

## 2020-01-17 MED ORDER — METRONIDAZOLE IN NACL 5-0.79 MG/ML-% IV SOLN
500.0000 mg | Freq: Once | INTRAVENOUS | Status: AC
  Administered 2020-01-17: 500 mg via INTRAVENOUS
  Filled 2020-01-17: qty 100

## 2020-01-17 MED ORDER — FAMOTIDINE IN NACL 20-0.9 MG/50ML-% IV SOLN
20.0000 mg | Freq: Once | INTRAVENOUS | Status: AC
  Administered 2020-01-17: 20 mg via INTRAVENOUS
  Filled 2020-01-17: qty 50

## 2020-01-17 MED ORDER — MORPHINE SULFATE (PF) 4 MG/ML IV SOLN
4.0000 mg | Freq: Once | INTRAVENOUS | Status: AC
  Administered 2020-01-17: 19:00:00 4 mg via INTRAVENOUS
  Filled 2020-01-17: qty 1

## 2020-01-17 MED ORDER — HYDROMORPHONE HCL 1 MG/ML IJ SOLN
1.0000 mg | Freq: Once | INTRAMUSCULAR | Status: AC
  Administered 2020-01-17: 1 mg via INTRAVENOUS
  Filled 2020-01-17: qty 1

## 2020-01-17 MED ORDER — SUCRALFATE 1 GM/10ML PO SUSP
1.0000 g | Freq: Three times a day (TID) | ORAL | Status: DC
  Administered 2020-01-17: 1 g via ORAL
  Filled 2020-01-17: qty 10

## 2020-01-17 MED ORDER — ONDANSETRON HCL 4 MG PO TABS: 4.0000 mg | ORAL_TABLET | Freq: Four times a day (QID) | ORAL | 0 refills | Status: DC

## 2020-01-17 MED ORDER — AMOXICILLIN-POT CLAVULANATE 875-125 MG PO TABS: 1.0000 | ORAL_TABLET | Freq: Two times a day (BID) | ORAL | 0 refills | Status: AC

## 2020-01-17 MED ORDER — SODIUM CHLORIDE 0.9 % IV BOLUS
1000.0000 mL | Freq: Once | INTRAVENOUS | Status: AC
  Administered 2020-01-17: 1000 mL via INTRAVENOUS

## 2020-01-17 MED ORDER — PANTOPRAZOLE SODIUM 40 MG IV SOLR
40.0000 mg | Freq: Once | INTRAVENOUS | Status: AC
  Administered 2020-01-17: 40 mg via INTRAVENOUS
  Filled 2020-01-17: qty 40

## 2020-01-17 MED ORDER — ONDANSETRON HCL 4 MG/2ML IJ SOLN
4.0000 mg | Freq: Once | INTRAMUSCULAR | Status: AC
  Administered 2020-01-17: 4 mg via INTRAVENOUS
  Filled 2020-01-17: qty 2

## 2020-01-17 MED ORDER — IOHEXOL 300 MG/ML  SOLN
100.0000 mL | Freq: Once | INTRAMUSCULAR | Status: AC | PRN
  Administered 2020-01-17: 100 mL via INTRAVENOUS

## 2020-01-17 MED ORDER — OXYCODONE HCL 5 MG PO TABS: 5.0000 mg | ORAL_TABLET | Freq: Four times a day (QID) | ORAL | 0 refills | Status: DC | PRN

## 2020-01-17 NOTE — ED Provider Notes (Signed)
[facility] EMERGENCY DEPARTMENT Provider Note   CSN: [id number] Arrival date & time: 01/17/20  1637     History Chief Complaint  Patient presents with  . Abdominal Pain    Gabriella Leon is a 64 y.o. female.  HPI    Pt is a 64 y/o female with a h/o PUD, CKD, cirrhosis, diabetes, esophageal varices, gallstones s/p cholecystectomy, GERD, hypertension, who presents to the ED today for eval of abd pain for the last 3 days. Pain located to the epigastric/periumbilical region. Pain is constant and is sharp. Denies exacerbating and alleviating factors.  She reports associated nausea, vomiting, shortness of breath and cough.  She denies any chest pain or pleuritic pain.  She denies any known fevers. Denies hematemesis, dysuria, frequency, chest pain. States she has had decreased appetite and PO intake for the last 6 days.  She denies any diarrhea or constipation.  Past Medical History:  Diagnosis Date  . Acid reflux disease with ulcer   . Anxiety   . Arthritis   . Asthma   . Blood dyscrasia    thrombocytopenia-next tx injection 09/25/15  . Blood transfusion   . Chronic kidney disease   . Cirrhosis ([place])   . DDD (degenerative disc disease), lumbar   . Depression   . Diabetes mellitus   . Esophageal varices ([place]) 09/18/2014  . Folate deficiency 03/28/2014   Noted on 03/23/2014.  Folate 1 mg prescribed.  . Gallstones   . GERD (gastroesophageal reflux disease)   . Headache    migraines  . Helicobacter pylori ab+    July 2010, s/p treatment  . Hepatitis B antibody positive   . Hepatitis C    and B positive antibody  . History of alcoholism ([place])   . History of kidney stones   . Hypertension   . Left leg pain   . Liver cirrhosis ([place])   . Low back pain   . Other pancytopenia ([place]) 10/27/2013  . Ulcer    ?    Patient Active Problem List   Diagnosis Date Noted  . Tobacco abuse 12/22/2019  . Avascular necrosis of bone of left hip ([place]) 12/05/2019  . PUD (peptic ulcer  disease) 06/21/2019  . Left hip pain 12/06/2018  . Chronic pain of left knee 12/06/2018  . Chronic left-sided low back pain with left-sided sciatica 12/06/2018  . Vitamin D deficiency 04/14/2016  . Elevated ferritin 02/14/2016  . Cervical stenosis of spinal canal 09/28/2015  . Idiopathic thrombocytopenic purpura ([place]) 08/29/2015  . Thrombocytopenia ([place]) 08/29/2015  . Hepatic cirrhosis due to chronic hepatitis C infection ([place]) 04/03/2015  . Esophageal varices ([place]) 09/18/2014  . Folate deficiency 03/28/2014  . Unspecified constipation 03/22/2014  . Other pancytopenia ([place]) 10/27/2013  . Hypersplenism syndrome 09/18/2013  . Hepatitis C, chronic ([place]) 09/14/2013  . Lower back pain 06/16/2013  . ETOH abuse 03/03/2013  . RUQ pain 11/07/2012  . Epigastric pain 02/04/2012  . Cirrhosis ([place]) 11/26/2011  . Abdominal pain 11/26/2011  . Screening for colon cancer 11/26/2011    Past Surgical History:  Procedure Laterality Date  . ANTERIOR CERVICAL DECOMP/DISCECTOMY FUSION N/A 09/28/2015   Procedure: Cervical three-four Anterior cervical decompression/diskectomy/fusion;  Surgeon: Leeroy Cha, MD;  Location: [facility];  Service: Neurosurgery;  Laterality: N/A;  C3-4 Anterior cervical decompression/diskectomy/fusion  . APPENDECTOMY     age 36  . BIOPSY  12/18/2011      . BIOPSY  05/19/2019   Procedure: BIOPSY;  Surgeon: Daneil Dolin, MD;  [facility];  Service: Endoscopy;;  gastric  . [place]  greater than 10 yrs   [place]  . COLONOSCOPY  12/18/11   minimal anal canal hemorrhoids, friable rectal and colonic mucosa, left-sided diverticulosis, repeat in 2022.   . ESOPHAGOGASTRODUODENOSCOPY  12/18/11   3 columns of Grade II esophageal varices, reflux esohpagitis, portal gastropathy, path with chronic gastritis, negative H.pylori, surveillance in June 2014  . ESOPHAGOGASTRODUODENOSCOPY (EGD) WITH PROPOFOL N/A 04/13/2014   Dr. [name]:  grade 1-2 varices, hiatal hernia, portal gastropathy. NO need for further surveillance while on prophylaxis unless evidence of bleeding  . ESOPHAGOGASTRODUODENOSCOPY (EGD) WITH PROPOFOL N/A 05/19/2019   erosive reflux esophagitis, Grade 1-2 esophageal varices with patulous EG junction. Hiatal hernia. Portal gastropathy. Gastric and duodenal dulcer disease.   . MULTIPLE EXTRACTIONS WITH ALVEOLOPLASTY  03/08/2012   Procedure: MULTIPLE EXTRACION WITH ALVEOLOPLASTY;  Surgeon: Gae Bon, DDS;  Location: [facility];  Service: Oral Surgery;  Laterality: N/A;     OB History   No obstetric history on file.     Family History  Problem Relation Age of Onset  . Diabetes Mother   . Heart disease Mother   . Cancer Paternal Uncle        passed away age 65  . Other Father        died in [place] in 54  . Heart disease Sister   . Anesthesia problems Neg Hx   . Hypotension Neg Hx   . Malignant hyperthermia Neg Hx   . Pseudochol deficiency Neg Hx   . Colon cancer Neg Hx   . Liver disease Neg Hx     Social History   Tobacco Use  . Smoking status: Current Every Day Smoker    Packs/day: 0.50    Years: 20.00    Pack years: 10.00    Types: Cigarettes  . Smokeless tobacco: Never Used  Substance Use Topics  . Alcohol use: No    Comment: last use 2015  . Drug use: No    Home Medications Prior to Admission medications   Medication Sig Start Date End Date Taking? Authorizing Provider  famotidine (PEPCID) 20 MG tablet Take 20 mg by mouth 2 (two) times daily.  07/20/19  Yes [provider]  lisinopril-hydrochlorothiazide (PRINZIDE,ZESTORETIC) 10-12.5 MG per tablet Take 1 tablet by mouth daily.   Yes [provider]  meloxicam (MOBIC) 15 MG tablet Take 15 mg by mouth daily. 09/27/19  Yes [provider]  metFORMIN (GLUCOPHAGE) 500 MG tablet Take 500 mg by mouth daily with breakfast.   Yes [provider]  nadolol (CORGARD) 40 MG tablet Take 40 mg by mouth daily.   05/19/19  Yes [provider]  naproxen sodium (ALEVE) 220 MG tablet Take 440 mg by mouth daily as needed (for pain).   Yes [provider]  pantoprazole (PROTONIX) 40 MG tablet Take 40 mg by mouth 2 (two) times daily. 11/25/19  Yes [provider]  spironolactone (ALDACTONE) 25 MG tablet TAKE 1 TABLET BY MOUTH DAILY Patient taking differently: Take 25 mg by mouth daily.  11/22/19  Yes Carlis Stable, NP  amoxicillin-clavulanate (AUGMENTIN) 875-125 MG tablet Take 1 tablet by mouth 2 (two) times daily for 7 days. 01/17/20 01/24/20  Eleah Lahaie S, PA-C  ondansetron (ZOFRAN) 4 MG tablet Take 1 tablet (4 mg total) by mouth every 6 (six) hours. 01/17/20   Staci Carver S, PA-C  oxyCODONE (ROXICODONE) 5 MG  immediate release tablet Take 1 tablet (5 mg total) by mouth every 6 (six) hours as needed for severe pain. 01/17/20   Eureka Valdes S, PA-C    Allergies    Patient has no known allergies.  Review of Systems   Review of Systems  Constitutional: Negative for chills and fever.  HENT: Negative for ear pain and sore throat.   Eyes: Negative for visual disturbance.  Respiratory: Positive for cough and shortness of breath.   Cardiovascular: Negative for chest pain.  Gastrointestinal: Positive for abdominal pain, nausea and vomiting. Negative for constipation and diarrhea.  Genitourinary: Negative for dysuria and hematuria.  Musculoskeletal: Negative for back pain.  Skin: Negative for rash.  Neurological: Negative for headaches.  All other systems reviewed and are negative.   Physical Exam Updated Vital Signs BP 125/70   Pulse 70   Temp 98.7 F (37.1 C) (Oral)   Resp 12   Ht 5\' 4"  (1.626 m)   Wt 74.8 kg   SpO2 98%   BMI 28.32 kg/m   Physical Exam Vitals and nursing note reviewed.  Constitutional:      General: She is not in acute distress.    Appearance: She is well-developed. She is not ill-appearing or toxic-appearing.  HENT:     Head: Normocephalic and  atraumatic.  Eyes:     Conjunctiva/sclera: Conjunctivae normal.  Cardiovascular:     Rate and Rhythm: Normal rate and regular rhythm.     Heart sounds: Normal heart sounds. No murmur.  Pulmonary:     Effort: Pulmonary effort is normal. No respiratory distress.     Breath sounds: Normal breath sounds. No wheezing, rhonchi or rales.  Abdominal:     General: Bowel sounds are normal.     Palpations: Abdomen is soft.     Tenderness: There is abdominal tenderness in the epigastric area and periumbilical area. There is guarding. There is no rebound.  Musculoskeletal:     Cervical back: Neck supple.  Skin:    General: Skin is warm and dry.  Neurological:     Mental Status: She is alert.     ED Results / Procedures / Treatments   Labs (all labs ordered are listed, but only abnormal results are displayed) Labs Reviewed  CBC WITH DIFFERENTIAL/PLATELET - Abnormal; Notable for the following components:      Result Value   WBC 3.0 (*)    RBC 3.13 (*)    Hemoglobin 11.1 (*)    HCT 33.1 (*)    MCV 105.8 (*)    MCH 35.5 (*)    Platelets 33 (*)    Lymphs Abs 0.4 (*)    All other components within normal limits  COMPREHENSIVE METABOLIC PANEL - Abnormal; Notable for the following components:   Glucose, Bld 143 (*)    Creatinine, Ser 1.01 (*)    Calcium 8.8 (*)    Albumin 3.0 (*)    AST 81 (*)    Total Bilirubin 4.0 (*)    All other components within normal limits  NOVEL CORONAVIRUS, NAA (HOSP ORDER, SEND-OUT TO REF LAB; TAT 18-24 HRS)  LIPASE, BLOOD  URINALYSIS, ROUTINE W REFLEX MICROSCOPIC  TROPONIN I (HIGH SENSITIVITY)    EKG EKG Interpretation  Date/Time:  Tuesday January 17 2020 18:24:32 EST Ventricular Rate:  81 PR Interval:    QRS Duration: 83 QT Interval:  384 QTC Calculation: 446 R Axis:   84 Text Interpretation: Sinus or ectopic atrial rhythm Borderline short PR interval Borderline right axis  deviation Probable anteroseptal infarct, old Baseline wander in lead(s) V3  V4 No significant change since last tracing Confirmed by Fredia Sorrow [phone]) on 01/17/2020 6:27:03 PM   Radiology CT ABDOMEN PELVIS W CONTRAST  Result Date: 01/17/2020 CLINICAL DATA:  Abdominal pain. EXAM: CT ABDOMEN AND PELVIS WITH CONTRAST TECHNIQUE: Multidetector CT imaging of the abdomen and pelvis was performed using the standard protocol following bolus administration of intravenous contrast. CONTRAST:  158mL OMNIPAQUE IOHEXOL 300 MG/ML  SOLN COMPARISON:  July 06, 2019 FINDINGS: Lower chest: No acute abnormality. Hepatobiliary: The liver is cirrhotic in appearance. No focal liver abnormality is seen. Diffuse fatty infiltration of the liver parenchyma is noted. Status post cholecystectomy. Pancreas: Unremarkable. No pancreatic ductal dilatation or surrounding inflammatory changes. Spleen: There is moderate severity splenomegaly. A 2.1 cm x 2.1 cm accessory spleen is seen adjacent to the lateral aspect of the left kidney. Adrenals/Urinary Tract: Adrenal glands are unremarkable. Kidneys are normal in size, without renal calculi or hydronephrosis. 6 mm cysts are seen within the anterior and posterior aspect of the mid left kidney. A 7 mm cyst is seen along the posterolateral aspect of the mid right kidney. Bladder is unremarkable. Stomach/Bowel: Stomach is within normal limits. Appendix appears normal. No evidence of bowel dilatation. Noninflamed diverticula are seen throughout the large bowel. A mildly inflamed diverticulum is seen along the terminal ileum (axial CT images 55 through 61, CT series number 2/coronal reformatted image 34 through 40, CT series number 5). Vascular/Lymphatic: Marked severity aortic atherosclerosis. Dilated tortuous varices are seen within the anterior aspect of the abdomen, along the midline. No enlarged abdominal or pelvic lymph nodes. Reproductive: A 9 mm x 7 mm hyperdense focus is seen within the region of the uterine fundus (axial CT image 67, CT series number 2). This is  not clearly seen on the prior study. Other: No abdominal wall hernia or abnormality. No abdominopelvic ascites. Musculoskeletal: Multilevel degenerative changes are seen throughout the lumbar spine. This is most prominent at the levels of L3-L4, L4-L5 and L5-S1. Moderate to marked severity degenerative changes are seen involving the bilateral hips. IMPRESSION: 1. Mild diverticulitis involving the terminal ileum. 2. Colonic diverticulosis. 3. Cirrhotic, fatty liver with numerous varices. 4. Evidence of prior cholecystectomy. 5. Stable splenomegaly. 6. Small hyperdense focus within the uterus which may represent a small, noncalcified uterine fibroid. Electronically Signed   By: Virgina Norfolk M.D.   On: 01/17/2020 19:39   DG Chest Portable 1 View  Result Date: 01/17/2020 CLINICAL DATA:  Abdominal pain. EXAM: PORTABLE CHEST 1 VIEW COMPARISON:  August 09, 2019 FINDINGS: The heart size and mediastinal contours are within normal limits. Both lungs are clear. The visualized skeletal structures are unremarkable. The visualized portion the abdomen is unremarkable. IMPRESSION: No active disease. Electronically Signed   By: Virgina Norfolk M.D.   On: 01/17/2020 18:51    Procedures Procedures (including critical care time)  Medications Ordered in ED Medications  sucralfate (CARAFATE) 1 GM/10ML suspension 1 g (1 g Oral Given 01/17/20 2124)  sodium chloride 0.9 % bolus 1,000 mL (0 mLs Intravenous Stopped 01/17/20 2000)  ondansetron (ZOFRAN) injection 4 mg (4 mg Intravenous Given 01/17/20 1834)  morphine 4 MG/ML injection 4 mg (4 mg Intravenous Given 01/17/20 1834)  iohexol (OMNIPAQUE) 300 MG/ML solution 100 mL (100 mLs Intravenous Contrast Given 01/17/20 1914)  ciprofloxacin (CIPRO) IVPB 400 mg (0 mg Intravenous Stopped 01/17/20 2123)  metroNIDAZOLE (FLAGYL) IVPB 500 mg (0 mg Intravenous Stopped 01/17/20 2123)  HYDROmorphone (DILAUDID) injection 1 mg (  1 mg Intravenous Given 01/17/20 2023)  pantoprazole (PROTONIX)  injection 40 mg (40 mg Intravenous Given 01/17/20 2023)  famotidine (PEPCID) IVPB 20 mg premix (0 mg Intravenous Stopped 01/17/20 2151)  HYDROmorphone (DILAUDID) injection 1 mg (1 mg Intravenous Given 01/17/20 2124)    ED Course  I have reviewed the triage vital signs and the nursing notes.  Pertinent labs & imaging results that were available during my care of the patient were reviewed by me and considered in my medical decision making (see chart for details).    MDM Rules/Calculators/A&P                      64 year old female with history of cirrhosis who presents the emergency department today complaining of abdominal pain ongoing for the last 3 days.  Associated with nausea and vomiting.  Is also having some cough and some shortness of breath.  On arrival patient afebrile satting well on room air with normal blood pressure and pulse.  She does have some tenderness to the epigastrium and periumbilical region with guarding.  She denies any rebound tenderness on my exam.  Abdomen is soft and bowel sounds are normal.  Lungs with auscultation bilaterally.  Heart with regular rate and rhythm.  Reviewed labs CBC is with leukopenia which appears chronic , baseline anemia and thrombocytopenia which also appears chronic in and stable CMP with baseline kidney and liver function.  Bilirubin elevated at 4.  - likely related to h/o cirrhosis, s/p cholecystectomy, no biliary ductal dilatation on CT imaging Lipase is negative Troponin is negative  CT abd/pelvis with mild diverticulitis involving the terminal ileum. Colonic diverticulosis. Cirrhotic, fatty liver with numerous varices. Evidence of prior cholecystectomy. Stable splenomegaly. Small hyperdense focus within the uterus which may represent a small, noncalcified uterine fibroid.   Pt given IVF, nausea meds, pain meds, cipro and flagyl. On reassessment, pt states she feels some improvement after medications. Discussed diagnosis and plan for abx  and f/u with GI. Advised on return precautions. She voiced understanding of the plan and reasons to return.  At shift change, care transitioned to Etta Quill, NP with plan to f/u on pending UA. If neg, and pt able to tolerate PO, she is appropriate for d/c with close GI f/u.   Final Clinical Impression(s) / ED Diagnoses Final diagnoses:  Diverticulitis    Rx / DC Orders ED Discharge Orders         Ordered    amoxicillin-clavulanate (AUGMENTIN) 875-125 MG tablet  2 times daily     01/17/20 2143    ondansetron (ZOFRAN) 4 MG tablet  Every 6 hours     01/17/20 2143    oxyCODONE (ROXICODONE) 5 MG immediate release tablet  Every 6 hours PRN     01/17/20 2143           Rodney Booze, PA-C 01/17/20 2229    Fredia Sorrow, MD 01/24/20 1544

## 2020-01-17 NOTE — ED Triage Notes (Signed)
Pt is having abdominal pain and was sent from GI doctor. They are suspicious of a perforation. Pain for the last 3-4 days. NVD.

## 2020-01-17 NOTE — Discharge Instructions (Signed)
You were given a prescription for antibiotics. Please take the antibiotic prescription fully.   You were also given medications for nausea.   Prescription given for Oxycodone. Take medication as directed and do not operate machinery, drive a car, or work while taking this medication as it can make you drowsy.   Please follow up with your gastroenterology doctor within the next 2-3 days. Please return to the ER sooner if you have any new or worsening symptoms, or if you have any of the following symptoms:  Abdominal pain that does not go away.  You have a fever.  You keep throwing up (vomiting).  The pain is felt only in portions of the abdomen. Pain in the right side could possibly be appendicitis. In an adult, pain in the left lower portion of the abdomen could be colitis or diverticulitis.  You pass bloody or black tarry stools.  There is bright red blood in the stool.  The constipation stays for more than 4 days.  There is belly (abdominal) or rectal pain.  You do not seem to be getting better.  You have any questions or concerns.

## 2020-01-17 NOTE — Progress Notes (Signed)
Referring Provider: Neale Burly, MD Primary Care Physician:  Neale Burly, MD Primary GI: Dr. [name]   Chief Complaint  Patient presents with  . Abdominal Pain    nausea,poor appetite    HPI:   Gabriella Leon is a 64 y.o. female presenting today with a history of cirrhosis, Hep C s/p treatment with documented SVR, PUD in May 2020 and need for surveillance EGD. Known Grade 1-2 varices. Prior EGD in Aug 2020 had been cancelled due to chest pain. She was seen in the ED at that time but has not been evaluated by cardiology. Followed by Dr. Erlinda Hong with Orthopedic surgery due to avascular necrosis of left hip bone; she has not been able to undergo surgery due to pancytopenia. Followed by Hematology, history of elevated ferritin felt to be related to liver disease.   Presents today with acute on chronic abdominal pain starting two days ago and escalating.  No chills. Afebrile today. States she felt hot yesterday. Vomiting. No hematemesis. +nausea. Nothing relieves pain. Drinking liquids but haven't eaten in a week. Can't sleep. No appetite. Taking Ibuprofen twice a day. Taking omeprazole once daily. Taking Mobic. Pain feels about an 8 out of 10. Nothing relieves. Still taking Nadolol, and I note she also has propanolol with her, which she has been taking as well. BP 174/83. No ETOH use. No diarrhea or constipation. +flatus.   She notes shortness of breath at rest, worsening over past 2 weeks. Feels like she can't get a deep breath in.   Continues to take aldactone 25 mg but stopped lasix previously.  Needs to be off both.    Renal function had been slowly declining over past few months. Last creatinine 1.31. She has lost about 5 lbs from visit in Sept 2020.   Past Medical History:  Diagnosis Date  . Acid reflux disease with ulcer   . Anxiety   . Arthritis   . Asthma   . Blood dyscrasia    thrombocytopenia-next tx injection 09/25/15  . Blood transfusion   . Chronic kidney disease    . Cirrhosis ([place])   . DDD (degenerative disc disease), lumbar   . Depression   . Diabetes mellitus   . Esophageal varices ([place]) 09/18/2014  . Folate deficiency 03/28/2014   Noted on 03/23/2014.  Folate 1 mg prescribed.  . Gallstones   . GERD (gastroesophageal reflux disease)   . Headache    migraines  . Helicobacter pylori ab+    July 2010, s/p treatment  . Hepatitis B antibody positive   . Hepatitis C    and B positive antibody  . History of alcoholism ([place])   . History of kidney stones   . Hypertension   . Left leg pain   . Liver cirrhosis ([place])   . Low back pain   . Other pancytopenia ([place]) 10/27/2013  . Ulcer    ?    Past Surgical History:  Procedure Laterality Date  . ANTERIOR CERVICAL DECOMP/DISCECTOMY FUSION N/A 09/28/2015   Procedure: Cervical three-four Anterior cervical decompression/diskectomy/fusion;  Surgeon: Leeroy Cha, MD;  Location: [place] [facility];  Service: Neurosurgery;  Laterality: N/A;  C3-4 Anterior cervical decompression/diskectomy/fusion  . APPENDECTOMY     age 80  . BIOPSY  12/18/2011      . BIOPSY  05/19/2019   Procedure: BIOPSY;  Surgeon: Daneil Dolin, MD;  Location: [facility];  Service: Endoscopy;;  gastric  . [place]  .  CHOLECYSTECTOMY  greater than 10 yrs   [facility]  . COLONOSCOPY  12/18/11   minimal anal canal hemorrhoids, friable rectal and colonic mucosa, left-sided diverticulosis, repeat in 2022.   . ESOPHAGOGASTRODUODENOSCOPY  12/18/11   3 columns of Grade II esophageal varices, reflux esohpagitis, portal gastropathy, path with chronic gastritis, negative H.pylori, surveillance in June 2014  . ESOPHAGOGASTRODUODENOSCOPY (EGD) WITH PROPOFOL N/A 04/13/2014   Dr. [name]: grade 1-2 varices, hiatal hernia, portal gastropathy. NO need for further surveillance while on prophylaxis unless evidence of bleeding  . ESOPHAGOGASTRODUODENOSCOPY (EGD) WITH PROPOFOL N/A 05/19/2019   erosive reflux esophagitis, Grade 1-2 esophageal  varices with patulous EG junction. Hiatal hernia. Portal gastropathy. Gastric and duodenal dulcer disease.   . MULTIPLE EXTRACTIONS WITH ALVEOLOPLASTY  03/08/2012   Procedure: MULTIPLE EXTRACION WITH ALVEOLOPLASTY;  Surgeon: Gae Bon, DDS;  Location: [place];  Service: Oral Surgery;  Laterality: N/A;    Current Outpatient Medications  Medication Sig Dispense Refill  . famotidine (PEPCID) 20 MG tablet Take 20 mg by mouth 2 (two) times daily.     [name] HYDROcodone-acetaminophen (NORCO/VICODIN) 5-325 MG per tablet Take 1 tablet by mouth every 6 (six) hours as needed for moderate pain.    [name] lisinopril-hydrochlorothiazide (PRINZIDE,ZESTORETIC) 10-12.5 MG per tablet Take 1 tablet by mouth daily.    . meloxicam (MOBIC) 15 MG tablet Take 15 mg by mouth daily.    . nadolol (CORGARD) 40 MG tablet Take 40 mg by mouth daily.     [name] omeprazole (PRILOSEC) 40 MG capsule Take 40 mg by mouth daily as needed.    [name] spironolactone (ALDACTONE) 25 MG tablet TAKE 1 TABLET BY MOUTH DAILY 90 tablet 1   No current facility-administered medications for this visit.    Allergies as of 01/17/2020  . (No Known Allergies)    Family History  Problem Relation Age of Onset  . Diabetes Mother   . Heart disease Mother   . Cancer Paternal Uncle        passed away age 37  . Other Father        died in [place] in 93  . Heart disease Sister   . Anesthesia problems Neg Hx   . Hypotension Neg Hx   . Malignant hyperthermia Neg Hx   . Pseudochol deficiency Neg Hx   . Colon cancer Neg Hx   . Liver disease Neg Hx     Social History   Socioeconomic History  . Marital status: Legally Separated    Spouse name: Not on file  . Number of children: 1  . Years of education: 10th grade  . Highest education level: Not on file  Occupational History  . Occupation: disabled    Fish farm manager: NOT EMPLOYED  Tobacco Use  . Smoking status: Current Every Day Smoker    Packs/day: 0.50    Years: 20.00    Pack years: 10.00    Types:  Cigarettes  . Smokeless tobacco: Never Used  Substance and Sexual Activity  . Alcohol use: No    Comment: last use 2015  . Drug use: No  . Sexual activity: Yes    Birth control/protection: Post-menopausal  Other Topics Concern  . Not on file  Social History Narrative   Lives at home alone.   Right-handed.   Occasional caffeine use.   Social Determinants of Health   Financial Resource Strain:   . Difficulty of Paying Living Expenses: Not on file  Food Insecurity:   . Worried About Charity fundraiser  in the Last Year: Not on file  . Ran Out of Food in the Last Year: Not on file  Transportation Needs:   . Lack of Transportation (Medical): Not on file  . Lack of Transportation (Non-Medical): Not on file  Physical Activity:   . Days of Exercise per Week: Not on file  . Minutes of Exercise per Session: Not on file  Stress:   . Feeling of Stress : Not on file  Social Connections:   . Frequency of Communication with Friends and Family: Not on file  . Frequency of Social Gatherings with Friends and Family: Not on file  . Attends Religious Services: Not on file  . Active Member of Clubs or Organizations: Not on file  . Attends Archivist Meetings: Not on file  . Marital Status: Not on file    Review of Systems: As mentioned in HPI  Physical Exam: BP (!) 174/83   Pulse 75   Temp (!) 97.1 F (36.2 C)   Ht 5\' 4"  (1.626 m)   Wt 165 lb (74.8 kg)   BMI 28.32 kg/m  General:   Alert and oriented. Appears uncomfortable. Holding abdomen sitting in chair.  Head:  Normocephalic and atraumatic. Lungs: clear bilaterally Cardiac: S1 S2 present Abdomen:  +BS, mildly distended upper abdomen, significantly TTP upper abdomen. +REBOUND and guarding. Msk:  limping Extremities:  Without edema. Neurologic:  Alert and  oriented x4 Psych:  Alert and cooperative. Normal mood and affect.  ASSESSMENT: Gabriella Leon is a 64 y.o. female presenting today with history of  cirrhosis, Hep C s/p treatment several years ago with documented SVR, known PUD and overdue for surveillance EGD, presenting with acute on chronic epigastric pain. Associated subjective chills and fever although afebrile today. She is in obvious pain with concerning physical exam. Continues to take Ibuprofen despite known PUD. Extremely poor intake over the last week with reported no solid food intake, loss of appetite, vomiting, no hematemesis. As of note, she also reports shortness of breath for past 2 weeks.  Due to constellation of symptoms, needs ED evaluation for labs, stat imaging. Discussed this with patient who stated understanding. I have contacted the charge RN regarding patient's upcoming arrival.    PLAN:   ED evaluation  Stop Nadolol, as inadvertently taking Propanolol as well: may need to stop propanolol as well after review of labs in ED.  Stop aldactone.   Increase PPI to BID  Will need EGD in an elective setting for surveillance. Will await ED findings first. Will need to request cardiac clearance prior to elective EGD due to history of chest pain, SOB.  Annitta Needs, PhD, ANP-BC [facility]

## 2020-01-17 NOTE — ED Provider Notes (Signed)
Patient received in sign out from C. Couture, PA-C. Patient awaiting completion of urinalysis.  Patient evaluated for abdominal pain. Mild diverticulitis of the terminal ileum. Has received IV fluids, pain medication, cipro and flagyl. She feels somewhat improved and is tolerating po fluids currently. UA without indication of infection. Labs are at baseline.  Results for orders placed or performed during the hospital encounter of 01/17/20  CBC with Differential  Result Value Ref Range   WBC 3.0 (L) 4.0 - 10.5 K/uL   RBC 3.13 (L) 3.87 - 5.11 MIL/uL   Hemoglobin 11.1 (L) 12.0 - 15.0 g/dL   HCT 33.1 (L) 36.0 - 46.0 %   MCV 105.8 (H) 80.0 - 100.0 fL   MCH 35.5 (H) 26.0 - 34.0 pg   MCHC 33.5 30.0 - 36.0 g/dL   RDW 13.4 11.5 - 15.5 %   Platelets 33 (L) 150 - 400 K/uL   nRBC 0.0 0.0 - 0.2 %   Neutrophils Relative % 74 %   Neutro Abs 2.2 1.7 - 7.7 K/uL   Lymphocytes Relative 14 %   Lymphs Abs 0.4 (L) 0.7 - 4.0 K/uL   Monocytes Relative 10 %   Monocytes Absolute 0.3 0.1 - 1.0 K/uL   Eosinophils Relative 1 %   Eosinophils Absolute 0.0 0.0 - 0.5 K/uL   Basophils Relative 1 %   Basophils Absolute 0.0 0.0 - 0.1 K/uL   Immature Granulocytes 0 %   Abs Immature Granulocytes 0.01 0.00 - 0.07 K/uL  Comprehensive metabolic panel  Result Value Ref Range   Sodium 135 135 - 145 mmol/L   Potassium 3.7 3.5 - 5.1 mmol/L   Chloride 101 98 - 111 mmol/L   CO2 26 22 - 32 mmol/L   Glucose, Bld 143 (H) 70 - 99 mg/dL   BUN 14 8 - 23 mg/dL   Creatinine, Ser 1.01 (H) 0.44 - 1.00 mg/dL   Calcium 8.8 (L) 8.9 - 10.3 mg/dL   Total Protein 6.8 6.5 - 8.1 g/dL   Albumin 3.0 (L) 3.5 - 5.0 g/dL   AST 81 (H) 15 - 41 U/L   ALT 38 0 - 44 U/L   Alkaline Phosphatase 118 38 - 126 U/L   Total Bilirubin 4.0 (H) 0.3 - 1.2 mg/dL   GFR calc non Af Amer >60 >60 mL/min   GFR calc Af Amer >60 >60 mL/min   Anion gap 8 5 - 15  Lipase, blood  Result Value Ref Range   Lipase 38 11 - 51 U/L  Urinalysis, Routine w reflex  microscopic  Result Value Ref Range   Color, Urine AMBER (A) YELLOW   APPearance CLEAR CLEAR   Specific Gravity, Urine >1.046 (H) 1.005 - 1.030   pH 6.0 5.0 - 8.0   Glucose, UA NEGATIVE NEGATIVE mg/dL   Hgb urine dipstick NEGATIVE NEGATIVE   Bilirubin Urine NEGATIVE NEGATIVE   Ketones, ur NEGATIVE NEGATIVE mg/dL   Protein, ur NEGATIVE NEGATIVE mg/dL   Nitrite NEGATIVE NEGATIVE   Leukocytes,Ua NEGATIVE NEGATIVE  Troponin I (High Sensitivity)  Result Value Ref Range   Troponin I (High Sensitivity) 5 <18 ng/L   CT ABDOMEN PELVIS W CONTRAST  Result Date: 01/17/2020 CLINICAL DATA:  Abdominal pain. EXAM: CT ABDOMEN AND PELVIS WITH CONTRAST TECHNIQUE: Multidetector CT imaging of the abdomen and pelvis was performed using the standard protocol following bolus administration of intravenous contrast. CONTRAST:  138mL OMNIPAQUE IOHEXOL 300 MG/ML  SOLN COMPARISON:  July 06, 2019 FINDINGS: Lower chest: No acute abnormality. Hepatobiliary:  The liver is cirrhotic in appearance. No focal liver abnormality is seen. Diffuse fatty infiltration of the liver parenchyma is noted. Status post cholecystectomy. Pancreas: Unremarkable. No pancreatic ductal dilatation or surrounding inflammatory changes. Spleen: There is moderate severity splenomegaly. A 2.1 cm x 2.1 cm accessory spleen is seen adjacent to the lateral aspect of the left kidney. Adrenals/Urinary Tract: Adrenal glands are unremarkable. Kidneys are normal in size, without renal calculi or hydronephrosis. 6 mm cysts are seen within the anterior and posterior aspect of the mid left kidney. A 7 mm cyst is seen along the posterolateral aspect of the mid right kidney. Bladder is unremarkable. Stomach/Bowel: Stomach is within normal limits. Appendix appears normal. No evidence of bowel dilatation. Noninflamed diverticula are seen throughout the large bowel. A mildly inflamed diverticulum is seen along the terminal ileum (axial CT images 55 through 61, CT series  number 2/coronal reformatted image 34 through 40, CT series number 5). Vascular/Lymphatic: Marked severity aortic atherosclerosis. Dilated tortuous varices are seen within the anterior aspect of the abdomen, along the midline. No enlarged abdominal or pelvic lymph nodes. Reproductive: A 9 mm x 7 mm hyperdense focus is seen within the region of the uterine fundus (axial CT image 67, CT series number 2). This is not clearly seen on the prior study. Other: No abdominal wall hernia or abnormality. No abdominopelvic ascites. Musculoskeletal: Multilevel degenerative changes are seen throughout the lumbar spine. This is most prominent at the levels of L3-L4, L4-L5 and L5-S1. Moderate to marked severity degenerative changes are seen involving the bilateral hips. IMPRESSION: 1. Mild diverticulitis involving the terminal ileum. 2. Colonic diverticulosis. 3. Cirrhotic, fatty liver with numerous varices. 4. Evidence of prior cholecystectomy. 5. Stable splenomegaly. 6. Small hyperdense focus within the uterus which may represent a small, noncalcified uterine fibroid. Electronically Signed   By: Virgina Norfolk M.D.   On: 01/17/2020 19:39   DG Chest Portable 1 View  Result Date: 01/17/2020 CLINICAL DATA:  Abdominal pain. EXAM: PORTABLE CHEST 1 VIEW COMPARISON:  August 09, 2019 FINDINGS: The heart size and mediastinal contours are within normal limits. Both lungs are clear. The visualized skeletal structures are unremarkable. The visualized portion the abdomen is unremarkable. IMPRESSION: No active disease. Electronically Signed   By: Virgina Norfolk M.D.   On: 01/17/2020 18:51    Patient discharged home with prescriptions for augmentin, zofran, and oxycodone.   Etta Quill, NP 01/17/20 2312    Fredia Sorrow, MD 01/24/20 1540

## 2020-01-17 NOTE — Patient Instructions (Addendum)
I have called the emergency room and spoken to the charge nurse. Please go to the emergency room to have further evaluation, imaging, and blood work.   Unless you are told otherwise from the hospital: for going forward once acute issues are addressed, only take propanolol. Stop Nadolol. These are similar medications. Stop aldactone as well.   We need to increase Prilosec (omeprazole) to twice a day, 30 minutes before breakfast and dinner. It is important to avoid any Ibuprofen, Advil, Aleve, Motrin, etc due to your history of ulcers.  At some point, we need to complete the upper endoscopy. You will need to have cardiac clearance before that. However, today we need to make sure you do not have an acute issue due to your significant abdominal pain. I will keep tabs on what is going on with you in the emergency room. If needed, we can follow you along in the hospital if you need to be admitted.  I am so sorry you are not feeling well!  Annitta Needs, PhD, ANP-BC [facility]

## 2020-01-19 LAB — NOVEL CORONAVIRUS, NAA (HOSP ORDER, SEND-OUT TO REF LAB; TAT 18-24 HRS): SARS-CoV-2, NAA: NOT DETECTED

## 2020-01-24 NOTE — Patient Instructions (Addendum)
DUE TO COVID-19 ONLY ONE VISITOR IS ALLOWED TO COME WITH YOU AND STAY IN THE WAITING ROOM ONLY DURING PRE OP AND PROCEDURE DAY OF SURGERY. THE 1 VISITOR MAY VISIT WITH YOU AFTER SURGERY IN YOUR PRIVATE ROOM DURING VISITING HOURS ONLY!  YOU NEED TO HAVE A COVID 19 TEST ON_2/5______ @__11 :05_____, THIS TEST MUST BE DONE BEFORE SURGERY,  ONCE YOUR COVID TEST IS COMPLETED, PLEASE BEGIN THE QUARANTINE INSTRUCTIONS AS OUTLINED IN YOUR HANDOUT.Go to [place] and walk in for your test                Gabriella Leon   Your procedure is scheduled on: 01/31/20   Report to [facility] Main  Entrance   Report to Short Stay 5:30 AM. See map     Call this number if you have problems the morning of surgery [place], NO [place].   Do not eat food After Midnight.   YOU MAY HAVE CLEAR LIQUIDS FROM MIDNIGHT UNTIL 4:30 AM.   At 4:30 AM Please finish the prescribed Pre-Surgery Gatorade drink.   Nothing by mouth after you finish the Gatorade drink !   Take these medicines the morning of surgery with A SIP OF WATER: Nadolol (Corgard), Pepcid, Protonix  DO NOT TAKE ANY DIABETIC MEDICATIONS DAY OF YOUR SURGERY              YOU  may not have any metal on your body including hair pins and              piercings  Do not wear jewelry, make-up, lotions, powders or perfumes, deodorant             Do not wear nail polish on your fingernails.  Do not shave  48 hours prior to surgery.                 Do not bring valuables to the hospital. [place].  Contacts, dentures or bridgework may not be worn into surgery.                 Please read over the following fact sheets you were given: _____________________________________________________________________             [facility] - Preparing for Surgery  Before surgery, you can play an important  role.   Because skin is not sterile, your skin needs to be as free of germs as possible .  You can reduce the number of germs on your skin by washing with CHG (chlorahexidine gluconate) soap before surgery.   CHG is an antiseptic cleaner which kills germs and bonds with the skin to continue killing germs even after washing. Please DO NOT use if you have an allergy to CHG or antibacterial soaps.   If your skin becomes reddened/irritated stop using the CHG and inform your nurse when you arrive at Short Stay. Do not shave (including legs and underarms) for at least 48 hours prior to the first CHG shower.    Please follow these instructions carefully:  1.  Shower with CHG Soap the night before surgery and the  morning of Surgery.  2.  If you choose to wash your hair, wash your hair first as usual with your  normal  shampoo.  3.  After you shampoo, rinse your hair and body thoroughly to remove the  shampoo.                                        4.  Use CHG as you would any other liquid soap.  You can apply chg directly  to the skin and wash                       Gently with a scrungie or clean washcloth.  5.  Apply the CHG Soap to your body ONLY FROM THE NECK DOWN.   Do not use on face/ open                           Wound or open sores. Avoid contact with eyes, ears mouth and genitals (private parts).                       Wash face,  Genitals (private parts) with your normal soap.             6.  Wash thoroughly, paying special attention to the area where your surgery  will be performed.  7.  Thoroughly rinse your body with warm water from the neck down.  8.  DO NOT shower/wash with your normal soap after using and rinsing off  the CHG Soap.             9.  Pat yourself dry with a clean towel.            10.  Wear clean pajamas.            11.  Place clean sheets on your bed the night of your first shower and do not  sleep with pets. Day of Surgery : Do not apply any lotions/deodorants the  morning of surgery.  Please wear clean clothes to the hospital/surgery center.  FAILURE TO FOLLOW THESE INSTRUCTIONS MAY RESULT IN THE CANCELLATION OF YOUR SURGERY PATIENT SIGNATURE_________________________________  NURSE SIGNATURE__________________________________  ________________________________________________________________________   Adam Phenix  An incentive spirometer is a tool that can help keep your lungs clear and active. This tool measures how well you are filling your lungs with each breath. Taking long deep breaths may help reverse or decrease the chance of developing breathing (pulmonary) problems (especially infection) following:  A long period of time when you are unable to move or be active. BEFORE THE PROCEDURE   If the spirometer includes an indicator to show your best effort, your nurse or respiratory therapist will set it to a desired goal.  If possible, sit up straight or lean slightly forward. Try not to slouch.  Hold the incentive spirometer in an upright position. INSTRUCTIONS FOR USE  1. Sit on the edge of your bed if possible, or sit up as far as you can in bed or on a chair. 2. Hold the incentive spirometer in an upright position. 3. Breathe out normally. 4. Place the mouthpiece in your mouth and seal your lips tightly around it. 5. Breathe in slowly and as deeply as possible, raising the piston or the ball toward the top of the column. 6. Hold your breath for 3-5 seconds or for as long as possible. Allow the piston or ball to fall to the bottom of the column. 7. Remove the mouthpiece from  your mouth and breathe out normally. 8. Rest for a few seconds and repeat Steps 1 through 7 at least 10 times every 1-2 hours when you are awake. Take your time and take a few normal breaths between deep breaths. 9. The spirometer may include an indicator to show your best effort. Use the indicator as a goal to work toward during each repetition. 10. After each  set of 10 deep breaths, practice coughing to be sure your lungs are clear. If you have an incision (the cut made at the time of surgery), support your incision when coughing by placing a pillow or rolled up towels firmly against it. Once you are able to get out of bed, walk around indoors and cough well. You may stop using the incentive spirometer when instructed by your caregiver.  RISKS AND COMPLICATIONS  Take your time so you do not get dizzy or light-headed.  If you are in pain, you may need to take or ask for pain medication before doing incentive spirometry. It is harder to take a deep breath if you are having pain. AFTER USE  Rest and breathe slowly and easily.  It can be helpful to keep track of a log of your progress. Your caregiver can provide you with a simple table to help with this. If you are using the spirometer at home, follow these instructions: [place] IF:   You are having difficultly using the spirometer.  You have trouble using the spirometer as often as instructed.  Your pain medication is not giving enough relief while using the spirometer.  You develop fever of 100.5 F (38.1 C) or higher. SEEK IMMEDIATE MEDICAL CARE IF:   You cough up bloody sputum that had not been present before.  You develop fever of 102 F (38.9 C) or greater.  You develop worsening pain at or near the incision site. MAKE SURE YOU:   Understand these instructions.  Will watch your condition.  Will get help right away if you are not doing well or get worse. Document Released: 04/20/2007 Document Revised: 03/01/2012 Document Reviewed: 06/21/2007 ExitCare Patient Information 2014 ExitCare, [place].   ________________________________________________________________________  WHAT IS A BLOOD TRANSFUSION? Blood Transfusion Information  A transfusion is the replacement of blood or some of its parts. Blood is made up of multiple cells which provide different functions.  Red  blood cells carry oxygen and are used for blood loss replacement.  White blood cells fight against infection.  Platelets control bleeding.  Plasma helps clot blood.  Other blood products are available for specialized needs, such as hemophilia or other clotting disorders. BEFORE THE TRANSFUSION  Who gives blood for transfusions?   Healthy volunteers who are fully evaluated to make sure their blood is safe. This is blood bank blood. Transfusion therapy is the safest it has ever been in the practice of medicine. Before blood is taken from a donor, a complete history is taken to make sure that person has no history of diseases nor engages in risky social behavior (examples are intravenous drug use or sexual activity with multiple partners). The donor's travel history is screened to minimize risk of transmitting infections, such as malaria. The donated blood is tested for signs of infectious diseases, such as HIV and hepatitis. The blood is then tested to be sure it is compatible with you in order to minimize the chance of a transfusion reaction. If you or a relative donates blood, this is often done in anticipation of surgery and is  not appropriate for emergency situations. It takes many days to process the donated blood. RISKS AND COMPLICATIONS Although transfusion therapy is very safe and saves many lives, the main dangers of transfusion include:   Getting an infectious disease.  Developing a transfusion reaction. This is an allergic reaction to something in the blood you were given. Every precaution is taken to prevent this. The decision to have a blood transfusion has been considered carefully by your caregiver before blood is given. Blood is not given unless the benefits outweigh the risks. AFTER THE TRANSFUSION  Right after receiving a blood transfusion, you will usually feel much better and more energetic. This is especially true if your red blood cells have gotten low (anemic). The  transfusion raises the level of the red blood cells which carry oxygen, and this usually causes an energy increase.  The nurse administering the transfusion will monitor you carefully for complications. HOME CARE INSTRUCTIONS  No special instructions are needed after a transfusion. You may find your energy is better. Speak with your caregiver about any limitations on activity for underlying diseases you may have. SEEK MEDICAL CARE IF:   Your condition is not improving after your transfusion.  You develop redness or irritation at the intravenous (IV) site. SEEK IMMEDIATE MEDICAL CARE IF:  Any of the following symptoms occur over the next 12 hours:  Shaking chills.  You have a temperature by mouth above 102 F (38.9 C), not controlled by medicine.  Chest, back, or muscle pain.  People around you feel you are not acting correctly or are confused.  Shortness of breath or difficulty breathing.  Dizziness and fainting.  You get a rash or develop hives.  You have a decrease in urine output.  Your urine turns a dark color or changes to pink, red, or brown. Any of the following symptoms occur over the next 10 days:  You have a temperature by mouth above 102 F (38.9 C), not controlled by medicine.  Shortness of breath.  Weakness after normal activity.  The white part of the eye turns yellow (jaundice).  You have a decrease in the amount of urine or are urinating less often.  Your urine turns a dark color or changes to pink, red, or brown. Document Released: 12/05/2000 Document Revised: 03/01/2012 Document Reviewed: 07/24/2008 [facility] Patient Information 2014 [place], [place].  _______________________________________________________________________

## 2020-01-25 ENCOUNTER — Encounter (HOSPITAL_COMMUNITY): Payer: Self-pay | Admitting: Physician Assistant

## 2020-01-25 ENCOUNTER — Encounter (HOSPITAL_COMMUNITY)
Admission: RE | Admit: 2020-01-25 | Discharge: 2020-01-25 | Disposition: A | Discharge status: Home / Self Care | Payer: Medicare HMO | Source: Ambulatory Visit | Attending: Orthopedic Surgery | Admitting: Orthopedic Surgery

## 2020-01-25 ENCOUNTER — Encounter (HOSPITAL_COMMUNITY): Payer: Self-pay

## 2020-01-25 ENCOUNTER — Other Ambulatory Visit: Payer: Self-pay

## 2020-01-25 NOTE — Progress Notes (Signed)
PCP - Dr. Nemiah Commander Cardiologist - none  Chest x-ray - 01/17/20 EKG - 01/18/20 Stress Test -  ECHO -  Cardiac Cath -   Sleep Study -  CPAP -   Fasting Blood Sugar - Pt has not tested her sugar at home for a year because her meter is broken.A1c s range 4.3-4.9 Checks Blood Sugar __0___ times a day  Blood Thinner Instructions:no Aspirin Instructions: Last Dose:  Anesthesia review:   Patient denies shortness of breath, fever, cough and chest pain at PAT appointment She states that she wants an inhaler.  Patient verbalized understanding of instructions that were given to them at the PAT appointment. Patient was also instructed that they will need to review over the PAT instructions again at home before surgery. Pt was shopping while on the phone for PAT. I will review her instructions with her when she comes in for labs on 01/26/20

## 2020-01-26 ENCOUNTER — Encounter (HOSPITAL_COMMUNITY)
Admission: RE | Admit: 2020-01-26 | Discharge: 2020-01-26 | Disposition: A | Discharge status: Home / Self Care | Payer: Medicare HMO | Source: Ambulatory Visit | Attending: Orthopedic Surgery | Admitting: Orthopedic Surgery

## 2020-01-26 DIAGNOSIS — Z01812 Encounter for preprocedural laboratory examination: Secondary | ICD-10-CM | POA: Diagnosis not present

## 2020-01-26 LAB — CBC
HCT: 31 % — ABNORMAL LOW (ref 36.0–46.0)
Hemoglobin: 10.3 g/dL — ABNORMAL LOW (ref 12.0–15.0)
MCH: 35.2 pg — ABNORMAL HIGH (ref 26.0–34.0)
MCHC: 33.2 g/dL (ref 30.0–36.0)
MCV: 105.8 fL — ABNORMAL HIGH (ref 80.0–100.0)
Platelets: 57 10*3/uL — ABNORMAL LOW (ref 150–400)
RBC: 2.93 MIL/uL — ABNORMAL LOW (ref 3.87–5.11)
RDW: 13.8 % (ref 11.5–15.5)
WBC: 3.4 10*3/uL — ABNORMAL LOW (ref 4.0–10.5)
nRBC: 0 % (ref 0.0–0.2)

## 2020-01-26 LAB — COMPREHENSIVE METABOLIC PANEL
ALT: 41 U/L (ref 0–44)
AST: 89 U/L — ABNORMAL HIGH (ref 15–41)
Albumin: 3.3 g/dL — ABNORMAL LOW (ref 3.5–5.0)
Alkaline Phosphatase: 132 U/L — ABNORMAL HIGH (ref 38–126)
Anion gap: 12 (ref 5–15)
BUN: 11 mg/dL (ref 8–23)
CO2: 28 mmol/L (ref 22–32)
Calcium: 9.4 mg/dL (ref 8.9–10.3)
Chloride: 98 mmol/L (ref 98–111)
Creatinine, Ser: 1.14 mg/dL — ABNORMAL HIGH (ref 0.44–1.00)
GFR calc Af Amer: >60 mL/min (ref >60)
GFR calc non Af Amer: 52 mL/min — ABNORMAL LOW (ref >60)
Glucose, Bld: 123 mg/dL — ABNORMAL HIGH (ref 70–99)
Potassium: 3.8 mmol/L (ref 3.5–5.1)
Sodium: 138 mmol/L (ref 135–145)
Total Bilirubin: 3.8 mg/dL — ABNORMAL HIGH (ref 0.3–1.2)
Total Protein: 7.1 g/dL (ref 6.5–8.1)

## 2020-01-26 LAB — SURGICAL PCR SCREEN
MRSA, PCR: NEGATIVE
Staphylococcus aureus: NEGATIVE

## 2020-01-26 LAB — HEMOGLOBIN A1C
Hgb A1c MFr Bld: 4.3 % — ABNORMAL LOW (ref 4.8–5.6)
Mean Plasma Glucose: 76.71 mg/dL

## 2020-01-27 ENCOUNTER — Other Ambulatory Visit (HOSPITAL_COMMUNITY)
Admission: RE | Admit: 2020-01-27 | Discharge: 2020-01-27 | Disposition: A | Discharge status: Home / Self Care | Payer: Medicare HMO | Source: Ambulatory Visit | Attending: Orthopedic Surgery | Admitting: Orthopedic Surgery

## 2020-01-27 ENCOUNTER — Other Ambulatory Visit: Payer: Self-pay

## 2020-01-27 ENCOUNTER — Other Ambulatory Visit (HOSPITAL_COMMUNITY): Payer: Medicare HMO

## 2020-01-27 DIAGNOSIS — Z01812 Encounter for preprocedural laboratory examination: Secondary | ICD-10-CM | POA: Insufficient documentation

## 2020-01-27 DIAGNOSIS — U071 COVID-19: Secondary | ICD-10-CM | POA: Diagnosis not present

## 2020-01-27 LAB — SARS CORONAVIRUS 2 (TAT 6-24 HRS): SARS Coronavirus 2: POSITIVE — AB

## 2020-01-27 NOTE — Progress Notes (Signed)
Anesthesia Chart Review   Case: [id number] Date/Time: 01/31/20 0715   Procedure: TOTAL HIP ARTHROPLASTY ANTERIOR APPROACH (Left Hip) - 70 mins   Anesthesia type: Spinal   Pre-op diagnosis: Left hip avascular necrosis   [facility] ROOM 10 / [facility]   Surgeons: [name], MD      DISCUSSION:63 y.o. current every day smoker (10 pack years) with h/o DM II, HTN, Hepatitis B, Hepatitis C s/p treatment, cirrhosis, pancytopenia, history of alcoholism, GERD, asthma, left hip OA scheduled for above procedure 01/31/20 with Dr. Norva Pavlov.   Pt scheduled for endoscopy 08/04/2019 which was cancelled due to chest pain.  She was seen in the ED.  Per ED note, "Here patient did be well-appearing with normal vital signs. EKG without acute ischemic changes, serial troponin within normal limits, no delta troponin. Given description of symptoms and above work-up, low suspicion for ACS. Chest x-ray without acute pulmonary pathology. No significant shortness of breath, no hypoxia or tachypnea, doubt pulmonary embolism. More likely etiology related to gastritis, reflux for esophageal spasm. Recommend patient follow-up with her GI doctor as well as her primary care doctor."  Per OV note with GI 08/30/2019 pt still experiencing intermittent chest pain worsened by exertion.  She was referred to cardiologist.  Pt scheduled to see cardiologist 10/25/2019, but cancelled appointment.  Pt will need cardiac evaluation/clearance.   Pt last seen by hematologist, Dr. Delton Coombes, 10/06/2019.  Per OV note, "She is wanting to have left hip surgery for avascular necrosis. -We have given a trial of pulse dexamethasone 40 mg for 4 days on 09/27/2019. -Platelet count improved to 90 from 56. White count improved to 5.7 from 2.2. Hemoglobin also improved to 11.3. -This indicates autoimmune etiology for her pancytopenia. -She may proceed with her left hip surgery. We will optimize her platelet count prior to surgery."     Platelets 57 01/26/20.    Pt has not been seen by cardiology since recommended in October.  She was seen in the ED 1/26 for epigastric pain, treated for diverticulitis with antibiotics.  Per GI she will be scheduled for upper endoscopy once she has a cardiac clearance.  Discussed with Dr. Doroteo Glassman, pt needs cardiac clearance before proceeding with hip replacement.  Discussed with Dr. Aurea Graff scheduler.   VS: BP 126/72 (BP Location: Right Arm)   Pulse 91   Temp 37 C (Oral)   Resp 16   Ht 5\' 4"  (1.626 m)   Wt 75 kg   SpO2 100%   BMI 28.38 kg/m   PROVIDERS: Neale Burly, MD is PCP   Derek Jack, MD Hematologist  LABS: Labs reviewed: Acceptable for surgery. (all labs ordered are listed, but only abnormal results are displayed)  Labs Reviewed  CBC - Abnormal; Notable for the following components:      Result Value   WBC 3.4 (*)    RBC 2.93 (*)    Hemoglobin 10.3 (*)    HCT 31.0 (*)    MCV 105.8 (*)    MCH 35.2 (*)    Platelets 57 (*)    All other components within normal limits  COMPREHENSIVE METABOLIC PANEL - Abnormal; Notable for the following components:   Glucose, Bld 123 (*)    Creatinine, Ser 1.14 (*)    Albumin 3.3 (*)    AST 89 (*)    Alkaline Phosphatase 132 (*)    Total Bilirubin 3.8 (*)    GFR calc non Af Amer 52 (*)    All other  components within normal limits  HEMOGLOBIN A1C - Abnormal; Notable for the following components:   Hgb A1c MFr Bld 4.3 (*)    All other components within normal limits  SURGICAL PCR SCREEN  TYPE AND SCREEN     IMAGES:  Chest Xray 08/04/2019 IMPRESSION: No active cardiopulmonary disease.  EKG: 08/05/2019 Rate 51 bpm Sinus rhythm  Probable anteroseptal infarct, old   CV:  Past Medical History:  Diagnosis Date  . Acid reflux disease with ulcer   . Anxiety   . Arthritis   . Asthma   . Blood dyscrasia    thrombocytopenia-next tx injection 09/25/15  . Blood transfusion   . Chronic kidney disease   .  Cirrhosis ([place])   . DDD (degenerative disc disease), lumbar   . Depression   . Diabetes mellitus   . Esophageal varices ([place]) 09/18/2014  . Folate deficiency 03/28/2014   Noted on 03/23/2014.  Folate 1 mg prescribed.  . Gallstones   . GERD (gastroesophageal reflux disease)   . Headache    migraines  . Helicobacter pylori ab+    July 2010, s/p treatment  . Hepatitis B antibody positive   . Hepatitis C    and B positive antibody  . History of alcoholism ([place])   . History of kidney stones   . Hypertension   . Left leg pain   . Liver cirrhosis ([place])   . Low back pain   . Other pancytopenia ([place]) 10/27/2013  . Ulcer    ?    Past Surgical History:  Procedure Laterality Date  . ANTERIOR CERVICAL DECOMP/DISCECTOMY FUSION N/A 09/28/2015   Procedure: Cervical three-four Anterior cervical decompression/diskectomy/fusion;  Surgeon: Leeroy Cha, MD;  Location: [facility];  Service: Neurosurgery;  Laterality: N/A;  C3-4 Anterior cervical decompression/diskectomy/fusion  . APPENDECTOMY     age 30  . BIOPSY  12/18/2011      . BIOPSY  05/19/2019   Procedure: BIOPSY;  Surgeon: Daneil Dolin, MD;  [facility];  Service: Endoscopy;;  gastric  . [place]  greater than 10 yrs   [place]  . COLONOSCOPY  12/18/11   minimal anal canal hemorrhoids, friable rectal and colonic mucosa, left-sided diverticulosis, repeat in 2022.   . ESOPHAGOGASTRODUODENOSCOPY  12/18/11   3 columns of Grade II esophageal varices, reflux esohpagitis, portal gastropathy, path with chronic gastritis, negative H.pylori, surveillance in June 2014  . ESOPHAGOGASTRODUODENOSCOPY (EGD) WITH PROPOFOL N/A 04/13/2014   Dr. [name]: grade 1-2 varices, hiatal hernia, portal gastropathy. NO need for further surveillance while on prophylaxis unless evidence of bleeding  . ESOPHAGOGASTRODUODENOSCOPY (EGD) WITH PROPOFOL N/A 05/19/2019   erosive reflux esophagitis, Grade 1-2 esophageal  varices with patulous EG junction. Hiatal hernia. Portal gastropathy. Gastric and duodenal dulcer disease.   . MULTIPLE EXTRACTIONS WITH ALVEOLOPLASTY  03/08/2012   Procedure: MULTIPLE EXTRACION WITH ALVEOLOPLASTY;  Surgeon: Gae Bon, DDS;  Location: [place];  Service: Oral Surgery;  Laterality: N/A;    MEDICATIONS: . famotidine (PEPCID) 20 MG tablet  . lisinopril-hydrochlorothiazide (PRINZIDE,ZESTORETIC) 10-12.5 MG per tablet  . meloxicam (MOBIC) 15 MG tablet  . metFORMIN (GLUCOPHAGE) 500 MG tablet  . nadolol (CORGARD) 40 MG tablet  . naproxen sodium (ALEVE) 220 MG tablet  . ondansetron (ZOFRAN) 4 MG tablet  . oxyCODONE (ROXICODONE) 5 MG immediate release tablet  . pantoprazole (PROTONIX) 40 MG tablet  . spironolactone (ALDACTONE) 25 MG tablet   No current facility-administered medications for this encounter.  Maia Plan [facility] Pre-Surgical Testing [phone] 01/27/20  3:45 PM

## 2020-01-28 ENCOUNTER — Telehealth (HOSPITAL_COMMUNITY): Payer: Self-pay | Admitting: Physician Assistant

## 2020-01-28 ENCOUNTER — Telehealth: Payer: Self-pay

## 2020-01-28 ENCOUNTER — Telehealth: Payer: Self-pay | Admitting: Internal Medicine

## 2020-01-28 ENCOUNTER — Telehealth: Payer: Self-pay | Admitting: Nurse Practitioner

## 2020-01-28 NOTE — Telephone Encounter (Signed)
Patient is calling back for her COVID test results. Please advise Cb-

## 2020-01-28 NOTE — Telephone Encounter (Signed)
I called patient to discuss infusion treatment.  Left voicemail to  call back  if interested.

## 2020-01-28 NOTE — Telephone Encounter (Signed)
Called to discuss with Anntoinette Hampton-Johnson about Covid symptoms and the use of bamlanivimab, a monoclonal antibody infusion for those with mild to moderate Covid symptoms and at a high risk of hospitalization.     Pt is qualified for this infusion at the [facility] infusion center due to co-morbid conditions and/or a member of an at-risk group.  Unable to reach patient. Voicemail left with contact informaion.   Patient Active Problem List   Diagnosis Date Noted  . Tobacco abuse 12/22/2019  . Avascular necrosis of bone of left hip ([place]) 12/05/2019  . PUD (peptic ulcer disease) 06/21/2019  . Left hip pain 12/06/2018  . Chronic pain of left knee 12/06/2018  . Chronic left-sided low back pain with left-sided sciatica 12/06/2018  . Vitamin D deficiency 04/14/2016  . Elevated ferritin 02/14/2016  . Cervical stenosis of spinal canal 09/28/2015  . Idiopathic thrombocytopenic purpura ([place]) 08/29/2015  . Thrombocytopenia ([place]) 08/29/2015  . Hepatic cirrhosis due to chronic hepatitis C infection ([place]) 04/03/2015  . Esophageal varices ([place]) 09/18/2014  . Folate deficiency 03/28/2014  . Unspecified constipation 03/22/2014  . Other pancytopenia ([place]) 10/27/2013  . Hypersplenism syndrome 09/18/2013  . Hepatitis C, chronic ([place]) 09/14/2013  . Lower back pain 06/16/2013  . ETOH abuse 03/03/2013  . RUQ pain 11/07/2012  . Epigastric pain 02/04/2012  . Cirrhosis ([place]) 11/26/2011  . Abdominal pain 11/26/2011  . Screening for colon cancer 11/26/2011    Alda Lea, AGPCNP-BC Pager: [phone] Amion: Bjorn Pippin

## 2020-01-28 NOTE — Telephone Encounter (Signed)
Pt given Covid-19 positive results. Discussed mild, moderate and severe symptoms. Advised pt to call 911 for any respiratory issues and/dehydration. Discussed non test criteria for ending self isolation. Pt advised of way to manage symptoms at home and review isolation precautions especially the importance of washing hands frequently and wearing a mask when around others. Pt verbalized understanding.. Will notify [facility]. HD.

## 2020-01-30 ENCOUNTER — Telehealth: Payer: Self-pay | Admitting: Gastroenterology

## 2020-01-30 NOTE — Telephone Encounter (Signed)
Please arrange office visit in next 4 weeks. Currently COVID positive, but we need to follow-up with her once clear to be seen to arrange EGD. Likely will need colonoscopy due to findings on CT from ED recently.

## 2020-01-30 NOTE — Progress Notes (Signed)
01/31/2020 surgery cancelled needs cardiac clearance also COVID positive 01/28/2020

## 2020-01-31 ENCOUNTER — Ambulatory Visit (HOSPITAL_COMMUNITY): Admission: RE | Admit: 2020-01-31 | Payer: Medicare HMO | Source: Home / Self Care | Admitting: Orthopedic Surgery

## 2020-01-31 ENCOUNTER — Encounter (HOSPITAL_COMMUNITY): Admission: RE | Payer: Self-pay | Source: Home / Self Care

## 2020-01-31 ENCOUNTER — Encounter: Payer: Self-pay | Admitting: Internal Medicine

## 2020-01-31 LAB — TYPE AND SCREEN
ABO/RH(D): O NEG
Antibody Screen: NEGATIVE

## 2020-01-31 SURGERY — ARTHROPLASTY, HIP, TOTAL, ANTERIOR APPROACH
Anesthesia: Spinal | Site: Hip | Laterality: Left

## 2020-01-31 NOTE — Telephone Encounter (Signed)
Patient scheduled and letter sent  °

## 2020-02-22 DIAGNOSIS — K2100 Gastro-esophageal reflux disease with esophagitis, without bleeding: Secondary | ICD-10-CM | POA: Diagnosis not present

## 2020-02-22 DIAGNOSIS — I10 Essential (primary) hypertension: Secondary | ICD-10-CM | POA: Diagnosis not present

## 2020-02-22 DIAGNOSIS — E1142 Type 2 diabetes mellitus with diabetic polyneuropathy: Secondary | ICD-10-CM | POA: Diagnosis not present

## 2020-02-22 DIAGNOSIS — Z6826 Body mass index (BMI) 26.0-26.9, adult: Secondary | ICD-10-CM | POA: Diagnosis not present

## 2020-02-22 DIAGNOSIS — M25552 Pain in left hip: Secondary | ICD-10-CM | POA: Diagnosis not present

## 2020-02-22 DIAGNOSIS — Z0000 Encounter for general adult medical examination without abnormal findings: Secondary | ICD-10-CM | POA: Diagnosis not present

## 2020-02-28 NOTE — Progress Notes (Deleted)
history of cirrhosis, Hep C s/p treatment with documented SVR, PUD in May 2020 and need for surveillance EGD. Known Grade 1-2 varices. Prior EGD in Aug 2020 had been cancelled due to chest pain. Followed by Hematology, history of elevated ferritin felt to be related to liver disease. At last appt, she was sent to ED due to abdominal pain, with reported mild diverticulitis involving the TI

## 2020-02-29 ENCOUNTER — Telehealth: Payer: Self-pay | Admitting: Internal Medicine

## 2020-02-29 ENCOUNTER — Ambulatory Visit: Payer: Medicare HMO | Admitting: Gastroenterology

## 2020-02-29 NOTE — Telephone Encounter (Signed)
I spoke with Gabriella Leon the patient lacks resources and it doesn't appear that she is intentionally missing appointments.     Please reschedule her appointment and mail a letter.

## 2020-02-29 NOTE — Telephone Encounter (Signed)
Patient was a no show x several times.  Received multiple no show letters

## 2020-03-01 ENCOUNTER — Encounter: Payer: Self-pay | Admitting: Internal Medicine

## 2020-03-01 NOTE — Telephone Encounter (Signed)
Rescheduled patient and sent her a letter

## 2020-03-21 ENCOUNTER — Other Ambulatory Visit: Payer: Self-pay | Admitting: Nurse Practitioner

## 2020-03-21 DIAGNOSIS — B182 Chronic viral hepatitis C: Secondary | ICD-10-CM

## 2020-03-21 DIAGNOSIS — I8510 Secondary esophageal varices without bleeding: Secondary | ICD-10-CM

## 2020-03-23 ENCOUNTER — Other Ambulatory Visit: Payer: Self-pay | Admitting: Nurse Practitioner

## 2020-03-23 DIAGNOSIS — B182 Chronic viral hepatitis C: Secondary | ICD-10-CM

## 2020-03-23 DIAGNOSIS — I8510 Secondary esophageal varices without bleeding: Secondary | ICD-10-CM

## 2020-03-27 ENCOUNTER — Telehealth: Payer: Self-pay | Admitting: Gastroenterology

## 2020-03-27 NOTE — Telephone Encounter (Signed)
Spoke with pt and pt said she is does take Propranolol and Nadolol.

## 2020-03-27 NOTE — Telephone Encounter (Signed)
I received a refill request for propanolol. It is unclear what patient is on. I believe we had her stop propanolol and only take Nadolol. Just need to clarify.

## 2020-03-28 DIAGNOSIS — E871 Hypo-osmolality and hyponatremia: Secondary | ICD-10-CM | POA: Diagnosis not present

## 2020-03-28 DIAGNOSIS — I5032 Chronic diastolic (congestive) heart failure: Secondary | ICD-10-CM | POA: Diagnosis not present

## 2020-03-28 DIAGNOSIS — R1084 Generalized abdominal pain: Secondary | ICD-10-CM | POA: Diagnosis not present

## 2020-03-28 DIAGNOSIS — R918 Other nonspecific abnormal finding of lung field: Secondary | ICD-10-CM | POA: Diagnosis not present

## 2020-03-28 DIAGNOSIS — F10231 Alcohol dependence with withdrawal delirium: Secondary | ICD-10-CM | POA: Diagnosis not present

## 2020-03-28 DIAGNOSIS — E860 Dehydration: Secondary | ICD-10-CM | POA: Diagnosis not present

## 2020-03-28 DIAGNOSIS — K852 Alcohol induced acute pancreatitis without necrosis or infection: Secondary | ICD-10-CM | POA: Diagnosis not present

## 2020-03-28 DIAGNOSIS — E872 Acidosis: Secondary | ICD-10-CM | POA: Diagnosis not present

## 2020-03-28 DIAGNOSIS — M879 Osteonecrosis, unspecified: Secondary | ICD-10-CM | POA: Diagnosis not present

## 2020-03-28 DIAGNOSIS — R1110 Vomiting, unspecified: Secondary | ICD-10-CM | POA: Diagnosis not present

## 2020-03-28 DIAGNOSIS — J984 Other disorders of lung: Secondary | ICD-10-CM | POA: Diagnosis not present

## 2020-03-28 DIAGNOSIS — E876 Hypokalemia: Secondary | ICD-10-CM | POA: Diagnosis not present

## 2020-03-28 DIAGNOSIS — K7010 Alcoholic hepatitis without ascites: Secondary | ICD-10-CM | POA: Diagnosis not present

## 2020-03-28 DIAGNOSIS — R112 Nausea with vomiting, unspecified: Secondary | ICD-10-CM | POA: Diagnosis not present

## 2020-03-28 DIAGNOSIS — R0602 Shortness of breath: Secondary | ICD-10-CM | POA: Diagnosis not present

## 2020-03-28 DIAGNOSIS — K859 Acute pancreatitis without necrosis or infection, unspecified: Secondary | ICD-10-CM | POA: Diagnosis not present

## 2020-03-28 DIAGNOSIS — N10 Acute tubulo-interstitial nephritis: Secondary | ICD-10-CM | POA: Diagnosis not present

## 2020-03-28 NOTE — Telephone Encounter (Signed)
Lmom, waiting on a return call.  

## 2020-03-28 NOTE — Telephone Encounter (Signed)
Should not be taking both. Please have her only take Nadolol.

## 2020-03-29 NOTE — Telephone Encounter (Signed)
Lmom for pt to call me back. 

## 2020-04-02 NOTE — Telephone Encounter (Signed)
Spoke with pt. Pt is aware that she should only be taking Nadolol and not Propranolol. FYI  Pt wanted AB to know that she was just getting out of the hospital with a kidney infection.

## 2020-04-16 NOTE — Progress Notes (Deleted)
history of cirrhosis, Hep C s/p treatment with documented SVR, PUD in May 2020 and need for surveillance EGD. Known Grade 1-2 varices. Prior EGD in Aug 2020 had been cancelled due to chest pain. Followed by Hematology, history of elevated ferritin felt to be related to liver disease.At last appt, she was sent to ED due to abdominal pain, with reported mild diverticulitis involving the TI

## 2020-04-17 ENCOUNTER — Ambulatory Visit: Payer: Medicare HMO | Admitting: Gastroenterology

## 2020-04-17 ENCOUNTER — Telehealth: Payer: Self-pay | Admitting: Internal Medicine

## 2020-04-17 ENCOUNTER — Encounter: Payer: Self-pay | Admitting: General Practice

## 2020-04-17 NOTE — Telephone Encounter (Signed)
Proceed.

## 2020-04-17 NOTE — Telephone Encounter (Signed)
Patient has been no show more than 5 times.

## 2020-04-17 NOTE — Telephone Encounter (Signed)
Discharge letter mailed  

## 2020-04-17 NOTE — Telephone Encounter (Signed)
Routing to RMR for order to discharge patient from the practice

## 2020-04-19 DIAGNOSIS — Z6824 Body mass index (BMI) 24.0-24.9, adult: Secondary | ICD-10-CM | POA: Diagnosis not present

## 2020-04-19 DIAGNOSIS — K219 Gastro-esophageal reflux disease without esophagitis: Secondary | ICD-10-CM | POA: Diagnosis not present

## 2020-04-19 DIAGNOSIS — K7030 Alcoholic cirrhosis of liver without ascites: Secondary | ICD-10-CM | POA: Diagnosis not present

## 2020-04-19 DIAGNOSIS — M25052 Hemarthrosis, left hip: Secondary | ICD-10-CM | POA: Diagnosis not present

## 2020-05-08 DIAGNOSIS — H5213 Myopia, bilateral: Secondary | ICD-10-CM | POA: Diagnosis not present

## 2020-05-15 ENCOUNTER — Telehealth: Payer: Self-pay | Admitting: Cardiology

## 2020-05-15 ENCOUNTER — Ambulatory Visit (INDEPENDENT_AMBULATORY_CARE_PROVIDER_SITE_OTHER): Payer: Medicare HMO | Admitting: Cardiology

## 2020-05-15 ENCOUNTER — Encounter: Payer: Self-pay | Admitting: Cardiology

## 2020-05-15 ENCOUNTER — Other Ambulatory Visit: Payer: Self-pay

## 2020-05-15 ENCOUNTER — Encounter: Payer: Self-pay | Admitting: *Deleted

## 2020-05-15 VITALS — BP 132/76 | HR 69 | Ht 64.0 in | Wt 160.8 lb

## 2020-05-15 DIAGNOSIS — R079 Chest pain, unspecified: Secondary | ICD-10-CM

## 2020-05-15 DIAGNOSIS — Z01810 Encounter for preprocedural cardiovascular examination: Secondary | ICD-10-CM | POA: Diagnosis not present

## 2020-05-15 NOTE — Progress Notes (Addendum)
Clinical Summary Gabriella Leon is a 64 y.o.female seen as new consult, referred for preoperative evaluation by Dr Sherrie Sport  1. Preoperative evaluation -being considered for hip replacement for avascular necrosis  2. Chest pain - occasional chest pains - pressure midchest, typically 7/10 in severity. +SOB. Lasts about 30 minutes. Can occur at rest or with exertion. Not associated with meals. Ongoing for 1 year, some decreasing severity - 11/2019 CT chest LAD calcifications - with housework can have some SOB, has to sit down which is new for her - exertion limited by hip pain.  CAD risk factors: DM2, HTN, +tobacco at least 35 years, mother with CAD in her 61s  2. Cirrhosis - followed by GI - history of Hep C s/p treatment. Notes also mention history of EtOH abuse - GI notes mention grade 1-2 varices  3. PUD   4. Pancytopenia - followed by heme Past Medical History:  Diagnosis Date  . Acid reflux disease with ulcer   . Anxiety   . Arthritis   . Asthma   . Blood dyscrasia    thrombocytopenia-next tx injection 09/25/15  . Blood transfusion   . Chronic kidney disease   . Cirrhosis ([place])   . DDD (degenerative disc disease), lumbar   . Depression   . Diabetes mellitus   . Esophageal varices ([place]) 09/18/2014  . Folate deficiency 03/28/2014   Noted on 03/23/2014.  Folate 1 mg prescribed.  . Gallstones   . GERD (gastroesophageal reflux disease)   . Headache    migraines  . Helicobacter pylori ab+    July 2010, s/p treatment  . Hepatitis B antibody positive   . Hepatitis C    and B positive antibody  . History of alcoholism ([place])   . History of kidney stones   . Hypertension   . Left leg pain   . Liver cirrhosis ([place])   . Low back pain   . Other pancytopenia ([place]) 10/27/2013  . Ulcer    ?     No Known Allergies   Current Outpatient Medications  Medication Sig Dispense Refill  . famotidine (PEPCID) 20 MG tablet Take 20 mg by mouth 2 (two) times daily.       [name] lisinopril-hydrochlorothiazide (PRINZIDE,ZESTORETIC) 10-12.5 MG per tablet Take 1 tablet by mouth daily.    . meloxicam (MOBIC) 15 MG tablet Take 15 mg by mouth daily.    . metFORMIN (GLUCOPHAGE) 500 MG tablet Take 500 mg by mouth daily with breakfast.    . nadolol (CORGARD) 40 MG tablet Take 40 mg by mouth daily.     . naproxen sodium (ALEVE) 220 MG tablet Take 440 mg by mouth daily as needed (for pain).    . ondansetron (ZOFRAN) 4 MG tablet Take 1 tablet (4 mg total) by mouth every 6 (six) hours. 12 tablet 0  . oxyCODONE (ROXICODONE) 5 MG immediate release tablet Take 1 tablet (5 mg total) by mouth every 6 (six) hours as needed for severe pain. 6 tablet 0  . pantoprazole (PROTONIX) 40 MG tablet Take 40 mg by mouth 2 (two) times daily.    [name] spironolactone (ALDACTONE) 25 MG tablet TAKE 1 TABLET BY MOUTH DAILY (Patient taking differently: Take 25 mg by mouth daily. ) 90 tablet 1   No current facility-administered medications for this visit.     Past Surgical History:  Procedure Laterality Date  . ANTERIOR CERVICAL DECOMP/DISCECTOMY FUSION N/A 09/28/2015   Procedure: Cervical three-four Anterior cervical decompression/diskectomy/fusion;  Surgeon: Stann Mainland  Joya Salm, MD;  Location: [place] [facility];  Service: Neurosurgery;  Laterality: N/A;  C3-4 Anterior cervical decompression/diskectomy/fusion  . APPENDECTOMY     age 44  . BIOPSY  12/18/2011      . BIOPSY  05/19/2019   Procedure: BIOPSY;  Surgeon: Daneil Dolin, MD;  [facility];  Service: Endoscopy;;  gastric  . [place]  greater than 10 yrs   [place]  . COLONOSCOPY  12/18/11   minimal anal canal hemorrhoids, friable rectal and colonic mucosa, left-sided diverticulosis, repeat in 2022.   . ESOPHAGOGASTRODUODENOSCOPY  12/18/11   3 columns of Grade II esophageal varices, reflux esohpagitis, portal gastropathy, path with chronic gastritis, negative H.pylori, surveillance in June 2014  .  ESOPHAGOGASTRODUODENOSCOPY (EGD) WITH PROPOFOL N/A 04/13/2014   Dr. [name]: grade 1-2 varices, hiatal hernia, portal gastropathy. NO need for further surveillance while on prophylaxis unless evidence of bleeding  . ESOPHAGOGASTRODUODENOSCOPY (EGD) WITH PROPOFOL N/A 05/19/2019   erosive reflux esophagitis, Grade 1-2 esophageal varices with patulous EG junction. Hiatal hernia. Portal gastropathy. Gastric and duodenal dulcer disease.   . MULTIPLE EXTRACTIONS WITH ALVEOLOPLASTY  03/08/2012   Procedure: MULTIPLE EXTRACION WITH ALVEOLOPLASTY;  Surgeon: Gae Bon, DDS;  Location: [place] [facility];  Service: Oral Surgery;  Laterality: N/A;     No Known Allergies    Family History  Problem Relation Age of Onset  . Diabetes Mother   . Heart disease Mother   . Cancer Paternal Uncle        passed away age 67  . Other Father        died in [place] in 31  . Heart disease Sister   . Anesthesia problems Neg Hx   . Hypotension Neg Hx   . Malignant hyperthermia Neg Hx   . Pseudochol deficiency Neg Hx   . Colon cancer Neg Hx   . Liver disease Neg Hx      Social History Gabriella Leon reports that she has been smoking cigarettes. She has a 10.00 pack-year smoking history. She has never used smokeless tobacco. [name] reports no history of alcohol use.   Review of Systems CONSTITUTIONAL: No weight loss, fever, chills, weakness or fatigue.  HEENT: Eyes: No visual loss, blurred vision, double vision or yellow sclerae.No hearing loss, sneezing, congestion, runny nose or sore throat.  SKIN: No rash or itching.  CARDIOVASCULAR: per hpi RESPIRATORY: No shortness of breath, cough or sputum.  GASTROINTESTINAL: No anorexia, nausea, vomiting or diarrhea. No abdominal pain or blood.  GENITOURINARY: No burning on urination, no polyuria NEUROLOGICAL: No headache, dizziness, syncope, paralysis, ataxia, numbness or tingling in the extremities. No change in bowel or bladder control.  MUSCULOSKELETAL: No  muscle, back pain, joint pain or stiffness.  LYMPHATICS: No enlarged nodes. No history of splenectomy.  PSYCHIATRIC: No history of depression or anxiety.  ENDOCRINOLOGIC: No reports of sweating, cold or heat intolerance. No polyuria or polydipsia.  [name]   Physical Examination Today's Vitals   05/15/20 0916  BP: 132/76  Pulse: 69  SpO2: 95%  Weight: 160 lb 12.8 oz (72.9 kg)  Height: 5\' 4"  (1.626 m)   Body mass index is 27.6 kg/m.  Gen: resting comfortably, no acute distress HEENT: no scleral icterus, pupils equal round and reactive, no palptable cervical adenopathy,  CV: RRR, no m/r/g, no jvd Resp: Clear to auscultation bilaterally GI: abdomen is soft, non-tender, non-distended, normal bowel sounds, no hepatosplenomegaly MSK: extremities are warm, no edema.  Skin: warm, no rash Neuro:  no focal deficits Psych: appropriate affect     Assessment and Plan  1. Chest pain - unclear etiology, she does have a history of PUD which could be contributing - multiple CAD risk factors as well as LAD calcfications noted on prior chest CT - DOE with mild activities - with symptoms and potential surgery requires further evaluation. Will plan for lexiscan  2. Preoperative evaluation - recent chest pain as reported above. Does not tolerate greater than 4 METs, part of limitations is her chronic hip pain - planning for stress testing as reported above for better risk stratification    F/u pending stress results  Addendum: negative stress test, from a cardiac perspecitve recommend proceeding with hip surgery as planned   Arnoldo Lenis, M.D.

## 2020-05-15 NOTE — Telephone Encounter (Signed)
Pre-cert Verification for the following procedure    LEXISCAN   DATE:   05/17/2020  [place]

## 2020-05-15 NOTE — Patient Instructions (Signed)
Your physician recommends that you schedule a follow-up appointment PENDING TEST RESULTS WITH DR BRANCH  Your physician recommends that you continue on your current medications as directed. Please refer to the Current Medication list given to you today.  Your physician has requested that you have a lexiscan myoview. For further information please visit [email or web address]. Please follow instruction sheet, as given.  Thank you for choosing [facility]!!   

## 2020-05-17 ENCOUNTER — Encounter (HOSPITAL_COMMUNITY)
Admission: RE | Admit: 2020-05-17 | Discharge: 2020-05-17 | Disposition: A | Discharge status: Home / Self Care | Payer: Medicare HMO | Source: Ambulatory Visit | Attending: Cardiology | Admitting: Cardiology

## 2020-05-17 ENCOUNTER — Encounter (HOSPITAL_COMMUNITY): Payer: Self-pay

## 2020-05-17 ENCOUNTER — Encounter (HOSPITAL_BASED_OUTPATIENT_CLINIC_OR_DEPARTMENT_OTHER)
Admission: RE | Admit: 2020-05-17 | Discharge: 2020-05-17 | Disposition: A | Discharge status: Home / Self Care | Payer: Medicare HMO | Source: Ambulatory Visit | Attending: Cardiology | Admitting: Cardiology

## 2020-05-17 ENCOUNTER — Other Ambulatory Visit: Payer: Self-pay

## 2020-05-17 DIAGNOSIS — R079 Chest pain, unspecified: Secondary | ICD-10-CM | POA: Diagnosis not present

## 2020-05-17 HISTORY — DX: Heart failure, unspecified: I50.9

## 2020-05-17 LAB — NM MYOCAR MULTI W/SPECT W/WALL MOTION / EF
LV dias vol: 81 mL (ref 46–106)
LV sys vol: 23 mL
Peak HR: 99 {beats}/min
RATE: 0.34
Rest HR: 85 {beats}/min
SDS: 1
SRS: 0
SSS: 1
TID: 1.27

## 2020-05-17 MED ORDER — TECHNETIUM TC 99M TETROFOSMIN IV KIT
30.0000 | PACK | Freq: Once | INTRAVENOUS | Status: AC | PRN
  Administered 2020-05-17: 30.5 via INTRAVENOUS

## 2020-05-17 MED ORDER — TECHNETIUM TC 99M TETROFOSMIN IV KIT
10.0000 | PACK | Freq: Once | INTRAVENOUS | Status: AC | PRN
  Administered 2020-05-17: 11 via INTRAVENOUS

## 2020-05-17 MED ORDER — REGADENOSON 0.4 MG/5ML IV SOLN
INTRAVENOUS | Status: AC
  Administered 2020-05-17: 0.4 mg via INTRAVENOUS
  Filled 2020-05-17: qty 5

## 2020-05-17 MED ORDER — SODIUM CHLORIDE FLUSH 0.9 % IV SOLN
INTRAVENOUS | Status: AC
  Administered 2020-05-17: 10 mL via INTRAVENOUS
  Filled 2020-05-17: qty 10

## 2020-05-24 ENCOUNTER — Telehealth: Payer: Self-pay | Admitting: Cardiology

## 2020-05-24 NOTE — Telephone Encounter (Signed)
Needs to be resulted, will forward to Dr.Branch

## 2020-05-24 NOTE — Telephone Encounter (Signed)
Normal stress test, no signs of any blockages. Occasional extra heart beats which are benign but sometimes can cause feeling of heart fluttering, does she ever have any symptoms? F/u 6 months. She is ok to proceed with surgery   Zandra Abts MD    Left message for patient to call back

## 2020-05-24 NOTE — Telephone Encounter (Signed)
Asking for test results

## 2020-05-24 NOTE — Telephone Encounter (Signed)
Just resulted   J Audiel Scheiber MD 

## 2020-05-25 NOTE — Telephone Encounter (Signed)
Left message to call back-cc 

## 2020-06-04 ENCOUNTER — Telehealth: Payer: Self-pay | Admitting: Orthopaedic Surgery

## 2020-06-04 NOTE — Telephone Encounter (Signed)
Pt given results,copied pcp

## 2020-06-04 NOTE — Telephone Encounter (Signed)
Pt called stating she was cleared by her cardiologist and would like to proceed with surgery; I did let her speak with Sherri and just making Dr. Erlinda Hong aware.   [phone]

## 2020-06-07 NOTE — Telephone Encounter (Signed)
I called patient to advise she needs follow up with hematologist to get platelet count checked so she can be cleared for surgery.  Per Prince Solian at [facility] anesthesia, platelet count needs to be about 100 before surgery.  While speaking to patient, phone got disconnected.  I called patient back and left voice mail for return call to make sure she understands next step.

## 2020-06-12 ENCOUNTER — Telehealth: Payer: Self-pay | Admitting: Orthopaedic Surgery

## 2020-06-12 NOTE — Telephone Encounter (Signed)
Ok cool  thanks.

## 2020-06-12 NOTE — Telephone Encounter (Signed)
Do we have clearances for surgery? Thanks.

## 2020-06-12 NOTE — Telephone Encounter (Signed)
Patient called.   Her surgery was on hold due to her stress test but now that it's better, she is requesting the process of getting set up for surgery be started.   Call back: [phone]

## 2020-06-12 NOTE — Telephone Encounter (Signed)
Can't have surgery until platelets are higher per anesthesia.  Patient can't remember who she saw for hematology.  I am putting in referral to [facility] so they can call her to schedule appointment.  That appears to be where she went before.

## 2020-06-13 ENCOUNTER — Ambulatory Visit: Payer: Medicare HMO | Admitting: Orthopaedic Surgery

## 2020-06-13 ENCOUNTER — Other Ambulatory Visit (HOSPITAL_COMMUNITY): Payer: Self-pay | Admitting: *Deleted

## 2020-06-13 DIAGNOSIS — D61818 Other pancytopenia: Secondary | ICD-10-CM

## 2020-06-13 DIAGNOSIS — D693 Immune thrombocytopenic purpura: Secondary | ICD-10-CM

## 2020-06-14 ENCOUNTER — Inpatient Hospital Stay (HOSPITAL_COMMUNITY): Payer: Medicare HMO | Attending: Hematology

## 2020-06-15 ENCOUNTER — Telehealth: Payer: Self-pay

## 2020-06-15 ENCOUNTER — Other Ambulatory Visit: Payer: Self-pay | Admitting: Orthopaedic Surgery

## 2020-06-15 ENCOUNTER — Other Ambulatory Visit: Payer: Self-pay

## 2020-06-15 DIAGNOSIS — D61818 Other pancytopenia: Secondary | ICD-10-CM

## 2020-06-15 DIAGNOSIS — D693 Immune thrombocytopenic purpura: Secondary | ICD-10-CM

## 2020-06-15 NOTE — Telephone Encounter (Signed)
Tish from [facility] in [place] called to get the resulting agency changed from [facility] to [place] for blood work. She said the patient went to [facility] today, but was told they had no one to take her blood -- so they sent her to [facility]. Per the chart, it looks like the patient was supposed to have this done 6/29 and was ordered by hematology. As the patient was at [facility], I entered new lab orders for CMET & CBC/diff so she could have them drawn today.   I am unsure about that hematology appointment and what needs to be done with that, nor do I know anything about an upcoming surgery with Dr. Erlinda Hong; therefore, I am sending this message to his surgery scheduler for follow up on Monday.

## 2020-06-16 LAB — COMPLETE METABOLIC PANEL WITH GFR
AG Ratio: 1 (calc) (ref 1.0–2.5)
ALT: 14 U/L (ref 6–29)
AST: 30 U/L (ref 10–35)
Albumin: 3.3 g/dL — ABNORMAL LOW (ref 3.6–5.1)
Alkaline phosphatase (APISO): 179 U/L — ABNORMAL HIGH (ref 37–153)
BUN/Creatinine Ratio: 14 (calc) (ref 6–22)
BUN: 15 mg/dL (ref 7–25)
CO2: 27 mmol/L (ref 20–32)
Calcium: 9 mg/dL (ref 8.6–10.4)
Chloride: 102 mmol/L (ref 98–110)
Creat: 1.1 mg/dL — ABNORMAL HIGH (ref 0.50–0.99)
GFR, Est African American: 63 mL/min/{1.73_m2} (ref >=60)
GFR, Est Non African American: 54 mL/min/{1.73_m2} — ABNORMAL LOW (ref >=60)
Globulin: 3.2 g/dL (calc) (ref 1.9–3.7)
Glucose, Bld: 172 mg/dL — ABNORMAL HIGH (ref 65–99)
Potassium: 4.1 mmol/L (ref 3.5–5.3)
Sodium: 136 mmol/L (ref 135–146)
Total Bilirubin: 1.6 mg/dL — ABNORMAL HIGH (ref 0.2–1.2)
Total Protein: 6.5 g/dL (ref 6.1–8.1)

## 2020-06-16 LAB — CBC WITH DIFFERENTIAL/PLATELET
Absolute Monocytes: 307 cells/uL (ref 200–950)
Basophils Absolute: 49 cells/uL (ref 0–200)
Basophils Relative: 1.7 %
Eosinophils Absolute: 197 cells/uL (ref 15–500)
Eosinophils Relative: 6.8 %
HCT: 31.5 % — ABNORMAL LOW (ref 35.0–45.0)
Hemoglobin: 10.8 g/dL — ABNORMAL LOW (ref 11.7–15.5)
Lymphs Abs: 954 cells/uL (ref 850–3900)
MCH: 32.8 pg (ref 27.0–33.0)
MCHC: 34.3 g/dL (ref 32.0–36.0)
MCV: 95.7 fL (ref 80.0–100.0)
MPV: 12.4 fL (ref 7.5–12.5)
Monocytes Relative: 10.6 %
Neutro Abs: 1392 cells/uL — ABNORMAL LOW (ref 1500–7800)
Neutrophils Relative %: 48 %
Platelets: 77 10*3/uL — ABNORMAL LOW (ref 140–400)
RBC: 3.29 10*6/uL — ABNORMAL LOW (ref 3.80–5.10)
RDW: 13 % (ref 11.0–15.0)
Total Lymphocyte: 32.9 %
WBC: 2.9 10*3/uL — ABNORMAL LOW (ref 3.8–10.8)

## 2020-06-19 ENCOUNTER — Inpatient Hospital Stay (HOSPITAL_COMMUNITY): Payer: Medicare HMO | Admitting: Nurse Practitioner

## 2020-06-26 ENCOUNTER — Other Ambulatory Visit: Payer: Self-pay

## 2020-06-26 ENCOUNTER — Inpatient Hospital Stay (HOSPITAL_COMMUNITY): Payer: Medicare HMO | Attending: Hematology | Admitting: Nurse Practitioner

## 2020-06-26 DIAGNOSIS — R7989 Other specified abnormal findings of blood chemistry: Secondary | ICD-10-CM | POA: Insufficient documentation

## 2020-06-26 DIAGNOSIS — F17210 Nicotine dependence, cigarettes, uncomplicated: Secondary | ICD-10-CM | POA: Diagnosis not present

## 2020-06-26 DIAGNOSIS — Z833 Family history of diabetes mellitus: Secondary | ICD-10-CM | POA: Insufficient documentation

## 2020-06-26 DIAGNOSIS — Z809 Family history of malignant neoplasm, unspecified: Secondary | ICD-10-CM | POA: Insufficient documentation

## 2020-06-26 DIAGNOSIS — Z7984 Long term (current) use of oral hypoglycemic drugs: Secondary | ICD-10-CM | POA: Diagnosis not present

## 2020-06-26 DIAGNOSIS — I110 Hypertensive heart disease with heart failure: Secondary | ICD-10-CM | POA: Insufficient documentation

## 2020-06-26 DIAGNOSIS — Z79899 Other long term (current) drug therapy: Secondary | ICD-10-CM | POA: Insufficient documentation

## 2020-06-26 DIAGNOSIS — Z8249 Family history of ischemic heart disease and other diseases of the circulatory system: Secondary | ICD-10-CM | POA: Insufficient documentation

## 2020-06-26 DIAGNOSIS — F419 Anxiety disorder, unspecified: Secondary | ICD-10-CM | POA: Diagnosis not present

## 2020-06-26 DIAGNOSIS — N189 Chronic kidney disease, unspecified: Secondary | ICD-10-CM | POA: Insufficient documentation

## 2020-06-26 DIAGNOSIS — I509 Heart failure, unspecified: Secondary | ICD-10-CM | POA: Insufficient documentation

## 2020-06-26 DIAGNOSIS — D61818 Other pancytopenia: Secondary | ICD-10-CM | POA: Diagnosis not present

## 2020-06-26 DIAGNOSIS — J45909 Unspecified asthma, uncomplicated: Secondary | ICD-10-CM | POA: Diagnosis not present

## 2020-06-26 DIAGNOSIS — E119 Type 2 diabetes mellitus without complications: Secondary | ICD-10-CM | POA: Diagnosis not present

## 2020-06-26 DIAGNOSIS — K219 Gastro-esophageal reflux disease without esophagitis: Secondary | ICD-10-CM | POA: Diagnosis not present

## 2020-06-26 DIAGNOSIS — F329 Major depressive disorder, single episode, unspecified: Secondary | ICD-10-CM | POA: Diagnosis not present

## 2020-06-26 NOTE — Progress Notes (Signed)
[place], [place] [address]   CLINIC:  Medical Oncology/Hematology  PCP:  Neale Burly, MD [place] [phone]   REASON FOR VISIT: Follow-up for pancytopenia   CURRENT THERAPY: Observation   INTERVAL HISTORY:  [name] 64 y.o. female returns for routine follow-up for pancytopenia.  Patient reports she is doing well since her last visit.  She reports she is still having hip pain and in need of her surgery.  She reports she is going to follow-up with them and schedule her surgery soon.  Otherwise she is doing well. Denies any nausea, vomiting, or diarrhea. Denies any new pains. Had not noticed any recent bleeding such as epistaxis, hematuria or hematochezia. Denies recent chest pain on exertion, shortness of breath on minimal exertion, pre-syncopal episodes, or palpitations. Denies any numbness or tingling in hands or feet. Denies any recent fevers, infections, or recent hospitalizations. Patient reports appetite at 25% and energy level at 0%.  She is maintaining her weight at this time.     REVIEW OF SYSTEMS:  Review of Systems  Constitutional: Positive for fatigue.  Respiratory: Positive for cough.   Gastrointestinal: Positive for nausea.  Musculoskeletal: Positive for arthralgias (Hip pain ).  All other systems reviewed and are negative.    PAST MEDICAL/SURGICAL HISTORY:  Past Medical History:  Diagnosis Date  . Acid reflux disease with ulcer   . Anxiety   . Arthritis   . Asthma   . Blood dyscrasia    thrombocytopenia-next tx injection 09/25/15  . Blood transfusion   . CHF (congestive heart failure) ([place])   . Chronic kidney disease   . Cirrhosis ([place])   . DDD (degenerative disc disease), lumbar   . Depression   . Diabetes mellitus   . Esophageal varices ([place]) 09/18/2014  . Folate deficiency 03/28/2014   Noted on 03/23/2014.  Folate 1 mg prescribed.  . Gallstones   . GERD  (gastroesophageal reflux disease)   . Headache    migraines  . Helicobacter pylori ab+    July 2010, s/p treatment  . Hepatitis B antibody positive   . Hepatitis C    and B positive antibody  . History of alcoholism ([place])   . History of kidney stones   . Hypertension   . Left leg pain   . Liver cirrhosis ([place])   . Low back pain   . Other pancytopenia ([place]) 10/27/2013  . Ulcer    ?   Past Surgical History:  Procedure Laterality Date  . ANTERIOR CERVICAL DECOMP/DISCECTOMY FUSION N/A 09/28/2015   Procedure: Cervical three-four Anterior cervical decompression/diskectomy/fusion;  Surgeon: Leeroy Cha, MD;  Location: [place] NEURO ORS;  Service: Neurosurgery;  Laterality: N/A;  C3-4 Anterior cervical decompression/diskectomy/fusion  . APPENDECTOMY     age 83  . BIOPSY  12/18/2011      . BIOPSY  05/19/2019   Procedure: BIOPSY;  Surgeon: Daneil Dolin, MD;  [facility];  Service: Endoscopy;;  gastric  . [place]  greater than 10 yrs   [place]  . COLONOSCOPY  12/18/11   minimal anal canal hemorrhoids, friable rectal and colonic mucosa, left-sided diverticulosis, repeat in 2022.   . ESOPHAGOGASTRODUODENOSCOPY  12/18/11   3 columns of Grade II esophageal varices, reflux esohpagitis, portal gastropathy, path with chronic gastritis, negative H.pylori, surveillance in June 2014  . ESOPHAGOGASTRODUODENOSCOPY (EGD) WITH PROPOFOL N/A 04/13/2014  Dr. [name]: grade 1-2 varices, hiatal hernia, portal gastropathy. NO need for further surveillance while on prophylaxis unless evidence of bleeding  . ESOPHAGOGASTRODUODENOSCOPY (EGD) WITH PROPOFOL N/A 05/19/2019   erosive reflux esophagitis, Grade 1-2 esophageal varices with patulous EG junction. Hiatal hernia. Portal gastropathy. Gastric and duodenal dulcer disease.   . MULTIPLE EXTRACTIONS WITH ALVEOLOPLASTY  03/08/2012   Procedure: MULTIPLE EXTRACION WITH ALVEOLOPLASTY;  Surgeon: Gae Bon, DDS;   Location: [place] [facility];  Service: Oral Surgery;  Laterality: N/A;     SOCIAL HISTORY:  Social History   Socioeconomic History  . Marital status: Legally Separated    Spouse name: Not on file  . Number of children: 1  . Years of education: 10th grade  . Highest education level: Not on file  Occupational History  . Occupation: disabled    Fish farm manager: NOT EMPLOYED  Tobacco Use  . Smoking status: Current Every Day Smoker    Packs/day: 0.50    Years: 20.00    Pack years: 10.00    Types: Cigarettes  . Smokeless tobacco: Never Used  Vaping Use  . Vaping Use: Never used  Substance and Sexual Activity  . Alcohol use: No    Comment: last use 2015  . Drug use: No  . Sexual activity: Yes    Birth control/protection: Post-menopausal  Other Topics Concern  . Not on file  Social History Narrative   Lives at home alone.   Right-handed.   Occasional caffeine use.   Social Determinants of Health   Financial Resource Strain:   . Difficulty of Paying Living Expenses:   Food Insecurity:   . Worried About Charity fundraiser in the Last Year:   . Arboriculturist in the Last Year:   Transportation Needs:   . Film/video editor (Medical):   [name] Lack of Transportation (Non-Medical):   Physical Activity:   . Days of Exercise per Week:   . Minutes of Exercise per Session:   Stress:   . Feeling of Stress :   Social Connections:   . Frequency of Communication with Friends and Family:   . Frequency of Social Gatherings with Friends and Family:   . Attends Religious Services:   . Active Member of Clubs or Organizations:   . Attends Archivist Meetings:   [name] Marital Status:   Intimate Partner Violence:   . Fear of Current or Ex-Partner:   . Emotionally Abused:   [name] Physically Abused:   . Sexually Abused:     FAMILY HISTORY:  Family History  Problem Relation Age of Onset  . Diabetes Mother   . Heart disease Mother   . Cancer Paternal Uncle        passed away age 42  . Other  Father        died in [place] in 29  . Heart disease Sister   . Anesthesia problems Neg Hx   . Hypotension Neg Hx   . Malignant hyperthermia Neg Hx   . Pseudochol deficiency Neg Hx   . Colon cancer Neg Hx   . Liver disease Neg Hx     CURRENT MEDICATIONS:  Outpatient Encounter Medications as of 06/26/2020  Medication Sig  . DULoxetine (CYMBALTA) 30 MG capsule Take 30 mg by mouth daily.  . famotidine (PEPCID) 20 MG tablet Take 20 mg by mouth 2 (two) times daily.   [name] lisinopril-hydrochlorothiazide (PRINZIDE,ZESTORETIC) 10-12.5 MG per tablet Take 1 tablet by mouth daily.  . meloxicam (MOBIC) 15 MG  tablet Take 15 mg by mouth daily.  . metFORMIN (GLUCOPHAGE) 500 MG tablet Take 500 mg by mouth daily with breakfast.  . nadolol (CORGARD) 40 MG tablet Take 40 mg by mouth daily.   . ondansetron (ZOFRAN) 4 MG tablet Take 1 tablet (4 mg total) by mouth every 6 (six) hours.  . pantoprazole (PROTONIX) 40 MG tablet Take 40 mg by mouth 2 (two) times daily.  [name] spironolactone (ALDACTONE) 25 MG tablet TAKE 1 TABLET BY MOUTH DAILY (Patient taking differently: Take 25 mg by mouth daily. )  . thiamine (HM VITAMIN B1) 100 MG tablet Take 100 mg by mouth daily.  . naproxen sodium (ALEVE) 220 MG tablet Take 440 mg by mouth daily as needed (for pain). (Patient not taking: Reported on 06/26/2020)  . oxyCODONE (ROXICODONE) 5 MG immediate release tablet Take 1 tablet (5 mg total) by mouth every 6 (six) hours as needed for severe pain. (Patient not taking: Reported on 06/26/2020)   No facility-administered encounter medications on file as of 06/26/2020.    ALLERGIES:  No Known Allergies   PHYSICAL EXAM:  ECOG Performance status: 1  Vitals:   06/26/20 1019  BP: (!) 164/76  Pulse: 74  Resp: 18  Temp: 98.3 F (36.8 C)  SpO2: 98%   Filed Weights   06/26/20 1019  Weight: 161 lb 3.2 oz (73.1 kg)   Physical Exam Constitutional:      Appearance: Normal appearance. She is normal weight.  Cardiovascular:     Rate  and Rhythm: Normal rate and regular rhythm.     Heart sounds: Normal heart sounds.  Pulmonary:     Effort: Pulmonary effort is normal.     Breath sounds: Normal breath sounds.  Abdominal:     General: Bowel sounds are normal.     Palpations: Abdomen is soft.  Musculoskeletal:        General: Normal range of motion.  Skin:    General: Skin is warm.  Neurological:     Mental Status: She is alert and oriented to person, place, and time. Mental status is at baseline.  Psychiatric:        Mood and Affect: Mood normal.        Behavior: Behavior normal.        Thought Content: Thought content normal.        Judgment: Judgment normal.      LABORATORY DATA:  I have reviewed the labs as listed.  CBC    Component Value Date/Time   WBC 2.9 (L) 06/15/2020 1646   RBC 3.29 (L) 06/15/2020 1646   HGB 10.8 (L) 06/15/2020 1646   HGB 11.6 04/14/2016 1326   HCT 31.5 (L) 06/15/2020 1646   HCT 34.2 (L) 04/14/2016 1326   PLT 77 (L) 06/15/2020 1646   PLT 49 (L) 04/14/2016 1326   MCV 95.7 06/15/2020 1646   MCV 102.4 (H) 04/14/2016 1326   MCH 32.8 06/15/2020 1646   MCHC 34.3 06/15/2020 1646   RDW 13.0 06/15/2020 1646   RDW 13.5 04/14/2016 1326   LYMPHSABS 954 06/15/2020 1646   LYMPHSABS 0.7 (L) 04/14/2016 1326   MONOABS 0.3 01/17/2020 1745   MONOABS 0.3 04/14/2016 1326   EOSABS 197 06/15/2020 1646   EOSABS 0.1 04/14/2016 1326   BASOSABS 49 06/15/2020 1646   BASOSABS 0.0 04/14/2016 1326   CMP Latest Ref Rng & Units 06/15/2020 01/26/2020 01/17/2020  Glucose 65 - 99 mg/dL 172(H) 123(H) 143(H)  BUN 7 - 25 mg/dL 15 11 14  Creatinine 0.50 - 0.99 mg/dL 1.10(H) 1.14(H) 1.01(H)  Sodium 135 - 146 mmol/L 136 138 135  Potassium 3.5 - 5.3 mmol/L 4.1 3.8 3.7  Chloride 98 - 110 mmol/L 102 98 101  CO2 20 - 32 mmol/L 27 28 26   Calcium 8.6 - 10.4 mg/dL 9.0 9.4 8.8(L)  Total Protein 6.1 - 8.1 g/dL 6.5 7.1 6.8  Total Bilirubin 0.2 - 1.2 mg/dL 1.6(H) 3.8(H) 4.0(H)  Alkaline Phos 38 - 126 U/L - 132(H) 118    AST 10 - 35 U/L 30 89(H) 81(H)  ALT 6 - 29 U/L 14 41 38    All questions were answered to patient's stated satisfaction. Encouraged patient to call with any new concerns or questions before his next visit to the cancer center and we can certain see him sooner, if needed.     ASSESSMENT & PLAN:  Other pancytopenia 1.  Pancytopenia: -CT scan on 07/06/2019 shows normal spleen.  Does not report any recurrent infections.  No B symptoms. -She had a decrease in platelet count and white count since 2012. -Bone marrow biopsy on 08/31/2019 showed mildly hypocellular marrow with mild erythroid hyperplasia.  No significant dysplasia.  Aspirate was spicular and cellular.  Mild reactive increase in erythroid precursors.  Mild reactive disease and granulocytic precursors.  Megakaryocytes are present and morphologically unremarkable.  MDS FISH panel is negative. -She is wanting to have left hip surgery for avascular necrosis. -We have given a trial of dexamethasone 40 mg for 4 days on 09/27/2019. -Platelet count improved from 56 to 90.  White count improved from 2.2 to 5.7.  Hemoglobin also improved to 11.3. -This indicates autoimmune etiology for her pancytopenia. -Labs done on 06/15/2020 showed platelet count 77, hemoglobin 10.8, WBC 2.9. -She is waiting for her surgery to be scheduled.  We will prescribe dexamethasone prior to her surgery. -She will follow-up in 6 months with repeat labs.  2.  Elevated ferritin levels: -This is from history of hep C and cirrhosis.  Also has a history of alcohol abuse, quit completely 8 years ago. -CTAP on 07/06/2019 shows nodular contour of the liver with cirrhosis.  Spleen is normal.  Significant varices present.  No significant ascites. -Hemochromatosis labs were negative.  Elevated ferritin likely secondary to hep C and liver disease. -Labs done on 06/15/2020 showed AST 30, ALT 14, total bilirubin 1.6 all have improved since last visit.  3.  Tobacco abuse: -Current  active smoker, smokes half to 1 pack/day for the last 36 years. -Low-dose CT scan done on 11/29/2019 was lung RADS 2 follow-up in 1 year. -We will schedule her follow-up low-dose CT scan at her next visit.     Orders placed this encounter:  Orders Placed This Encounter  Procedures  . Lactate dehydrogenase  . CBC with Differential/Platelet  . Comprehensive metabolic panel  . Ferritin  . Iron and TIBC  . Vitamin B12  . VITAMIN D 25 Hydroxy (Vit-D Deficiency, Fractures)      Francene Finders, FNP-C [place] [phone]

## 2020-06-26 NOTE — Assessment & Plan Note (Addendum)
1.  Pancytopenia: -CT scan on 07/06/2019 shows normal spleen.  Does not report any recurrent infections.  No B symptoms. -She had a decrease in platelet count and white count since 2012. -Bone marrow biopsy on 08/31/2019 showed mildly hypocellular marrow with mild erythroid hyperplasia.  No significant dysplasia.  Aspirate was spicular and cellular.  Mild reactive increase in erythroid precursors.  Mild reactive disease and granulocytic precursors.  Megakaryocytes are present and morphologically unremarkable.  MDS FISH panel is negative. -She is wanting to have left hip surgery for avascular necrosis. -We have given a trial of dexamethasone 40 mg for 4 days on 09/27/2019. -Platelet count improved from 56 to 90.  White count improved from 2.2 to 5.7.  Hemoglobin also improved to 11.3. -This indicates autoimmune etiology for her pancytopenia. -Labs done on 06/15/2020 showed platelet count 77, hemoglobin 10.8, WBC 2.9. -She is waiting for her surgery to be scheduled.  We will prescribe dexamethasone prior to her surgery. -She will follow-up in 6 months with repeat labs.  2.  Elevated ferritin levels: -This is from history of hep C and cirrhosis.  Also has a history of alcohol abuse, quit completely 8 years ago. -CTAP on 07/06/2019 shows nodular contour of the liver with cirrhosis.  Spleen is normal.  Significant varices present.  No significant ascites. -Hemochromatosis labs were negative.  Elevated ferritin likely secondary to hep C and liver disease. -Labs done on 06/15/2020 showed AST 30, ALT 14, total bilirubin 1.6 all have improved since last visit.  3.  Tobacco abuse: -Current active smoker, smokes half to 1 pack/day for the last 36 years. -Low-dose CT scan done on 11/29/2019 was lung RADS 2 follow-up in 1 year. -We will schedule her follow-up low-dose CT scan at her next visit.

## 2020-07-09 ENCOUNTER — Other Ambulatory Visit: Payer: Self-pay

## 2020-07-19 ENCOUNTER — Telehealth: Payer: Self-pay | Admitting: Emergency Medicine

## 2020-07-19 NOTE — Telephone Encounter (Signed)
Pt is requesting a refill on her pantoprazole 40mg  bid sent into [facility] 90 day supply

## 2020-07-19 NOTE — Telephone Encounter (Signed)
Called pt to notify her. Vm not set up

## 2020-07-19 NOTE — Telephone Encounter (Signed)
Unfortunately, she has been dismissed from our practice. Needs to establish GI care with another practice.

## 2020-07-20 NOTE — Telephone Encounter (Signed)
Called unable to lmom vm not set up

## 2020-07-23 ENCOUNTER — Encounter: Payer: Self-pay | Admitting: Emergency Medicine

## 2020-07-23 NOTE — Telephone Encounter (Signed)
Called pt unable to lmom. Vm not set up.letter sent

## 2020-07-24 ENCOUNTER — Other Ambulatory Visit (HOSPITAL_COMMUNITY): Payer: Self-pay | Admitting: Nurse Practitioner

## 2020-07-24 DIAGNOSIS — M25052 Hemarthrosis, left hip: Secondary | ICD-10-CM | POA: Diagnosis not present

## 2020-07-24 DIAGNOSIS — Z0000 Encounter for general adult medical examination without abnormal findings: Secondary | ICD-10-CM | POA: Diagnosis not present

## 2020-07-24 DIAGNOSIS — Z131 Encounter for screening for diabetes mellitus: Secondary | ICD-10-CM | POA: Diagnosis not present

## 2020-07-24 DIAGNOSIS — K7030 Alcoholic cirrhosis of liver without ascites: Secondary | ICD-10-CM | POA: Diagnosis not present

## 2020-07-24 DIAGNOSIS — K219 Gastro-esophageal reflux disease without esophagitis: Secondary | ICD-10-CM | POA: Diagnosis not present

## 2020-07-24 DIAGNOSIS — Z6825 Body mass index (BMI) 25.0-25.9, adult: Secondary | ICD-10-CM | POA: Diagnosis not present

## 2020-07-24 DIAGNOSIS — K2100 Gastro-esophageal reflux disease with esophagitis, without bleeding: Secondary | ICD-10-CM | POA: Diagnosis not present

## 2020-07-24 DIAGNOSIS — D693 Immune thrombocytopenic purpura: Secondary | ICD-10-CM

## 2020-07-24 DIAGNOSIS — E119 Type 2 diabetes mellitus without complications: Secondary | ICD-10-CM | POA: Diagnosis not present

## 2020-07-24 MED ORDER — DEXAMETHASONE 4 MG PO TABS: 40.0000 mg | ORAL_TABLET | Freq: Every day | ORAL | 0 refills | Status: AC

## 2020-07-25 NOTE — Pre-Procedure Instructions (Signed)
Gabriella Leon  07/25/2020      [facility] - [place], [place] [address] [place] [address] Phone: [phone] Fax: [phone]    Your procedure is scheduled on Aug. 9  Report to [facility] entrance A at 7:55 A.M.  Call this number if you have problems the morning of surgery:  [phone]   Remember:   Do not eat after midnight.   You may drink clear liquids until 6:55 A.M..  Clear liquids allowed are:                    Water, Juice (non-citric and without pulp - diabetics please choose diet or no sugar options), Carbonated beverages - (diabetics please choose diet or no sugar options), Clear Tea, Black Coffee only (no creamer, milk or cream including half and half), Plain Jell-O only (diabetics please choose diet or no sugar options), Gatorade (diabetics please choose diet or no sugar options) and Plain Popsicles only                                            Enhanced Recovery after Surgery for Orthopedics Enhanced Recovery after Surgery is a protocol used to improve the stress on your body and your recovery after surgery.  Patient Instructions  . The night before surgery:  o No food after midnight. ONLY clear liquids after midnight   . The day of surgery (if you have diabetes): o  o Drink ONE (1) Gatorade 2 (G2) by _____ the morning of surgery o This drink was given to you during your hospital  pre-op appointment visit.  o Color of the Gatorade may vary. Red is not allowed. o Nothing else to drink after completing the  Gatorade 2 (G2).         If you have questions, please contact your surgeon's office.    Take these medicines the morning of surgery with A SIP OF WATER :               Duloxetine (cymbalta)              Famotidine (pepcid)              Nadolol (corgard)              Pantoprazole (protonix)               If needed: ondansetron (zofran), oxycodone          7 days prior to surgery STOP taking any Aspirin  (unless otherwise instructed by your surgeon), Aleve, Naproxen, Ibuprofen, Motrin, Advil, Goody's, BC's, all herbal medications, fish oil, and all vitamins, meloxicam (mobic).                      How to Manage Your Diabetes Before and After Surgery  Why is it important to control my blood sugar before and after surgery? . Improving blood sugar levels before and after surgery helps healing and can limit problems. . A way of improving blood sugar control is eating a healthy diet by: o  Eating less sugar and carbohydrates o  Increasing activity/exercise o  Talking with your doctor about reaching your blood sugar goals . High blood sugars (greater than 180 mg/dL) can raise your risk of infections and slow your recovery, so you will  need to focus on controlling your diabetes during the weeks before surgery. . Make sure that the doctor who takes care of your diabetes knows about your planned surgery including the date and location.  How do I manage my blood sugar before surgery? . Check your blood sugar at least 4 times a day, starting 2 days before surgery, to make sure that the level is not too high or low. o Check your blood sugar the morning of your surgery when you wake up and every 2 hours until you get to the Short Stay unit. . If your blood sugar is less than 70 mg/dL, you will need to treat for low blood sugar: o Do not take insulin. o Treat a low blood sugar (less than 70 mg/dL) with  cup of clear juice (cranberry or apple), 4 glucose tablets, OR glucose gel. Recheck blood sugar in 15 minutes after treatment (to make sure it is greater than 70 mg/dL). If your blood sugar is not greater than 70 mg/dL on recheck, call [phone] o  for further instructions. . Report your blood sugar to the short stay nurse when you get to Short Stay.  . If you are admitted to the hospital after surgery: o Your blood sugar will be checked by the staff and you will probably be given insulin after  surgery (instead of oral diabetes medicines) to make sure you have good blood sugar levels. o The goal for blood sugar control after surgery is 80-180 mg/dL.      WHAT DO I DO ABOUT MY DIABETES MEDICATION?   [name] Do not take oral diabetes medicines (pills) the morning of surgery. ( metformin/glucophage)        Do not wear jewelry, make-up or nail polish.  Do not wear lotions, powders, or perfumes, or deodorant.  Do not shave 48 hours prior to surgery.  Men may shave face and neck.  Do not bring valuables to the hospital.  [facility] is not responsible for any belongings or valuables.  Contacts, dentures or bridgework may not be worn into surgery.  Leave your suitcase in the car.  After surgery it may be brought to your room.  For patients admitted to the hospital, discharge time will be determined by your treatment team.  Patients discharged the day of surgery will not be allowed to drive home.    Special instructions:  [facility]- Preparing For Surgery  Before surgery, you can play an important role. Because skin is not sterile, your skin needs to be as free of germs as possible. You can reduce the number of germs on your skin by washing with CHG (chlorahexidine gluconate) Soap before surgery.  CHG is an antiseptic cleaner which kills germs and bonds with the skin to continue killing germs even after washing.    Oral Hygiene is also important to reduce your risk of infection.  Remember - BRUSH YOUR TEETH THE MORNING OF SURGERY WITH YOUR REGULAR TOOTHPASTE  Please do not use if you have an allergy to CHG or antibacterial soaps. If your skin becomes reddened/irritated stop using the CHG.  Do not shave (including legs and underarms) for at least 48 hours prior to first CHG shower. It is OK to shave your face.  Please follow these instructions carefully.   1. Shower the NIGHT BEFORE SURGERY and the MORNING OF SURGERY with CHG.   2. If you chose to wash your hair, wash your hair  first as usual with your normal shampoo.  3.  After you shampoo, rinse your hair and body thoroughly to remove the shampoo.  4. Use CHG as you would any other liquid soap. You can apply CHG directly to the skin and wash gently with a scrungie or a clean washcloth.   5. Apply the CHG Soap to your body ONLY FROM THE NECK DOWN.  Do not use on open wounds or open sores. Avoid contact with your eyes, ears, mouth and genitals (private parts). Wash Face and genitals (private parts)  with your normal soap.  6. Wash thoroughly, paying special attention to the area where your surgery will be performed.  7. Thoroughly rinse your body with warm water from the neck down.  8. DO NOT shower/wash with your normal soap after using and rinsing off the CHG Soap.  9. Pat yourself dry with a CLEAN TOWEL.  10. Wear CLEAN PAJAMAS to bed the night before surgery, wear comfortable clothes the morning of surgery  11. Place CLEAN SHEETS on your bed the night of your first shower and DO NOT SLEEP WITH PETS.    Day of Surgery:  Do not apply any deodorants/lotions.  Please wear clean clothes to the hospital/surgery center.   Remember to brush your teeth WITH YOUR REGULAR TOOTHPASTE.    Please read over the following fact sheets that you were given.

## 2020-07-26 ENCOUNTER — Other Ambulatory Visit (HOSPITAL_COMMUNITY): Payer: Medicare HMO

## 2020-07-26 ENCOUNTER — Encounter (HOSPITAL_COMMUNITY)
Admission: RE | Admit: 2020-07-26 | Discharge: 2020-07-26 | Disposition: A | Discharge status: Home / Self Care | Payer: Medicare HMO | Source: Ambulatory Visit | Attending: Physician Assistant | Admitting: Physician Assistant

## 2020-07-26 ENCOUNTER — Encounter (HOSPITAL_COMMUNITY)
Admission: RE | Admit: 2020-07-26 | Discharge: 2020-07-26 | Disposition: A | Discharge status: Home / Self Care | Payer: Medicare HMO | Source: Ambulatory Visit | Attending: Orthopaedic Surgery | Admitting: Orthopaedic Surgery

## 2020-07-26 ENCOUNTER — Other Ambulatory Visit: Payer: Self-pay

## 2020-07-26 DIAGNOSIS — M1612 Unilateral primary osteoarthritis, left hip: Secondary | ICD-10-CM | POA: Insufficient documentation

## 2020-07-26 DIAGNOSIS — I700 Atherosclerosis of aorta: Secondary | ICD-10-CM | POA: Diagnosis not present

## 2020-07-26 DIAGNOSIS — Z01818 Encounter for other preprocedural examination: Secondary | ICD-10-CM | POA: Insufficient documentation

## 2020-07-26 LAB — CBC WITH DIFFERENTIAL/PLATELET
Abs Immature Granulocytes: 0.01 10*3/uL (ref 0.00–0.07)
Basophils Absolute: 0 10*3/uL (ref 0.0–0.1)
Basophils Relative: 0 %
Eosinophils Absolute: 0 10*3/uL (ref 0.0–0.5)
Eosinophils Relative: 0 %
HCT: 33 % — ABNORMAL LOW (ref 36.0–46.0)
Hemoglobin: 10.9 g/dL — ABNORMAL LOW (ref 12.0–15.0)
Immature Granulocytes: 0 %
Lymphocytes Relative: 15 %
Lymphs Abs: 0.4 10*3/uL — ABNORMAL LOW (ref 0.7–4.0)
MCH: 33.9 pg (ref 26.0–34.0)
MCHC: 33 g/dL (ref 30.0–36.0)
MCV: 102.5 fL — ABNORMAL HIGH (ref 80.0–100.0)
Monocytes Absolute: 0.1 10*3/uL (ref 0.1–1.0)
Monocytes Relative: 3 %
Neutro Abs: 2.1 10*3/uL (ref 1.7–7.7)
Neutrophils Relative %: 82 %
Platelets: 68 10*3/uL — ABNORMAL LOW (ref 150–400)
RBC: 3.22 MIL/uL — ABNORMAL LOW (ref 3.87–5.11)
RDW: 13.4 % (ref 11.5–15.5)
WBC: 2.6 10*3/uL — ABNORMAL LOW (ref 4.0–10.5)
nRBC: 0 % (ref 0.0–0.2)

## 2020-07-26 LAB — URINALYSIS, ROUTINE W REFLEX MICROSCOPIC
Bacteria, UA: NONE SEEN
Bilirubin Urine: NEGATIVE
Glucose, UA: NEGATIVE mg/dL
Ketones, ur: NEGATIVE mg/dL
Leukocytes,Ua: NEGATIVE
Nitrite: NEGATIVE
Protein, ur: NEGATIVE mg/dL
Specific Gravity, Urine: 1.026 (ref 1.005–1.030)
pH: 5 (ref 5.0–8.0)

## 2020-07-26 LAB — COMPREHENSIVE METABOLIC PANEL
ALT: 26 U/L (ref 0–44)
AST: 47 U/L — ABNORMAL HIGH (ref 15–41)
Albumin: 3.3 g/dL — ABNORMAL LOW (ref 3.5–5.0)
Alkaline Phosphatase: 168 U/L — ABNORMAL HIGH (ref 38–126)
Anion gap: 9 (ref 5–15)
BUN: 19 mg/dL (ref 8–23)
CO2: 25 mmol/L (ref 22–32)
Calcium: 9.2 mg/dL (ref 8.9–10.3)
Chloride: 103 mmol/L (ref 98–111)
Creatinine, Ser: 1.2 mg/dL — ABNORMAL HIGH (ref 0.44–1.00)
GFR calc Af Amer: 56 mL/min — ABNORMAL LOW (ref >60)
GFR calc non Af Amer: 49 mL/min — ABNORMAL LOW (ref >60)
Glucose, Bld: 229 mg/dL — ABNORMAL HIGH (ref 70–99)
Potassium: 3.9 mmol/L (ref 3.5–5.1)
Sodium: 137 mmol/L (ref 135–145)
Total Bilirubin: 1.8 mg/dL — ABNORMAL HIGH (ref 0.3–1.2)
Total Protein: 7.3 g/dL (ref 6.5–8.1)

## 2020-07-26 LAB — TYPE AND SCREEN
ABO/RH(D): O NEG
Antibody Screen: NEGATIVE

## 2020-07-26 LAB — PREALBUMIN: Prealbumin: 9.1 mg/dL — ABNORMAL LOW (ref 18–38)

## 2020-07-26 LAB — PROTIME-INR
INR: 1.3 — ABNORMAL HIGH (ref 0.8–1.2)
Prothrombin Time: 15.3 seconds — ABNORMAL HIGH (ref 11.4–15.2)

## 2020-07-26 LAB — GLUCOSE, CAPILLARY: Glucose-Capillary: 284 mg/dL — ABNORMAL HIGH (ref 70–99)

## 2020-07-26 LAB — APTT: aPTT: 31 seconds (ref 24–36)

## 2020-07-26 LAB — HEMOGLOBIN A1C
Hgb A1c MFr Bld: 4.7 % — ABNORMAL LOW (ref 4.8–5.6)
Mean Plasma Glucose: 88.19 mg/dL

## 2020-07-26 NOTE — Progress Notes (Signed)
Patient was a no call no show to her Preadmission appt.  Attempted calling her twice. No answer and voicemail not set up.

## 2020-07-26 NOTE — Progress Notes (Addendum)
PCP - Dr. Stoney Bang Cardiologist - Dr. Dorisann Frames in [place]  Chest x-ray - 07/26/20 EKG - 07/26/20 Stress Test - 05/17/20  ECHO - Denies Cardiac Cath - Denies  Sleep Study - Denies  Fasting Blood Sugar -  Averages 119-150's CBG 284 at PAT appt had fruit punch and nabs just before Checks Blood Sugar every other day  ERAS Protcol -Given instruction PRE-SURGERY  G2- given  COVID TEST- rescheduled to 07/28/20 @ 1200  Patient was lost, arrived to appt 1330.   Anesthesia review: Yes abn EKG / pancytopenia  Patient denies shortness of breath, fever, cough and chest pain at PAT appointment   All instructions explained to the patient, with a verbal understanding of the material. Patient agrees to go over the instructions while at home for a better understanding. Patient also instructed to self quarantine after being tested for COVID-19. The opportunity to ask questions was provided.

## 2020-07-27 ENCOUNTER — Encounter (HOSPITAL_COMMUNITY): Payer: Self-pay | Admitting: Vascular Surgery

## 2020-07-27 ENCOUNTER — Telehealth: Payer: Self-pay | Admitting: *Deleted

## 2020-07-27 ENCOUNTER — Other Ambulatory Visit: Payer: Self-pay | Admitting: Physician Assistant

## 2020-07-27 LAB — SURGICAL PCR SCREEN
MRSA, PCR: POSITIVE — AB
Staphylococcus aureus: POSITIVE — AB

## 2020-07-27 MED ORDER — OXYCODONE-ACETAMINOPHEN 5-325 MG PO TABS: 1.0000 | ORAL_TABLET | Freq: Four times a day (QID) | ORAL | 0 refills | Status: DC | PRN

## 2020-07-27 MED ORDER — ONDANSETRON HCL 4 MG PO TABS: 4.0000 mg | ORAL_TABLET | Freq: Three times a day (TID) | ORAL | 0 refills | Status: DC | PRN

## 2020-07-27 MED ORDER — ASPIRIN EC 81 MG PO TBEC: 81.0000 mg | DELAYED_RELEASE_TABLET | Freq: Two times a day (BID) | ORAL | 0 refills | Status: DC

## 2020-07-27 MED ORDER — METHOCARBAMOL 500 MG PO TABS: 500.0000 mg | ORAL_TABLET | Freq: Two times a day (BID) | ORAL | 0 refills | Status: DC | PRN

## 2020-07-27 MED ORDER — SULFAMETHOXAZOLE-TRIMETHOPRIM 800-160 MG PO TABS: 1.0000 | ORAL_TABLET | Freq: Two times a day (BID) | ORAL | 0 refills | Status: DC

## 2020-07-27 MED ORDER — BUPIVACAINE LIPOSOME 1.3 % IJ SUSP
10.0000 mL | Freq: Once | INTRAMUSCULAR | Status: DC
  Filled 2020-07-27: qty 10

## 2020-07-27 MED ORDER — DOCUSATE SODIUM 100 MG PO CAPS: 100.0000 mg | ORAL_CAPSULE | Freq: Every day | ORAL | 2 refills | Status: AC | PRN

## 2020-07-27 MED ORDER — TRANEXAMIC ACID 1000 MG/10ML IV SOLN
2000.0000 mg | INTRAVENOUS | Status: DC
  Filled 2020-07-27: qty 20

## 2020-07-27 NOTE — Telephone Encounter (Signed)
Ortho bundle pre-op call attempted.

## 2020-07-27 NOTE — Progress Notes (Signed)
Fyi- low prealbumin and hemoglobin

## 2020-07-27 NOTE — Care Plan (Signed)
Attempted pre-op call to patient. No answer and no VM set up. Will re-try later today as well as over the weekend prior to surgery.

## 2020-07-27 NOTE — Progress Notes (Signed)
Anesthesia Chart Review:  Case: [id number] Date/Time: 07/30/20 0942   Procedure: LEFT TOTAL HIP ARTHROPLASTY ANTERIOR APPROACH (Left Hip)   Anesthesia type: Spinal   Pre-op diagnosis: left hip avascular necrosis   Location: [facility] OR ROOM 04 / [facility]   Surgeons: Leandrew Koyanagi, MD      DISCUSSION: Patient is a 64 year old female scheduled for the above procedure. Left THA was initially scheduled at [facility] with [name], MD but was postponed due to need for cardiology follow-up. She has now been cleared by cardiologist Dr. Harl Bowie, and surgery rescheduled at [facility] with Dr. Erlinda Hong. She also has Hepatitis C with cirrhosis and autoimmune pancytopenia (previously responsive to steroids), so hematology has prescribed a 4 day course of dexamethasone in hopes to increase PLT count prior to surgery.   History includes smoking, DM2, HTN, Hepatitis C (s/p treatment), hepatitis B antibody positive, cirrhosis (with grade 1-2 esophageal varices), pancytopenia (thought to be autoimmune in etiology, as steroid responsive in 09/2019), alcohol abuse (sober since ~ 2015), GERD, hiatal hernia, CHF (not well documented, not mentioned in 05/15/20 cardiology note), CKD, anxiety, depression, C3-4 ACDF (09/28/15).  Last hematology visit was on 06/26/20 by Francene Finders, NP-C for follow-up pancytopenia. To summarize, patient has had decreased in PLT and WBC since 2012. 07/06/19 CT showed normal spleen. Bone marrow biopsy on 08/31/19 showed "mildly hypocellular marrow with mild erythroid hyperplasia.  No significant dysplasia.  Aspirate was spicular and cellular.  Mild reactive increase in erythroid precursors.  Mild reactive disease and granulocytic precursors.  Megakaryocytes are present and morphologically unremarkable.  MDS FISH panel is negative." In 09/2019, she was given a trial of dexamethasone 40 mg for 4 days with PLT count improving from 56-90K and WBC increasing from 2.2 to 5.7 and HGB to 11.3 suggesting autoimmune etiology for her  pancytopenia. Given plans for THA, they will prescribe dexamethasone again prior to surgery. She also has a history of elevated ferritin levels felt related to hepatitis C and cirrhosis history. She had a history of alcohol abuse, but quit ~ 2015. EGD 05/19/19 showed erosive reflux esophagitis and grade 1-2 esophageal varies. She is getting surveillance chest CT due to her smoking history.   PLT count 68K on 07/26/20, but with improvement up to 90K in 09/2019 after a 4 day course of dexamethasone. Hematology has prescribed 40 mg daily x 4 days, to take 07/24/20-07/28/20. Will order a STAT repeat CBC for the day of surgery to see if she is responsive. Will recheck PT/INR since has been only intermittently elevated in the past. She is being considered for spinal anesthesia, but previously discussed with Sherrie at Dr. Phoebe Sharps office that anesthesiologist may opt for general anesthesia depending of her lab results. I attempted to contact patient to confirm she was taking the dexamethasone (it is listed on her medication record), but her voicemail was not set up. I sent a staff message to Dr. Erlinda Hong above.    She has known history of at least intermittent ectopic atrial rhythm (noted on 01/17/20 EKG, 05/17/20 stress test, 07/26/20 EKG).    Preoperative COVID-19 test is scheduled for 07/28/2020.  Anesthesia team to evaluate on the day of surgery.  Definitive anesthesia plan following review of same-day labs and patient evaluation.   VS: BP (!) 155/65   Pulse 77   Temp 36.7 C (Oral)   Resp 17   Ht _0  (1.626 m)   Wt 74 kg   SpO2 100%   BMI 28.01 kg/m  PROVIDERS: Neale Burly, MD is PCP  Derek Jack, MD Hematologist Carlyle Dolly, MD is Cardiologist. Preoperative evaluation on 05/15/20.  Manus Rudd, MD was GI; However, dismissed from practice as of 04/17/20 after multiple no-shows.     LABS: Preoperative labs noted. Cr 1.20. Glucose 229 with A1c 4.7%. PT/INR mildly elevated at 15.3/1.3 with  normal PTT of 31. WBC 2.6 which appears within her usual range over the past year. H/H 10.9/33.0 which is consistent with previous results. AST 47, ALT 26, total bili 1.8 which are consistent with patient trends. PLT count 68K (range of 33-77K since 01/17/20).  (all labs ordered are listed, but only abnormal results are displayed)  Labs Reviewed  SURGICAL PCR SCREEN - Abnormal; Notable for the following components:      Result Value   MRSA, PCR POSITIVE (*)    Staphylococcus aureus POSITIVE (*)    All other components within normal limits  GLUCOSE, CAPILLARY - Abnormal; Notable for the following components:   Glucose-Capillary 284 (*)    All other components within normal limits  PREALBUMIN - Abnormal; Notable for the following components:   Prealbumin 9.1 (*)    All other components within normal limits  CBC WITH DIFFERENTIAL/PLATELET - Abnormal; Notable for the following components:   WBC 2.6 (*)    RBC 3.22 (*)    Hemoglobin 10.9 (*)    HCT 33.0 (*)    MCV 102.5 (*)    Platelets 68 (*)    Lymphs Abs 0.4 (*)    All other components within normal limits  COMPREHENSIVE METABOLIC PANEL - Abnormal; Notable for the following components:   Glucose, Bld 229 (*)    Creatinine, Ser 1.20 (*)    Albumin 3.3 (*)    AST 47 (*)    Alkaline Phosphatase 168 (*)    Total Bilirubin 1.8 (*)    GFR calc non Af Amer 49 (*)    GFR calc Af Amer 56 (*)    All other components within normal limits  PROTIME-INR - Abnormal; Notable for the following components:   Prothrombin Time 15.3 (*)    INR 1.3 (*)    All other components within normal limits  URINALYSIS, ROUTINE W REFLEX MICROSCOPIC - Abnormal; Notable for the following components:   Color, Urine AMBER (*)    Hgb urine dipstick SMALL (*)    All other components within normal limits  HEMOGLOBIN A1C - Abnormal; Notable for the following components:   Hgb A1c MFr Bld 4.7 (*)    All other components within normal limits  APTT  TYPE AND SCREEN     OTHER: EGD 05/19/19: IMPRESSION: -Erosive reflux esophagitis. Grade 1 2 esophageal varices -Patulous EG junction. Hiatal hernia. Portal gastropathy. Gastric and duodenal ulcer disease no specimens collected. That is post gastric biopsy.   IMAGES: CXR 07/26/20: FINDINGS: Heart size within normal limits.  Aortic atherosclerosis. There is no appreciable airspace consolidation. No evidence of pleural effusion or pneumothorax. No acute bony abnormality identified. ACDF hardware within the cervical spine. IMPRESSION: No evidence of active cardiopulmonary disease. Aortic Atherosclerosis (ICD10-I70.0).  CT Abd/pelvis 01/17/20: IMPRESSION: 1. Mild diverticulitis involving the terminal ileum. 2. Colonic diverticulosis. 3. Cirrhotic, fatty liver with numerous varices. 4. Evidence of prior cholecystectomy. 5. Stable splenomegaly. 6. Small hyperdense focus within the uterus which may represent a small, noncalcified uterine fibroid.  CT Chest lung cancer screening 11/29/19: IMPRESSION: 1. Lung-RADS 2, benign appearance or behavior. Continue annual screening with low-dose chest CT without contrast in 12  months. 2. Emphysema and aortic atherosclerosis.   EKG: EKG 07/26/20: Automated reading: Unusual P axis and short PR, probable junctional tachycardia with Premature atrial complexes Septal infarct , age undetermined Abnormal ECG - Awaiting cardiology to confirm, but does appears to be ectopic atrial rhythm (which has been present on 01/17/20 tracing and noted during 05/17/20 stress test.  EKG 01/17/20: Sinus or ectopic atrial rhythm Borderline short PR interval Borderline right axis deviation Probable anteroseptal infarct, old Baseline wander in lead(s) V3 V4 No significant change since last tracing Confirmed by Fredia Sorrow [phone]) on 01/17/2020 6:27:03 PM   CV: Nuclear stress test 05/17/20:  No diagnostic ST segment changes to indicate ischemia. Frequent PACs noted as well  as ectopic atrial rhythm.  No significant myocardial perfusion defects to indicate focal scar or ischemia. Borderline increased TID ratio of 1.27, cannot completely exclude balanced subendocardial ischemia.  This is a low risk study based on the absence of significant, reversible focal perfusion defects, borderline increased TID ratio not withstanding.  Nuclear stress EF: 72%. Reviewed by Dr. Harl Bowie on 05/24/20, "Normal stress test, no signs of any blockages. Occasional extra heart beats which are benign but sometimes can cause feeling of heart fluttering, does she ever have any symptoms? F/u 6 months. She is ok to proceed with surgery".   Past Medical History:  Diagnosis Date  . Acid reflux disease with ulcer   . Anxiety   . Arthritis   . Asthma   . Blood dyscrasia    thrombocytopenia-next tx injection 09/25/15  . Blood transfusion   . CHF (congestive heart failure) ([place])   . Chronic kidney disease   . Cirrhosis ([place])   . DDD (degenerative disc disease), lumbar   . Depression   . Diabetes mellitus   . Esophageal varices ([place]) 09/18/2014  . Folate deficiency 03/28/2014   Noted on 03/23/2014.  Folate 1 mg prescribed.  . Gallstones   . GERD (gastroesophageal reflux disease)   . Headache    migraines  . Helicobacter pylori ab+    July 2010, s/p treatment  . Hepatitis B antibody positive   . Hepatitis C    and B positive antibody  . History of alcoholism ([place])   . History of kidney stones   . Hypertension   . Left leg pain   . Liver cirrhosis ([place])   . Low back pain   . Other pancytopenia ([place]) 10/27/2013  . Ulcer    ?    Past Surgical History:  Procedure Laterality Date  . ANTERIOR CERVICAL DECOMP/DISCECTOMY FUSION N/A 09/28/2015   Procedure: Cervical three-four Anterior cervical decompression/diskectomy/fusion;  Surgeon: Leeroy Cha, MD;  Location: [facility]URO ORS;  Service: Neurosurgery;  Laterality: N/A;  C3-4 Anterior cervical decompression/diskectomy/fusion  . APPENDECTOMY      age 59  . BIOPSY  12/18/2011      . BIOPSY  05/19/2019   Procedure: BIOPSY;  Surgeon: Daneil Dolin, MD;  Location: [facility];  Service: Endoscopy;;  gastric  . [place]  greater than 10 yrs   [place]  . COLONOSCOPY  12/18/11   minimal anal canal hemorrhoids, friable rectal and colonic mucosa, left-sided diverticulosis, repeat in 2022.   . ESOPHAGOGASTRODUODENOSCOPY  12/18/11   3 columns of Grade II esophageal varices, reflux esohpagitis, portal gastropathy, path with chronic gastritis, negative H.pylori, surveillance in June 2014  . ESOPHAGOGASTRODUODENOSCOPY (EGD) WITH PROPOFOL N/A 04/13/2014   Dr. [name]: grade 1-2 varices, hiatal hernia, portal  gastropathy. NO need for further surveillance while on prophylaxis unless evidence of bleeding  . ESOPHAGOGASTRODUODENOSCOPY (EGD) WITH PROPOFOL N/A 05/19/2019   erosive reflux esophagitis, Grade 1-2 esophageal varices with patulous EG junction. Hiatal hernia. Portal gastropathy. Gastric and duodenal dulcer disease.   . MULTIPLE EXTRACTIONS WITH ALVEOLOPLASTY  03/08/2012   Procedure: MULTIPLE EXTRACION WITH ALVEOLOPLASTY;  Surgeon: Gae Bon, DDS;  Location: [facility];  Service: Oral Surgery;  Laterality: N/A;    MEDICATIONS: . dexamethasone (DECADRON) 4 MG tablet  . DULoxetine (CYMBALTA) 30 MG capsule  . famotidine (PEPCID) 20 MG tablet  . lisinopril-hydrochlorothiazide (PRINZIDE,ZESTORETIC) 10-12.5 MG per tablet  . meloxicam (MOBIC) 15 MG tablet  . metFORMIN (GLUCOPHAGE) 500 MG tablet  . nadolol (CORGARD) 40 MG tablet  . naproxen sodium (ALEVE) 220 MG tablet  . ondansetron (ZOFRAN) 4 MG tablet  . oxyCODONE (ROXICODONE) 5 MG immediate release tablet  . pantoprazole (PROTONIX) 40 MG tablet  . spironolactone (ALDACTONE) 25 MG tablet   No current facility-administered medications for this encounter.   Derrill Memo ON 07/30/2020] bupivacaine liposome (EXPAREL) 1.3 % injection 133 mg  . [START ON  07/30/2020] tranexamic acid (CYKLOKAPRON) 2,000 mg in sodium chloride 0.9 % 50 mL Topical Application    Myra Gianotti, PA-C Surgical Short Stay/Anesthesiology [facility] Phone [phone] [facility] Phone [phone] 07/27/2020 10:42 AM

## 2020-07-27 NOTE — Anesthesia Preprocedure Evaluation (Deleted)
Anesthesia Evaluation    Airway        Dental   Pulmonary Current Smoker,           Cardiovascular hypertension,      Neuro/Psych    GI/Hepatic   Endo/Other  diabetes  Renal/GU      Musculoskeletal   Abdominal   Peds  Hematology   Anesthesia Other Findings   Reproductive/Obstetrics                             Anesthesia Physical Anesthesia Plan  ASA:   Anesthesia Plan:    Post-op Pain Management:    Induction:   PONV Risk Score and Plan:   Airway Management Planned:   Additional Equipment:   Intra-op Plan:   Post-operative Plan:   Informed Consent:   Plan Discussed with:   Anesthesia Plan Comments: (See PAT note written 07/27/2020 by Myra Gianotti, PA-C. Has cardiology clearance from Dr. Harl Bowie. Hematology prescribed 4 day course of dexamethasone for autoimmune pancytopenia. She is for repeat CBC and PT/INR on the day of surgery. Also history of treated hepatitis C and liver cirrhosis.  )        Anesthesia Quick Evaluation

## 2020-07-28 ENCOUNTER — Other Ambulatory Visit (HOSPITAL_COMMUNITY): Payer: Medicare HMO

## 2020-07-30 ENCOUNTER — Telehealth: Payer: Self-pay | Admitting: Radiology

## 2020-07-30 ENCOUNTER — Encounter (HOSPITAL_COMMUNITY): Admission: RE | Payer: Self-pay | Source: Home / Self Care

## 2020-07-30 ENCOUNTER — Ambulatory Visit (HOSPITAL_COMMUNITY): Admission: RE | Admit: 2020-07-30 | Payer: Medicare HMO | Source: Home / Self Care | Admitting: Orthopaedic Surgery

## 2020-07-30 SURGERY — ARTHROPLASTY, HIP, TOTAL, ANTERIOR APPROACH
Anesthesia: Spinal | Site: Hip | Laterality: Left

## 2020-07-30 NOTE — Telephone Encounter (Signed)
Patient called with questions in regards to why her surgery was cancelled for today.  I spoke with Dondra Spry, who had spoken with patient. Patient's albumin is low and this needs to be taken care of prior to surgery. I explained to patient and advised she should follow up with whomever treats her for cirrhosis or her PCP. She expressed understanding.

## 2020-08-14 ENCOUNTER — Inpatient Hospital Stay: Payer: Medicare HMO | Admitting: Orthopaedic Surgery

## 2020-08-15 DIAGNOSIS — Z6825 Body mass index (BMI) 25.0-25.9, adult: Secondary | ICD-10-CM | POA: Diagnosis not present

## 2020-08-15 DIAGNOSIS — M25052 Hemarthrosis, left hip: Secondary | ICD-10-CM | POA: Diagnosis not present

## 2020-08-16 ENCOUNTER — Telehealth: Payer: Self-pay | Admitting: Orthopaedic Surgery

## 2020-08-16 NOTE — Telephone Encounter (Signed)
Prealbumin is an indication of nutritional status.  Low prealbumin puts her at significant risk for nonhealing and complications with the wound such as infection.  She needs to see her PCP about normalizing this before undergoing surgery.

## 2020-08-16 NOTE — Telephone Encounter (Signed)
Patient called.   She is requesting we get in touch with her primary care doctor. He does not understand how or why she was denied from having the surgery.

## 2020-08-16 NOTE — Telephone Encounter (Signed)
  Prealbumin was low.  Dr. Erlinda Hong, please advise how to proceed.  Thanks!

## 2020-08-17 NOTE — Telephone Encounter (Signed)
I called patient to explain this as I have several times before.  She said her PCP wants to speak with you about this and is asking you to call Dr. Angel Weedon Sport [phone].  Thanks!

## 2020-08-20 NOTE — Telephone Encounter (Signed)
Spoke with Dr. Amanda Pea.  Please have her come in for an appointment and we need to discuss everything.  Thanks.

## 2020-08-20 NOTE — Telephone Encounter (Signed)
I called patient and made appointment for 08/28/20 2:15 p.m. with Dr. Erlinda Hong.

## 2020-08-28 ENCOUNTER — Ambulatory Visit: Payer: Medicare HMO | Admitting: Orthopaedic Surgery

## 2020-09-05 ENCOUNTER — Ambulatory Visit: Payer: Medicare HMO | Admitting: Orthopaedic Surgery

## 2020-09-12 ENCOUNTER — Ambulatory Visit (INDEPENDENT_AMBULATORY_CARE_PROVIDER_SITE_OTHER): Payer: Medicare HMO | Admitting: Orthopaedic Surgery

## 2020-09-12 ENCOUNTER — Encounter: Payer: Self-pay | Admitting: Orthopaedic Surgery

## 2020-09-12 DIAGNOSIS — M87052 Idiopathic aseptic necrosis of left femur: Secondary | ICD-10-CM

## 2020-09-12 NOTE — Progress Notes (Signed)
Office Visit Note   Patient: Gabriella Leon           Date of Birth: 08-31-1956           MRN: [id number] Visit Date: 09/12/2020              Requested by: Neale Burly, MD [place],  [place] [id number] PCP: Neale Burly, MD   Assessment & Plan: Visit Diagnoses:  1. Avascular necrosis of bone of left hip (HCC)     Plan: Impression is left hip avascular necrosis and degenerative joint disease.  I explained to her why we canceled her surgery.  At this point I will contact her PCP to see if her prealbumin is correctable or not.  If it is correctable obviously we will wait until the prealbumin normalizes but if Dr. Sherrie Sport does not feel that it is correctable then I will need to have a thorough discussion with the patient about the potential risks related to having an abnormally low prealbumin with total hip replacement.  I will contact the patient once I hear back from Dr. Sherrie Sport about this issue. Total face to face encounter time was greater than 25 minutes and over half of this time was spent in counseling and/or coordination of care.   Follow-Up Instructions: Return if symptoms worsen or fail to improve.   Orders:  No orders of the defined types were placed in this encounter.  No orders of the defined types were placed in this encounter.     Procedures: No procedures performed   Clinical Data: No additional findings.   Subjective: Chief Complaint  Patient presents with   Left Hip - Pain    Ms. Wynetta Emery comes in today for follow-up of severe left hip pain due to AVN.  She states the pain is 10 out of 10 and constant.  We canceled her surgery earlier this year due to low prealbumin.  Patient has a prior history of alcoholism and questionable current alcoholism.  Patient denies any current alcohol use but conversations with his PCP that I have had indicate that she may still be using alcohol.  No changes in medical history otherwise.   Review of  Systems  Constitutional: Negative.   HENT: Negative.   Eyes: Negative.   Respiratory: Negative.   Cardiovascular: Negative.   Endocrine: Negative.   Musculoskeletal: Negative.   Neurological: Negative.   Hematological: Negative.   Psychiatric/Behavioral: Negative.   All other systems reviewed and are negative.    Objective: Vital Signs: There were no vitals taken for this visit.  Physical Exam Vitals and nursing note reviewed.  Constitutional:      Appearance: She is well-developed.  Pulmonary:     Effort: Pulmonary effort is normal.  Skin:    General: Skin is warm.     Capillary Refill: Capillary refill takes less than 2 seconds.  Neurological:     Mental Status: She is alert and oriented to person, place, and time.  Psychiatric:        Behavior: Behavior normal.        Thought Content: Thought content normal.        Judgment: Judgment normal.     Ortho Exam Left hip exam is unchanged. Specialty Comments:  No specialty comments available.  Imaging: No results found.   PMFS History: Patient Active Problem List   Diagnosis Date Noted   Tobacco abuse 12/22/2019   Avascular necrosis of bone of left hip ([place]) 12/05/2019  PUD (peptic ulcer disease) 06/21/2019   Left hip pain 12/06/2018   Chronic pain of left knee 12/06/2018   Chronic left-sided low back pain with left-sided sciatica 12/06/2018   Vitamin D deficiency 04/14/2016   Elevated ferritin 02/14/2016   Cervical stenosis of spinal canal 09/28/2015   Idiopathic thrombocytopenic purpura ([place]) 08/29/2015   Thrombocytopenia ([place]) 08/29/2015   Hepatic cirrhosis due to chronic hepatitis C infection ([place]) 04/03/2015   Esophageal varices ([place]) 09/18/2014   Folate deficiency 03/28/2014   Unspecified constipation 03/22/2014   Other pancytopenia ([place]) 10/27/2013   Hypersplenism syndrome 09/18/2013   Hepatitis C, chronic ([place]) 09/14/2013   Lower back pain 06/16/2013   ETOH abuse 03/03/2013     RUQ pain 11/07/2012   Epigastric pain 02/04/2012   Cirrhosis ([place]) 11/26/2011   Abdominal pain 11/26/2011   Screening for colon cancer 11/26/2011   Past Medical History:  Diagnosis Date   Acid reflux disease with ulcer    Anxiety    Arthritis    Asthma    Blood dyscrasia    thrombocytopenia-next tx injection 09/25/15   Blood transfusion    CHF (congestive heart failure) (HCC)    Chronic kidney disease    Cirrhosis ([place])    DDD (degenerative disc disease), lumbar    Depression    Diabetes mellitus    Esophageal varices ([place]) 09/18/2014   Folate deficiency 03/28/2014   Noted on 03/23/2014.  Folate 1 mg prescribed.   Gallstones    GERD (gastroesophageal reflux disease)    Headache    migraines   Helicobacter pylori ab+    July 2010, s/p treatment   Hepatitis B antibody positive    Hepatitis C    and B positive antibody   History of alcoholism ([place])    History of kidney stones    Hypertension    Left leg pain    Liver cirrhosis (HCC)    Low back pain    Other pancytopenia ([place]) 10/27/2013   Ulcer    ?    Family History  Problem Relation Age of Onset   Diabetes Mother    Heart disease Mother    Cancer Paternal Uncle        passed away age 17   Other Father        died in MVA in 47   Heart disease Sister    Anesthesia problems Neg Hx    Hypotension Neg Hx    Malignant hyperthermia Neg Hx    Pseudochol deficiency Neg Hx    Colon cancer Neg Hx    Liver disease Neg Hx     Past Surgical History:  Procedure Laterality Date   ANTERIOR CERVICAL DECOMP/DISCECTOMY FUSION N/A 09/28/2015   Procedure: Cervical three-four Anterior cervical decompression/diskectomy/fusion;  Surgeon: Leeroy Cha, MD;  Location: [facility];  Service: Neurosurgery;  Laterality: N/A;  C3-4 Anterior cervical decompression/diskectomy/fusion   APPENDECTOMY     age 35   BIOPSY  12/18/2011       BIOPSY  05/19/2019   Procedure: BIOPSY;  Surgeon:  Daneil Dolin, MD;  Location: [facility];  Service: Endoscopy;;  gastric   [place]  greater than 10 yrs   [place]   COLONOSCOPY  12/18/11   minimal anal canal hemorrhoids, friable rectal and colonic mucosa, left-sided diverticulosis, repeat in 2022.    ESOPHAGOGASTRODUODENOSCOPY  12/18/11   3 columns of Grade II esophageal varices, reflux esohpagitis, portal gastropathy, path with chronic  gastritis, negative H.pylori, surveillance in June 2014   ESOPHAGOGASTRODUODENOSCOPY (EGD) WITH PROPOFOL N/A 04/13/2014   Dr. [name]: grade 1-2 varices, hiatal hernia, portal gastropathy. NO need for further surveillance while on prophylaxis unless evidence of bleeding   ESOPHAGOGASTRODUODENOSCOPY (EGD) WITH PROPOFOL N/A 05/19/2019   erosive reflux esophagitis, Grade 1-2 esophageal varices with patulous EG junction. Hiatal hernia. Portal gastropathy. Gastric and duodenal dulcer disease.    MULTIPLE EXTRACTIONS WITH ALVEOLOPLASTY  03/08/2012   Procedure: MULTIPLE EXTRACION WITH ALVEOLOPLASTY;  Surgeon: Gae Bon, DDS;  Location: [facility];  Service: Oral Surgery;  Laterality: N/A;   Social History   Occupational History   Occupation: disabled    Fish farm manager: NOT EMPLOYED  Tobacco Use   Smoking status: Current Every Day Smoker    Packs/day: 0.50    Years: 20.00    Pack years: 10.00    Types: Cigarettes   Smokeless tobacco: Never Used  Scientific laboratory technician Use: Never used  Substance and Sexual Activity   Alcohol use: No    Comment: last use 2015   Drug use: No   Sexual activity: Yes    Birth control/protection: Post-menopausal

## 2020-09-17 ENCOUNTER — Telehealth: Payer: Self-pay

## 2020-09-17 NOTE — Telephone Encounter (Signed)
Patient called she is still waiting to recieve a call about her left hip authorization She would like a call back ASAP call back:[phone].

## 2020-09-20 ENCOUNTER — Other Ambulatory Visit: Payer: Self-pay | Admitting: Physician Assistant

## 2020-09-21 NOTE — Telephone Encounter (Signed)
Called patient back. No answer. Could not leave VM. Not sure what she means by left hip auth. I do not see any referrals for inj.

## 2020-10-29 ENCOUNTER — Ambulatory Visit: Payer: Medicare HMO | Admitting: Cardiology

## 2020-10-29 NOTE — Progress Notes (Deleted)
Clinical Summary Gabriella Leon is a 64 y.o.female  1. Preoperative evaluation -being considered for hip replacement for avascular necrosis  2. Chest pain - occasional chest pains - pressure midchest, typically 7/10 in severity. +SOB. Lasts about 30 minutes. Can occur at rest or with exertion. Not associated with meals. Ongoing for 1 year, some decreasing severity - 11/2019 CT chest LAD calcifications - with housework can have some SOB, has to sit down which is new for her - exertion limited by hip pain.  CAD risk factors: DM2, HTN, +tobacco at least 3 years, mother with CAD in her 34s  04/2020 nuclear stress: no ischemia  2. Cirrhosis - followed by GI - history of Hep C s/p treatment. Notes also mention history of EtOH abuse - GI notes mention grade 1-2 varices  3. PUD   4. Pancytopenia - followed by heme Past Medical History:  Diagnosis Date  . Acid reflux disease with ulcer   . Anxiety   . Arthritis   . Asthma   . Blood dyscrasia    thrombocytopenia-next tx injection 09/25/15  . Blood transfusion   . CHF (congestive heart failure) ([place])   . Chronic kidney disease   . Cirrhosis ([place])   . DDD (degenerative disc disease), lumbar   . Depression   . Diabetes mellitus   . Esophageal varices ([place]) 09/18/2014  . Folate deficiency 03/28/2014   Noted on 03/23/2014.  Folate 1 mg prescribed.  . Gallstones   . GERD (gastroesophageal reflux disease)   . Headache    migraines  . Helicobacter pylori ab+    July 2010, s/p treatment  . Hepatitis B antibody positive   . Hepatitis C    and B positive antibody  . History of alcoholism (Gabriella Leon)   . History of kidney stones   . Hypertension   . Left leg pain   . Liver cirrhosis ([place])   . Low back pain   . Other pancytopenia (Gabriella Leon) 10/27/2013  . Ulcer    ?     No Known Allergies   Current Outpatient Medications  Medication Sig Dispense Refill  . aspirin EC 81 MG tablet Take 1 tablet (81 mg total) by mouth 2  (two) times daily. 84 tablet 0  . docusate sodium (COLACE) 100 MG capsule Take 1 capsule (100 mg total) by mouth daily as needed. 30 capsule 2  . DULoxetine (CYMBALTA) 30 MG capsule Take 30 mg by mouth daily.    . famotidine (PEPCID) 20 MG tablet Take 20 mg by mouth 2 (two) times daily.     [name] lisinopril-hydrochlorothiazide (PRINZIDE,ZESTORETIC) 10-12.5 MG per tablet Take 1 tablet by mouth daily.    . meloxicam (MOBIC) 15 MG tablet Take 15 mg by mouth daily.    . metFORMIN (GLUCOPHAGE) 500 MG tablet Take 500 mg by mouth daily with breakfast.    . methocarbamol (ROBAXIN) 500 MG tablet Take 1 tablet (500 mg total) by mouth 2 (two) times daily as needed. 20 tablet 0  . nadolol (CORGARD) 40 MG tablet Take 40 mg by mouth daily.     . naproxen sodium (ALEVE) 220 MG tablet Take 440 mg by mouth daily as needed (for pain).     . ondansetron (ZOFRAN) 4 MG tablet Take 1 tablet (4 mg total) by mouth every 8 (eight) hours as needed for nausea or vomiting. 40 tablet 0  . oxyCODONE (ROXICODONE) 5 MG immediate release tablet Take 1 tablet (5 mg total) by mouth every 6 (six)  hours as needed for severe pain. 6 tablet 0  . oxyCODONE-acetaminophen (PERCOCET) 5-325 MG tablet Take 1-2 tablets by mouth every 6 (six) hours as needed for severe pain. 40 tablet 0  . pantoprazole (PROTONIX) 40 MG tablet Take 40 mg by mouth 2 (two) times daily.    [name] spironolactone (ALDACTONE) 25 MG tablet TAKE 1 TABLET BY MOUTH DAILY (Patient taking differently: Take 25 mg by mouth daily. ) 90 tablet 1  . sulfamethoxazole-trimethoprim (BACTRIM DS) 800-160 MG tablet Take 1 tablet by mouth 2 (two) times daily. 20 tablet 0   No current facility-administered medications for this visit.     Past Surgical History:  Procedure Laterality Date  . ANTERIOR CERVICAL DECOMP/DISCECTOMY FUSION N/A 09/28/2015   Procedure: Cervical three-four Anterior cervical decompression/diskectomy/fusion;  Surgeon: Leeroy Cha, MD;  Location: [facility];   Service: Neurosurgery;  Laterality: N/A;  C3-4 Anterior cervical decompression/diskectomy/fusion  . APPENDECTOMY     age 61  . BIOPSY  12/18/2011      . BIOPSY  05/19/2019   Procedure: BIOPSY;  Surgeon: Daneil Dolin, MD;  [facility];  Service: Endoscopy;;  gastric  . [place]  greater than 10 yrs   [place]  . COLONOSCOPY  12/18/11   minimal anal canal hemorrhoids, friable rectal and colonic mucosa, left-sided diverticulosis, repeat in 2022.   . ESOPHAGOGASTRODUODENOSCOPY  12/18/11   3 columns of Grade II esophageal varices, reflux esohpagitis, portal gastropathy, path with chronic gastritis, negative H.pylori, surveillance in June 2014  . ESOPHAGOGASTRODUODENOSCOPY (EGD) WITH PROPOFOL N/A 04/13/2014   Dr. [name]: grade 1-2 varices, hiatal hernia, portal gastropathy. NO need for further surveillance while on prophylaxis unless evidence of bleeding  . ESOPHAGOGASTRODUODENOSCOPY (EGD) WITH PROPOFOL N/A 05/19/2019   erosive reflux esophagitis, Grade 1-2 esophageal varices with patulous EG junction. Hiatal hernia. Portal gastropathy. Gastric and duodenal dulcer disease.   . MULTIPLE EXTRACTIONS WITH ALVEOLOPLASTY  03/08/2012   Procedure: MULTIPLE EXTRACION WITH ALVEOLOPLASTY;  Surgeon: Gae Bon, DDS;  Location: [facility];  Service: Oral Surgery;  Laterality: N/A;     No Known Allergies    Family History  Problem Relation Age of Onset  . Diabetes Mother   . Heart disease Mother   . Cancer Paternal Uncle        passed away age 2  . Other Father        died in [place] in 22  . Heart disease Sister   . Anesthesia problems Neg Hx   . Hypotension Neg Hx   . Malignant hyperthermia Neg Hx   . Pseudochol deficiency Neg Hx   . Colon cancer Neg Hx   . Liver disease Neg Hx      Social History Gabriella Leon reports that she has been smoking cigarettes. She has a 10.00 pack-year smoking history. She has never used smokeless  tobacco. [name] reports no history of alcohol use.   Review of Systems CONSTITUTIONAL: No weight loss, fever, chills, weakness or fatigue.  HEENT: Eyes: No visual loss, blurred vision, double vision or yellow sclerae.No hearing loss, sneezing, congestion, runny nose or sore throat.  SKIN: No rash or itching.  CARDIOVASCULAR:  RESPIRATORY: No shortness of breath, cough or sputum.  GASTROINTESTINAL: No anorexia, nausea, vomiting or diarrhea. No abdominal pain or blood.  GENITOURINARY: No burning on urination, no polyuria NEUROLOGICAL: No headache, dizziness, syncope, paralysis, ataxia, numbness or tingling in the extremities. No change in bowel or bladder  control.  MUSCULOSKELETAL: No muscle, back pain, joint pain or stiffness.  LYMPHATICS: No enlarged nodes. No history of splenectomy.  PSYCHIATRIC: No history of depression or anxiety.  ENDOCRINOLOGIC: No reports of sweating, cold or heat intolerance. No polyuria or polydipsia.  [name]   Physical Examination There were no vitals filed for this visit. There were no vitals filed for this visit.  Gen: resting comfortably, no acute distress HEENT: no scleral icterus, pupils equal round and reactive, no palptable cervical adenopathy,  CV Resp: Clear to auscultation bilaterally GI: abdomen is soft, non-tender, non-distended, normal bowel sounds, no hepatosplenomegaly MSK: extremities are warm, no edema.  Skin: warm, no rash Neuro:  no focal deficits Psych: appropriate affect   Diagnostic Studies  04/2020 nuclear stress  No diagnostic ST segment changes to indicate ischemia. Frequent PACs noted as well as ectopic atrial rhythm.  No significant myocardial perfusion defects to indicate focal scar or ischemia. Borderline increased TID ratio of 1.27, cannot completely exclude balanced subendocardial ischemia.  This is a low risk study based on the absence of significant, reversible focal perfusion defects, borderline increased  TID ratio not withstanding.  Nuclear stress EF: 72%.   Assessment and Plan  1. Chest pain - unclear etiology, she does have a history of PUD which could be contributing - multiple CAD risk factors as well as LAD calcfications noted on prior chest CT - DOE with mild activities - with symptoms and potential surgery requires further evaluation. Will plan for lexiscan  2. Preoperative evaluation - recent chest pain as reported above. Does not tolerate greater than 4 METs, part of limitations is her chronic hip pain - planning for stress testing as reported above for better risk stratification      Arnoldo Lenis, M.D., F.A.C.C.

## 2020-10-30 DIAGNOSIS — I10 Essential (primary) hypertension: Secondary | ICD-10-CM | POA: Diagnosis not present

## 2020-10-30 DIAGNOSIS — M25552 Pain in left hip: Secondary | ICD-10-CM | POA: Diagnosis not present

## 2020-10-30 DIAGNOSIS — Z6826 Body mass index (BMI) 26.0-26.9, adult: Secondary | ICD-10-CM | POA: Diagnosis not present

## 2020-10-30 DIAGNOSIS — E119 Type 2 diabetes mellitus without complications: Secondary | ICD-10-CM | POA: Diagnosis not present

## 2020-10-30 DIAGNOSIS — F1010 Alcohol abuse, uncomplicated: Secondary | ICD-10-CM | POA: Diagnosis not present

## 2020-11-02 ENCOUNTER — Ambulatory Visit: Payer: Medicare HMO | Admitting: Cardiology

## 2020-11-02 NOTE — Progress Notes (Deleted)
Clinical Summary Gabriella Leon is a 64 y.o.female  1. Preoperative evaluation -being considered for hip replacement for avascular necrosis  2. Chest pain - occasional chest pains - pressure midchest, typically 7/10 in severity. +SOB. Lasts about 30 minutes. Can occur at rest or with exertion. Not associated with meals. Ongoing for 1 year, some decreasing severity - 11/2019 CT chest LAD calcifications - with housework can have some SOB, has to sit down which is new for her - exertion limited by hip pain.  CAD risk factors: DM2, HTN, +tobacco at least 13 years, mother with CAD in her 43s    04/2020 nuclear stress: no ischemia, LVEF 72%   2. Cirrhosis - followed by GI - history of Hep C s/p treatment. Notes also mention history of EtOH abuse - GI notes mention grade 1-2 varices  3. PUD   4. Pancytopenia - followed by heme Past Medical History:  Diagnosis Date   Acid reflux disease with ulcer    Anxiety    Arthritis    Asthma    Blood dyscrasia    thrombocytopenia-next tx injection 09/25/15   Blood transfusion    CHF (congestive heart failure) (HCC)    Chronic kidney disease    Cirrhosis (HCC)    DDD (degenerative disc disease), lumbar    Depression    Diabetes mellitus    Esophageal varices ([place]) 09/18/2014   Folate deficiency 03/28/2014   Noted on 03/23/2014.  Folate 1 mg prescribed.   Gallstones    GERD (gastroesophageal reflux disease)    Headache    migraines   Helicobacter pylori ab+    July 2010, s/p treatment   Hepatitis B antibody positive    Hepatitis C    and B positive antibody   History of alcoholism ([place])    History of kidney stones    Hypertension    Left leg pain    Liver cirrhosis (HCC)    Low back pain    Other pancytopenia ([place]) 10/27/2013   Ulcer    ?     No Known Allergies   Current Outpatient Medications  Medication Sig Dispense Refill   aspirin EC 81 MG tablet Take 1 tablet (81 mg  total) by mouth 2 (two) times daily. 84 tablet 0   docusate sodium (COLACE) 100 MG capsule Take 1 capsule (100 mg total) by mouth daily as needed. 30 capsule 2   DULoxetine (CYMBALTA) 30 MG capsule Take 30 mg by mouth daily.     famotidine (PEPCID) 20 MG tablet Take 20 mg by mouth 2 (two) times daily.      lisinopril-hydrochlorothiazide (PRINZIDE,ZESTORETIC) 10-12.5 MG per tablet Take 1 tablet by mouth daily.     meloxicam (MOBIC) 15 MG tablet Take 15 mg by mouth daily.     metFORMIN (GLUCOPHAGE) 500 MG tablet Take 500 mg by mouth daily with breakfast.     methocarbamol (ROBAXIN) 500 MG tablet Take 1 tablet (500 mg total) by mouth 2 (two) times daily as needed. 20 tablet 0   nadolol (CORGARD) 40 MG tablet Take 40 mg by mouth daily.      naproxen sodium (ALEVE) 220 MG tablet Take 440 mg by mouth daily as needed (for pain).      ondansetron (ZOFRAN) 4 MG tablet Take 1 tablet (4 mg total) by mouth every 8 (eight) hours as needed for nausea or vomiting. 40 tablet 0   oxyCODONE (ROXICODONE) 5 MG immediate release tablet Take 1 tablet (5 mg total)  by mouth every 6 (six) hours as needed for severe pain. 6 tablet 0   oxyCODONE-acetaminophen (PERCOCET) 5-325 MG tablet Take 1-2 tablets by mouth every 6 (six) hours as needed for severe pain. 40 tablet 0   pantoprazole (PROTONIX) 40 MG tablet Take 40 mg by mouth 2 (two) times daily.     spironolactone (ALDACTONE) 25 MG tablet TAKE 1 TABLET BY MOUTH DAILY (Patient taking differently: Take 25 mg by mouth daily. ) 90 tablet 1   sulfamethoxazole-trimethoprim (BACTRIM DS) 800-160 MG tablet Take 1 tablet by mouth 2 (two) times daily. 20 tablet 0   No current facility-administered medications for this visit.     Past Surgical History:  Procedure Laterality Date   ANTERIOR CERVICAL DECOMP/DISCECTOMY FUSION N/A 09/28/2015   Procedure: Cervical three-four Anterior cervical decompression/diskectomy/fusion;  Surgeon: Gabriella Leon;  Location: [facility];  Service: Neurosurgery;  Laterality: N/A;  C3-4 Anterior cervical decompression/diskectomy/fusion   APPENDECTOMY     age 33   BIOPSY  12/18/2011       BIOPSY  05/19/2019   Procedure: BIOPSY;  Surgeon: Gabriella Dolin, Leon;  Location: [facility];  Service: Endoscopy;;  gastric   [place]  greater than 10 yrs   [place]   COLONOSCOPY  12/18/11   minimal anal canal hemorrhoids, friable rectal and colonic mucosa, left-sided diverticulosis, repeat in 2022.    ESOPHAGOGASTRODUODENOSCOPY  12/18/11   3 columns of Grade II esophageal varices, reflux esohpagitis, portal gastropathy, path with chronic gastritis, negative H.pylori, surveillance in June 2014   ESOPHAGOGASTRODUODENOSCOPY (EGD) WITH PROPOFOL N/A 04/13/2014   Dr. Gala Leon: grade 1-2 varices, hiatal hernia, portal gastropathy. NO need for further surveillance while on prophylaxis unless evidence of bleeding   ESOPHAGOGASTRODUODENOSCOPY (EGD) WITH PROPOFOL N/A 05/19/2019   erosive reflux esophagitis, Grade 1-2 esophageal varices with patulous EG junction. Hiatal hernia. Portal gastropathy. Gastric and duodenal dulcer disease.    MULTIPLE EXTRACTIONS WITH ALVEOLOPLASTY  03/08/2012   Procedure: MULTIPLE EXTRACION WITH ALVEOLOPLASTY;  Surgeon: Gabriella Leon, Gabriella Leon;  Location: [facility];  Service: Oral Surgery;  Laterality: N/A;     No Known Allergies    Family History  Problem Relation Age of Onset   Diabetes Mother    Heart disease Mother    Cancer Paternal Uncle        passed away age 72   Other Father        died in MVA in 10   Heart disease Sister    Anesthesia problems Neg Hx    Hypotension Neg Hx    Malignant hyperthermia Neg Hx    Pseudochol deficiency Neg Hx    Colon cancer Neg Hx    Liver disease Neg Hx      Social History Gabriella Leon reports that she has been smoking cigarettes. She has a 10.00 pack-year smoking history. She has never used  smokeless tobacco. Gabriella Leon reports no history of alcohol use.   Review of Systems CONSTITUTIONAL: No weight loss, fever, chills, weakness or fatigue.  HEENT: Eyes: No visual loss, blurred vision, double vision or yellow sclerae.No hearing loss, sneezing, congestion, runny nose or sore throat.  SKIN: No rash or itching.  CARDIOVASCULAR:  RESPIRATORY: No shortness of breath, cough or sputum.  GASTROINTESTINAL: No anorexia, nausea, vomiting or diarrhea. No abdominal pain or blood.  GENITOURINARY: No burning on urination, no polyuria NEUROLOGICAL: No headache, dizziness, syncope, paralysis, ataxia, numbness or tingling in the extremities. No  change in bowel or bladder control.  MUSCULOSKELETAL: No muscle, back pain, joint pain or stiffness.  LYMPHATICS: No enlarged nodes. No history of splenectomy.  PSYCHIATRIC: No history of depression or anxiety.  ENDOCRINOLOGIC: No reports of sweating, cold or heat intolerance. No polyuria or polydipsia.  [name]   Physical Examination There were no vitals filed for this visit. There were no vitals filed for this visit.  Gen: resting comfortably, no acute distress HEENT: no scleral icterus, pupils equal round and reactive, no palptable cervical adenopathy,  CV Resp: Clear to auscultation bilaterally GI: abdomen is soft, non-tender, non-distended, normal bowel sounds, no hepatosplenomegaly MSK: extremities are warm, no edema.  Skin: warm, no rash Neuro:  no focal deficits Psych: appropriate affect   Diagnostic Studies 04/2020 nuclear stress  No diagnostic ST segment changes to indicate ischemia. Frequent PACs noted as well as ectopic atrial rhythm.  No significant myocardial perfusion defects to indicate focal scar or ischemia. Borderline increased TID ratio of 1.27, cannot completely exclude balanced subendocardial ischemia.  This is a low risk study based on the absence of significant, reversible focal perfusion defects, borderline  increased TID ratio not withstanding.  Nuclear stress EF: 72%.    Assessment and Plan  1. Chest pain - unclear etiology, she does have a history of PUD which could be contributing - multiple CAD risk factors as well as LAD calcfications noted on prior chest CT - DOE with mild activities - with symptoms and potential surgery requires further evaluation. Will plan for lexiscan  2. Preoperative evaluation - recent chest pain as reported above. Does not tolerate greater than 4 METs, part of limitations is her chronic hip pain - planning for stress testing as reported above for better risk stratification      Arnoldo Lenis, M.D., F.A.C.C.

## 2020-11-05 ENCOUNTER — Encounter: Payer: Self-pay | Admitting: Cardiology

## 2020-11-12 ENCOUNTER — Encounter (HOSPITAL_COMMUNITY): Payer: Self-pay

## 2020-11-12 NOTE — Progress Notes (Signed)
Attempted to reach patient regarding follow-up LDCT. Unable to reach patient at this time, no VM box available.

## 2020-11-29 ENCOUNTER — Ambulatory Visit: Payer: Medicare HMO | Admitting: Orthopaedic Surgery

## 2020-11-29 ENCOUNTER — Other Ambulatory Visit: Payer: Self-pay

## 2020-11-29 ENCOUNTER — Other Ambulatory Visit (HOSPITAL_COMMUNITY): Payer: Self-pay

## 2020-11-29 DIAGNOSIS — Z87891 Personal history of nicotine dependence: Secondary | ICD-10-CM

## 2020-11-29 DIAGNOSIS — Z122 Encounter for screening for malignant neoplasm of respiratory organs: Secondary | ICD-10-CM

## 2020-11-29 NOTE — Progress Notes (Signed)
Patient scheduled for follow-up LDCT scan on 12/26/2020 at 1145.

## 2020-12-20 ENCOUNTER — Ambulatory Visit: Payer: Medicare HMO | Admitting: Orthopaedic Surgery

## 2020-12-20 ENCOUNTER — Other Ambulatory Visit: Payer: Self-pay

## 2020-12-20 DIAGNOSIS — K219 Gastro-esophageal reflux disease without esophagitis: Secondary | ICD-10-CM | POA: Diagnosis not present

## 2020-12-20 DIAGNOSIS — R1110 Vomiting, unspecified: Secondary | ICD-10-CM | POA: Diagnosis not present

## 2020-12-20 DIAGNOSIS — E119 Type 2 diabetes mellitus without complications: Secondary | ICD-10-CM | POA: Diagnosis not present

## 2020-12-20 DIAGNOSIS — I110 Hypertensive heart disease with heart failure: Secondary | ICD-10-CM | POA: Diagnosis not present

## 2020-12-20 DIAGNOSIS — R109 Unspecified abdominal pain: Secondary | ICD-10-CM | POA: Diagnosis not present

## 2020-12-20 DIAGNOSIS — I700 Atherosclerosis of aorta: Secondary | ICD-10-CM | POA: Diagnosis not present

## 2020-12-20 DIAGNOSIS — K7460 Unspecified cirrhosis of liver: Secondary | ICD-10-CM | POA: Diagnosis not present

## 2020-12-20 DIAGNOSIS — R1013 Epigastric pain: Secondary | ICD-10-CM | POA: Diagnosis not present

## 2020-12-20 DIAGNOSIS — K766 Portal hypertension: Secondary | ICD-10-CM | POA: Diagnosis not present

## 2020-12-20 DIAGNOSIS — I509 Heart failure, unspecified: Secondary | ICD-10-CM | POA: Diagnosis not present

## 2020-12-20 DIAGNOSIS — R112 Nausea with vomiting, unspecified: Secondary | ICD-10-CM | POA: Diagnosis not present

## 2020-12-21 DIAGNOSIS — R1110 Vomiting, unspecified: Secondary | ICD-10-CM | POA: Diagnosis not present

## 2020-12-21 DIAGNOSIS — R109 Unspecified abdominal pain: Secondary | ICD-10-CM | POA: Diagnosis not present

## 2020-12-22 IMAGING — CT CT ABD-PELV W/ CM
2 of 5 series · 15 of 46 positions shown, 17 images · IV contrast (Omnipaque or Isovue)
Comparison: July 06, 2019

CLINICAL DATA: Abdominal pain.

EXAM:
CT ABDOMEN AND PELVIS WITH CONTRAST
TECHNIQUE: Multidetector CT imaging of the abdomen and pelvis was performed
using the standard protocol following bolus administration of
intravenous contrast.
CONTRAST:  100mL OMNIPAQUE IOHEXOL 300 MG/ML  SOLN

[Series 2: axial st · axial · 0.64mm/px · z∈[+641,+1046]mm · 12 of 95 slices shown, 14 images]
[im 7/95  soft-tissue]
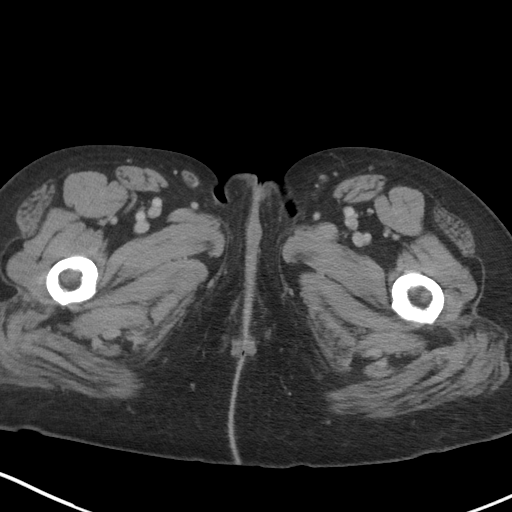
[im 7/95  bone]
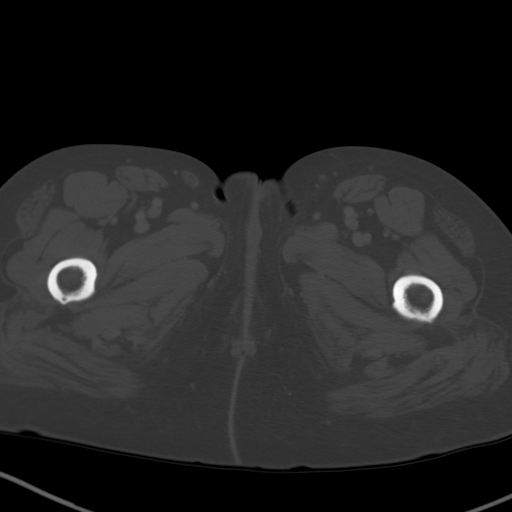
[im 13/95  soft-tissue]
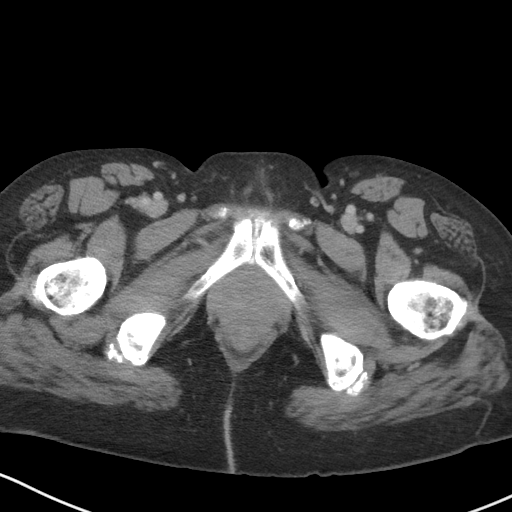
[im 19/95  soft-tissue]
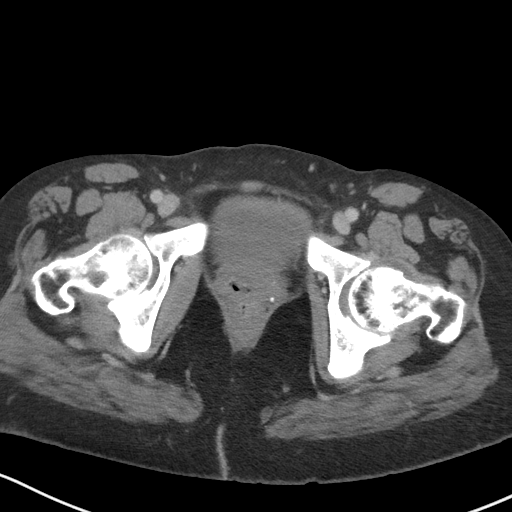
[im 32/95  soft-tissue]
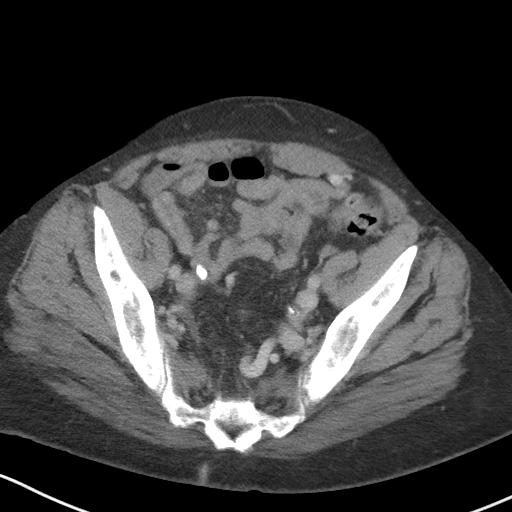
[im 38/95  soft-tissue]
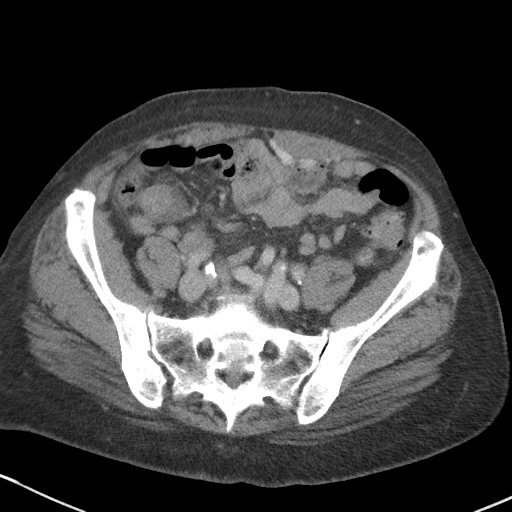
[im 44/95  soft-tissue]
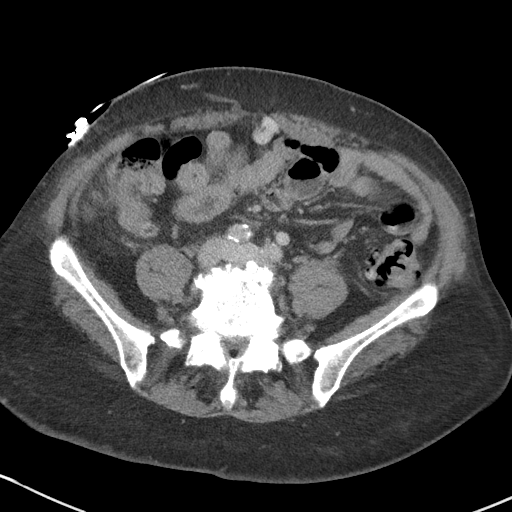
[im 51/95  soft-tissue]
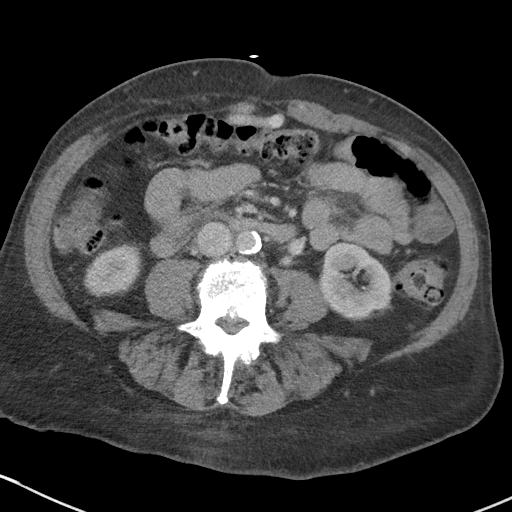
[im 57/95  soft-tissue]
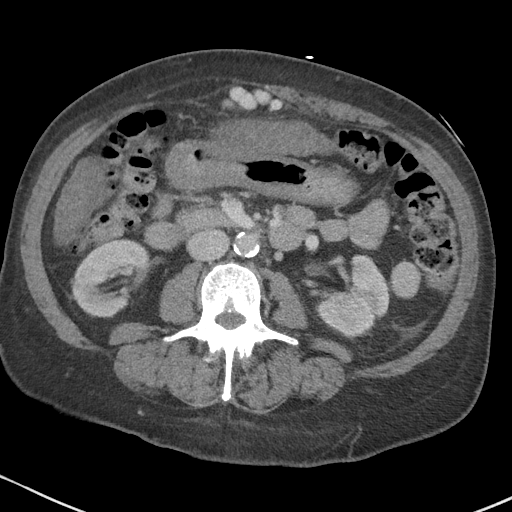
[im 63/95  soft-tissue]
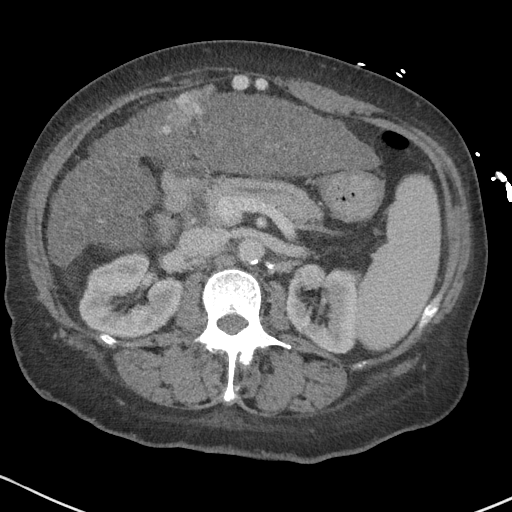
[im 63/95  bone]
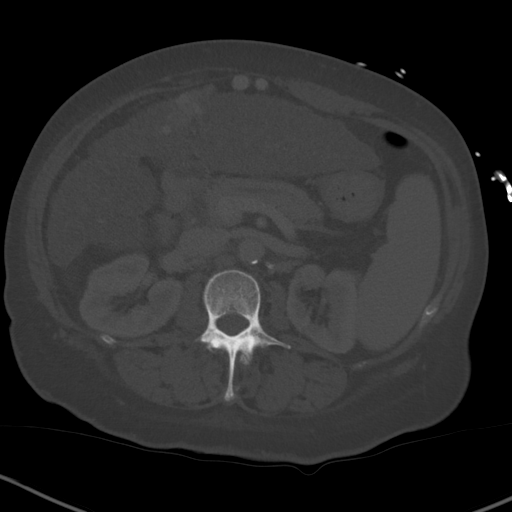
[im 76/95  soft-tissue]
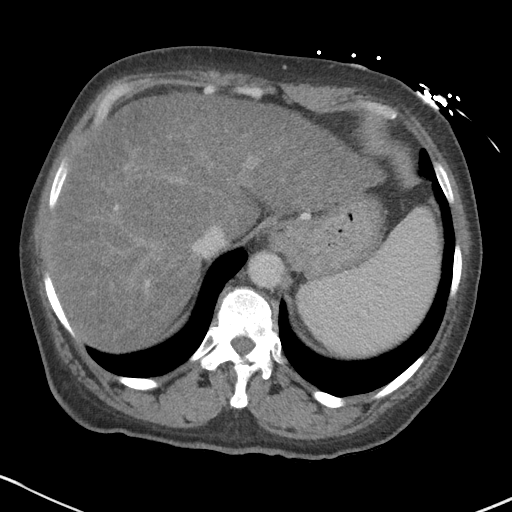
[im 82/95  soft-tissue]
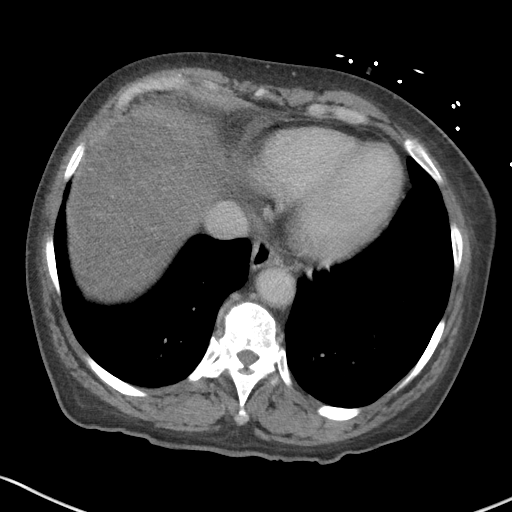
[im 88/95  soft-tissue]
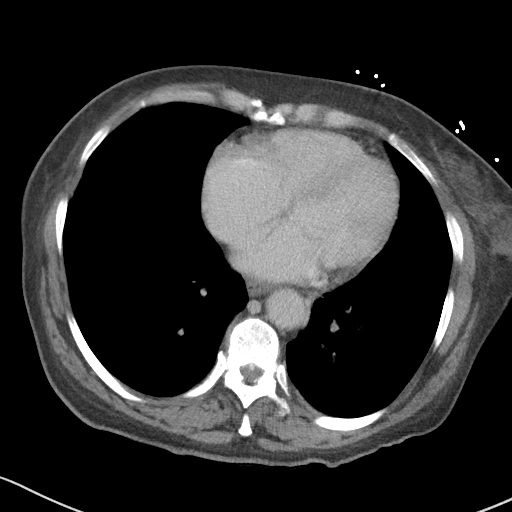

[Series 5: coronal st · coronal · 0.65mm/px · 3 of 93 slices shown]
[im 31/93  soft-tissue]
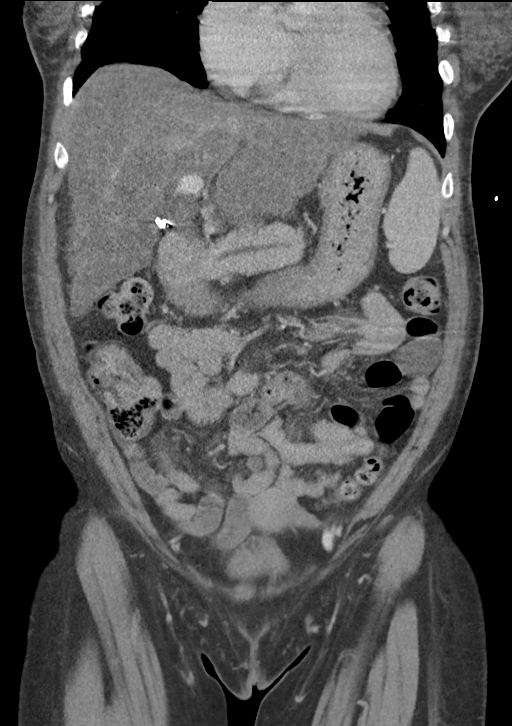
[im 41/93  soft-tissue]
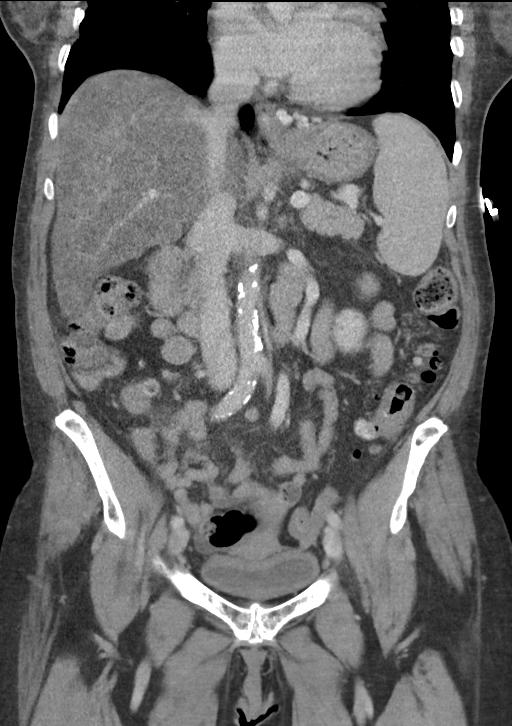
[im 52/93  soft-tissue]
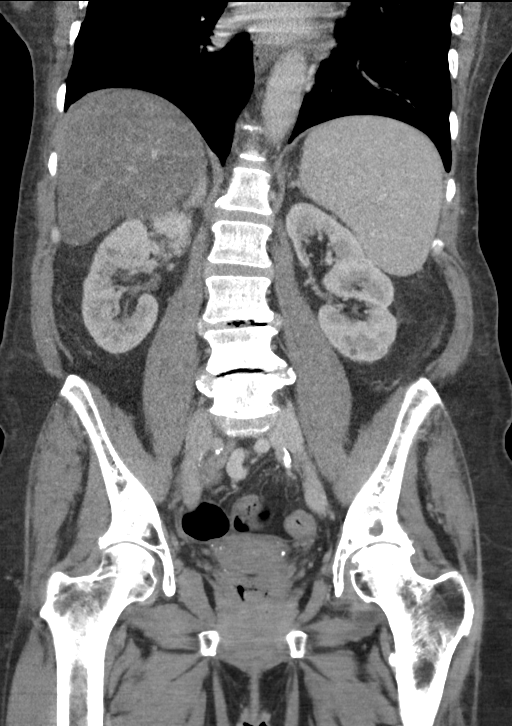

[15 of 46 positions shown; findings below may reference images not displayed]

FINDINGS: Lower chest: No acute abnormality.

Hepatobiliary: The liver is cirrhotic in appearance. No focal liver
abnormality is seen. Diffuse fatty infiltration of the liver
parenchyma is noted. Status post cholecystectomy.

Pancreas: Unremarkable. No pancreatic ductal dilatation or
surrounding inflammatory changes.

Spleen: There is moderate severity splenomegaly. A 2.1 cm x 2.1 cm
accessory spleen is seen adjacent to the lateral aspect of the left
kidney.

Adrenals/Urinary Tract: Adrenal glands are unremarkable. Kidneys are
normal in size, without renal calculi or hydronephrosis. 6 mm cysts
are seen within the anterior and posterior aspect of the mid left
kidney. A 7 mm cyst is seen along the posterolateral aspect of the
mid right kidney. Bladder is unremarkable.

Stomach/Bowel: Stomach is within normal limits. Appendix appears
normal. No evidence of bowel dilatation. Noninflamed diverticula are
seen throughout the large bowel. A mildly inflamed diverticulum is
seen along the terminal ileum (axial CT images 55 through 61, CT
series number 2/coronal reformatted image 34 through 40, CT series
number 5).

Vascular/Lymphatic: Marked severity aortic atherosclerosis. Dilated
tortuous varices are seen within the anterior aspect of the abdomen,
along the midline. No enlarged abdominal or pelvic lymph nodes.

Reproductive: A 9 mm x 7 mm hyperdense focus is seen within the
region of the uterine fundus (axial CT image 67, CT series number
2). This is not clearly seen on the prior study.

Other: No abdominal wall hernia or abnormality. No abdominopelvic
ascites.

Musculoskeletal: Multilevel degenerative changes are seen throughout
the lumbar spine. This is most prominent at the levels of L3-L4,
L4-L5 and L5-S1.

Moderate to marked severity degenerative changes are seen involving
the bilateral hips.
IMPRESSION: 1. Mild diverticulitis involving the terminal ileum.
2. Colonic diverticulosis.
3. Cirrhotic, fatty liver with numerous varices.
4. Evidence of prior cholecystectomy.
5. Stable splenomegaly.
6. Small hyperdense focus within the uterus which may represent a
small, noncalcified uterine fibroid.

## 2020-12-25 ENCOUNTER — Other Ambulatory Visit (HOSPITAL_COMMUNITY): Payer: Self-pay | Admitting: *Deleted

## 2020-12-25 DIAGNOSIS — D649 Anemia, unspecified: Secondary | ICD-10-CM

## 2020-12-25 DIAGNOSIS — D696 Thrombocytopenia, unspecified: Secondary | ICD-10-CM

## 2020-12-25 DIAGNOSIS — D500 Iron deficiency anemia secondary to blood loss (chronic): Secondary | ICD-10-CM

## 2020-12-26 ENCOUNTER — Ambulatory Visit (HOSPITAL_COMMUNITY): Payer: Medicare HMO

## 2020-12-26 ENCOUNTER — Inpatient Hospital Stay (HOSPITAL_COMMUNITY): Payer: Medicare HMO | Attending: Hematology

## 2021-01-01 ENCOUNTER — Ambulatory Visit (HOSPITAL_COMMUNITY): Payer: Medicare HMO | Admitting: Oncology

## 2021-01-02 ENCOUNTER — Ambulatory Visit (HOSPITAL_COMMUNITY): Payer: Medicare HMO | Admitting: Nurse Practitioner

## 2021-01-29 DIAGNOSIS — M25552 Pain in left hip: Secondary | ICD-10-CM | POA: Diagnosis not present

## 2021-01-29 DIAGNOSIS — Z6826 Body mass index (BMI) 26.0-26.9, adult: Secondary | ICD-10-CM | POA: Diagnosis not present

## 2021-01-29 DIAGNOSIS — I10 Essential (primary) hypertension: Secondary | ICD-10-CM | POA: Diagnosis not present

## 2021-01-29 DIAGNOSIS — F1010 Alcohol abuse, uncomplicated: Secondary | ICD-10-CM | POA: Diagnosis not present

## 2021-01-29 DIAGNOSIS — E119 Type 2 diabetes mellitus without complications: Secondary | ICD-10-CM | POA: Diagnosis not present

## 2021-02-13 DIAGNOSIS — M1612 Unilateral primary osteoarthritis, left hip: Secondary | ICD-10-CM | POA: Diagnosis not present

## 2021-02-14 ENCOUNTER — Telehealth: Payer: Self-pay | Admitting: Cardiology

## 2021-02-14 ENCOUNTER — Encounter (HOSPITAL_COMMUNITY): Payer: Self-pay

## 2021-02-14 NOTE — Progress Notes (Signed)
Attempted to contact patient to scheduled LDCT. Unable to reach patient. Detailed VM left asking the patient to return my call

## 2021-02-14 NOTE — Telephone Encounter (Signed)
   [facility] Pre-operative Risk Assessment    HEARTCARE STAFF: - Please ensure there is not already an duplicate clearance open for this procedure. - Under Visit Info/Reason for Call, type in Other and utilize the format Clearance MM/DD/YY or Clearance TBD. Do not use dashes or single digits. - If request is for dental extraction, please clarify the # of teeth to be extracted.  Request for surgical clearance:  1. What type of surgery is being performed? Left total hip athroplasty  When is this surgery scheduled? Pending clearance  2. What type of clearance is required (medical clearance vs. Pharmacy clearance to hold med vs. Both)? both  3. Are there any medications that need to be held prior to surgery and how long? Please advise of any medication she should hold   4. Practice name and name of physician performing surgery? Dr [name]  5. What is the office phone number? [phone]   7.   What is the office fax number?[phone]  8.   Anesthesia type (None, local, MAC, general) ? spinal   Jannet Askew 02/14/2021, 3:15 PM  _________________________________________________________________   (provider comments below)

## 2021-02-15 NOTE — Telephone Encounter (Signed)
   Primary Cardiologist: Carlyle Dolly, MD  Chart reviewed as part of pre-operative protocol coverage. Patient was contacted 02/15/2021 in reference to pre-operative risk assessment for pending surgery as outlined below.  Gabriella Leon was last seen on 05/15/2020 by Dr. Harl Bowie who has cleared her for hip surgery following a negative Myoview.  Since that day, Gabriella Leon has done well without any chest pain or worsening shortness of breath.  Therefore, based on ACC/AHA guidelines, the patient would be at acceptable risk for the planned procedure without further cardiovascular testing.   The patient was advised that if she develops new symptoms prior to surgery to contact our office to arrange for a follow-up visit, and she verbalized understanding.  I will route this recommendation to the requesting party via Epic fax function and remove from pre-op pool. Please call with questions.  [place], [place] 02/15/2021, 4:39 PM

## 2021-03-01 ENCOUNTER — Encounter (HOSPITAL_COMMUNITY): Payer: Self-pay

## 2021-03-01 NOTE — Progress Notes (Signed)
I have attempted to reach the patient regarding LDCT follow-up. Unable to reach the patient at this time and VM box is full.

## 2021-03-05 ENCOUNTER — Other Ambulatory Visit (HOSPITAL_COMMUNITY): Payer: Self-pay

## 2021-03-05 DIAGNOSIS — Z122 Encounter for screening for malignant neoplasm of respiratory organs: Secondary | ICD-10-CM

## 2021-03-06 ENCOUNTER — Inpatient Hospital Stay (HOSPITAL_COMMUNITY): Payer: Medicare HMO | Attending: Hematology

## 2021-03-06 ENCOUNTER — Other Ambulatory Visit: Payer: Self-pay

## 2021-03-06 DIAGNOSIS — D649 Anemia, unspecified: Secondary | ICD-10-CM

## 2021-03-06 DIAGNOSIS — D696 Thrombocytopenia, unspecified: Secondary | ICD-10-CM

## 2021-03-06 DIAGNOSIS — D500 Iron deficiency anemia secondary to blood loss (chronic): Secondary | ICD-10-CM | POA: Insufficient documentation

## 2021-03-06 DIAGNOSIS — E119 Type 2 diabetes mellitus without complications: Secondary | ICD-10-CM | POA: Insufficient documentation

## 2021-03-06 DIAGNOSIS — Z122 Encounter for screening for malignant neoplasm of respiratory organs: Secondary | ICD-10-CM

## 2021-03-06 LAB — CBC WITH DIFFERENTIAL/PLATELET
Abs Immature Granulocytes: 0.01 10*3/uL (ref 0.00–0.07)
Basophils Absolute: 0 10*3/uL (ref 0.0–0.1)
Basophils Relative: 1 %
Eosinophils Absolute: 0.2 10*3/uL (ref 0.0–0.5)
Eosinophils Relative: 7 %
HCT: 34.4 % — ABNORMAL LOW (ref 36.0–46.0)
Hemoglobin: 11.2 g/dL — ABNORMAL LOW (ref 12.0–15.0)
Immature Granulocytes: 0 %
Lymphocytes Relative: 26 %
Lymphs Abs: 0.9 10*3/uL (ref 0.7–4.0)
MCH: 34 pg (ref 26.0–34.0)
MCHC: 32.6 g/dL (ref 30.0–36.0)
MCV: 104.6 fL — ABNORMAL HIGH (ref 80.0–100.0)
Monocytes Absolute: 0.4 10*3/uL (ref 0.1–1.0)
Monocytes Relative: 10 %
Neutro Abs: 2 10*3/uL (ref 1.7–7.7)
Neutrophils Relative %: 56 %
Platelets: 67 10*3/uL — ABNORMAL LOW (ref 150–400)
RBC: 3.29 MIL/uL — ABNORMAL LOW (ref 3.87–5.11)
RDW: 13.4 % (ref 11.5–15.5)
WBC: 3.7 10*3/uL — ABNORMAL LOW (ref 4.0–10.5)
nRBC: 0 % (ref 0.0–0.2)

## 2021-03-06 LAB — COMPREHENSIVE METABOLIC PANEL
ALT: 23 U/L (ref 0–44)
AST: 44 U/L — ABNORMAL HIGH (ref 15–41)
Albumin: 3.2 g/dL — ABNORMAL LOW (ref 3.5–5.0)
Alkaline Phosphatase: 201 U/L — ABNORMAL HIGH (ref 38–126)
Anion gap: 12 (ref 5–15)
BUN: 22 mg/dL (ref 8–23)
CO2: 23 mmol/L (ref 22–32)
Calcium: 9.4 mg/dL (ref 8.9–10.3)
Chloride: 102 mmol/L (ref 98–111)
Creatinine, Ser: 1.24 mg/dL — ABNORMAL HIGH (ref 0.44–1.00)
GFR, Estimated: 49 mL/min — ABNORMAL LOW (ref >60)
Glucose, Bld: 158 mg/dL — ABNORMAL HIGH (ref 70–99)
Potassium: 3.8 mmol/L (ref 3.5–5.1)
Sodium: 137 mmol/L (ref 135–145)
Total Bilirubin: 1.2 mg/dL (ref 0.3–1.2)
Total Protein: 7.1 g/dL (ref 6.5–8.1)

## 2021-03-06 LAB — VITAMIN B12: Vitamin B-12: 564 pg/mL (ref 180–914)

## 2021-03-06 LAB — HEMOGLOBIN A1C
Hgb A1c MFr Bld: 4.6 % — ABNORMAL LOW (ref 4.8–5.6)
Mean Plasma Glucose: 85.32 mg/dL

## 2021-03-06 LAB — FERRITIN: Ferritin: 372 ng/mL — ABNORMAL HIGH (ref 11–307)

## 2021-03-06 LAB — PROTIME-INR
INR: 1.2 (ref 0.8–1.2)
Prothrombin Time: 14.4 seconds (ref 11.4–15.2)

## 2021-03-06 LAB — IRON AND TIBC
Iron: 270 ug/dL — ABNORMAL HIGH (ref 28–170)
Saturation Ratios: 71 % — ABNORMAL HIGH (ref 10.4–31.8)
TIBC: 380 ug/dL (ref 250–450)
UIBC: 110 ug/dL

## 2021-03-06 LAB — VITAMIN D 25 HYDROXY (VIT D DEFICIENCY, FRACTURES): Vit D, 25-Hydroxy: 9.61 ng/mL — ABNORMAL LOW (ref 30–100)

## 2021-03-06 LAB — LACTATE DEHYDROGENASE: LDH: 142 U/L (ref 98–192)

## 2021-03-07 ENCOUNTER — Inpatient Hospital Stay (HOSPITAL_COMMUNITY): Payer: Medicare HMO | Admitting: Hematology

## 2021-03-26 ENCOUNTER — Inpatient Hospital Stay (HOSPITAL_COMMUNITY): Payer: Medicare HMO | Attending: Hematology | Admitting: Hematology

## 2021-03-26 VITALS — BP 166/80 | HR 85 | Temp 97.1°F | Resp 16 | Wt 164.8 lb

## 2021-03-26 DIAGNOSIS — M1712 Unilateral primary osteoarthritis, left knee: Secondary | ICD-10-CM | POA: Insufficient documentation

## 2021-03-26 DIAGNOSIS — D61818 Other pancytopenia: Secondary | ICD-10-CM | POA: Diagnosis not present

## 2021-03-26 DIAGNOSIS — M1612 Unilateral primary osteoarthritis, left hip: Secondary | ICD-10-CM | POA: Insufficient documentation

## 2021-03-26 DIAGNOSIS — D693 Immune thrombocytopenic purpura: Secondary | ICD-10-CM | POA: Diagnosis not present

## 2021-03-26 DIAGNOSIS — F17210 Nicotine dependence, cigarettes, uncomplicated: Secondary | ICD-10-CM | POA: Insufficient documentation

## 2021-03-26 DIAGNOSIS — E559 Vitamin D deficiency, unspecified: Secondary | ICD-10-CM | POA: Insufficient documentation

## 2021-03-26 DIAGNOSIS — R7989 Other specified abnormal findings of blood chemistry: Secondary | ICD-10-CM | POA: Diagnosis not present

## 2021-03-26 MED ORDER — ERGOCALCIFEROL 1.25 MG (50000 UT) PO CAPS: 50000.0000 [IU] | ORAL_CAPSULE | ORAL | 2 refills | Status: DC

## 2021-03-26 MED ORDER — DEXAMETHASONE 4 MG PO TABS: 40.0000 mg | ORAL_TABLET | Freq: Every day | ORAL | 0 refills | Status: DC

## 2021-03-26 NOTE — Progress Notes (Signed)
Gabriella Leon, [place] [address]   CLINIC:  Medical Oncology/Hematology  PCP:  Neale Burly, MD [place] / [place] [id number]  [phone]  REASON FOR VISIT:  Follow-up for pancytopenia  PRIOR THERAPY: None  CURRENT THERAPY: Observation  INTERVAL HISTORY:  [name], a 65 y.o. female, returns for routine follow-up for her pancytopenia. [name] was last seen by Francene Finders, NP, on 06/26/2020.  Today she reports feeling okay. She complains of having constant pain in her left hip and knee due to OA and is going to have a hip replacement with Dr. [name]. She complains of not being able to sleep due to the pain. She is taking vitamin D daily.  She is scheduled to have her LDCT on 04/22.   REVIEW OF SYSTEMS:  Review of Systems  Constitutional: Positive for appetite change (50%) and fatigue (75%).  Musculoskeletal: Positive for arthralgias (10/10 L hip pain).  Neurological: Positive for numbness (feet).  Psychiatric/Behavioral: Positive for sleep disturbance (d/t L hip pain).  All other systems reviewed and are negative.   PAST MEDICAL/SURGICAL HISTORY:  Past Medical History:  Diagnosis Date  . Acid reflux disease with ulcer   . Anxiety   . Arthritis   . Asthma   . Blood dyscrasia    thrombocytopenia-next tx injection 09/25/15  . Blood transfusion   . CHF (congestive heart failure) ([place])   . Chronic kidney disease   . Cirrhosis ([place])   . DDD (degenerative disc disease), lumbar   . Depression   . Diabetes mellitus   . Esophageal varices ([place]) 09/18/2014  . Folate deficiency 03/28/2014   Noted on 03/23/2014.  Folate 1 mg prescribed.  . Gallstones   . GERD (gastroesophageal reflux disease)   . Headache    migraines  . Helicobacter pylori ab+    July 2010, s/p treatment  . Hepatitis B antibody positive   . Hepatitis C    and B positive antibody  . History of alcoholism ([place])   . History of kidney  stones   . Hypertension   . Left leg pain   . Liver cirrhosis ([place])   . Low back pain   . Other pancytopenia ([place]) 10/27/2013  . Ulcer    ?   Past Surgical History:  Procedure Laterality Date  . ANTERIOR CERVICAL DECOMP/DISCECTOMY FUSION N/A 09/28/2015   Procedure: Cervical three-four Anterior cervical decompression/diskectomy/fusion;  Surgeon: Leeroy Cha, MD;  Location: [facility];  Service: Neurosurgery;  Laterality: N/A;  C3-4 Anterior cervical decompression/diskectomy/fusion  . APPENDECTOMY     age 73  . BIOPSY  12/18/2011      . BIOPSY  05/19/2019   Procedure: BIOPSY;  Surgeon: Daneil Dolin, MD;  Location: [facility];  Service: Endoscopy;;  gastric  . [place]  greater than 10 yrs   [facility]  . COLONOSCOPY  12/18/11   minimal anal canal hemorrhoids, friable rectal and colonic mucosa, left-sided diverticulosis, repeat in 2022.   . ESOPHAGOGASTRODUODENOSCOPY  12/18/11   3 columns of Grade II esophageal varices, reflux esohpagitis, portal gastropathy, path with chronic gastritis, negative H.pylori, surveillance in June 2014  . ESOPHAGOGASTRODUODENOSCOPY (EGD) WITH PROPOFOL N/A 04/13/2014   Dr. [name]: grade 1-2 varices, hiatal hernia, portal gastropathy. NO need for further surveillance while on prophylaxis unless evidence of bleeding  . ESOPHAGOGASTRODUODENOSCOPY (EGD) WITH PROPOFOL N/A 05/19/2019   erosive reflux esophagitis, Grade  1-2 esophageal varices with patulous EG junction. Hiatal hernia. Portal gastropathy. Gastric and duodenal dulcer disease.   . MULTIPLE EXTRACTIONS WITH ALVEOLOPLASTY  03/08/2012   Procedure: MULTIPLE EXTRACION WITH ALVEOLOPLASTY;  Surgeon: Gae Bon, DDS;  Location: [facility];  Service: Oral Surgery;  Laterality: N/A;    SOCIAL HISTORY:  Social History   Socioeconomic History  . Marital status: Legally Separated    Spouse name: Not on file  . Number of children: 1  . Years of education: 10th grade   . Highest education level: Not on file  Occupational History  . Occupation: disabled    Fish farm manager: NOT EMPLOYED  Tobacco Use  . Smoking status: Current Every Day Smoker    Packs/day: 0.50    Years: 20.00    Pack years: 10.00    Types: Cigarettes  . Smokeless tobacco: Never Used  Vaping Use  . Vaping Use: Never used  Substance and Sexual Activity  . Alcohol use: No    Comment: last use 2015  . Drug use: No  . Sexual activity: Yes    Birth control/protection: Post-menopausal  Other Topics Concern  . Not on file  Social History Narrative   Lives at home alone.   Right-handed.   Occasional caffeine use.   Social Determinants of Health   Financial Resource Strain: Not on file  Food Insecurity: Not on file  Transportation Needs: Not on file  Physical Activity: Not on file  Stress: Not on file  Social Connections: Not on file  Intimate Partner Violence: Not on file    FAMILY HISTORY:  Family History  Problem Relation Age of Onset  . Diabetes Mother   . Heart disease Mother   . Cancer Paternal Uncle        passed away age 58  . Other Father        died in [place] in 78  . Heart disease Sister   . Anesthesia problems Neg Hx   . Hypotension Neg Hx   . Malignant hyperthermia Neg Hx   . Pseudochol deficiency Neg Hx   . Colon cancer Neg Hx   . Liver disease Neg Hx     CURRENT MEDICATIONS:  Current Outpatient Medications  Medication Sig Dispense Refill  . aspirin EC 81 MG tablet Take 1 tablet (81 mg total) by mouth 2 (two) times daily. 84 tablet 0  . docusate sodium (COLACE) 100 MG capsule Take 1 capsule (100 mg total) by mouth daily as needed. 30 capsule 2  . DULoxetine (CYMBALTA) 30 MG capsule Take 30 mg by mouth daily.    . ergocalciferol (VITAMIN D2) 1.25 MG (50000 UT) capsule Take 1 capsule (50,000 Units total) by mouth once a week. 12 capsule 2  . famotidine (PEPCID) 20 MG tablet Take 20 mg by mouth 2 (two) times daily.     [name] lisinopril-hydrochlorothiazide  (PRINZIDE,ZESTORETIC) 10-12.5 MG per tablet Take 1 tablet by mouth daily.    . meloxicam (MOBIC) 15 MG tablet Take 15 mg by mouth daily.    . metFORMIN (GLUCOPHAGE) 500 MG tablet Take 500 mg by mouth daily with breakfast.    . methocarbamol (ROBAXIN) 500 MG tablet Take 1 tablet (500 mg total) by mouth 2 (two) times daily as needed. 20 tablet 0  . nadolol (CORGARD) 40 MG tablet Take 40 mg by mouth daily.     . naproxen sodium (ALEVE) 220 MG tablet Take 440 mg by mouth daily as needed (for pain).     . ondansetron (  ZOFRAN) 4 MG tablet Take 1 tablet (4 mg total) by mouth every 8 (eight) hours as needed for nausea or vomiting. 40 tablet 0  . pantoprazole (PROTONIX) 40 MG tablet Take 40 mg by mouth 2 (two) times daily.    [name] spironolactone (ALDACTONE) 25 MG tablet TAKE 1 TABLET BY MOUTH DAILY (Patient taking differently: Take 25 mg by mouth daily.) 90 tablet 1   No current facility-administered medications for this visit.    ALLERGIES:  No Known Allergies  PHYSICAL EXAM:  Performance status (ECOG): 1 - Symptomatic but completely ambulatory  Vitals:   03/26/21 1412  BP: (!) 166/80  Pulse: 85  Resp: 16  Temp: (!) 97.1 F (36.2 C)  SpO2: 98%   Wt Readings from Last 3 Encounters:  03/26/21 164 lb 12.8 oz (74.8 kg)  07/26/20 163 lb 3.2 oz (74 kg)  06/26/20 161 lb 3.2 oz (73.1 kg)   Physical Exam Vitals reviewed.  Constitutional:      Appearance: Normal appearance.     Comments: In wheelchair  Cardiovascular:     Rate and Rhythm: Normal rate and regular rhythm.     Pulses: Normal pulses.     Heart sounds: Normal heart sounds.  Pulmonary:     Effort: Pulmonary effort is normal.     Breath sounds: Normal breath sounds.  Musculoskeletal:     Right lower leg: No edema.     Left lower leg: No edema.  Neurological:     General: No focal deficit present.     Mental Status: She is alert and oriented to person, place, and time.  Psychiatric:        Mood and Affect: Mood normal.         Behavior: Behavior normal.     LABORATORY DATA:  I have reviewed the labs as listed.  CBC Latest Ref Rng & Units 03/06/2021 07/26/2020 06/15/2020  WBC 4.0 - 10.5 K/uL 3.7(L) 2.6(L) 2.9(L)  Hemoglobin 12.0 - 15.0 g/dL 11.2(L) 10.9(L) 10.8(L)  Hematocrit 36.0 - 46.0 % 34.4(L) 33.0(L) 31.5(L)  Platelets 150 - 400 K/uL 67(L) 68(L) 77(L)   CMP Latest Ref Rng & Units 03/06/2021 07/26/2020 06/15/2020  Glucose 70 - 99 mg/dL 158(H) 229(H) 172(H)  BUN 8 - 23 mg/dL _0 Creatinine 0.44 - 1.00 mg/dL 1.24(H) 1.20(H) 1.10(H)  Sodium 135 - 145 mmol/L 137 137 136  Potassium 3.5 - 5.1 mmol/L 3.8 3.9 4.1  Chloride 98 - 111 mmol/L 102 103 102  CO2 22 - 32 mmol/L _1 Calcium 8.9 - 10.3 mg/dL 9.4 9.2 9.0  Total Protein 6.5 - 8.1 g/dL 7.1 7.3 6.5  Total Bilirubin 0.3 - 1.2 mg/dL 1.2 1.8(H) 1.6(H)  Alkaline Phos 38 - 126 U/L 201(H) 168(H) -  AST 15 - 41 U/L 44(H) 47(H) 30  ALT 0 - 44 U/L _2 Component Value Date/Time   RBC 3.29 (L) 03/06/2021 1423   MCV 104.6 (H) 03/06/2021 1423   MCV 102.4 (H) 04/14/2016 1326   MCH 34.0 03/06/2021 1423   MCHC 32.6 03/06/2021 1423   RDW 13.4 03/06/2021 1423   RDW 13.5 04/14/2016 1326   LYMPHSABS 0.9 03/06/2021 1423   LYMPHSABS 0.7 (L) 04/14/2016 1326   MONOABS 0.4 03/06/2021 1423   MONOABS 0.3 04/14/2016 1326   EOSABS 0.2 03/06/2021 1423   EOSABS 0.1 04/14/2016 1326   BASOSABS 0.0 03/06/2021 1423   BASOSABS 0.0 04/14/2016 1326   Lab Results  Component  Value Date   LDH 142 03/06/2021   LDH 152 04/14/2016   Lab Results  Component Value Date   VD25OH 9.61 (L) 03/06/2021   Lab Results  Component Value Date   TIBC 380 03/06/2021   TIBC 351 06/21/2019   TIBC 348 04/14/2016   FERRITIN 372 (H) 03/06/2021   FERRITIN 836 (H) 06/21/2019   FERRITIN 408 (H) 04/14/2016   IRONPCTSAT 71 (H) 03/06/2021   IRONPCTSAT 93 (H) 06/21/2019   IRONPCTSAT 57 04/14/2016    DIAGNOSTIC IMAGING:  I have independently reviewed the scans and discussed with  the patient. No results found.   ASSESSMENT:  1.  Pancytopenia: -CT scan on 07/06/2019 shows normal spleen.  Does not report any recurrent infections.  No B symptoms. -She had a decrease in platelet count and white count since 2012. -Bone marrow biopsy on 08/31/2019 showed mildly hypocellular marrow with mild erythroid hyperplasia.  No significant dysplasia.  Aspirate was spicular and cellular.  Mild reactive increase in erythroid precursors.  Mild reactive disease and granulocytic precursors.  Megakaryocytes are present and morphologically unremarkable.  MDS FISH panel is negative. -She is wanting to have left hip surgery for avascular necrosis. -She took dexamethasone 40 mg for 4 days on 09/27/2019 with platelet count improved from 56-90.  2.  Elevated ferritin levels: -This is from history of hep C and cirrhosis.  Also has a history of alcohol abuse, quit completely 8 years ago. -CTAP on 07/06/2019 shows nodular contour of the liver with cirrhosis.  Spleen is normal.  Significant varices present.  No significant ascites. -Hemochromatosis labs were negative.  Elevated ferritin likely secondary to hep C and liver disease.   3.  Tobacco abuse: -Current active smoker, smokes half to 1 pack/day for the last 36 years. -Low-dose CT scan done on 11/29/2019 was lung RADS 2 follow-up in 1 year.    PLAN:  1.  Pancytopenia: -Reviewed labs from 03/06/2021.  White count 3.7. -Platelet count is 67.  Previously she had good response with steroids. -She will proceed with the left hip replacement with Dr. Alvan Dame. -She reported thinking about hurting herself because of chronic pain in the left hip for over a year.  She is not thinking anymore as she is having surgery done soon. -She was recommended to take dexamethasone 40 mg for 4 days 7 to 10 days prior to surgery. -RTC 3 months for follow-up.  2.   Vitamin D deficiency: -Vitamin D level was 9.6. -We will start her on vitamin D 50,000 units  weekly.  3.  Tobacco abuse: -She is scheduled for low-dose CT scan on 04/12/2021.   Orders placed this encounter:  Orders Placed This Encounter  Procedures  . CBC with Differential/Platelet  . VITAMIN D 25 Hydroxy (Vit-D Deficiency, Fractures)     Derek Jack, MD [place] [phone]   I, Milinda Antis, am acting as a scribe for Dr. Sanda Linger.  I, Derek Jack MD, have reviewed the above documentation for accuracy and completeness, and I agree with the above.

## 2021-03-26 NOTE — Patient Instructions (Signed)
[facility] at [facility] Discharge Instructions  You were seen today by Dr. Delton Coombes. He went over your recent results. Keep your low-dose CT scan of your chest on April 22. You may proceed with your hip replacement surgery; you will be prescribed Decadron 40 mg to take for 4 days before your surgery. You will be prescribed vitamin D 50,000 units to take once per week. Your next appointment will be with the physician assistant in 3 months for labs and follow up.   Thank you for choosing [facility] at [facility] to provide your oncology and hematology care.  To afford each patient quality time with our provider, please arrive at least 15 minutes before your scheduled appointment time.   If you have a lab appointment with the [facility] please come in thru the Main Entrance and check in at the main information desk  You need to re-schedule your appointment should you arrive 10 or more minutes late.  We strive to give you quality time with our providers, and arriving late affects you and other patients whose appointments are after yours.  Also, if you no show three or more times for appointments you may be dismissed from the clinic at the providers discretion.     Again, thank you for choosing [facility].  Our hope is that these requests will decrease the amount of time that you wait before being seen by our physicians.       _____________________________________________________________  Should you have questions after your visit to [facility], please contact our office at [phone] between the hours of 8:00 a.m. and 4:30 p.m.  Voicemails left after 4:00 p.m. will not be returned until the following business day.  For prescription refill requests, have your pharmacy contact our office and allow 72 hours.    Cancer Center Support Programs:   > Cancer Support Group  2nd Tuesday of the month 1pm-2pm, Journey Room

## 2021-04-12 ENCOUNTER — Ambulatory Visit (HOSPITAL_COMMUNITY): Admission: RE | Admit: 2021-04-12 | Payer: Medicare HMO | Source: Ambulatory Visit

## 2021-04-18 ENCOUNTER — Encounter (HOSPITAL_COMMUNITY): Payer: Self-pay

## 2021-04-18 NOTE — Progress Notes (Signed)
I have contacted the patient to reschedule LDCT. Patient rescheduled for 5/25 at 1600.

## 2021-04-19 NOTE — Patient Instructions (Addendum)
DUE TO COVID-19 ONLY ONE VISITOR IS ALLOWED TO COME WITH YOU AND STAY IN THE WAITING ROOM ONLY DURING PRE OP AND PROCEDURE DAY OF SURGERY. THE 2 VISITORS  MAY VISIT WITH YOU AFTER SURGERY IN YOUR PRIVATE ROOM DURING VISITING HOURS ONLY!    PLEASE BEGIN THE QUARANTINE INSTRUCTIONS AS OUTLINED IN YOUR HANDOUT.                Odean Hampton-Johnson    Your procedure is scheduled on: 04/30/21   Report to [facility] Main  Entrance   Report to admitting at  11:20 AM     Call this number if you have problems the morning of surgery [phone]    . BRUSH YOUR TEETH MORNING OF SURGERY AND RINSE YOUR MOUTH OUT, NO CHEWING GUM CANDY OR MINTS.   No food after midnight.    You may have clear liquid until 11:00 AM.    CLEAR LIQUID DIET   Foods Allowed                                                                     Foods Excluded  Coffee and tea, regular and decaf                             liquids that you cannot  Plain Jell-O any favor except red or purple                                           see through such as: Fruit ices (not with fruit pulp)                                     milk, soups, orange juice  Iced Popsicles                                    All solid food Carbonated beverages, regular and diet                                    Cranberry, grape and apple juices Sports drinks like Gatorade Lightly seasoned clear broth or consume(fat free) Sugar, honey syrup    Nothing by mouth after 11:00 AM.   Take these medicines the morning of surgery with A SIP OF WATER: [place], Pantoprazole  DO NOT TAKE ANY DIABETIC MEDICATIONS DAY OF YOUR SURGERY                               You may not have any metal on your body including hair pins and              piercings  Do not wear jewelry, make-up, lotions, powders or perfumes, deodorant             Do not wear nail polish on your  fingernails.  Do not shave  48 hours prior to surgery.     Do not bring valuables  to the hospital. [place].  Contacts, dentures or bridgework may not be worn into surgery.      _____________________________________________________________________             [facility] - Preparing for Surgery Before surgery, you can play an important role.  Because skin is not sterile, your skin needs to be as free of germs as possible.  You can reduce the number of germs on your skin by washing with CHG (chlorahexidine gluconate) soap before surgery.  CHG is an antiseptic cleaner which kills germs and bonds with the skin to continue killing germs even after washing. Please DO NOT use if you have an allergy to CHG or antibacterial soaps.  If your skin becomes reddened/irritated stop using the CHG and inform your nurse when you arrive at Short Stay. Do not shave (including legs and underarms) for at least 48 hours prior to the first CHG shower . Please follow these instructions carefully:  1.  Shower with CHG Soap the night before surgery and the  morning of Surgery.  2.  If you choose to wash your hair, wash your hair first as usual with your  normal  shampoo.  3.  After you shampoo, rinse your hair and body thoroughly to remove the  shampoo.                                        4.  Use CHG as you would any other liquid soap.  You can apply chg directly  to the skin and wash                       Gently with a scrungie or clean washcloth.  5.  Apply the CHG Soap to your body ONLY FROM THE NECK DOWN.   Do not use on face/ open                           Wound or open sores. Avoid contact with eyes, ears mouth and genitals (private parts).                       Wash face,  Genitals (private parts) with your normal soap.             6.  Wash thoroughly, paying special attention to the area where your surgery  will be performed.  7.  Thoroughly rinse your body with warm water from the neck down.  8.  DO NOT shower/wash with your normal  soap after using and rinsing off  the CHG Soap.             9.  Pat yourself dry with a clean towel.            10.  Wear clean pajamas.            11.  Place clean sheets on your bed the night of your first shower and do not  sleep with pets. Day of Surgery : Do not apply any lotions/deodorants the morning of surgery.  Please wear clean clothes to the hospital/surgery center.  FAILURE TO FOLLOW THESE INSTRUCTIONS MAY RESULT IN THE CANCELLATION OF YOUR SURGERY PATIENT SIGNATURE_________________________________  NURSE SIGNATURE__________________________________  ________________________________________________________________________   Adam Phenix  An incentive spirometer is a tool that can help keep your lungs clear and active. This tool measures how well you are filling your lungs with each breath. Taking long deep breaths may help reverse or decrease the chance of developing breathing (pulmonary) problems (especially infection) following:  A long period of time when you are unable to move or be active. BEFORE THE PROCEDURE   If the spirometer includes an indicator to show your best effort, your nurse or respiratory therapist will set it to a desired goal.  If possible, sit up straight or lean slightly forward. Try not to slouch.  Hold the incentive spirometer in an upright position. INSTRUCTIONS FOR USE  1. Sit on the edge of your bed if possible, or sit up as far as you can in bed or on a chair. 2. Hold the incentive spirometer in an upright position. 3. Breathe out normally. 4. Place the mouthpiece in your mouth and seal your lips tightly around it. 5. Breathe in slowly and as deeply as possible, raising the piston or the ball toward the top of the column. 6. Hold your breath for 3-5 seconds or for as long as possible. Allow the piston or ball to fall to the bottom of the column. 7. Remove the mouthpiece from your mouth and breathe out normally. 8. Rest for a few  seconds and repeat Steps 1 through 7 at least 10 times every 1-2 hours when you are awake. Take your time and take a few normal breaths between deep breaths. 9. The spirometer may include an indicator to show your best effort. Use the indicator as a goal to work toward during each repetition. 10. After each set of 10 deep breaths, practice coughing to be sure your lungs are clear. If you have an incision (the cut made at the time of surgery), support your incision when coughing by placing a pillow or rolled up towels firmly against it. Once you are able to get out of bed, walk around indoors and cough well. You may stop using the incentive spirometer when instructed by your caregiver.  RISKS AND COMPLICATIONS  Take your time so you do not get dizzy or light-headed.  If you are in pain, you may need to take or ask for pain medication before doing incentive spirometry. It is harder to take a deep breath if you are having pain. AFTER USE  Rest and breathe slowly and easily.  It can be helpful to keep track of a log of your progress. Your caregiver can provide you with a simple table to help with this. If you are using the spirometer at home, follow these instructions: [place] IF:   You are having difficultly using the spirometer.  You have trouble using the spirometer as often as instructed.  Your pain medication is not giving enough relief while using the spirometer.  You develop fever of 100.5 F (38.1 C) or higher. SEEK IMMEDIATE MEDICAL CARE IF:   You cough up bloody sputum that had not been present before.  You develop fever of 102 F (38.9 C) or greater.  You develop worsening pain at or near the incision site. MAKE SURE YOU:   Understand these instructions.  Will watch your condition.  Will get help right away if you are not doing well or get worse. Document Released: 04/20/2007 Document  Revised: 03/01/2012 Document Reviewed: 06/21/2007 ExitCare Patient  Information 2014 ExitCare, [place].   ________________________________________________________________________ How to Manage Your Diabetes Before and After Surgery  Why is it important to control my blood sugar before and after surgery? . Improving blood sugar levels before and after surgery helps healing and can limit problems. . A way of improving blood sugar control is eating a healthy diet by: o  Eating less sugar and carbohydrates o  Increasing activity/exercise o  Talking with your doctor about reaching your blood sugar goals . High blood sugars (greater than 180 mg/dL) can raise your risk of infections and slow your recovery, so you will need to focus on controlling your diabetes during the weeks before surgery. . Make sure that the doctor who takes care of your diabetes knows about your planned surgery including the date and location.  How do I manage my blood sugar before surgery? . Check your blood sugar at least 4 times a day, starting 2 days before surgery, to make sure that the level is not too high or low. o Check your blood sugar the morning of your surgery when you wake up and every 2 hours until you get to the Short Stay unit. . If your blood sugar is less than 70 mg/dL, you will need to treat for low blood sugar: o Do not take insulin. o Treat a low blood sugar (less than 70 mg/dL) with  cup of clear juice (cranberry or apple), 4 glucose tablets, OR glucose gel. o Recheck blood sugar in 15 minutes after treatment (to make sure it is greater than 70 mg/dL). If your blood sugar is not greater than 70 mg/dL on recheck, call [phone] for further instructions. . Report your blood sugar to the short stay nurse when you get to Short Stay.  . If you are admitted to the hospital after surgery: o Your blood sugar will be checked by the staff and you will probably be given insulin after surgery (instead of oral diabetes medicines) to make sure you have good blood sugar  levels. o The goal for blood sugar control after surgery is 80-180 mg/dL.   WHAT DO I DO ABOUT MY DIABETES MEDICATION?  [name] Do not take oral diabetes medicines (pills) the morning of surgery.  [name]

## 2021-04-22 ENCOUNTER — Other Ambulatory Visit: Payer: Self-pay

## 2021-04-22 ENCOUNTER — Encounter (HOSPITAL_COMMUNITY): Payer: Self-pay

## 2021-04-22 ENCOUNTER — Encounter (HOSPITAL_COMMUNITY)
Admission: RE | Admit: 2021-04-22 | Discharge: 2021-04-22 | Disposition: A | Discharge status: Home / Self Care | Payer: Medicare HMO | Source: Ambulatory Visit | Attending: Orthopedic Surgery | Admitting: Orthopedic Surgery

## 2021-04-22 DIAGNOSIS — N189 Chronic kidney disease, unspecified: Secondary | ICD-10-CM | POA: Diagnosis not present

## 2021-04-22 DIAGNOSIS — J45909 Unspecified asthma, uncomplicated: Secondary | ICD-10-CM | POA: Diagnosis not present

## 2021-04-22 DIAGNOSIS — Z7984 Long term (current) use of oral hypoglycemic drugs: Secondary | ICD-10-CM | POA: Insufficient documentation

## 2021-04-22 DIAGNOSIS — K7460 Unspecified cirrhosis of liver: Secondary | ICD-10-CM | POA: Diagnosis not present

## 2021-04-22 DIAGNOSIS — Z8619 Personal history of other infectious and parasitic diseases: Secondary | ICD-10-CM | POA: Diagnosis not present

## 2021-04-22 DIAGNOSIS — K219 Gastro-esophageal reflux disease without esophagitis: Secondary | ICD-10-CM | POA: Insufficient documentation

## 2021-04-22 DIAGNOSIS — M1612 Unilateral primary osteoarthritis, left hip: Secondary | ICD-10-CM | POA: Insufficient documentation

## 2021-04-22 DIAGNOSIS — Z01812 Encounter for preprocedural laboratory examination: Secondary | ICD-10-CM | POA: Insufficient documentation

## 2021-04-22 DIAGNOSIS — D61818 Other pancytopenia: Secondary | ICD-10-CM | POA: Insufficient documentation

## 2021-04-22 DIAGNOSIS — Z7982 Long term (current) use of aspirin: Secondary | ICD-10-CM | POA: Insufficient documentation

## 2021-04-22 DIAGNOSIS — I129 Hypertensive chronic kidney disease with stage 1 through stage 4 chronic kidney disease, or unspecified chronic kidney disease: Secondary | ICD-10-CM | POA: Insufficient documentation

## 2021-04-22 DIAGNOSIS — Z79899 Other long term (current) drug therapy: Secondary | ICD-10-CM | POA: Insufficient documentation

## 2021-04-22 DIAGNOSIS — F17210 Nicotine dependence, cigarettes, uncomplicated: Secondary | ICD-10-CM | POA: Insufficient documentation

## 2021-04-22 DIAGNOSIS — B1910 Unspecified viral hepatitis B without hepatic coma: Secondary | ICD-10-CM | POA: Diagnosis not present

## 2021-04-22 HISTORY — DX: Dyspnea, unspecified: R06.00

## 2021-04-22 LAB — CBC
HCT: 35.5 % — ABNORMAL LOW (ref 36.0–46.0)
Hemoglobin: 12.1 g/dL (ref 12.0–15.0)
MCH: 35.1 pg — ABNORMAL HIGH (ref 26.0–34.0)
MCHC: 34.1 g/dL (ref 30.0–36.0)
MCV: 102.9 fL — ABNORMAL HIGH (ref 80.0–100.0)
Platelets: 101 10*3/uL — ABNORMAL LOW (ref 150–400)
RBC: 3.45 MIL/uL — ABNORMAL LOW (ref 3.87–5.11)
RDW: 13.2 % (ref 11.5–15.5)
WBC: 6.1 10*3/uL (ref 4.0–10.5)
nRBC: 0 % (ref 0.0–0.2)

## 2021-04-22 LAB — APTT: aPTT: 30 seconds (ref 24–36)

## 2021-04-22 LAB — COMPREHENSIVE METABOLIC PANEL
ALT: 28 U/L (ref 0–44)
AST: 33 U/L (ref 15–41)
Albumin: 3.2 g/dL — ABNORMAL LOW (ref 3.5–5.0)
Alkaline Phosphatase: 175 U/L — ABNORMAL HIGH (ref 38–126)
Anion gap: 10 (ref 5–15)
BUN: 26 mg/dL — ABNORMAL HIGH (ref 8–23)
CO2: 24 mmol/L (ref 22–32)
Calcium: 9.3 mg/dL (ref 8.9–10.3)
Chloride: 100 mmol/L (ref 98–111)
Creatinine, Ser: 1.3 mg/dL — ABNORMAL HIGH (ref 0.44–1.00)
GFR, Estimated: 46 mL/min — ABNORMAL LOW (ref >60)
Glucose, Bld: 355 mg/dL — ABNORMAL HIGH (ref 70–99)
Potassium: 4.1 mmol/L (ref 3.5–5.1)
Sodium: 134 mmol/L — ABNORMAL LOW (ref 135–145)
Total Bilirubin: 1.2 mg/dL (ref 0.3–1.2)
Total Protein: 6.6 g/dL (ref 6.5–8.1)

## 2021-04-22 LAB — PROTIME-INR
INR: 1.2 (ref 0.8–1.2)
Prothrombin Time: 15.1 seconds (ref 11.4–15.2)

## 2021-04-22 LAB — GLUCOSE, CAPILLARY: Glucose-Capillary: 352 mg/dL — ABNORMAL HIGH (ref 70–99)

## 2021-04-22 NOTE — Progress Notes (Addendum)
COVID Vaccine Completed:No Date COVID Vaccine completed: COVID vaccine manufacturer: Pfizer    Moderna   [employer]   PCP - Dr. Nemiah Commander Cardiologist - Dr. Zandra Abts  Chest x-ray - no EKG - 07/26/20-epic Stress Test - no ECHO - no Cardiac Cath - no Pacemaker/ICD device last checked:NA  Sleep Study - no CPAP -   Fasting Blood Sugar - 140-180 Checks Blood Sugar _____ times a day.  2 times a week  Blood Thinner Instructions:ASA 81/ Dr. Sherrie Sport Aspirin Instructions:Stop 3 days prior to DOS/ Alvan Dame Last Dose:04/26/21  Anesthesia review:   Patient denies shortness of breath, fever, cough and chest pain at PAT appointment Yes. Pt reports that she does get SOB when she climbs 1 flight of stairs. Sometimes when she has peripheral edema she is SOB then takes lasix and it clears up. She has an inhaler that she uses as well.  Patient verbalized understanding of instructions that were given to them at the PAT appointment. Patient was also instructed that they will need to review over the PAT instructions again at home before surgery.Yes. Pt's blood sugar was 352 at the PAT visit. The PA talked with the Pt and instruction were reviewed for how to manage her diabetes.  Pt has transportation issues and will have a rapid Covid test done DOS.

## 2021-04-23 LAB — SURGICAL PCR SCREEN
MRSA, PCR: POSITIVE — AB
Staphylococcus aureus: POSITIVE — AB

## 2021-04-23 NOTE — Progress Notes (Addendum)
Anesthesia Chart Review   Case: [id number] Date/Time: 04/30/21 1405   Procedure: TOTAL HIP ARTHROPLASTY ANTERIOR APPROACH (Left Hip)   Anesthesia type: Spinal   Pre-op diagnosis: Left hip osteoarthritis   Location: [facility] RO[facility] 10 / [facility]   Surgeons: [name], MD      DISCUSSION:65 y.o. current every day smoker with h/o HTN, GERD, CKD, asthma, Hepatitis C (s/p treatment), hepatitis B antibody positive, cirrhosis (with grade 1-2 esophageal varices), pancytopenia, left hip OA scheduled for above procedure 04/30/2021 with Dr. [name].   S/p cervical fusion C3-4.   Per cardiology preoperative evaluation 02/15/2021, "Chart reviewed as part of pre-operative protocol coverage. Patient was contacted 02/15/2021 in reference to pre-operative risk assessment for pending surgery as outlined below.  Gabriella Leon was last seen on 05/15/2020 by Dr. Harl Bowie who has cleared her for hip surgery following a negative Myoview.  Since that day, Gabriella Leon has done well without any chest pain or worsening shortness of breath. Therefore, based on ACC/AHA guidelines, the patient would be at acceptable risk for the planned procedure without further cardiovascular testing."  Last seen by hematology 03/26/2021. Per OV note pt to take dexamethasone 40mg  for 4 days prior to surgery.  Platelets 101 at PAT visit 04/22/2021.   Elevated blood surgery at PAT visit.  Pt reports fasting sugar around 140s.  She reports taking diabetes medications as prescribed today.  She does state she ate a sausage biscuit and chocolate prior to coming in.  CBG non-fasting, could also be elevated due to recent steroid use.  Will evaluate DOS.  VS: BP 124/69   Pulse 65   Temp 36.9 C (Oral)   Resp 20   Ht 5\' 5"  (1.651 m)   Wt 76.7 kg   SpO2 100%   BMI 28.12 kg/m   PROVIDERS: Hasanaj, Samul Dada, MD  Carlyle Dolly, MD is Cardiologist  LABS: Labs reviewed: Acceptable for surgery. (all labs ordered are listed, but only  abnormal results are displayed)  Labs Reviewed  SURGICAL PCR SCREEN - Abnormal; Notable for the following components:      Result Value   MRSA, PCR POSITIVE (*)    Staphylococcus aureus POSITIVE (*)    All other components within normal limits  CBC - Abnormal; Notable for the following components:   RBC 3.45 (*)    HCT 35.5 (*)    MCV 102.9 (*)    MCH 35.1 (*)    Platelets 101 (*)    All other components within normal limits  COMPREHENSIVE METABOLIC PANEL - Abnormal; Notable for the following components:   Sodium 134 (*)    Glucose, Bld 355 (*)    BUN 26 (*)    Creatinine, Ser 1.30 (*)    Albumin 3.2 (*)    Alkaline Phosphatase 175 (*)    GFR, Estimated 46 (*)    All other components within normal limits  GLUCOSE, CAPILLARY - Abnormal; Notable for the following components:   Glucose-Capillary 352 (*)    All other components within normal limits  PROTIME-INR  APTT  TYPE AND SCREEN     IMAGES:   EKG: 07/26/2020 Rate 79 bpm  Unusual P axis and short PR, probable junctional tachycardia with Premature atrial complexes Septal infarct , age undetermined Abnormal ECG No significant change since last tracing  CV: Stress Test 05/17/2020  No diagnostic ST segment changes to indicate ischemia. Frequent PACs noted as well as ectopic atrial rhythm.  No significant myocardial perfusion defects to indicate focal scar  or ischemia. Borderline increased TID ratio of 1.27, cannot completely exclude balanced subendocardial ischemia.  This is a low risk study based on the absence of significant, reversible focal perfusion defects, borderline increased TID ratio not withstanding.  Nuclear stress EF: 72%.  Past Medical History:  Diagnosis Date  . Acid reflux disease with ulcer   . Anxiety   . Arthritis   . Asthma   . Blood dyscrasia    thrombocytopenia-next tx injection 09/25/15  . Blood transfusion   . CHF (congestive heart failure) ([place]) 2021  . Chronic kidney disease   .  Cirrhosis ([place])   . DDD (degenerative disc disease), lumbar   . Depression   . Diabetes mellitus   . Dyspnea   . Esophageal varices ([place]) 09/18/2014  . Folate deficiency 03/28/2014   Noted on 03/23/2014.  Folate 1 mg prescribed.  . Gallstones   . GERD (gastroesophageal reflux disease)   . Headache    migraines  . Helicobacter pylori ab+    July 2010, s/p treatment  . Hepatitis B antibody positive   . Hepatitis C    and B positive antibody  . History of alcoholism ([place])   . History of kidney stones   . Hypertension   . Left leg pain   . Liver cirrhosis ([place])   . Low back pain   . Other pancytopenia ([place]) 10/27/2013  . Ulcer    ?    Past Surgical History:  Procedure Laterality Date  . ANTERIOR CERVICAL DECOMP/DISCECTOMY FUSION N/A 09/28/2015   Procedure: Cervical three-four Anterior cervical decompression/diskectomy/fusion;  Surgeon: Leeroy Cha, MD;  Location: [facility];  Service: Neurosurgery;  Laterality: N/A;  C3-4 Anterior cervical decompression/diskectomy/fusion  . APPENDECTOMY     age 78  . BIOPSY  12/18/2011      . BIOPSY  05/19/2019   Procedure: BIOPSY;  Surgeon: Daneil Dolin, MD;  [facility];  Service: Endoscopy;;  gastric  . [place]  greater than 10 yrs   [place]  . COLONOSCOPY  12/18/11   minimal anal canal hemorrhoids, friable rectal and colonic mucosa, left-sided diverticulosis, repeat in 2022.   . ESOPHAGOGASTRODUODENOSCOPY  12/18/11   3 columns of Grade II esophageal varices, reflux esohpagitis, portal gastropathy, path with chronic gastritis, negative H.pylori, surveillance in June 2014  . ESOPHAGOGASTRODUODENOSCOPY (EGD) WITH PROPOFOL N/A 04/13/2014   Dr. [name]: grade 1-2 varices, hiatal hernia, portal gastropathy. NO need for further surveillance while on prophylaxis unless evidence of bleeding  . ESOPHAGOGASTRODUODENOSCOPY (EGD) WITH PROPOFOL N/A 05/19/2019   erosive reflux esophagitis, Grade 1-2  esophageal varices with patulous EG junction. Hiatal hernia. Portal gastropathy. Gastric and duodenal dulcer disease.   . MULTIPLE EXTRACTIONS WITH ALVEOLOPLASTY  03/08/2012   Procedure: MULTIPLE EXTRACION WITH ALVEOLOPLASTY;  Surgeon: Gae Bon, DDS;  Location: [facility];  Service: Oral Surgery;  Laterality: N/A;    MEDICATIONS: . aspirin EC 81 MG tablet  . dexamethasone (DECADRON) 4 MG tablet  . diclofenac (VOLTAREN) 75 MG EC tablet  . docusate sodium (COLACE) 100 MG capsule  . ergocalciferol (VITAMIN D2) 1.25 MG (50000 UT) capsule  . famotidine (PEPCID) 20 MG tablet  . lisinopril-hydrochlorothiazide (PRINZIDE,ZESTORETIC) 10-12.5 MG per tablet  . meloxicam (MOBIC) 15 MG tablet  . metFORMIN (GLUCOPHAGE) 500 MG tablet  . methocarbamol (ROBAXIN) 500 MG tablet  . nadolol (CORGARD) 40 MG tablet  . naproxen sodium (ALEVE) 220 MG tablet  . ondansetron (ZOFRAN) 4 MG tablet  .  pantoprazole (PROTONIX) 40 MG tablet  . spironolactone (ALDACTONE) 25 MG tablet  . thiamine (VITAMIN B-1) 100 MG tablet   No current facility-administered medications for this encounter.    Konrad Felix, PA-C [facility] [phone]

## 2021-04-23 NOTE — Anesthesia Preprocedure Evaluation (Addendum)
Anesthesia Evaluation    Airway Mallampati: I       Dental  (+) Edentulous Upper   Pulmonary Current Smoker,    Pulmonary exam normal        Cardiovascular hypertension, Pt. on medications and Pt. on home beta blockers Normal cardiovascular exam     Neuro/Psych    GI/Hepatic PUD, GERD  Medicated,  Endo/Other  diabetes, Type 2, Oral Hypoglycemic Agents  Renal/GU   negative genitourinary   Musculoskeletal   Abdominal Normal abdominal exam  (+)   Peds  Hematology  (+) Blood dyscrasia, anemia ,   Anesthesia Other Findings   Reproductive/Obstetrics                                                             Anesthesia Evaluation  Patient identified by MRN, date of birth, ID band Patient awake    Reviewed: Allergy & Precautions, NPO status , Patient's Chart, lab work & pertinent test results  Airway Mallampati: III  TM Distance: >3 FB Neck ROM: Full    Dental no notable dental hx. (+) Edentulous Upper   Pulmonary asthma , Current SmokerPatient did not abstain from smoking.,    Pulmonary exam normal breath sounds clear to auscultation       Cardiovascular Exercise Tolerance: Poor hypertension, Pt. on medications negative cardio ROS Normal cardiovascular examII Rhythm:Regular Rate:Normal  H/o Bradycardia not on B blockers  Decreased ET -states needs new hip Denies CP   Neuro/Psych  Headaches, PSYCHIATRIC DISORDERS Anxiety Depression  Neuromuscular disease    GI/Hepatic PUD, GERD  Medicated and Controlled,(+) Cirrhosis     substance abuse  alcohol use, Hepatitis -, B, CStates -no more Etoh  Cirrhosis  ITP denies spont bleeding episodes    Endo/Other  negative endocrine ROSdiabetes, Type 2  Renal/GU Renal InsufficiencyRenal disease  negative genitourinary   Musculoskeletal  (+) Arthritis , Osteoarthritis,    Abdominal   Peds negative pediatric ROS (+)   Hematology negative hematology ROS (+) anemia , ITP ~62K   Anesthesia Other Findings   Reproductive/Obstetrics negative OB ROS                             Anesthesia Physical Anesthesia Plan  ASA: IV  Anesthesia Plan: General   Post-op Pain Management:    Induction: Intravenous  PONV Risk Score and Plan: 2 and Propofol infusion, TIVA, Treatment may vary due to age or medical condition and Ondansetron  Airway Management Planned: Simple Face Mask and Nasal Cannula  Additional Equipment:   Intra-op Plan:   Post-operative Plan:   Informed Consent: I have reviewed the patients History and Physical, chart, labs and discussed the procedure including the risks, benefits and alternatives for the proposed anesthesia with the patient or authorized representative who has indicated his/her understanding and acceptance.     Dental advisory given  Plan Discussed with: CRNA  Anesthesia Plan Comments: (Plan Full PPE  Plan MAC as tolerated  GETA as needed d/w pt -WTP with same  Recently done 05/19/19 without issues )        Anesthesia Quick Evaluation  Anesthesia Physical Anesthesia Plan  ASA: II  Anesthesia Plan: Spinal   Post-op Pain Management:    Induction:   PONV Risk Score  and Plan: 1 and Ondansetron and Midazolam  Airway Management Planned: Natural Airway and Simple Face Mask  Additional Equipment: None  Intra-op Plan:   Post-operative Plan:   Informed Consent: I have reviewed the patients History and Physical, chart, labs and discussed the procedure including the risks, benefits and alternatives for the proposed anesthesia with the patient or authorized representative who has indicated his/her understanding and acceptance.     Dental advisory given  Plan Discussed with: CRNA  Anesthesia Plan Comments: (See PAT note 04/22/2021, Konrad Felix, PA-C)       Anesthesia Quick Evaluation

## 2021-04-29 NOTE — H&P (Signed)
TOTAL HIP ADMISSION H&P  Patient is admitted for left total hip arthroplasty.  Subjective:  Chief Complaint: left hip pain  HPI: Gabriella Leon, 65 y.o. female, has a history of pain and functional disability in the left hip(s) due to avascular necrosis and patient has failed non-surgical conservative treatments for greater than 12 weeks to include NSAID's and/or analgesics and activity modification.  Onset of symptoms was gradual starting 2 years ago with gradually worsening course since that time.The patient noted no past surgery on the left hip(s).  Patient currently rates pain in the left hip at 8 out of 10 with activity. Patient has worsening of pain with activity and weight bearing and pain that interfers with activities of daily living. Patient has evidence of joint space narrowing and avascular necrosis by imaging studies. This condition presents safety issues increasing the risk of falls.  There is no current active infection.  Patient Active Problem List   Diagnosis Date Noted  . Tobacco abuse 12/22/2019  . Avascular necrosis of bone of left hip ([place]) 12/05/2019  . PUD (peptic ulcer disease) 06/21/2019  . Left hip pain 12/06/2018  . Chronic pain of left knee 12/06/2018  . Chronic left-sided low back pain with left-sided sciatica 12/06/2018  . Vitamin D deficiency 04/14/2016  . Elevated ferritin 02/14/2016  . Cervical stenosis of spinal canal 09/28/2015  . Idiopathic thrombocytopenic purpura ([place]) 08/29/2015  . Thrombocytopenia ([place]) 08/29/2015  . Hepatic cirrhosis due to chronic hepatitis C infection ([place]) 04/03/2015  . Esophageal varices ([place]) 09/18/2014  . Folate deficiency 03/28/2014  . Unspecified constipation 03/22/2014  . Other pancytopenia ([place]) 10/27/2013  . Hypersplenism syndrome 09/18/2013  . Hepatitis C, chronic ([place]) 09/14/2013  . Lower back pain 06/16/2013  . ETOH abuse 03/03/2013  . RUQ pain 11/07/2012  . Epigastric pain 02/04/2012  . Cirrhosis ([place])  11/26/2011  . Abdominal pain 11/26/2011  . Screening for colon cancer 11/26/2011   Past Medical History:  Diagnosis Date  . Acid reflux disease with ulcer   . Anxiety   . Arthritis   . Asthma   . Blood dyscrasia    thrombocytopenia-next tx injection 09/25/15  . Blood transfusion   . CHF (congestive heart failure) ([place]) 2021  . Chronic kidney disease   . Cirrhosis ([place])   . DDD (degenerative disc disease), lumbar   . Depression   . Diabetes mellitus   . Dyspnea   . Esophageal varices ([place]) 09/18/2014  . Folate deficiency 03/28/2014   Noted on 03/23/2014.  Folate 1 mg prescribed.  . Gallstones   . GERD (gastroesophageal reflux disease)   . Headache    migraines  . Helicobacter pylori ab+    July 2010, s/p treatment  . Hepatitis B antibody positive   . Hepatitis C    and B positive antibody  . History of alcoholism ([place])   . History of kidney stones   . Hypertension   . Left leg pain   . Liver cirrhosis ([place])   . Low back pain   . Other pancytopenia ([place]) 10/27/2013  . Ulcer    ?    Past Surgical History:  Procedure Laterality Date  . ANTERIOR CERVICAL DECOMP/DISCECTOMY FUSION N/A 09/28/2015   Procedure: Cervical three-four Anterior cervical decompression/diskectomy/fusion;  Surgeon: Leeroy Cha, MD;  Location: [facility];  Service: Neurosurgery;  Laterality: N/A;  C3-4 Anterior cervical decompression/diskectomy/fusion  . APPENDECTOMY     age 35  . BIOPSY  12/18/2011      . BIOPSY  05/19/2019   Procedure: BIOPSY;  Surgeon: Daneil Dolin, MD;  [facility];  Service: Endoscopy;;  gastric  . [place]  greater than 10 yrs   [place]  . COLONOSCOPY  12/18/11   minimal anal canal hemorrhoids, friable rectal and colonic mucosa, left-sided diverticulosis, repeat in 2022.   . ESOPHAGOGASTRODUODENOSCOPY  12/18/11   3 columns of Grade II esophageal varices, reflux esohpagitis, portal gastropathy, path with chronic gastritis,  negative H.pylori, surveillance in June 2014  . ESOPHAGOGASTRODUODENOSCOPY (EGD) WITH PROPOFOL N/A 04/13/2014   Dr. [name]: grade 1-2 varices, hiatal hernia, portal gastropathy. NO need for further surveillance while on prophylaxis unless evidence of bleeding  . ESOPHAGOGASTRODUODENOSCOPY (EGD) WITH PROPOFOL N/A 05/19/2019   erosive reflux esophagitis, Grade 1-2 esophageal varices with patulous EG junction. Hiatal hernia. Portal gastropathy. Gastric and duodenal dulcer disease.   . MULTIPLE EXTRACTIONS WITH ALVEOLOPLASTY  03/08/2012   Procedure: MULTIPLE EXTRACION WITH ALVEOLOPLASTY;  Surgeon: Gae Bon, DDS;  Location: [facility];  Service: Oral Surgery;  Laterality: N/A;    No current facility-administered medications for this encounter.   Current Outpatient Medications  Medication Sig Dispense Refill Last Dose  . aspirin EC 81 MG tablet Take 1 tablet (81 mg total) by mouth 2 (two) times daily. 84 tablet 0   . dexamethasone (DECADRON) 4 MG tablet Take 10 tablets (40 mg total) by mouth daily. 40 tablet 0   . diclofenac (VOLTAREN) 75 MG EC tablet Take 75 mg by mouth 2 (two) times daily.     [name] docusate sodium (COLACE) 100 MG capsule Take 1 capsule (100 mg total) by mouth daily as needed. 30 capsule 2   . ergocalciferol (VITAMIN D2) 1.25 MG (50000 UT) capsule Take 1 capsule (50,000 Units total) by mouth once a week. 12 capsule 2   . famotidine (PEPCID) 20 MG tablet Take 20 mg by mouth 2 (two) times daily.      [name] lisinopril-hydrochlorothiazide (PRINZIDE,ZESTORETIC) 10-12.5 MG per tablet Take 1 tablet by mouth daily.     . metFORMIN (GLUCOPHAGE) 500 MG tablet Take 500 mg by mouth daily with breakfast.     . nadolol (CORGARD) 40 MG tablet Take 40 mg by mouth daily.      . naproxen sodium (ALEVE) 220 MG tablet Take 440 mg by mouth daily as needed (for pain).      . pantoprazole (PROTONIX) 40 MG tablet Take 40 mg by mouth 2 (two) times daily.     [name] thiamine (VITAMIN B-1) 100 MG tablet Take 100 mg by  mouth daily.     . meloxicam (MOBIC) 15 MG tablet Take 15 mg by mouth daily.     . methocarbamol (ROBAXIN) 500 MG tablet Take 1 tablet (500 mg total) by mouth 2 (two) times daily as needed. (Patient not taking: Reported on 04/17/2021) 20 tablet 0 Not Taking at Unknown time  . ondansetron (ZOFRAN) 4 MG tablet Take 1 tablet (4 mg total) by mouth every 8 (eight) hours as needed for nausea or vomiting. (Patient not taking: Reported on 04/17/2021) 40 tablet 0 Not Taking at Unknown time  . spironolactone (ALDACTONE) 25 MG tablet TAKE 1 TABLET BY MOUTH DAILY (Patient not taking: Reported on 04/17/2021) 90 tablet 1 Not Taking at Unknown time   No Known Allergies  Social History   Tobacco Use  . Smoking status: Current Every Day Smoker    Packs/day: 0.25    Years: 20.00  Pack years: 5.00    Types: Cigarettes  . Smokeless tobacco: Never Used  Substance Use Topics  . Alcohol use: No    Comment: last use 2015    Family History  Problem Relation Age of Onset  . Diabetes Mother   . Heart disease Mother   . Cancer Paternal Uncle        passed away age 32  . Other Father        died in [place] in 46  . Heart disease Sister   . Anesthesia problems Neg Hx   . Hypotension Neg Hx   . Malignant hyperthermia Neg Hx   . Pseudochol deficiency Neg Hx   . Colon cancer Neg Hx   . Liver disease Neg Hx      Review of Systems  Constitutional: Negative for chills and fever.  Respiratory: Negative for cough and shortness of breath.   Cardiovascular: Negative for chest pain.  Gastrointestinal: Negative for nausea and vomiting.  Musculoskeletal: Positive for arthralgias.    Objective:  Physical Exam Well nourished and well developed. General: Alert and oriented x3, cooperative and pleasant, no acute distress. Head: normocephalic, atraumatic, neck supple. Eyes: EOMI.  Musculoskeletal: Left hip exam: Very limited and painful left hip range of motion passively Limited active abduction and hip flexion  due to pain Right hip remains normal without reproducible pain with passive and active range of motion She uses a cane for mobilization due to her pain.  Calves soft and nontender. Motor function intact in LE. Strength 5/5 LE bilaterally. Neuro: Distal pulses 2+. Sensation to light touch intact in LE. Vital signs in last 24 hours:    Labs:   Estimated body mass index is 28.12 kg/m as calculated from the following:   Height as of 04/22/21: 5\' 5"  (1.651 m).   Weight as of 04/22/21: 76.7 kg.   Imaging Review Plain radiographs demonstrate severe degenerative joint disease of the right hip(s). The bone quality appears to be adequate for age and reported activity level.   Assessment/Plan:  End stage arthritis, right hip(s)  The patient history, physical examination, clinical judgement of the provider and imaging studies are consistent with end stage degenerative joint disease of the right hip(s) and total hip arthroplasty is deemed medically necessary. The treatment options including medical management, injection therapy, arthroscopy and arthroplasty were discussed at length. The risks and benefits of total hip arthroplasty were presented and reviewed. The risks due to aseptic loosening, infection, stiffness, dislocation/subluxation,  thromboembolic complications and other imponderables were discussed.  The patient acknowledged the explanation, agreed to proceed with the plan and consent was signed. Patient is being admitted for inpatient treatment for surgery, pain control, PT, OT, prophylactic antibiotics, VTE prophylaxis, progressive ambulation and ADL's and discharge planning.The patient is planning to be discharged home.  Therapy Plans: HEP Disposition: Home with sister Planned DVT Prophylaxis: Xarelto 10mg  daily DME needed: 3-n-1 PCP: Hematologist/Oncologist: Dr. Sherrie Sport, clearance received - followed for pancytopenia, most recent plt 77,000 Cardiologist: Dr. Harl Bowie, clearance  received TXA: IV Allergies: NKDA Anesthesia Concerns: none BMI: 28.2 Hemoglobin a1c: will recheck  Other: - Tramadol vs Oxycodone - hx of cirrhosis, avoid tylenol - On Dexamethasone now to help with platelets  Griffith Citron, PA-C Orthopedic Surgery [facility] [place] [phone]

## 2021-04-30 ENCOUNTER — Observation Stay (HOSPITAL_COMMUNITY): Payer: Medicare HMO

## 2021-04-30 ENCOUNTER — Encounter (HOSPITAL_COMMUNITY): Payer: Self-pay | Admitting: Orthopedic Surgery

## 2021-04-30 ENCOUNTER — Other Ambulatory Visit: Payer: Self-pay

## 2021-04-30 ENCOUNTER — Ambulatory Visit (HOSPITAL_COMMUNITY): Payer: Medicare HMO | Admitting: Certified Registered Nurse Anesthetist

## 2021-04-30 ENCOUNTER — Ambulatory Visit (HOSPITAL_COMMUNITY): Payer: Medicare HMO

## 2021-04-30 ENCOUNTER — Ambulatory Visit (HOSPITAL_COMMUNITY): Payer: Medicare HMO | Admitting: Physician Assistant

## 2021-04-30 ENCOUNTER — Encounter (HOSPITAL_COMMUNITY)
Admission: RE | Disposition: A | Discharge status: Home / Self Care | Payer: Self-pay | Source: Other Acute Inpatient Hospital | Attending: Orthopedic Surgery

## 2021-04-30 ENCOUNTER — Observation Stay (HOSPITAL_COMMUNITY)
Admission: RE | Admit: 2021-04-30 | Discharge: 2021-05-01 | Disposition: A | Discharge status: Home / Self Care | Payer: Medicare HMO | Source: Other Acute Inpatient Hospital | Attending: Orthopedic Surgery | Admitting: Orthopedic Surgery

## 2021-04-30 DIAGNOSIS — F17210 Nicotine dependence, cigarettes, uncomplicated: Secondary | ICD-10-CM | POA: Diagnosis not present

## 2021-04-30 DIAGNOSIS — I130 Hypertensive heart and chronic kidney disease with heart failure and stage 1 through stage 4 chronic kidney disease, or unspecified chronic kidney disease: Secondary | ICD-10-CM | POA: Diagnosis not present

## 2021-04-30 DIAGNOSIS — Z471 Aftercare following joint replacement surgery: Secondary | ICD-10-CM | POA: Diagnosis not present

## 2021-04-30 DIAGNOSIS — M1612 Unilateral primary osteoarthritis, left hip: Principal | ICD-10-CM | POA: Insufficient documentation

## 2021-04-30 DIAGNOSIS — Z7982 Long term (current) use of aspirin: Secondary | ICD-10-CM | POA: Insufficient documentation

## 2021-04-30 DIAGNOSIS — Z419 Encounter for procedure for purposes other than remedying health state, unspecified: Secondary | ICD-10-CM

## 2021-04-30 DIAGNOSIS — I509 Heart failure, unspecified: Secondary | ICD-10-CM | POA: Diagnosis not present

## 2021-04-30 DIAGNOSIS — E1122 Type 2 diabetes mellitus with diabetic chronic kidney disease: Secondary | ICD-10-CM | POA: Diagnosis not present

## 2021-04-30 DIAGNOSIS — J45909 Unspecified asthma, uncomplicated: Secondary | ICD-10-CM | POA: Insufficient documentation

## 2021-04-30 DIAGNOSIS — Z79899 Other long term (current) drug therapy: Secondary | ICD-10-CM | POA: Diagnosis not present

## 2021-04-30 DIAGNOSIS — E559 Vitamin D deficiency, unspecified: Secondary | ICD-10-CM | POA: Diagnosis not present

## 2021-04-30 DIAGNOSIS — Z96649 Presence of unspecified artificial hip joint: Secondary | ICD-10-CM

## 2021-04-30 DIAGNOSIS — Z20822 Contact with and (suspected) exposure to covid-19: Secondary | ICD-10-CM | POA: Diagnosis not present

## 2021-04-30 DIAGNOSIS — Z7984 Long term (current) use of oral hypoglycemic drugs: Secondary | ICD-10-CM | POA: Insufficient documentation

## 2021-04-30 DIAGNOSIS — K219 Gastro-esophageal reflux disease without esophagitis: Secondary | ICD-10-CM | POA: Diagnosis not present

## 2021-04-30 DIAGNOSIS — N189 Chronic kidney disease, unspecified: Secondary | ICD-10-CM | POA: Diagnosis not present

## 2021-04-30 DIAGNOSIS — Z96642 Presence of left artificial hip joint: Secondary | ICD-10-CM | POA: Diagnosis not present

## 2021-04-30 DIAGNOSIS — D696 Thrombocytopenia, unspecified: Secondary | ICD-10-CM | POA: Diagnosis not present

## 2021-04-30 HISTORY — PX: TOTAL HIP ARTHROPLASTY: SHX124

## 2021-04-30 LAB — TYPE AND SCREEN
ABO/RH(D): O NEG
Antibody Screen: NEGATIVE

## 2021-04-30 LAB — GLUCOSE, CAPILLARY
Glucose-Capillary: 127 mg/dL — ABNORMAL HIGH (ref 70–99)
Glucose-Capillary: 163 mg/dL — ABNORMAL HIGH (ref 70–99)

## 2021-04-30 LAB — RESP PANEL BY RT-PCR (FLU A&B, COVID) ARPGX2
Influenza A by PCR: NEGATIVE
Influenza B by PCR: NEGATIVE
SARS Coronavirus 2 by RT PCR: NEGATIVE

## 2021-04-30 SURGERY — ARTHROPLASTY, HIP, TOTAL, ANTERIOR APPROACH
Anesthesia: Spinal | Site: Hip | Laterality: Left

## 2021-04-30 MED ORDER — THIAMINE HCL 100 MG PO TABS
100.0000 mg | ORAL_TABLET | Freq: Every day | ORAL | Status: DC
  Administered 2021-05-01: 100 mg via ORAL
  Filled 2021-04-30: qty 1

## 2021-04-30 MED ORDER — FENTANYL CITRATE (PF) 100 MCG/2ML IJ SOLN
INTRAMUSCULAR | Status: AC
  Filled 2021-04-30: qty 2

## 2021-04-30 MED ORDER — HYDROMORPHONE HCL 1 MG/ML IJ SOLN
0.2500 mg | INTRAMUSCULAR | Status: DC | PRN
  Administered 2021-04-30 (×4): 0.5 mg via INTRAVENOUS

## 2021-04-30 MED ORDER — HYDROMORPHONE HCL 1 MG/ML IJ SOLN
INTRAMUSCULAR | Status: AC
  Filled 2021-04-30: qty 1

## 2021-04-30 MED ORDER — VANCOMYCIN HCL IN DEXTROSE 1-5 GM/200ML-% IV SOLN
1000.0000 mg | INTRAVENOUS | Status: AC
  Administered 2021-04-30: 1000 mg via INTRAVENOUS
  Filled 2021-04-30: qty 200

## 2021-04-30 MED ORDER — ACETAMINOPHEN 325 MG PO TABS: 325.0000 mg | ORAL_TABLET | Freq: Four times a day (QID) | ORAL | Status: DC | PRN

## 2021-04-30 MED ORDER — FAMOTIDINE 20 MG PO TABS
20.0000 mg | ORAL_TABLET | Freq: Two times a day (BID) | ORAL | Status: DC
  Administered 2021-04-30 – 2021-05-01 (×2): 20 mg via ORAL
  Filled 2021-04-30 (×2): qty 1

## 2021-04-30 MED ORDER — POLYETHYLENE GLYCOL 3350 17 G PO PACK: 17.0000 g | PACK | Freq: Every day | ORAL | Status: DC | PRN

## 2021-04-30 MED ORDER — DOCUSATE SODIUM 100 MG PO CAPS
100.0000 mg | ORAL_CAPSULE | Freq: Two times a day (BID) | ORAL | Status: DC
  Administered 2021-04-30 – 2021-05-01 (×2): 100 mg via ORAL
  Filled 2021-04-30 (×2): qty 1

## 2021-04-30 MED ORDER — CEFAZOLIN SODIUM-DEXTROSE 2-4 GM/100ML-% IV SOLN
2.0000 g | Freq: Four times a day (QID) | INTRAVENOUS | Status: AC
  Administered 2021-04-30 – 2021-05-01 (×2): 2 g via INTRAVENOUS
  Filled 2021-04-30 (×2): qty 100

## 2021-04-30 MED ORDER — ONDANSETRON HCL 4 MG/2ML IJ SOLN
INTRAMUSCULAR | Status: DC | PRN
  Administered 2021-04-30: 4 mg via INTRAVENOUS

## 2021-04-30 MED ORDER — EPHEDRINE SULFATE 50 MG/ML IJ SOLN
INTRAMUSCULAR | Status: DC | PRN
  Administered 2021-04-30: 15 mg via INTRAVENOUS
  Administered 2021-04-30: 10 mg via INTRAVENOUS

## 2021-04-30 MED ORDER — TRANEXAMIC ACID-NACL 1000-0.7 MG/100ML-% IV SOLN
1000.0000 mg | Freq: Once | INTRAVENOUS | Status: AC
  Administered 2021-04-30: 1000 mg via INTRAVENOUS
  Filled 2021-04-30: qty 100

## 2021-04-30 MED ORDER — MIDAZOLAM HCL 5 MG/5ML IJ SOLN
INTRAMUSCULAR | Status: DC | PRN
  Administered 2021-04-30: 2 mg via INTRAVENOUS

## 2021-04-30 MED ORDER — HYDROMORPHONE HCL 1 MG/ML IJ SOLN: 0.5000 mg | INTRAMUSCULAR | Status: DC | PRN

## 2021-04-30 MED ORDER — PANTOPRAZOLE SODIUM 40 MG PO TBEC
40.0000 mg | DELAYED_RELEASE_TABLET | Freq: Two times a day (BID) | ORAL | Status: DC
  Administered 2021-04-30 – 2021-05-01 (×2): 40 mg via ORAL
  Filled 2021-04-30 (×2): qty 1

## 2021-04-30 MED ORDER — LIDOCAINE 2% (20 MG/ML) 5 ML SYRINGE
INTRAMUSCULAR | Status: DC | PRN
  Administered 2021-04-30: 80 mg via INTRAVENOUS

## 2021-04-30 MED ORDER — MEPERIDINE HCL 50 MG/ML IJ SOLN: 6.2500 mg | INTRAMUSCULAR | Status: DC | PRN

## 2021-04-30 MED ORDER — DEXMEDETOMIDINE HCL 200 MCG/2ML IV SOLN
INTRAVENOUS | Status: DC | PRN
  Administered 2021-04-30: 12 ug via INTRAVENOUS
  Administered 2021-04-30: 8 ug via INTRAVENOUS

## 2021-04-30 MED ORDER — HYDROCHLOROTHIAZIDE 12.5 MG PO CAPS
12.5000 mg | ORAL_CAPSULE | Freq: Every day | ORAL | Status: DC
  Administered 2021-05-01: 12.5 mg via ORAL
  Filled 2021-04-30: qty 1

## 2021-04-30 MED ORDER — ORAL CARE MOUTH RINSE: 15.0000 mL | Freq: Once | OROMUCOSAL | Status: AC

## 2021-04-30 MED ORDER — FENTANYL CITRATE (PF) 250 MCG/5ML IJ SOLN
INTRAMUSCULAR | Status: AC
  Filled 2021-04-30: qty 5

## 2021-04-30 MED ORDER — MENTHOL 3 MG MT LOZG: 1.0000 | LOZENGE | OROMUCOSAL | Status: DC | PRN

## 2021-04-30 MED ORDER — SODIUM CHLORIDE 0.9 % IR SOLN
Status: DC | PRN
  Administered 2021-04-30: 1000 mL

## 2021-04-30 MED ORDER — DEXAMETHASONE SODIUM PHOSPHATE 10 MG/ML IJ SOLN
8.0000 mg | Freq: Once | INTRAMUSCULAR | Status: AC
  Administered 2021-04-30: 8 mg via INTRAVENOUS

## 2021-04-30 MED ORDER — ONDANSETRON HCL 4 MG PO TABS: 4.0000 mg | ORAL_TABLET | Freq: Four times a day (QID) | ORAL | Status: DC | PRN

## 2021-04-30 MED ORDER — DEXAMETHASONE 4 MG PO TABS: 40.0000 mg | ORAL_TABLET | Freq: Every day | ORAL | Status: DC

## 2021-04-30 MED ORDER — CHLORHEXIDINE GLUCONATE 0.12 % MT SOLN
15.0000 mL | Freq: Once | OROMUCOSAL | Status: AC
  Administered 2021-04-30: 15 mL via OROMUCOSAL

## 2021-04-30 MED ORDER — PHENYLEPHRINE HCL (PRESSORS) 10 MG/ML IV SOLN
INTRAVENOUS | Status: DC | PRN
  Administered 2021-04-30 (×2): 80 ug via INTRAVENOUS

## 2021-04-30 MED ORDER — KETOROLAC TROMETHAMINE 30 MG/ML IJ SOLN
INTRAMUSCULAR | Status: AC
  Filled 2021-04-30: qty 1

## 2021-04-30 MED ORDER — PROPOFOL 500 MG/50ML IV EMUL
INTRAVENOUS | Status: DC | PRN
  Administered 2021-04-30: 160 mg via INTRAVENOUS

## 2021-04-30 MED ORDER — SODIUM CHLORIDE 0.9 % IV SOLN: INTRAVENOUS | Status: DC

## 2021-04-30 MED ORDER — DIPHENHYDRAMINE HCL 12.5 MG/5ML PO ELIX
12.5000 mg | ORAL_SOLUTION | ORAL | Status: DC | PRN
Start: 2021-04-30 — End: 2021-05-01
  Administered 2021-04-30 – 2021-05-01 (×2): 25 mg via ORAL
  Filled 2021-04-30 (×2): qty 10

## 2021-04-30 MED ORDER — BISACODYL 10 MG RE SUPP: 10.0000 mg | Freq: Every day | RECTAL | Status: DC | PRN

## 2021-04-30 MED ORDER — VANCOMYCIN HCL IN DEXTROSE 1-5 GM/200ML-% IV SOLN: 1000.0000 mg | Freq: Once | INTRAVENOUS | Status: DC

## 2021-04-30 MED ORDER — MIDAZOLAM HCL 2 MG/2ML IJ SOLN
1.0000 mg | Freq: Once | INTRAMUSCULAR | Status: AC
  Administered 2021-04-30: 1 mg via INTRAVENOUS

## 2021-04-30 MED ORDER — ASPIRIN 81 MG PO CHEW: 81.0000 mg | CHEWABLE_TABLET | Freq: Two times a day (BID) | ORAL | Status: DC

## 2021-04-30 MED ORDER — KETOROLAC TROMETHAMINE 30 MG/ML IJ SOLN
30.0000 mg | Freq: Once | INTRAMUSCULAR | Status: AC | PRN
  Administered 2021-04-30: 30 mg via INTRAVENOUS

## 2021-04-30 MED ORDER — POVIDONE-IODINE 10 % EX SWAB
2.0000 "application " | Freq: Once | CUTANEOUS | Status: AC
  Administered 2021-04-30: 2 via TOPICAL

## 2021-04-30 MED ORDER — MIDAZOLAM HCL 2 MG/2ML IJ SOLN
INTRAMUSCULAR | Status: AC
  Filled 2021-04-30: qty 2

## 2021-04-30 MED ORDER — DEXAMETHASONE SODIUM PHOSPHATE 10 MG/ML IJ SOLN
10.0000 mg | Freq: Once | INTRAMUSCULAR | Status: AC
  Administered 2021-05-01: 10 mg via INTRAVENOUS
  Filled 2021-04-30: qty 1

## 2021-04-30 MED ORDER — PROMETHAZINE HCL 25 MG/ML IJ SOLN: 6.2500 mg | INTRAMUSCULAR | Status: DC | PRN

## 2021-04-30 MED ORDER — NADOLOL 20 MG PO TABS
40.0000 mg | ORAL_TABLET | Freq: Every day | ORAL | Status: DC
  Administered 2021-05-01: 40 mg via ORAL
  Filled 2021-04-30 (×2): qty 1
  Filled 2021-04-30: qty 2

## 2021-04-30 MED ORDER — PHENOL 1.4 % MT LIQD: 1.0000 | OROMUCOSAL | Status: DC | PRN

## 2021-04-30 MED ORDER — ONDANSETRON HCL 4 MG/2ML IJ SOLN: 4.0000 mg | Freq: Four times a day (QID) | INTRAMUSCULAR | Status: DC | PRN

## 2021-04-30 MED ORDER — FENTANYL CITRATE (PF) 100 MCG/2ML IJ SOLN
INTRAMUSCULAR | Status: DC | PRN
  Administered 2021-04-30 (×3): 50 ug via INTRAVENOUS
  Administered 2021-04-30: 100 ug via INTRAVENOUS
  Administered 2021-04-30 (×2): 50 ug via INTRAVENOUS

## 2021-04-30 MED ORDER — OXYCODONE HCL 5 MG PO TABS
5.0000 mg | ORAL_TABLET | ORAL | Status: DC | PRN
  Administered 2021-05-01: 10 mg via ORAL
  Administered 2021-05-01: 5 mg via ORAL
  Filled 2021-04-30 (×3): qty 1

## 2021-04-30 MED ORDER — LACTATED RINGERS IV SOLN: INTRAVENOUS | Status: DC

## 2021-04-30 MED ORDER — METHOCARBAMOL 1000 MG/10ML IJ SOLN
500.0000 mg | Freq: Four times a day (QID) | INTRAVENOUS | Status: DC | PRN
  Filled 2021-04-30: qty 5

## 2021-04-30 MED ORDER — METOCLOPRAMIDE HCL 5 MG/ML IJ SOLN: 5.0000 mg | Freq: Three times a day (TID) | INTRAMUSCULAR | Status: DC | PRN

## 2021-04-30 MED ORDER — METFORMIN HCL 500 MG PO TABS
500.0000 mg | ORAL_TABLET | Freq: Every day | ORAL | Status: DC
  Administered 2021-05-01: 500 mg via ORAL
  Filled 2021-04-30: qty 1

## 2021-04-30 MED ORDER — FERROUS SULFATE 325 (65 FE) MG PO TABS
325.0000 mg | ORAL_TABLET | Freq: Three times a day (TID) | ORAL | Status: DC
  Administered 2021-05-01 (×2): 325 mg via ORAL
  Filled 2021-04-30 (×2): qty 1

## 2021-04-30 MED ORDER — METOCLOPRAMIDE HCL 5 MG PO TABS: 5.0000 mg | ORAL_TABLET | Freq: Three times a day (TID) | ORAL | Status: DC | PRN

## 2021-04-30 MED ORDER — TRANEXAMIC ACID-NACL 1000-0.7 MG/100ML-% IV SOLN
1000.0000 mg | INTRAVENOUS | Status: AC
  Administered 2021-04-30: 1000 mg via INTRAVENOUS
  Filled 2021-04-30: qty 100

## 2021-04-30 MED ORDER — LISINOPRIL 10 MG PO TABS
10.0000 mg | ORAL_TABLET | Freq: Every day | ORAL | Status: DC
  Administered 2021-05-01: 10 mg via ORAL
  Filled 2021-04-30: qty 1

## 2021-04-30 MED ORDER — CEFAZOLIN SODIUM-DEXTROSE 2-4 GM/100ML-% IV SOLN
2.0000 g | INTRAVENOUS | Status: AC
  Administered 2021-04-30: 2 g via INTRAVENOUS
  Filled 2021-04-30: qty 100

## 2021-04-30 MED ORDER — LISINOPRIL-HYDROCHLOROTHIAZIDE 10-12.5 MG PO TABS: 1.0000 | ORAL_TABLET | Freq: Every day | ORAL | Status: DC

## 2021-04-30 MED ORDER — METHOCARBAMOL 500 MG PO TABS
500.0000 mg | ORAL_TABLET | Freq: Four times a day (QID) | ORAL | Status: DC | PRN
  Administered 2021-05-01: 500 mg via ORAL
  Filled 2021-04-30 (×2): qty 1

## 2021-04-30 MED ORDER — TRAMADOL HCL 50 MG PO TABS: 50.0000 mg | ORAL_TABLET | Freq: Four times a day (QID) | ORAL | Status: DC | PRN

## 2021-04-30 SURGICAL SUPPLY — 43 items
BAG DECANTER FOR FLEXI CONT (MISCELLANEOUS) IMPLANT
BAG ZIPLOCK 12X15 (MISCELLANEOUS) ×2 IMPLANT
BALL HIP CERAMIC (Hips) ×1 IMPLANT
BLADE SAG 18X100X1.27 (BLADE) ×2 IMPLANT
COVER PERINEAL POST (MISCELLANEOUS) ×2 IMPLANT
COVER SURGICAL LIGHT HANDLE (MISCELLANEOUS) ×2 IMPLANT
COVER WAND RF STERILE (DRAPES) IMPLANT
CUP ACET PINNACLE SECTR 50MM (Hips) ×1 IMPLANT
DERMABOND ADVANCED (GAUZE/BANDAGES/DRESSINGS) ×1
DERMABOND ADVANCED .7 DNX12 (GAUZE/BANDAGES/DRESSINGS) ×1 IMPLANT
DRAPE STERI IOBAN 125X83 (DRAPES) ×2 IMPLANT
DRAPE U-SHAPE 47X51 STRL (DRAPES) ×4 IMPLANT
DRESSING AQUACEL AG SP 3.5X10 (GAUZE/BANDAGES/DRESSINGS) ×1 IMPLANT
DRSG AQUACEL AG SP 3.5X10 (GAUZE/BANDAGES/DRESSINGS) ×2
DURAPREP 26ML APPLICATOR (WOUND CARE) ×2 IMPLANT
ELECT REM PT RETURN 15FT ADLT (MISCELLANEOUS) ×2 IMPLANT
ELIMINATOR HOLE APEX DEPUY (Hips) ×2 IMPLANT
GLOVE SURG ENC MOIS LTX SZ6 (GLOVE) ×4 IMPLANT
GLOVE SURG ORTHO LTX SZ7.5 (GLOVE) ×4 IMPLANT
GLOVE SURG UNDER LTX SZ7.5 (GLOVE) ×2 IMPLANT
GLOVE SURG UNDER POLY LF SZ6.5 (GLOVE) ×2 IMPLANT
GOWN STRL REUS W/TWL LRG LVL3 (GOWN DISPOSABLE) ×4 IMPLANT
HIP BALL CERAMIC (Hips) ×2 IMPLANT
HOLDER FOLEY CATH W/STRAP (MISCELLANEOUS) IMPLANT
KIT TURNOVER KIT A (KITS) ×2 IMPLANT
LINER ACET PNNCL PLUS4 NEUTRAL (Hips) ×1 IMPLANT
PACK ANTERIOR HIP CUSTOM (KITS) ×2 IMPLANT
PENCIL SMOKE EVACUATOR (MISCELLANEOUS) ×2 IMPLANT
PINNACLE PLUS 4 NEUTRAL (Hips) ×2 IMPLANT
PINNACLE SECTOR CUP 50MM (Hips) ×2 IMPLANT
SCREW 6.5MMX25MM (Screw) ×2 IMPLANT
SCREW 6.5MMX30MM (Screw) ×2 IMPLANT
STEM FEM ACTIS HIGH SZ3 (Stem) ×2 IMPLANT
SUT MNCRL AB 4-0 PS2 18 (SUTURE) ×2 IMPLANT
SUT STRATAFIX 0 PDS 27 VIOLET (SUTURE) ×2
SUT VIC AB 1 CT1 36 (SUTURE) ×6 IMPLANT
SUT VIC AB 2-0 CT1 27 (SUTURE) ×2
SUT VIC AB 2-0 CT1 TAPERPNT 27 (SUTURE) ×2 IMPLANT
SUTURE STRATFX 0 PDS 27 VIOLET (SUTURE) ×1 IMPLANT
TRAY FOLEY MTR SLVR 14FR STAT (SET/KITS/TRAYS/PACK) IMPLANT
TRAY FOLEY MTR SLVR 16FR STAT (SET/KITS/TRAYS/PACK) IMPLANT
TUBE SUCTION HIGH CAP CLEAR NV (SUCTIONS) ×4 IMPLANT
WATER STERILE IRR 1000ML POUR (IV SOLUTION) ×4 IMPLANT

## 2021-04-30 NOTE — Anesthesia Procedure Notes (Signed)
Procedure Name: LMA Insertion Performed by: Vershawn Westrup J, CRNA Pre-anesthesia Checklist: Patient identified, Emergency Drugs available, Suction available, Patient being monitored and Timeout performed Patient Re-evaluated:Patient Re-evaluated prior to induction Oxygen Delivery Method: Circle system utilized Preoxygenation: Pre-oxygenation with 100% oxygen Induction Type: IV induction Ventilation: Mask ventilation without difficulty LMA: LMA inserted LMA Size: 4.0 Number of attempts: 1 Placement Confirmation: positive ETCO2 and breath sounds checked- equal and bilateral Tube secured with: Tape Dental Injury: Teeth and Oropharynx as per pre-operative assessment        

## 2021-04-30 NOTE — Anesthesia Procedure Notes (Addendum)
Spinal  Patient location during procedure: OR Start time: 04/30/2021 2:30 PM End time: 04/30/2021 2:37 PM Reason for block: surgical anesthesia Staffing Performed: anesthesiologist  Anesthesiologist: Lyn Hollingshead, MD Preanesthetic Checklist Completed: patient identified, IV checked, site marked, risks and benefits discussed, surgical consent, monitors and equipment checked, pre-op evaluation and timeout performed Spinal Block Patient position: sitting Prep: DuraPrep and site prepped and draped Patient monitoring: continuous pulse ox and blood pressure Approach: midline Location: L3-4 Injection technique: single-shot Needle Needle type: Pencan  Needle gauge: 24 G Needle length: 10 cm Needle insertion depth: 6 cm Assessment Sensory level: T4 Events: failed spinal

## 2021-04-30 NOTE — Transfer of Care (Signed)
Immediate Anesthesia Transfer of Care Note  Patient: Gabriella Leon  Procedure(s) Performed: TOTAL HIP ARTHROPLASTY ANTERIOR APPROACH (Left Hip)  Patient Location: PACU  Anesthesia Type:General  Level of Consciousness: sedated, patient cooperative and responds to stimulation  Airway & Oxygen Therapy: Patient Spontanous Breathing and Patient connected to face mask oxygen  Post-op Assessment: Report given to RN and Post -op Vital signs reviewed and stable  Post vital signs: Reviewed and stable  Last Vitals:  Vitals Value Taken Time  BP 181/85 04/30/21 1620  Temp 36.6 C 04/30/21 1620  Pulse 59 04/30/21 1623  Resp 20 04/30/21 1623  SpO2 100 % 04/30/21 1623  Vitals shown include unvalidated device data.  Last Pain:  Vitals:   04/30/21 1156  TempSrc: Oral  PainSc: 8       Patients Stated Pain Goal: 4 (86/57/84 6962)  Complications: No complications documented.

## 2021-04-30 NOTE — Anesthesia Postprocedure Evaluation (Signed)
Anesthesia Post Note  Patient: Gabriella Leon  Procedure(s) Performed: TOTAL HIP ARTHROPLASTY ANTERIOR APPROACH (Left Hip)     Patient location during evaluation: PACU Anesthesia Type: General Level of consciousness: sedated Pain management: pain level controlled Vital Signs Assessment: post-procedure vital signs reviewed and stable Respiratory status: spontaneous breathing and patient connected to nasal cannula oxygen Cardiovascular status: stable Postop Assessment: no apparent nausea or vomiting Anesthetic complications: no   No complications documented.  Last Vitals:  Vitals:   04/30/21 1700 04/30/21 1715  BP: 127/72 131/64  Pulse: (!) 57 (!) 52  Resp: 10 11  Temp:    SpO2: 100% 100%    Last Pain:  Vitals:   04/30/21 1715  TempSrc:   PainSc: [name]

## 2021-04-30 NOTE — Interval H&P Note (Signed)
History and Physical Interval Note:  04/30/2021 11:41 AM  Gabriella Leon  has presented today for surgery, with the diagnosis of Left hip osteoarthritis.  The various methods of treatment have been discussed with the patient and family. After consideration of risks, benefits and other options for treatment, the patient has consented to  Procedure(s): TOTAL HIP ARTHROPLASTY ANTERIOR APPROACH (Left) as a surgical intervention.  The patient's history has been reviewed, patient examined, no change in status, stable for surgery.  I have reviewed the patient's chart and labs.  Questions were answered to the patient's satisfaction.     Mauri Pole

## 2021-04-30 NOTE — Care Plan (Signed)
Ortho Bundle Case Management Note  Patient Details  Name: Gabriella Leon MRN: [id number] Date of Birth: Oct 10, 1956                  L THA on 04/30/21. DCP: Home with sister. 1 story home with 3 steps. Also has ramp entrance. DME: Has RW. 3in1 ordered through [facility]. PT: HEP   DME Arranged:  3-N-1 DME Agency:  [employer]  HH Arranged:    [place] Agency:     Additional Comments: Please contact me with any questions of if this plan should need to change.  Marianne Sofia, RN,CCM [facility]  [phone] 04/30/2021, 1:24 PM

## 2021-04-30 NOTE — Op Note (Signed)
NAMETanishia Lemaster NO.: [id number]      MEDICAL RECORD NO.: [id number]      FACILITY:  [facility]      PHYSICIAN:  Mauri Pole  DATE OF BIRTH:  [birth year]     DATE OF PROCEDURE:  04/30/2021                                 OPERATIVE REPORT         PREOPERATIVE DIAGNOSIS: Left  hip osteoarthritis.      POSTOPERATIVE DIAGNOSIS:  Left hip osteoarthritis.      PROCEDURE:  Left total hip replacement through an anterior approach   utilizing DePuy THR system, component size 50 mm pinnacle cup, a size 32+4 neutral   Altrex liner, a size 3 Hi Actis stem with a 32+5 delta ceramic   ball.      SURGEON:  Pietro Cassis. Alvan Dame, M.D.      ASSISTANT:  Griffith Citron, PA-C     ANESTHESIA:  General.      SPECIMENS:  None.      COMPLICATIONS:  None.      BLOOD LOSS:  600 cc     DRAINS:  None.      INDICATION OF THE PROCEDURE:  Gabriella Leon is a 65 y.o. female who had   presented to office for evaluation of left hip pain.  Radiographs revealed   progressive degenerative changes with bone-on-bone   articulation of the  hip joint, including subchondral cystic changes and osteophytes.  The patient had painful limited range of   motion significantly affecting their overall quality of life and function.  The patient was failing to    respond to conservative measures including medications and/or injections and activity modification and at this point was ready   to proceed with more definitive measures.  Consent was obtained for   benefit of pain relief.  Specific risks of infection, DVT, component   failure, dislocation, neurovascular injury, and need for revision surgery were reviewed in the office as well discussion of   the anterior versus posterior approach were reviewed.     PROCEDURE IN DETAIL:  The patient was brought to operative theater.   Once adequate anesthesia, preoperative antibiotics, 2 gm of Ancef, 1 gm of Tranexamic  Acid, and 10 mg of Decadron were administered, the patient was positioned supine on the [employer] table.  Once the patient was safely positioned with adequate padding of boney prominences we predraped out the hip, and used fluoroscopy to confirm orientation of the pelvis.      The left hip was then prepped and draped from proximal iliac crest to   mid thigh with a shower curtain technique.      Time-out was performed identifying the patient, planned procedure, and the appropriate extremity.     An incision was then made 2 cm lateral to the   anterior superior iliac spine extending over the orientation of the   tensor fascia lata muscle and sharp dissection was carried down to the   fascia of the muscle.      The fascia was then incised.  The muscle belly was identified and swept   laterally and retractor placed along the superior neck.  Following   cauterization of the circumflex vessels and removing some pericapsular  fat, a second cobra retractor was placed on the inferior neck.  A T-capsulotomy was made along the line of the   superior neck to the trochanteric fossa, then extended proximally and   distally.  Tag sutures were placed and the retractors were then placed   intracapsular.  We then identified the trochanteric fossa and   orientation of my neck cut and then made a neck osteotomy with the femur on traction.  The femoral   head was removed without difficulty or complication.  Traction was let   off and retractors were placed posterior and anterior around the   acetabulum.      The labrum and foveal tissue were debrided.  I began reaming with a 44 mm   reamer and reamed up to 49 mm reamer with good bony bed preparation and a 50 mm  cup was chosen.  The final 50 mm Pinnacle cup was then impacted under fluoroscopy to confirm the depth of penetration and orientation with respect to   Abduction and forward flexion.  A screw was placed into the ilium followed by the hole eliminator.   The final   32+4 neutral Altrex liner was impacted with good visualized rim fit.  The cup was positioned anatomically within the acetabular portion of the pelvis.      At this point, the femur was rolled to 100 degrees.  Further capsule was   released off the inferior aspect of the femoral neck.  I then   released the superior capsule proximally.  With the leg in a neutral position the hook was placed laterally   along the femur under the vastus lateralis origin and elevated manually and then held in position using the hook attachment on the bed.  The leg was then extended and adducted with the leg rolled to 100   degrees of external rotation.  Retractors were placed along the medial calcar and posteriorly over the greater trochanter.  Once the proximal femur was fully   exposed, I used a box osteotome to set orientation.  I then began   broaching with the starting chili pepper broach and passed this by hand and then broached up to 3.  With the 3 broach in place I chose a high offset neck and did several trial reductions.  The offset was appropriate, leg lengths   appeared to be equal best matched with the +5 head ball trial confirmed radiographically.   Given these findings, I went ahead and dislocated the hip, repositioned all   retractors and positioned the right hip in the extended and abducted position.  The final 3 Hi Actis stem was   chosen and it was impacted down to the level of neck cut.  Based on this   and the trial reductions, a final 32+5 delta ceramic ball was chosen and   impacted onto a clean and dry trunnion, and the hip was reduced.  The   hip had been irrigated throughout the case again at this point.  I did   reapproximate the superior capsular leaflet to the anterior leaflet   using #1 Vicryl.  The fascia of the   tensor fascia lata muscle was then reapproximated using #1 Vicryl and #0 Stratafix sutures.  The   remaining wound was closed with 2-0 Vicryl and running 4-0  Monocryl.   The hip was cleaned, dried, and dressed sterilely using Dermabond and   Aquacel dressing.  The patient was then brought   to recovery room in  stable condition tolerating the procedure well.    Griffith Citron, PA-C was present for the entirety of the case involved from   preoperative positioning, perioperative retractor management, general   facilitation of the case, as well as primary wound closure as assistant.            Pietro Cassis Alvan Dame, M.D.        04/30/2021 1:03 PM

## 2021-05-01 DIAGNOSIS — F17210 Nicotine dependence, cigarettes, uncomplicated: Secondary | ICD-10-CM | POA: Diagnosis not present

## 2021-05-01 DIAGNOSIS — Z96649 Presence of unspecified artificial hip joint: Secondary | ICD-10-CM | POA: Diagnosis not present

## 2021-05-01 DIAGNOSIS — M1612 Unilateral primary osteoarthritis, left hip: Secondary | ICD-10-CM | POA: Diagnosis not present

## 2021-05-01 DIAGNOSIS — I509 Heart failure, unspecified: Secondary | ICD-10-CM | POA: Diagnosis not present

## 2021-05-01 DIAGNOSIS — I130 Hypertensive heart and chronic kidney disease with heart failure and stage 1 through stage 4 chronic kidney disease, or unspecified chronic kidney disease: Secondary | ICD-10-CM | POA: Diagnosis not present

## 2021-05-01 DIAGNOSIS — Z7982 Long term (current) use of aspirin: Secondary | ICD-10-CM | POA: Diagnosis not present

## 2021-05-01 DIAGNOSIS — J45909 Unspecified asthma, uncomplicated: Secondary | ICD-10-CM | POA: Diagnosis not present

## 2021-05-01 DIAGNOSIS — N189 Chronic kidney disease, unspecified: Secondary | ICD-10-CM | POA: Diagnosis not present

## 2021-05-01 DIAGNOSIS — R531 Weakness: Secondary | ICD-10-CM | POA: Diagnosis not present

## 2021-05-01 DIAGNOSIS — Z20822 Contact with and (suspected) exposure to covid-19: Secondary | ICD-10-CM | POA: Diagnosis not present

## 2021-05-01 DIAGNOSIS — Z7984 Long term (current) use of oral hypoglycemic drugs: Secondary | ICD-10-CM | POA: Diagnosis not present

## 2021-05-01 LAB — BASIC METABOLIC PANEL
Anion gap: 7 (ref 5–15)
BUN: 31 mg/dL — ABNORMAL HIGH (ref 8–23)
CO2: 22 mmol/L (ref 22–32)
Calcium: 8.3 mg/dL — ABNORMAL LOW (ref 8.9–10.3)
Chloride: 106 mmol/L (ref 98–111)
Creatinine, Ser: 1.48 mg/dL — ABNORMAL HIGH (ref 0.44–1.00)
GFR, Estimated: 40 mL/min — ABNORMAL LOW (ref >60)
Glucose, Bld: 251 mg/dL — ABNORMAL HIGH (ref 70–99)
Potassium: 4.8 mmol/L (ref 3.5–5.1)
Sodium: 135 mmol/L (ref 135–145)

## 2021-05-01 LAB — CBC
HCT: 26 % — ABNORMAL LOW (ref 36.0–46.0)
Hemoglobin: 8.5 g/dL — ABNORMAL LOW (ref 12.0–15.0)
MCH: 34.8 pg — ABNORMAL HIGH (ref 26.0–34.0)
MCHC: 32.7 g/dL (ref 30.0–36.0)
MCV: 106.6 fL — ABNORMAL HIGH (ref 80.0–100.0)
Platelets: 74 10*3/uL — ABNORMAL LOW (ref 150–400)
RBC: 2.44 MIL/uL — ABNORMAL LOW (ref 3.87–5.11)
RDW: 13.1 % (ref 11.5–15.5)
WBC: 4.7 10*3/uL (ref 4.0–10.5)
nRBC: 0 % (ref 0.0–0.2)

## 2021-05-01 LAB — GLUCOSE, CAPILLARY
Glucose-Capillary: 155 mg/dL — ABNORMAL HIGH (ref 70–99)
Glucose-Capillary: 161 mg/dL — ABNORMAL HIGH (ref 70–99)
Glucose-Capillary: 185 mg/dL — ABNORMAL HIGH (ref 70–99)

## 2021-05-01 MED ORDER — INSULIN ASPART 100 UNIT/ML IJ SOLN
0.0000 [IU] | Freq: Three times a day (TID) | INTRAMUSCULAR | Status: DC
  Administered 2021-05-01 (×2): 3 [IU] via SUBCUTANEOUS

## 2021-05-01 MED ORDER — ASPIRIN 81 MG PO CHEW
81.0000 mg | CHEWABLE_TABLET | Freq: Two times a day (BID) | ORAL | Status: DC
  Administered 2021-05-01: 81 mg via ORAL
  Filled 2021-05-01: qty 1

## 2021-05-01 MED ORDER — POLYETHYLENE GLYCOL 3350 17 G PO PACK: 17.0000 g | PACK | Freq: Every day | ORAL | 0 refills | Status: AC | PRN

## 2021-05-01 MED ORDER — TRANEXAMIC ACID 650 MG PO TABS: 1950.0000 mg | ORAL_TABLET | Freq: Every day | ORAL | 0 refills | Status: AC

## 2021-05-01 MED ORDER — TRANEXAMIC ACID 650 MG PO TABS
1950.0000 mg | ORAL_TABLET | Freq: Once | ORAL | Status: AC
  Administered 2021-05-01: 1950 mg via ORAL
  Filled 2021-05-01: qty 3

## 2021-05-01 MED ORDER — INSULIN ASPART 100 UNIT/ML IJ SOLN: 0.0000 [IU] | Freq: Every day | INTRAMUSCULAR | Status: DC

## 2021-05-01 MED ORDER — ASPIRIN 81 MG PO CHEW: 81.0000 mg | CHEWABLE_TABLET | Freq: Two times a day (BID) | ORAL | 0 refills | Status: AC

## 2021-05-01 MED ORDER — METHOCARBAMOL 500 MG PO TABS: 500.0000 mg | ORAL_TABLET | Freq: Four times a day (QID) | ORAL | 0 refills | Status: AC | PRN

## 2021-05-01 MED ORDER — RIVAROXABAN 10 MG PO TABS: 10.0000 mg | ORAL_TABLET | Freq: Every day | ORAL | Status: DC

## 2021-05-01 MED ORDER — OXYCODONE HCL 5 MG PO TABS: 5.0000 mg | ORAL_TABLET | Freq: Four times a day (QID) | ORAL | 0 refills | Status: DC | PRN

## 2021-05-01 NOTE — TOC Transition Note (Addendum)
Transition of Care [facility]) - CM/SW Discharge Note   Patient Details  Name: Gabriella Leon MRN: [id number] Date of Birth: July 21, 1956  Transition of Care [facility]) CM/SW Contact:  Lennart Pall, LCSW Phone Number: 05/01/2021, 9:28 AM   Clinical Narrative:    Met briefly with pt who confirms she is to get a 3n1 via [facility].  Plan HEP.  No TOC needs.  1500 Addendum: Pt not reporting that she does not have a rolling walker. Have placed referral with [facility] for delivery to hospital room.  Final next level of care: Home/Self Care Barriers to Discharge: No Barriers Identified   Patient Goals and CMS Choice Patient states their goals for this hospitalization and ongoing recovery are:: return home      Discharge Placement                       Discharge Plan and Services                DME Arranged: 3-N-1 DME Agency: [facility]                  Social Determinants of Health (SDOH) Interventions     Readmission Risk Interventions No flowsheet data found.

## 2021-05-01 NOTE — Discharge Instructions (Signed)

## 2021-05-01 NOTE — Plan of Care (Signed)
Patient discharged home in stable condition 

## 2021-05-01 NOTE — Progress Notes (Signed)
Subjective: 1 Day Post-Op Procedure(s) (LRB): TOTAL HIP ARTHROPLASTY ANTERIOR APPROACH (Left) Patient reports pain as mild.   Patient seen in rounds for Dr. Alvan Dame. Patient is well, and has had no acute complaints or problems. She did not get up with PT yet. Voiding without difficulty. Family at the bedside with her this AM.  We will start therapy today.   Objective: Vital signs in last 24 hours: Temp:  [97.3 F (36.3 C)-98.8 F (37.1 C)] 98.3 F (36.8 C) (05/11 0450) Pulse Rate:  [48-65] 64 (05/11 0450) Resp:  [10-16] 16 (05/11 0450) BP: (104-181)/(64-85) 143/73 (05/11 0450) SpO2:  [100 %] 100 % (05/11 0450) Weight:  [76.7 kg] 76.7 kg (05/10 1156)  Intake/Output from previous day:  Intake/Output Summary (Last 24 hours) at 05/01/2021 0752 Last data filed at 05/01/2021 0600 Gross per 24 hour  Intake 3316.75 ml  Output 500 ml  Net 2816.75 ml     Intake/Output this shift: No intake/output data recorded.  Labs: Recent Labs    05/01/21 0329  HGB 8.5*   Recent Labs    05/01/21 0329  WBC 4.7  RBC 2.44*  HCT 26.0*  PLT 74*   Recent Labs    05/01/21 0329  NA 135  K 4.8  CL 106  CO2 22  BUN 31*  CREATININE 1.48*  GLUCOSE 251*  CALCIUM 8.3*   No results for input(s): LABPT, INR in the last 72 hours.  Exam: General - Patient is Alert and Oriented Extremity - Neurologically intact Sensation intact distally Intact pulses distally Dorsiflexion/Plantar flexion intact Dressing - dressing C/D/I Motor Function - intact, moving foot and toes well on exam.   Past Medical History:  Diagnosis Date  . Acid reflux disease with ulcer   . Anxiety   . Arthritis   . Asthma   . Blood dyscrasia    thrombocytopenia-next tx injection 09/25/15  . Blood transfusion   . CHF (congestive heart failure) ([place]) 2021  . Chronic kidney disease   . Cirrhosis ([place])   . DDD (degenerative disc disease), lumbar   . Depression   . Diabetes mellitus   . Dyspnea   . Esophageal  varices ([place]) 09/18/2014  . Folate deficiency 03/28/2014   Noted on 03/23/2014.  Folate 1 mg prescribed.  . Gallstones   . GERD (gastroesophageal reflux disease)   . Headache    migraines  . Helicobacter pylori ab+    July 2010, s/p treatment  . Hepatitis B antibody positive   . Hepatitis C    and B positive antibody  . History of alcoholism ([place])   . History of kidney stones   . Hypertension   . Left leg pain   . Liver cirrhosis ([place])   . Low back pain   . Other pancytopenia ([place]) 10/27/2013  . Ulcer    ?    Assessment/Plan: 1 Day Post-Op Procedure(s) (LRB): TOTAL HIP ARTHROPLASTY ANTERIOR APPROACH (Left) Active Problems:   S/P left total hip arthroplasty  Estimated body mass index is 28.12 kg/m as calculated from the following:   Height as of this encounter: 5\' 5"  (1.651 m).   Weight as of this encounter: 76.7 kg. Advance diet Up with therapy D/C IV fluids  DVT Prophylaxis - Xarelto Weight bearing as tolerated.  Hemoglobin 12.1>>8.5 this AM secondary to ABLA. Will give one dose of oral TXA today.  Plan is to go Home after hospital stay. Plan to get up with PT today to work on gait training. If she  is progressing well, plan for discharge home today. Follow up in the office in 2 weeks.   Griffith Citron, PA-C Orthopedic Surgery [phone] 05/01/2021, 7:52 AM

## 2021-05-01 NOTE — Progress Notes (Signed)
   05/01/21 1200  PT Visit Information  Last PT Received On 05/01/21  Reviewed HEP, bed mobility with gait belt as leg lifter.  Pt feels ready to d/c with family assist   Assistance Needed +1  History of Present Illness s/p L DA THA . PMH: ACDF C3-4, Hep C, HTN  Subjective Data  Patient Stated Goal home soon  Precautions  Precautions Fall  Restrictions  Weight Bearing Restrictions No  LLE Weight Bearing WBAT  Pain Assessment  Pain Assessment Faces  Pain Score 4  Pain Location L hip  Pain Descriptors / Indicators Discomfort;Grimacing  Pain Intervention(s) Limited activity within patient's tolerance;Monitored during session;Premedicated before session  Cognition  Arousal/Alertness Awake/alert  Behavior During Therapy WFL for tasks assessed/performed  Overall Cognitive Status Within Functional Limits for tasks assessed  Bed Mobility  Overal bed mobility Needs Assistance  Bed Mobility Sit to Supine  Sit to supine Min guard  General bed mobility comments cues to use  Transfers  Equipment used Rolling walker (2 wheeled)  Transfers Sit to/from [facility]  Sit to Stand Min guard  Stand pivot transfers Min guard  General transfer comment cues for hand placement  Total Joint Exercises  Ankle Circles/Pumps AROM;10 reps  Quad Sets AROM;Both;10 reps  Heel Slides AROM;10 reps;AAROM;Left  Hip ABduction/ADduction AROM;10 reps;Left  PT - End of Session  Activity Tolerance Patient tolerated treatment well  Patient left in bed;with call bell/phone within reach;with bed alarm set;with family/visitor present   PT - Assessment/Plan  PT Plan Current plan remains appropriate  PT Visit Diagnosis Other abnormalities of gait and mobility (R26.89);Difficulty in walking, not elsewhere classified (R26.2)  PT Frequency (ACUTE ONLY) 7X/week  Follow Up Recommendations Follow surgeon's recommendation for DC plan and follow-up therapies  PT equipment None recommended by PT  AM-PAC PT "6  Clicks" Mobility Outcome Measure (Version 2)  Help needed turning from your back to your side while in a flat bed without using bedrails? 3  Help needed moving from lying on your back to sitting on the side of a flat bed without using bedrails? 3  Help needed moving to and from a bed to a chair (including a wheelchair)? 3  Help needed standing up from a chair using your arms (e.g., wheelchair or bedside chair)? 3  Help needed to walk in hospital room? 3  Help needed climbing 3-5 steps with a railing?  3  6 Click Score 18  Consider Recommendation of Discharge To: Home with [facility]  PT Goal Progression  Progress towards PT goals Progressing toward goals  Acute Rehab PT Goals  PT Goal Formulation With patient  Time For Goal Achievement 05/08/21  Potential to Achieve Goals Good  PT Time Calculation  PT Start Time (ACUTE ONLY) 1210  PT Stop Time (ACUTE ONLY) 1227  PT Time Calculation (min) (ACUTE ONLY) 17 min  PT General Charges  $$ ACUTE PT VISIT 1 Visit  PT Treatments  $Therapeutic Exercise 8-22 mins

## 2021-05-01 NOTE — Evaluation (Signed)
Physical Therapy Evaluation Patient Details Name: Gabriella Leon MRN: [id number] DOB: 10-Nov-1956 Today's Date: 05/01/2021   History of Present Illness  s/p L DA THA . PMH: ACDF C3-4, Hep C, HTN  Clinical Impression  Pt is s/p THA resulting in the deficits listed below (see PT Problem List).  Pt will benefit from skilled PT to increase their independence and safety with mobility to allow discharge to the venue listed below.      Follow Up Recommendations Follow surgeon's recommendation for DC plan and follow-up therapies    Equipment Recommendations  None recommended by PT    Recommendations for Other Services       Precautions / Restrictions Precautions Precautions: Fall Restrictions Weight Bearing Restrictions: No LLE Weight Bearing: Weight bearing as tolerated      Mobility  Bed Mobility Overal bed mobility: Needs Assistance             General bed mobility comments: pt up to bathroom on her own on PT arrival    Transfers Overall transfer level: Needs assistance Equipment used: Rolling walker (2 wheeled) Transfers: Sit to/from Stand Sit to Stand: Min guard         General transfer comment: cues for hand placement  Ambulation/Gait Ambulation/Gait assistance: Min guard Gait Distance (Feet): 65 Feet   Gait Pattern/deviations: Step-to pattern;Decreased step length - right;Decreased stance time - left Gait velocity: decr   General Gait Details: cues for sequence and safety  Stairs            Wheelchair Mobility    Modified Rankin (Stroke Patients Only)       Balance                                             Pertinent Vitals/Pain Pain Assessment: Faces Faces Pain Scale: Hurts little more Pain Location: L hip Pain Descriptors / Indicators: Discomfort;Grimacing Pain Intervention(s): Premedicated before session;Monitored during session;Limited activity within patient's tolerance;Repositioned    Home Living  Family/patient expects to be discharged to:: Private residence Living Arrangements: Alone Available Help at Discharge: Family;Available 24 hours/day   Home Access: Ramped entrance     Home Layout: One level Home Equipment: Walker - 2 wheels      Prior Function Level of Independence: Independent               Hand Dominance        Extremity/Trunk Assessment   Upper Extremity Assessment Upper Extremity Assessment: Overall WFL for tasks assessed    Lower Extremity Assessment Lower Extremity Assessment: LLE deficits/detail LLE Deficits / Details: ankle WFL, knee and hip grossly 2+/5       Communication   Communication: No difficulties  Cognition Arousal/Alertness: Awake/alert Behavior During Therapy: WFL for tasks assessed/performed Overall Cognitive Status: Within Functional Limits for tasks assessed                                        General Comments      Exercises     Assessment/Plan    PT Assessment Patient needs continued PT services  PT Problem List Decreased strength;Decreased activity tolerance;Decreased balance;Decreased knowledge of use of DME;Decreased range of motion;Decreased mobility;Pain       PT Treatment Interventions Functional mobility training;Gait training;Therapeutic exercise;Patient/family education;DME instruction;Therapeutic activities  PT Goals (Current goals can be found in the Care Plan section)  Acute Rehab PT Goals Patient Stated Goal: home soon PT Goal Formulation: With patient Time For Goal Achievement: 05/08/21 Potential to Achieve Goals: Good    Frequency 7X/week   Barriers to discharge        Co-evaluation               AM-PAC PT "6 Clicks" Mobility  Outcome Measure Help needed turning from your back to your side while in a flat bed without using bedrails?: A Little Help needed moving from lying on your back to sitting on the side of a flat bed without using bedrails?: A  Little Help needed moving to and from a bed to a chair (including a wheelchair)?: A Little Help needed standing up from a chair using your arms (e.g., wheelchair or bedside chair)?: A Little Help needed to walk in hospital room?: A Little Help needed climbing 3-5 steps with a railing? : A Little 6 Click Score: 18    End of Session Equipment Utilized During Treatment: Gait belt Activity Tolerance: Patient tolerated treatment well Patient left: in chair;with call bell/phone within reach;with chair alarm set   PT Visit Diagnosis: Other abnormalities of gait and mobility (R26.89);Difficulty in walking, not elsewhere classified (R26.2)    Time: 8413-2440 PT Time Calculation (min) (ACUTE ONLY): 12 min   Charges:   PT Evaluation $PT Eval Low Complexity: [place], PT  Acute Rehab Dept ([place]) [phone] Pager [phone]  05/01/2021   [facility] 05/01/2021, 11:42 AM

## 2021-05-02 ENCOUNTER — Encounter (HOSPITAL_COMMUNITY): Payer: Self-pay | Admitting: Orthopedic Surgery

## 2021-05-06 NOTE — Discharge Summary (Signed)
Physician Discharge Summary   Patient ID: Gabriella Leon MRN: [id number] DOB/AGE: Sep 02, 1956 66 y.o.  Admit date: 04/30/2021 Discharge date: 05/01/2021  Primary Diagnosis: Left hip osteoarthritis  Admission Diagnoses:  Past Medical History:  Diagnosis Date  . Acid reflux disease with ulcer   . Anxiety   . Arthritis   . Asthma   . Blood dyscrasia    thrombocytopenia-next tx injection 09/25/15  . Blood transfusion   . CHF (congestive heart failure) (HCC) 2021  . Chronic kidney disease   . Cirrhosis (HCC)   . DDD (degenerative disc disease), lumbar   . Depression   . Diabetes mellitus   . Dyspnea   . Esophageal varices (HCC) 09/18/2014  . Folate deficiency 03/28/2014   Noted on 03/23/2014.  Folate 1 mg prescribed.  . Gallstones   . GERD (gastroesophageal reflux disease)   . Headache    migraines  . Helicobacter pylori ab+    July 2010, s/p treatment  . Hepatitis B antibody positive   . Hepatitis C    and B positive antibody  . History of alcoholism (HCC)   . History of kidney stones   . Hypertension   . Left leg pain   . Liver cirrhosis (HCC)   . Low back pain   . Other pancytopenia (HCC) 10/27/2013  . Ulcer    ?   Discharge Diagnoses:   Active Problems:   S/P left total hip arthroplasty  Estimated body mass index is 28.12 kg/m as calculated from the following:   Height as of this encounter: 5\' 5"  (1.651 m).   Weight as of this encounter: 76.7 kg.  Procedure:  Procedure(s) (LRB): TOTAL HIP ARTHROPLASTY ANTERIOR APPROACH (Left)   Consults: None  HPI: Gabriella Leon is a 65 y.o. female who had   presented to office for evaluation of left hip pain.  Radiographs revealed   progressive degenerative changes with bone-on-bone   articulation of the  hip joint, including subchondral cystic changes and osteophytes.  The patient had painful limited range of   motion significantly affecting their overall quality of life and function.  The patient was failing  to    respond to conservative measures including medications and/or injections and activity modification and at this point was ready   to proceed with more definitive measures.  Consent was obtained for   benefit of pain relief.  Specific risks of infection, DVT, component   failure, dislocation, neurovascular injury, and need for revision surgery were reviewed in the office as well discussion of   the anterior versus posterior approach were reviewed.  Laboratory Data: Admission on 04/30/2021, Discharged on 05/01/2021  Component Date Value Ref Range Status  . SARS Coronavirus 2 by RT PCR 04/30/2021 NEGATIVE  NEGATIVE Final   Comment: (NOTE) SARS-CoV-2 target nucleic acids are NOT DETECTED.  The SARS-CoV-2 RNA is generally detectable in upper respiratory specimens during the acute phase of infection. The lowest concentration of SARS-CoV-2 viral copies this assay can detect is 138 copies/mL. A negative result does not preclude SARS-Cov-2 infection and should not be used as the sole basis for treatment or other patient management decisions. A negative result may occur with  improper specimen collection/handling, submission of specimen other than nasopharyngeal swab, presence of viral mutation(s) within the areas targeted by this assay, and inadequate number of viral copies(<138 copies/mL). A negative result must be combined with clinical observations, patient history, and epidemiological information. The expected result is Negative.  Fact Sheet for Patients:  BloggerCourse.com  Fact Sheet for Healthcare Providers:  IncredibleEmployment.be  This test is no                          t yet approved or cleared by the [place] FDA and  has been authorized for detection and/or diagnosis of SARS-CoV-2 by FDA under an Emergency Use Authorization (EUA). This EUA will remain  in effect (meaning this test can be used) for the duration of  the COVID-19 declaration under Section 564(b)(1) of the Act, 21 U.S.C.section 360bbb-3(b)(1), unless the authorization is terminated  or revoked sooner.      . Influenza A by PCR 04/30/2021 NEGATIVE  NEGATIVE Final  . Influenza B by PCR 04/30/2021 NEGATIVE  NEGATIVE Final   Comment: (NOTE) The Xpert Xpress SARS-CoV-2/FLU/RSV plus assay is intended as an aid in the diagnosis of influenza from Nasopharyngeal swab specimens and should not be used as a sole basis for treatment. Nasal washings and aspirates are unacceptable for Xpert Xpress SARS-CoV-2/FLU/RSV testing.  Fact Sheet for Patients: EntrepreneurPulse.com.au  Fact Sheet for Healthcare Providers: IncredibleEmployment.be  This test is not yet approved or cleared by the [place] FDA and has been authorized for detection and/or diagnosis of SARS-CoV-2 by FDA under an Emergency Use Authorization (EUA). This EUA will remain in effect (meaning this test can be used) for the duration of the COVID-19 declaration under Section 564(b)(1) of the Act, 21 U.S.C. section 360bbb-3(b)(1), unless the authorization is terminated or revoked.  Performed at [facility], [place] [address] [place], [place] [address]   . Glucose-Capillary 04/30/2021 127* 70 - 99 mg/dL Final   Glucose reference range applies only to samples taken after fasting for at least 8 hours.  . Glucose-Capillary 04/30/2021 163* 70 - 99 mg/dL Final   Glucose reference range applies only to samples taken after fasting for at least 8 hours.  . WBC 05/01/2021 4.7  4.0 - 10.5 K/uL Final  . RBC 05/01/2021 2.44* 3.87 - 5.11 MIL/uL Final  . Hemoglobin 05/01/2021 8.5* 12.0 - 15.0 g/dL Final  . HCT 05/01/2021 26.0* 36.0 - 46.0 % Final  . MCV 05/01/2021 106.6* 80.0 - 100.0 fL Final  . MCH 05/01/2021 34.8* 26.0 - 34.0 pg Final  . MCHC 05/01/2021 32.7  30.0 - 36.0 g/dL Final  . RDW 05/01/2021 13.1  11.5 - 15.5 % Final  .  Platelets 05/01/2021 74* 150 - 400 K/uL Final   Comment: SPECIMEN CHECKED FOR CLOTS Immature Platelet Fraction may be clinically indicated, consider ordering this additional test [id number] CONSISTENT WITH PREVIOUS RESULT REPEATED TO VERIFY   . nRBC 05/01/2021 0.0  0.0 - 0.2 % Final   Performed at [place] [address], [place], [place] [address]  . Sodium 05/01/2021 135  135 - 145 mmol/L Final  . Potassium 05/01/2021 4.8  3.5 - 5.1 mmol/L Final  . Chloride 05/01/2021 106  98 - 111 mmol/L Final  . CO2 05/01/2021 22  22 - 32 mmol/L Final  . Glucose, Bld 05/01/2021 251* 70 - 99 mg/dL Final   Glucose reference range applies only to samples taken after fasting for at least 8 hours.  . BUN 05/01/2021 31* 8 - 23 mg/dL Final  . Creatinine, Ser 05/01/2021 1.48* 0.44 - 1.00 mg/dL Final  . Calcium 05/01/2021 8.3* 8.9 - 10.3 mg/dL Final  . GFR, Estimated 05/01/2021 40* >60 mL/min Final   Comment: (NOTE) Calculated using the CKD-EPI Creatinine Equation (2021)   .  Anion gap 05/01/2021 7  5 - 15 Final   Performed at [facility], [address] [place], [place] [address]  . Glucose-Capillary 05/01/2021 185* 70 - 99 mg/dL Final   Glucose reference range applies only to samples taken after fasting for at least 8 hours.  . Glucose-Capillary 05/01/2021 161* 70 - 99 mg/dL Final   Glucose reference range applies only to samples taken after fasting for at least 8 hours.  . Glucose-Capillary 05/01/2021 155* 70 - 99 mg/dL Final   Glucose reference range applies only to samples taken after fasting for at least 8 hours.  Hospital Outpatient Visit on 04/22/2021  Component Date Value Ref Range Status  . WBC 04/22/2021 6.1  4.0 - 10.5 K/uL Final  . RBC 04/22/2021 3.45* 3.87 - 5.11 MIL/uL Final  . Hemoglobin 04/22/2021 12.1  12.0 - 15.0 g/dL Final  . HCT 60/45/409805/01/2021 35.5* 36.0 - 46.0 % Final  . MCV 04/22/2021 102.9* 80.0 - 100.0 fL Final  . MCH 04/22/2021 35.1*  26.0 - 34.0 pg Final  . MCHC 04/22/2021 34.1  30.0 - 36.0 g/dL Final  . RDW 11/91/478205/01/2021 13.2  11.5 - 15.5 % Final  . Platelets 04/22/2021 101* 150 - 400 K/uL Final   Comment: SPECIMEN CHECKED FOR CLOTS Immature Platelet Fraction may be clinically indicated, consider ordering this additional test [id number] REPEATED TO VERIFY PLATELET COUNT CONFIRMED BY SMEAR   . nRBC 04/22/2021 0.0  0.0 - 0.2 % Final   Performed at [facility], [address] [place], [place] [address]  . Sodium 04/22/2021 134* 135 - 145 mmol/L Final  . Potassium 04/22/2021 4.1  3.5 - 5.1 mmol/L Final  . Chloride 04/22/2021 100  98 - 111 mmol/L Final  . CO2 04/22/2021 24  22 - 32 mmol/L Final  . Glucose, Bld 04/22/2021 355* 70 - 99 mg/dL Final   Glucose reference range applies only to samples taken after fasting for at least 8 hours.  . BUN 04/22/2021 26* 8 - 23 mg/dL Final  . Creatinine, Ser 04/22/2021 1.30* 0.44 - 1.00 mg/dL Final  . Calcium 69/62/952805/01/2021 9.3  8.9 - 10.3 mg/dL Final  . Total Protein 04/22/2021 6.6  6.5 - 8.1 g/dL Final  . Albumin 41/32/440105/01/2021 3.2* 3.5 - 5.0 g/dL Final  . AST 02/72/536605/01/2021 33  15 - 41 U/L Final  . ALT 04/22/2021 28  0 - 44 U/L Final  . Alkaline Phosphatase 04/22/2021 175* 38 - 126 U/L Final  . Total Bilirubin 04/22/2021 1.2  0.3 - 1.2 mg/dL Final  . GFR, Estimated 04/22/2021 46* >60 mL/min Final   Comment: (NOTE) Calculated using the CKD-EPI Creatinine Equation (2021)   . Anion gap 04/22/2021 10  5 - 15 Final   Performed at [facility], [address] [place], [place] [address]  . Prothrombin Time 04/22/2021 15.1  11.4 - 15.2 seconds Final  . INR 04/22/2021 1.2  0.8 - 1.2 Final   Comment: (NOTE) INR goal varies based on device and disease states. Performed at [facility], [address] [place], [place] [address]   . aPTT 04/22/2021 30  24 - 36 seconds Final   Performed at [facility], [address] [place], [place] [address]  . ABO/RH(D) 04/22/2021 O NEG   Final  . Antibody Screen 04/22/2021 NEG   Final  . Sample Expiration 04/22/2021 05/03/2021,2359   Final  . Extend sample reason 04/22/2021    Final  Value:NO TRANSFUSIONS OR PREGNANCY IN THE PAST 3 MONTHS Performed at [place] [address] [place], [place] [address]   . MRSA, PCR 04/22/2021 POSITIVE* NEGATIVE Final   Comment: RESULT CALLED TO, READ BACK BY AND VERIFIED WITH: PHILIPS,K. RN AT [id number] 04/23/21 MULLINS,T   . Staphylococcus aureus 04/22/2021 POSITIVE* NEGATIVE Final   Comment: RESULT CALLED TO, READ BACK BY AND VERIFIED WITH: PHILIPS,K. RN AT [id number] 04/23/21 MULLINS,T (NOTE) The Xpert SA Assay (FDA approved for NASAL specimens in patients 27 years of age and older), is one component of a comprehensive surveillance program. It is not intended to diagnose infection nor to guide or monitor treatment. Performed at [facility], [place] [address], [place],  [address]   . Glucose-Capillary 04/22/2021 352* 70 - 99 mg/dL Final   Glucose reference range applies only to samples taken after fasting for at least 8 hours.  . Comment 1 04/22/2021 Notify RN   Final     X-Rays:DG Pelvis Portable  Result Date: 04/30/2021 CLINICAL DATA:  Postop left hip replacement. EXAM: PORTABLE PELVIS 1-2 VIEWS COMPARISON:  None. FINDINGS: Left hip arthroplasty in expected alignment. No periprosthetic lucency or fracture. Recent postsurgical change includes air and edema in the joint spaces and soft tissues. Remainder of the included bony pelvis is intact. IMPRESSION: Left hip arthroplasty without immediate postoperative complication. Electronically Signed   By: Keith Rake M.D.   On: 04/30/2021 17:49   DG C-Arm 1-60 Min-No Report  Result Date: 04/30/2021 CLINICAL DATA:  Left total hip replacement. EXAM: OPERATIVE LEFT HIP (WITH PELVIS IF PERFORMED) TECHNIQUE: Fluoroscopic spot  image(s) were submitted for interpretation post-operatively. COMPARISON:  None. FINDINGS: Four fluoroscopic spot views of the pelvis and left hip obtained in the operating room during left hip arthroplasty. Total fluoroscopy time 13 seconds. IMPRESSION: Procedural fluoroscopy for left hip arthroplasty. Electronically Signed   By: Keith Rake M.D.   On: 04/30/2021 18:29   DG HIP OPERATIVE UNILAT W OR W/O PELVIS LEFT  Result Date: 04/30/2021 CLINICAL DATA:  Left total hip replacement. EXAM: OPERATIVE LEFT HIP (WITH PELVIS IF PERFORMED) TECHNIQUE: Fluoroscopic spot image(s) were submitted for interpretation post-operatively. COMPARISON:  None. FINDINGS: Four fluoroscopic spot views of the pelvis and left hip obtained in the operating room during left hip arthroplasty. Total fluoroscopy time 13 seconds. IMPRESSION: Procedural fluoroscopy for left hip arthroplasty. Electronically Signed   By: Keith Rake M.D.   On: 04/30/2021 18:29    EKG: Orders placed or performed during the hospital encounter of 07/26/20  . EKG  . EKG     Hospital Course: Gabriella Leon is a 65 y.o. who was admitted to [facility]. They were brought to the operating room on 04/30/2021 and underwent Procedure(s): [place].  Patient tolerated the procedure well and was later transferred to the recovery room and then to the orthopaedic floor for postoperative care. They were given PO and IV analgesics for pain control following their surgery. They were given 24 hours of postoperative antibiotics of  Anti-infectives (From admission, onward)   Start     Dose/Rate Route Frequency Ordered Stop   04/30/21 2100  ceFAZolin (ANCEF) IVPB 2g/100 mL premix        2 g 200 mL/hr over 30 Minutes Intravenous Every 6 hours 04/30/21 1817 05/01/21 0400   04/30/21 1145  ceFAZolin (ANCEF) IVPB 2g/100 mL premix        2 g 200 mL/hr over 30 Minutes Intravenous On call  to O.R. 04/30/21 1134 04/30/21  1456   04/30/21 1145  vancomycin (VANCOCIN) IVPB 1000 mg/200 mL premix        1,000 mg 200 mL/hr over 60 Minutes Intravenous On call to O.R. 04/30/21 1135 04/30/21 1519   04/30/21 1145  vancomycin (VANCOCIN) IVPB 1000 mg/200 mL premix  Status:  Discontinued        1,000 mg 200 mL/hr over 60 Minutes Intravenous  Once 04/30/21 1135 04/30/21 1143     and started on DVT prophylaxis in the form of Aspirin.   PT and OT were ordered for total joint protocol. Discharge planning consulted to help with postop disposition and equipment needs.  Patient had a good night on the evening of surgery. They started to get up OOB with therapy on POD #1. Pt was seen during rounds and was ready to go home pending progress with therapy.She worked with therapy on POD #1 and was meeting her goals. Pt was discharged to home later that day in stable condition.  Diet: Regular diet Activity: WBAT Follow-up: in 2 weeks Disposition: Home Discharged Condition: good   Discharge Instructions    Call MD / Call 911   Complete by: As directed    If you experience chest pain or shortness of breath, CALL 911 and be transported to the hospital emergency room.  If you develope a fever above 101 F, pus (white drainage) or increased drainage or redness at the wound, or calf pain, call your surgeon's office.   Change dressing   Complete by: As directed    Maintain surgical dressing until follow up in the clinic. If the edges start to pull up, may reinforce with tape. If the dressing is no longer working, may remove and cover with gauze and tape, but must keep the area dry and clean.  Call with any questions or concerns.   Constipation Prevention   Complete by: As directed    Drink plenty of fluids.  Prune juice may be helpful.  You may use a stool softener, such as Colace (over the counter) 100 mg twice a day.  Use MiraLax (over the counter) for constipation as needed.   Diet - low sodium heart healthy   Complete by: As directed     Increase activity slowly as tolerated   Complete by: As directed    Weight bearing as tolerated with assist device (walker, cane, etc) as directed, use it as long as suggested by your surgeon or therapist, typically at least 4-6 weeks.   Post-operative opioid taper instructions:   Complete by: As directed    POST-OPERATIVE OPIOID TAPER INSTRUCTIONS: It is important to wean off of your opioid medication as soon as possible. If you do not need pain medication after your surgery it is ok to stop day one. Opioids include: Codeine, Hydrocodone(Norco, Vicodin), Oxycodone(Percocet, oxycontin) and hydromorphone amongst others.  Long term and even short term use of opiods can cause: Increased pain response Dependence Constipation Depression Respiratory depression And more.  Withdrawal symptoms can include Flu like symptoms Nausea, vomiting And more Techniques to manage these symptoms Hydrate well Eat regular healthy meals Stay active Use relaxation techniques(deep breathing, meditating, yoga) Do Not substitute Alcohol to help with tapering If you have been on opioids for less than two weeks and do not have pain than it is ok to stop all together.  Plan to wean off of opioids This plan should start within one week post op of your joint replacement. Maintain the same interval  or time between taking each dose and first decrease the dose.  Cut the total daily intake of opioids by one tablet each day Next start to increase the time between doses. The last dose that should be eliminated is the evening dose.      TED hose   Complete by: As directed    Use stockings (TED hose) for 2 weeks on both leg(s).  You may remove them at night for sleeping.     Allergies as of 05/01/2021   No Known Allergies     Medication List    STOP taking these medications   aspirin EC 81 MG tablet Replaced by: aspirin 81 MG chewable tablet   dexamethasone 4 MG tablet Commonly known as: DECADRON    diclofenac 75 MG EC tablet Commonly known as: VOLTAREN   meloxicam 15 MG tablet Commonly known as: MOBIC   naproxen sodium 220 MG tablet Commonly known as: ALEVE   ondansetron 4 MG tablet Commonly known as: Zofran     TAKE these medications   aspirin 81 MG chewable tablet Chew 1 tablet (81 mg total) by mouth 2 (two) times daily for 28 days. Replaces: aspirin EC 81 MG tablet   docusate sodium 100 MG capsule Commonly known as: Colace Take 1 capsule (100 mg total) by mouth daily as needed.   ergocalciferol 1.25 MG (50000 UT) capsule Commonly known as: VITAMIN D2 Take 1 capsule (50,000 Units total) by mouth once a week.   famotidine 20 MG tablet Commonly known as: PEPCID Take 20 mg by mouth 2 (two) times daily.   lisinopril-hydrochlorothiazide 10-12.5 MG tablet Commonly known as: ZESTORETIC Take 1 tablet by mouth daily.   metFORMIN 500 MG tablet Commonly known as: GLUCOPHAGE Take 500 mg by mouth daily with breakfast.   methocarbamol 500 MG tablet Commonly known as: ROBAXIN Take 1 tablet (500 mg total) by mouth every 6 (six) hours as needed for muscle spasms. What changed:   when to take this  reasons to take this   nadolol 40 MG tablet Commonly known as: CORGARD Take 40 mg by mouth daily.   oxyCODONE 5 MG immediate release tablet Commonly known as: Oxy IR/ROXICODONE Take 1-2 tablets (5-10 mg total) by mouth every 6 (six) hours as needed for severe pain (pain 7-10).   pantoprazole 40 MG tablet Commonly known as: PROTONIX Take 40 mg by mouth 2 (two) times daily.   polyethylene glycol 17 g packet Commonly known as: MIRALAX / GLYCOLAX Take 17 g by mouth daily as needed for mild constipation.   spironolactone 25 MG tablet Commonly known as: ALDACTONE TAKE 1 TABLET BY MOUTH DAILY   thiamine 100 MG tablet Commonly known as: Vitamin B-1 Take 100 mg by mouth daily.   tranexamic acid 650 MG Tabs tablet Commonly known as: LYSTEDA Take 3 tablets (1,950 mg  total) by mouth daily for 5 days.            Discharge Care Instructions  (From admission, onward)         Start     Ordered   05/01/21 0000  Change dressing       Comments: Maintain surgical dressing until follow up in the clinic. If the edges start to pull up, may reinforce with tape. If the dressing is no longer working, may remove and cover with gauze and tape, but must keep the area dry and clean.  Call with any questions or concerns.   05/01/21 1435  Follow-up Information    [name], MD. Go on 05/15/2021.   Specialty: Orthopedic Surgery Why: You are scheduled for first post op appointment on Wednesday May 25th at 10:30am. Contact information: [address] [place] [address] [phone]               Signed: Griffith Citron, PA-C Orthopedic Surgery 05/06/2021, 3:13 PM

## 2021-05-15 ENCOUNTER — Ambulatory Visit (HOSPITAL_COMMUNITY): Payer: Medicare HMO

## 2021-05-16 ENCOUNTER — Encounter (HOSPITAL_COMMUNITY): Payer: Self-pay

## 2021-05-16 NOTE — Progress Notes (Signed)
Patient did not arrive for LDCT appt. Unable to reach patient at this time for reschedule.

## 2021-05-21 ENCOUNTER — Encounter (HOSPITAL_COMMUNITY): Payer: Self-pay

## 2021-05-21 NOTE — Progress Notes (Signed)
Contacted patient regarding low-dose CT scan. Patient has been unable to make recent appts scheduled and requests that labs and CT scan be scheduled same day. LDCT scheduled for 07/02/2021 at 1500 per patient request.

## 2021-07-02 ENCOUNTER — Ambulatory Visit (HOSPITAL_COMMUNITY): Payer: Medicare HMO

## 2021-07-02 ENCOUNTER — Inpatient Hospital Stay (HOSPITAL_COMMUNITY): Payer: Medicare HMO | Attending: Hematology

## 2021-07-08 NOTE — Progress Notes (Deleted)
DISREGARD

## 2021-07-09 ENCOUNTER — Ambulatory Visit (HOSPITAL_COMMUNITY): Payer: Medicare HMO | Admitting: Physician Assistant

## 2021-07-19 ENCOUNTER — Ambulatory Visit (HOSPITAL_COMMUNITY)
Admission: RE | Admit: 2021-07-19 | Discharge: 2021-07-19 | Disposition: A | Discharge status: Home / Self Care | Payer: Medicare HMO | Source: Ambulatory Visit | Attending: Oncology | Admitting: Oncology

## 2021-07-19 ENCOUNTER — Other Ambulatory Visit: Payer: Self-pay

## 2021-07-19 DIAGNOSIS — Z122 Encounter for screening for malignant neoplasm of respiratory organs: Secondary | ICD-10-CM | POA: Insufficient documentation

## 2021-07-19 DIAGNOSIS — Z87891 Personal history of nicotine dependence: Secondary | ICD-10-CM | POA: Diagnosis not present

## 2021-07-19 DIAGNOSIS — F17210 Nicotine dependence, cigarettes, uncomplicated: Secondary | ICD-10-CM | POA: Diagnosis not present

## 2021-08-20 DIAGNOSIS — F1010 Alcohol abuse, uncomplicated: Secondary | ICD-10-CM | POA: Diagnosis not present

## 2021-08-20 DIAGNOSIS — Z6826 Body mass index (BMI) 26.0-26.9, adult: Secondary | ICD-10-CM | POA: Diagnosis not present

## 2021-08-20 DIAGNOSIS — I700 Atherosclerosis of aorta: Secondary | ICD-10-CM | POA: Diagnosis not present

## 2021-08-20 DIAGNOSIS — Z0000 Encounter for general adult medical examination without abnormal findings: Secondary | ICD-10-CM | POA: Diagnosis not present

## 2021-08-20 DIAGNOSIS — E1143 Type 2 diabetes mellitus with diabetic autonomic (poly)neuropathy: Secondary | ICD-10-CM | POA: Diagnosis not present

## 2021-08-20 DIAGNOSIS — I10 Essential (primary) hypertension: Secondary | ICD-10-CM | POA: Diagnosis not present

## 2021-08-20 DIAGNOSIS — M25552 Pain in left hip: Secondary | ICD-10-CM | POA: Diagnosis not present

## 2021-08-30 DIAGNOSIS — M25552 Pain in left hip: Secondary | ICD-10-CM | POA: Diagnosis not present

## 2021-08-30 DIAGNOSIS — E1143 Type 2 diabetes mellitus with diabetic autonomic (poly)neuropathy: Secondary | ICD-10-CM | POA: Diagnosis not present

## 2021-08-30 DIAGNOSIS — F1010 Alcohol abuse, uncomplicated: Secondary | ICD-10-CM | POA: Diagnosis not present

## 2021-08-30 DIAGNOSIS — I10 Essential (primary) hypertension: Secondary | ICD-10-CM | POA: Diagnosis not present

## 2021-08-30 DIAGNOSIS — Z0000 Encounter for general adult medical examination without abnormal findings: Secondary | ICD-10-CM | POA: Diagnosis not present

## 2021-08-30 DIAGNOSIS — I700 Atherosclerosis of aorta: Secondary | ICD-10-CM | POA: Diagnosis not present

## 2021-09-19 DIAGNOSIS — N281 Cyst of kidney, acquired: Secondary | ICD-10-CM | POA: Diagnosis not present

## 2021-09-19 DIAGNOSIS — R1084 Generalized abdominal pain: Secondary | ICD-10-CM | POA: Diagnosis not present

## 2021-10-16 DIAGNOSIS — K7689 Other specified diseases of liver: Secondary | ICD-10-CM | POA: Diagnosis not present

## 2021-10-16 DIAGNOSIS — Z9049 Acquired absence of other specified parts of digestive tract: Secondary | ICD-10-CM | POA: Diagnosis not present

## 2021-10-25 DIAGNOSIS — R188 Other ascites: Secondary | ICD-10-CM | POA: Diagnosis not present

## 2021-10-25 DIAGNOSIS — M5136 Other intervertebral disc degeneration, lumbar region: Secondary | ICD-10-CM | POA: Diagnosis not present

## 2021-10-25 DIAGNOSIS — I7090 Unspecified atherosclerosis: Secondary | ICD-10-CM | POA: Diagnosis not present

## 2021-10-25 DIAGNOSIS — K766 Portal hypertension: Secondary | ICD-10-CM | POA: Diagnosis not present

## 2021-10-25 DIAGNOSIS — M47816 Spondylosis without myelopathy or radiculopathy, lumbar region: Secondary | ICD-10-CM | POA: Diagnosis not present

## 2021-10-25 DIAGNOSIS — R0602 Shortness of breath: Secondary | ICD-10-CM | POA: Diagnosis not present

## 2021-10-25 DIAGNOSIS — I8500 Esophageal varices without bleeding: Secondary | ICD-10-CM | POA: Diagnosis not present

## 2021-10-25 DIAGNOSIS — K7460 Unspecified cirrhosis of liver: Secondary | ICD-10-CM | POA: Diagnosis not present

## 2021-10-25 DIAGNOSIS — K7689 Other specified diseases of liver: Secondary | ICD-10-CM | POA: Diagnosis not present

## 2021-10-25 DIAGNOSIS — K5730 Diverticulosis of large intestine without perforation or abscess without bleeding: Secondary | ICD-10-CM | POA: Diagnosis not present

## 2021-11-07 ENCOUNTER — Other Ambulatory Visit: Payer: Self-pay | Admitting: Internal Medicine

## 2021-11-07 ENCOUNTER — Other Ambulatory Visit (HOSPITAL_COMMUNITY): Payer: Self-pay | Admitting: Internal Medicine

## 2021-11-07 DIAGNOSIS — K769 Liver disease, unspecified: Secondary | ICD-10-CM

## 2021-11-18 DIAGNOSIS — K760 Fatty (change of) liver, not elsewhere classified: Secondary | ICD-10-CM | POA: Diagnosis not present

## 2021-11-18 DIAGNOSIS — R0602 Shortness of breath: Secondary | ICD-10-CM | POA: Diagnosis not present

## 2021-11-18 DIAGNOSIS — R059 Cough, unspecified: Secondary | ICD-10-CM | POA: Diagnosis not present

## 2021-11-18 DIAGNOSIS — J441 Chronic obstructive pulmonary disease with (acute) exacerbation: Secondary | ICD-10-CM | POA: Diagnosis not present

## 2021-11-18 DIAGNOSIS — R7989 Other specified abnormal findings of blood chemistry: Secondary | ICD-10-CM | POA: Diagnosis not present

## 2021-11-18 DIAGNOSIS — F17200 Nicotine dependence, unspecified, uncomplicated: Secondary | ICD-10-CM | POA: Diagnosis not present

## 2021-11-18 DIAGNOSIS — E119 Type 2 diabetes mellitus without complications: Secondary | ICD-10-CM | POA: Diagnosis not present

## 2021-11-18 DIAGNOSIS — R791 Abnormal coagulation profile: Secondary | ICD-10-CM | POA: Diagnosis not present

## 2021-11-18 DIAGNOSIS — I509 Heart failure, unspecified: Secondary | ICD-10-CM | POA: Diagnosis not present

## 2021-11-18 DIAGNOSIS — I110 Hypertensive heart disease with heart failure: Secondary | ICD-10-CM | POA: Diagnosis not present

## 2021-11-18 DIAGNOSIS — C229 Malignant neoplasm of liver, not specified as primary or secondary: Secondary | ICD-10-CM | POA: Diagnosis not present

## 2021-11-18 DIAGNOSIS — I700 Atherosclerosis of aorta: Secondary | ICD-10-CM | POA: Diagnosis not present

## 2021-11-18 DIAGNOSIS — Z720 Tobacco use: Secondary | ICD-10-CM | POA: Diagnosis not present

## 2022-01-14 DIAGNOSIS — K219 Gastro-esophageal reflux disease without esophagitis: Secondary | ICD-10-CM | POA: Diagnosis not present

## 2022-01-14 DIAGNOSIS — K5792 Diverticulitis of intestine, part unspecified, without perforation or abscess without bleeding: Secondary | ICD-10-CM | POA: Diagnosis not present

## 2022-01-14 DIAGNOSIS — K7460 Unspecified cirrhosis of liver: Secondary | ICD-10-CM | POA: Diagnosis not present

## 2022-01-14 DIAGNOSIS — I509 Heart failure, unspecified: Secondary | ICD-10-CM | POA: Diagnosis not present

## 2022-01-14 DIAGNOSIS — Z7984 Long term (current) use of oral hypoglycemic drugs: Secondary | ICD-10-CM | POA: Diagnosis not present

## 2022-01-14 DIAGNOSIS — R109 Unspecified abdominal pain: Secondary | ICD-10-CM | POA: Diagnosis not present

## 2022-01-14 DIAGNOSIS — Z79899 Other long term (current) drug therapy: Secondary | ICD-10-CM | POA: Diagnosis not present

## 2022-01-14 DIAGNOSIS — R1010 Upper abdominal pain, unspecified: Secondary | ICD-10-CM | POA: Diagnosis not present

## 2022-01-14 DIAGNOSIS — E119 Type 2 diabetes mellitus without complications: Secondary | ICD-10-CM | POA: Diagnosis not present

## 2022-01-14 DIAGNOSIS — I110 Hypertensive heart disease with heart failure: Secondary | ICD-10-CM | POA: Diagnosis not present

## 2022-01-14 DIAGNOSIS — R001 Bradycardia, unspecified: Secondary | ICD-10-CM | POA: Diagnosis not present

## 2022-01-14 DIAGNOSIS — F17200 Nicotine dependence, unspecified, uncomplicated: Secondary | ICD-10-CM | POA: Diagnosis not present

## 2022-01-14 DIAGNOSIS — R9431 Abnormal electrocardiogram [ECG] [EKG]: Secondary | ICD-10-CM | POA: Diagnosis not present

## 2022-03-21 DIAGNOSIS — E1143 Type 2 diabetes mellitus with diabetic autonomic (poly)neuropathy: Secondary | ICD-10-CM | POA: Diagnosis not present

## 2022-03-21 DIAGNOSIS — I700 Atherosclerosis of aorta: Secondary | ICD-10-CM | POA: Diagnosis not present

## 2022-03-21 DIAGNOSIS — K219 Gastro-esophageal reflux disease without esophagitis: Secondary | ICD-10-CM | POA: Diagnosis not present

## 2022-03-21 DIAGNOSIS — E1121 Type 2 diabetes mellitus with diabetic nephropathy: Secondary | ICD-10-CM | POA: Diagnosis not present

## 2022-03-21 DIAGNOSIS — N183 Chronic kidney disease, stage 3 unspecified: Secondary | ICD-10-CM | POA: Diagnosis not present

## 2022-03-25 DIAGNOSIS — I700 Atherosclerosis of aorta: Secondary | ICD-10-CM | POA: Diagnosis not present

## 2022-03-25 DIAGNOSIS — E1121 Type 2 diabetes mellitus with diabetic nephropathy: Secondary | ICD-10-CM | POA: Diagnosis not present

## 2022-03-25 DIAGNOSIS — N183 Chronic kidney disease, stage 3 unspecified: Secondary | ICD-10-CM | POA: Diagnosis not present

## 2022-03-25 DIAGNOSIS — E1143 Type 2 diabetes mellitus with diabetic autonomic (poly)neuropathy: Secondary | ICD-10-CM | POA: Diagnosis not present

## 2022-03-25 DIAGNOSIS — Z0000 Encounter for general adult medical examination without abnormal findings: Secondary | ICD-10-CM | POA: Diagnosis not present

## 2022-03-25 DIAGNOSIS — K219 Gastro-esophageal reflux disease without esophagitis: Secondary | ICD-10-CM | POA: Diagnosis not present

## 2022-04-05 IMAGING — RF DG C-ARM 1-60 MIN-NO REPORT
1 series · 4 of 4 positions shown · non-contrast
Comparison: None.

CLINICAL DATA: Left total hip replacement.

EXAM:
OPERATIVE LEFT HIP (WITH PELVIS IF PERFORMED)
TECHNIQUE: Fluoroscopic spot image(s) were submitted for interpretation
post-operatively.

[Series 1: unknown protocol · 4 of 4 slices shown]
[im 1/4]
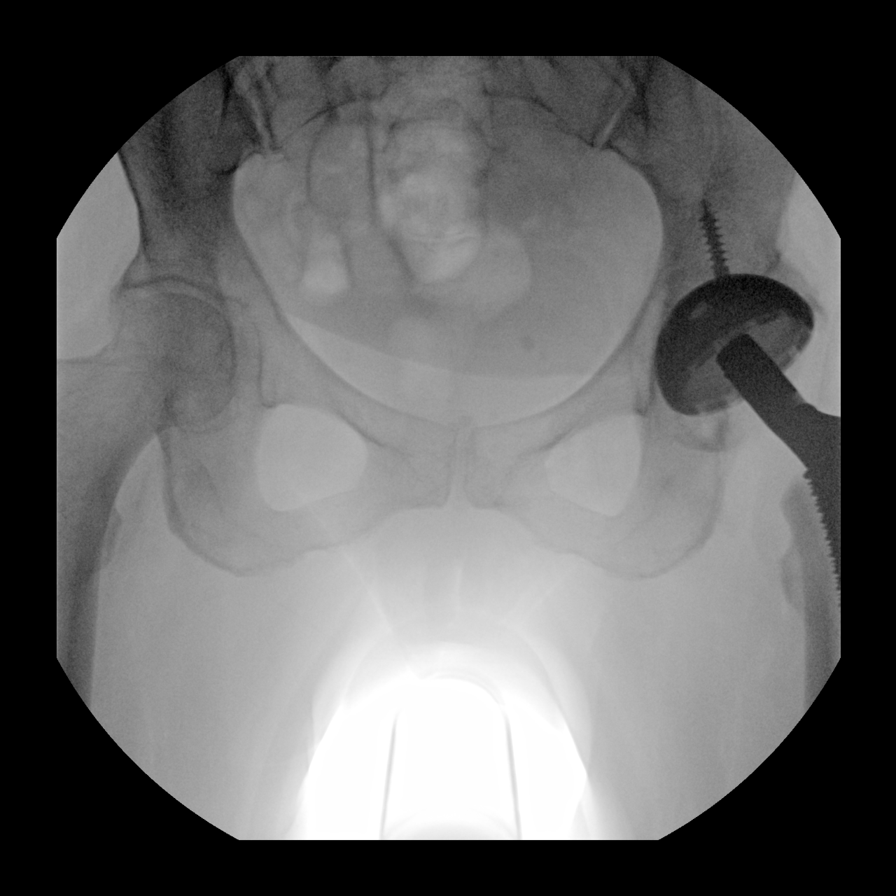
[im 2/4]
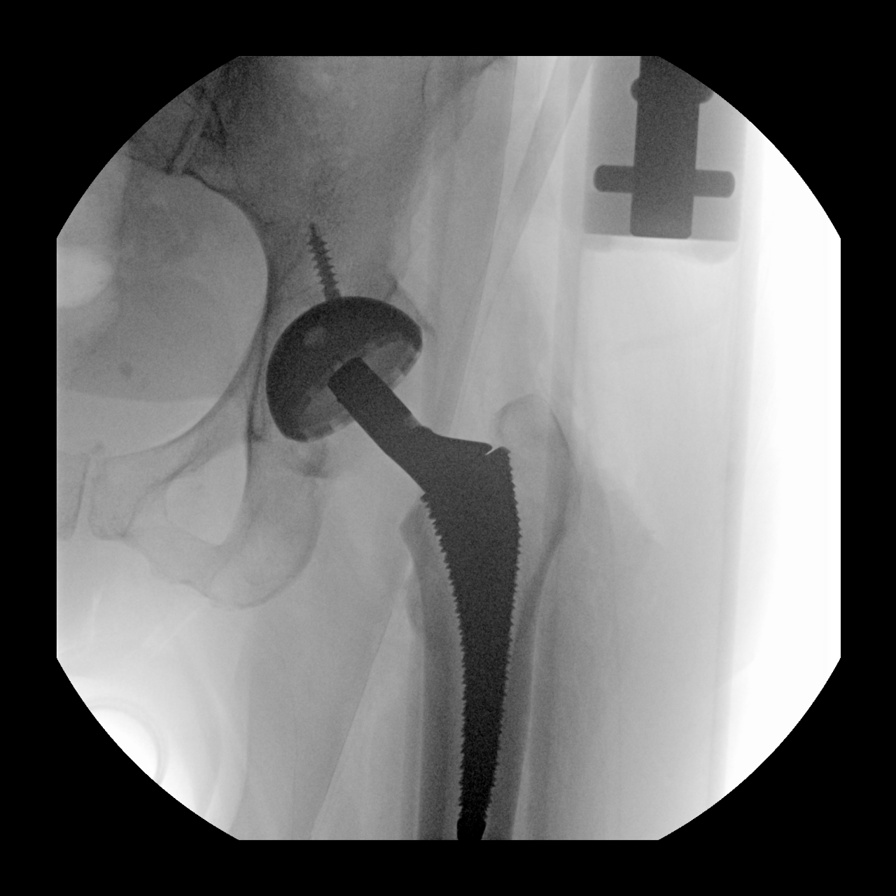
[im 3/4]
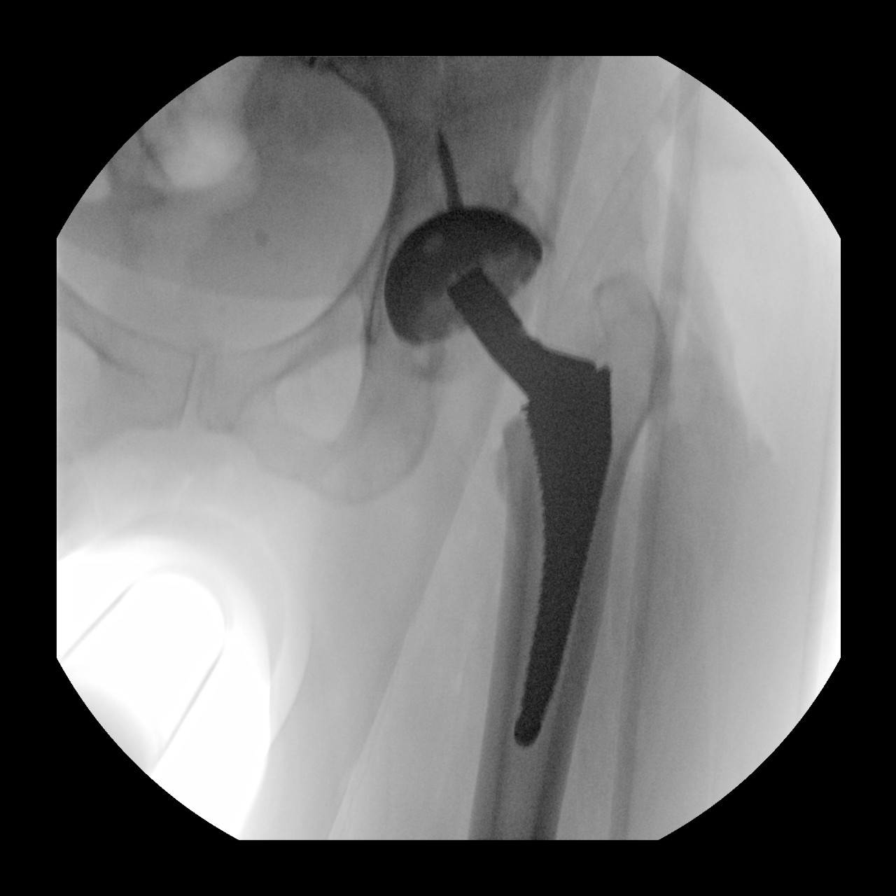
[im 4/4]
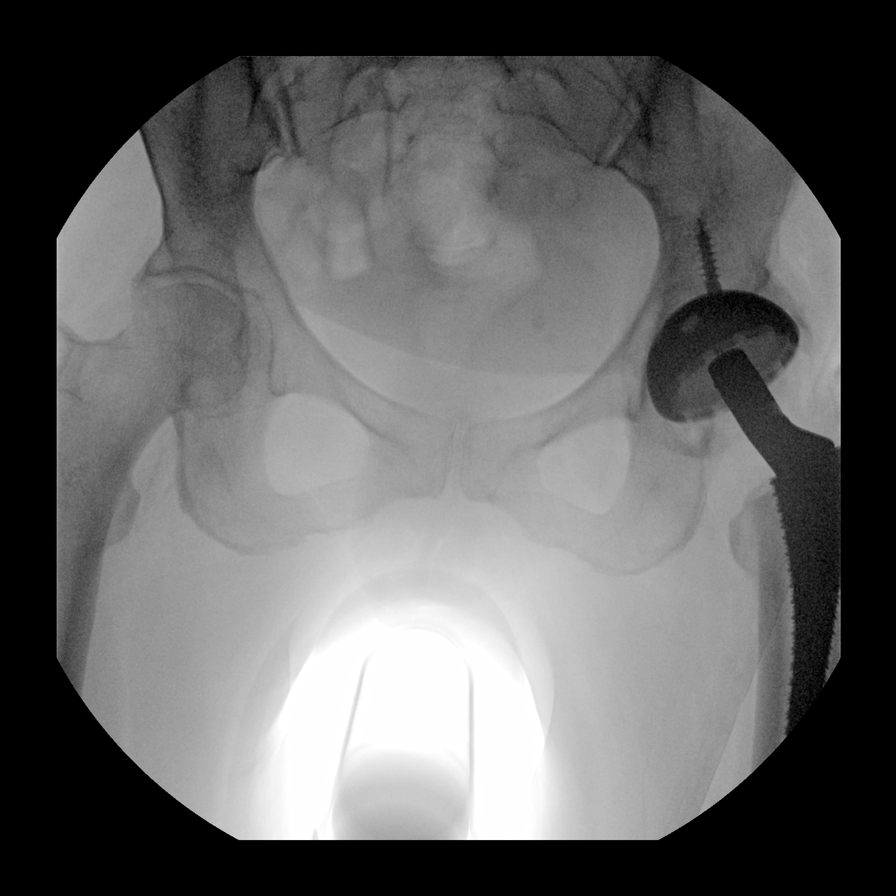

[4 of 4 positions shown; findings below may reference images not displayed]

FINDINGS: Four fluoroscopic spot views of the pelvis and left hip obtained in
the operating room during left hip arthroplasty. Total fluoroscopy
time 13 seconds.
IMPRESSION: Procedural fluoroscopy for left hip arthroplasty.

## 2022-04-23 DIAGNOSIS — Z0000 Encounter for general adult medical examination without abnormal findings: Secondary | ICD-10-CM | POA: Diagnosis not present

## 2022-04-23 DIAGNOSIS — N183 Chronic kidney disease, stage 3 unspecified: Secondary | ICD-10-CM | POA: Diagnosis not present

## 2022-04-23 DIAGNOSIS — I10 Essential (primary) hypertension: Secondary | ICD-10-CM | POA: Diagnosis not present

## 2022-04-23 DIAGNOSIS — I700 Atherosclerosis of aorta: Secondary | ICD-10-CM | POA: Diagnosis not present

## 2022-04-23 DIAGNOSIS — E1143 Type 2 diabetes mellitus with diabetic autonomic (poly)neuropathy: Secondary | ICD-10-CM | POA: Diagnosis not present

## 2022-04-23 DIAGNOSIS — K219 Gastro-esophageal reflux disease without esophagitis: Secondary | ICD-10-CM | POA: Diagnosis not present

## 2022-05-21 DIAGNOSIS — E1143 Type 2 diabetes mellitus with diabetic autonomic (poly)neuropathy: Secondary | ICD-10-CM | POA: Diagnosis not present

## 2022-05-21 DIAGNOSIS — N183 Chronic kidney disease, stage 3 unspecified: Secondary | ICD-10-CM | POA: Diagnosis not present

## 2022-05-21 DIAGNOSIS — K219 Gastro-esophageal reflux disease without esophagitis: Secondary | ICD-10-CM | POA: Diagnosis not present

## 2022-05-21 DIAGNOSIS — I700 Atherosclerosis of aorta: Secondary | ICD-10-CM | POA: Diagnosis not present

## 2022-05-21 DIAGNOSIS — I10 Essential (primary) hypertension: Secondary | ICD-10-CM | POA: Diagnosis not present

## 2022-06-03 ENCOUNTER — Other Ambulatory Visit (HOSPITAL_COMMUNITY): Payer: Self-pay | Admitting: Hematology

## 2022-10-07 DIAGNOSIS — I509 Heart failure, unspecified: Secondary | ICD-10-CM | POA: Diagnosis not present

## 2022-10-07 DIAGNOSIS — I110 Hypertensive heart disease with heart failure: Secondary | ICD-10-CM | POA: Diagnosis not present

## 2022-10-07 DIAGNOSIS — R059 Cough, unspecified: Secondary | ICD-10-CM | POA: Diagnosis not present

## 2022-10-07 DIAGNOSIS — Z720 Tobacco use: Secondary | ICD-10-CM | POA: Diagnosis not present

## 2022-10-07 DIAGNOSIS — J209 Acute bronchitis, unspecified: Secondary | ICD-10-CM | POA: Diagnosis not present

## 2022-10-07 DIAGNOSIS — Z20822 Contact with and (suspected) exposure to covid-19: Secondary | ICD-10-CM | POA: Diagnosis not present

## 2022-10-07 DIAGNOSIS — E119 Type 2 diabetes mellitus without complications: Secondary | ICD-10-CM | POA: Diagnosis not present

## 2022-10-07 DIAGNOSIS — F17200 Nicotine dependence, unspecified, uncomplicated: Secondary | ICD-10-CM | POA: Diagnosis not present

## 2022-12-04 DIAGNOSIS — Z9049 Acquired absence of other specified parts of digestive tract: Secondary | ICD-10-CM | POA: Diagnosis not present

## 2022-12-04 DIAGNOSIS — M50323 Other cervical disc degeneration at C6-C7 level: Secondary | ICD-10-CM | POA: Diagnosis not present

## 2022-12-04 DIAGNOSIS — Z7984 Long term (current) use of oral hypoglycemic drugs: Secondary | ICD-10-CM | POA: Diagnosis not present

## 2022-12-04 DIAGNOSIS — M542 Cervicalgia: Secondary | ICD-10-CM | POA: Diagnosis not present

## 2022-12-04 DIAGNOSIS — Z981 Arthrodesis status: Secondary | ICD-10-CM | POA: Diagnosis not present

## 2022-12-04 DIAGNOSIS — F17200 Nicotine dependence, unspecified, uncomplicated: Secondary | ICD-10-CM | POA: Diagnosis not present

## 2022-12-04 DIAGNOSIS — M5031 Other cervical disc degeneration,  high cervical region: Secondary | ICD-10-CM | POA: Diagnosis not present

## 2022-12-04 DIAGNOSIS — Z79899 Other long term (current) drug therapy: Secondary | ICD-10-CM | POA: Diagnosis not present

## 2022-12-04 DIAGNOSIS — K219 Gastro-esophageal reflux disease without esophagitis: Secondary | ICD-10-CM | POA: Diagnosis not present

## 2022-12-04 DIAGNOSIS — M4802 Spinal stenosis, cervical region: Secondary | ICD-10-CM | POA: Diagnosis not present

## 2022-12-04 DIAGNOSIS — E119 Type 2 diabetes mellitus without complications: Secondary | ICD-10-CM | POA: Diagnosis not present

## 2022-12-04 DIAGNOSIS — I110 Hypertensive heart disease with heart failure: Secondary | ICD-10-CM | POA: Diagnosis not present

## 2022-12-04 DIAGNOSIS — I509 Heart failure, unspecified: Secondary | ICD-10-CM | POA: Diagnosis not present

## 2022-12-04 DIAGNOSIS — J45909 Unspecified asthma, uncomplicated: Secondary | ICD-10-CM | POA: Diagnosis not present

## 2022-12-21 DIAGNOSIS — K219 Gastro-esophageal reflux disease without esophagitis: Secondary | ICD-10-CM | POA: Diagnosis not present

## 2022-12-21 DIAGNOSIS — I10 Essential (primary) hypertension: Secondary | ICD-10-CM | POA: Diagnosis not present

## 2022-12-21 DIAGNOSIS — N1831 Chronic kidney disease, stage 3a: Secondary | ICD-10-CM | POA: Diagnosis not present

## 2022-12-21 DIAGNOSIS — I700 Atherosclerosis of aorta: Secondary | ICD-10-CM | POA: Diagnosis not present

## 2022-12-21 DIAGNOSIS — E1143 Type 2 diabetes mellitus with diabetic autonomic (poly)neuropathy: Secondary | ICD-10-CM | POA: Diagnosis not present

## 2022-12-23 ENCOUNTER — Other Ambulatory Visit: Payer: Self-pay

## 2022-12-23 DIAGNOSIS — Z87891 Personal history of nicotine dependence: Secondary | ICD-10-CM

## 2022-12-23 DIAGNOSIS — Z122 Encounter for screening for malignant neoplasm of respiratory organs: Secondary | ICD-10-CM

## 2022-12-23 NOTE — Progress Notes (Signed)
LDCT order placed per protocol. Scan scheduled for 02/05 at 1200

## 2022-12-31 DIAGNOSIS — Z0000 Encounter for general adult medical examination without abnormal findings: Secondary | ICD-10-CM | POA: Diagnosis not present

## 2022-12-31 DIAGNOSIS — N183 Chronic kidney disease, stage 3 unspecified: Secondary | ICD-10-CM | POA: Diagnosis not present

## 2022-12-31 DIAGNOSIS — N1831 Chronic kidney disease, stage 3a: Secondary | ICD-10-CM | POA: Diagnosis not present

## 2022-12-31 DIAGNOSIS — E1143 Type 2 diabetes mellitus with diabetic autonomic (poly)neuropathy: Secondary | ICD-10-CM | POA: Diagnosis not present

## 2022-12-31 DIAGNOSIS — I10 Essential (primary) hypertension: Secondary | ICD-10-CM | POA: Diagnosis not present

## 2022-12-31 DIAGNOSIS — K219 Gastro-esophageal reflux disease without esophagitis: Secondary | ICD-10-CM | POA: Diagnosis not present

## 2022-12-31 DIAGNOSIS — I700 Atherosclerosis of aorta: Secondary | ICD-10-CM | POA: Diagnosis not present

## 2023-01-19 ENCOUNTER — Encounter: Payer: Self-pay | Admitting: Hematology

## 2023-01-22 ENCOUNTER — Encounter: Payer: Self-pay | Admitting: Hematology

## 2023-01-26 ENCOUNTER — Ambulatory Visit (HOSPITAL_COMMUNITY): Payer: 59

## 2023-02-05 NOTE — Progress Notes (Signed)
Patient no showed for LDCT on 02/05. Attempted to reach patient to reschedule, unable to reach patient directly. VM box is full and cannot accept messages.

## 2023-02-11 NOTE — Progress Notes (Signed)
Patient no showed for LDCT on 02/05. Attempted to reach patient to reschedule, unable to reach patient directly. VM box is full and cannot accept messages.

## 2023-02-26 DIAGNOSIS — Z7951 Long term (current) use of inhaled steroids: Secondary | ICD-10-CM | POA: Diagnosis not present

## 2023-02-26 DIAGNOSIS — R202 Paresthesia of skin: Secondary | ICD-10-CM | POA: Diagnosis not present

## 2023-02-26 DIAGNOSIS — R29810 Facial weakness: Secondary | ICD-10-CM | POA: Diagnosis not present

## 2023-02-26 DIAGNOSIS — J45909 Unspecified asthma, uncomplicated: Secondary | ICD-10-CM | POA: Diagnosis not present

## 2023-02-26 DIAGNOSIS — K219 Gastro-esophageal reflux disease without esophagitis: Secondary | ICD-10-CM | POA: Diagnosis not present

## 2023-02-26 DIAGNOSIS — M79641 Pain in right hand: Secondary | ICD-10-CM | POA: Diagnosis not present

## 2023-02-26 DIAGNOSIS — R519 Headache, unspecified: Secondary | ICD-10-CM | POA: Diagnosis not present

## 2023-02-26 DIAGNOSIS — E119 Type 2 diabetes mellitus without complications: Secondary | ICD-10-CM | POA: Diagnosis not present

## 2023-02-26 DIAGNOSIS — I110 Hypertensive heart disease with heart failure: Secondary | ICD-10-CM | POA: Diagnosis not present

## 2023-02-26 DIAGNOSIS — Z79899 Other long term (current) drug therapy: Secondary | ICD-10-CM | POA: Diagnosis not present

## 2023-02-26 DIAGNOSIS — F17290 Nicotine dependence, other tobacco product, uncomplicated: Secondary | ICD-10-CM | POA: Diagnosis not present

## 2023-02-26 DIAGNOSIS — Z7984 Long term (current) use of oral hypoglycemic drugs: Secondary | ICD-10-CM | POA: Diagnosis not present

## 2023-02-26 DIAGNOSIS — M79671 Pain in right foot: Secondary | ICD-10-CM | POA: Diagnosis not present

## 2023-02-26 DIAGNOSIS — I509 Heart failure, unspecified: Secondary | ICD-10-CM | POA: Diagnosis not present

## 2023-02-26 DIAGNOSIS — N3001 Acute cystitis with hematuria: Secondary | ICD-10-CM | POA: Diagnosis not present

## 2023-04-08 NOTE — Progress Notes (Signed)
Patient is past due for annual LDCT. Multiple telephone attempts made to reach the patient to schedule LDCT. Reminder letter sent today. Referral to be closed at this time due to multiple unsuccessful attempts to reach patient.  

## 2023-05-05 DIAGNOSIS — N1831 Chronic kidney disease, stage 3a: Secondary | ICD-10-CM | POA: Diagnosis not present

## 2023-05-05 DIAGNOSIS — E1143 Type 2 diabetes mellitus with diabetic autonomic (poly)neuropathy: Secondary | ICD-10-CM | POA: Diagnosis not present

## 2023-05-05 DIAGNOSIS — I10 Essential (primary) hypertension: Secondary | ICD-10-CM | POA: Diagnosis not present

## 2023-05-05 DIAGNOSIS — I700 Atherosclerosis of aorta: Secondary | ICD-10-CM | POA: Diagnosis not present

## 2023-05-05 DIAGNOSIS — K219 Gastro-esophageal reflux disease without esophagitis: Secondary | ICD-10-CM | POA: Diagnosis not present

## 2023-07-14 ENCOUNTER — Telehealth: Payer: Self-pay | Admitting: *Deleted

## 2023-07-14 NOTE — Progress Notes (Signed)
  Care Coordination  Outreach Note  07/14/2023 Name: Gabriella Leon MRN: [id number] DOB: July 22, 1956   Care Coordination Outreach Attempts: An unsuccessful telephone outreach was attempted today to offer the patient information about available care coordination services.  Follow Up Plan:  Additional outreach attempts will be made to offer the patient care coordination information and services.   Encounter Outcome:  No Answer  Christie Nottingham  Care Coordination Care Guide  Direct Dial: [phone]'

## 2023-07-20 NOTE — Progress Notes (Unsigned)
  Care Coordination  Outreach Note  07/20/2023 Name: Gabriella Leon MRN: [id number] DOB: [birth year]   Care Coordination Outreach Attempts: A second unsuccessful outreach was attempted today to offer the patient with information about available care coordination services.  Follow Up Plan:  Additional outreach attempts will be made to offer the patient care coordination information and services.   Encounter Outcome:  No Answer   Gwenevere Ghazi  Care Coordination Care Guide  Direct Dial: [phone]

## 2023-07-22 NOTE — Progress Notes (Signed)
  Care Coordination  Outreach Note  07/22/2023 Name: Gabriella Leon MRN: [id number] DOB: [birth year]   Care Coordination Outreach Attempts: A third unsuccessful outreach was attempted today to offer the patient with information about available care coordination services.  Follow Up Plan:  No further outreach attempts will be made at this time. We have been unable to contact the patient to offer or enroll patient in care coordination services  Encounter Outcome:  No Answer  Christie Nottingham  Care Coordination Care Guide  Direct Dial: [phone]

## 2023-08-18 DIAGNOSIS — E1143 Type 2 diabetes mellitus with diabetic autonomic (poly)neuropathy: Secondary | ICD-10-CM | POA: Diagnosis not present

## 2023-08-18 DIAGNOSIS — Z0000 Encounter for general adult medical examination without abnormal findings: Secondary | ICD-10-CM | POA: Diagnosis not present

## 2023-08-18 DIAGNOSIS — N1831 Chronic kidney disease, stage 3a: Secondary | ICD-10-CM | POA: Diagnosis not present

## 2023-08-18 DIAGNOSIS — I700 Atherosclerosis of aorta: Secondary | ICD-10-CM | POA: Diagnosis not present

## 2023-08-18 DIAGNOSIS — I10 Essential (primary) hypertension: Secondary | ICD-10-CM | POA: Diagnosis not present

## 2023-08-18 DIAGNOSIS — K219 Gastro-esophageal reflux disease without esophagitis: Secondary | ICD-10-CM | POA: Diagnosis not present

## 2023-10-02 DIAGNOSIS — E1143 Type 2 diabetes mellitus with diabetic autonomic (poly)neuropathy: Secondary | ICD-10-CM | POA: Diagnosis not present

## 2023-10-02 DIAGNOSIS — N1831 Chronic kidney disease, stage 3a: Secondary | ICD-10-CM | POA: Diagnosis not present

## 2023-10-02 DIAGNOSIS — N182 Chronic kidney disease, stage 2 (mild): Secondary | ICD-10-CM | POA: Diagnosis not present

## 2023-10-02 DIAGNOSIS — K219 Gastro-esophageal reflux disease without esophagitis: Secondary | ICD-10-CM | POA: Diagnosis not present

## 2023-10-02 DIAGNOSIS — I10 Essential (primary) hypertension: Secondary | ICD-10-CM | POA: Diagnosis not present

## 2023-10-02 DIAGNOSIS — I700 Atherosclerosis of aorta: Secondary | ICD-10-CM | POA: Diagnosis not present

## 2023-10-19 DIAGNOSIS — K7460 Unspecified cirrhosis of liver: Secondary | ICD-10-CM | POA: Diagnosis not present

## 2023-10-19 DIAGNOSIS — K766 Portal hypertension: Secondary | ICD-10-CM | POA: Diagnosis not present

## 2023-10-19 DIAGNOSIS — R160 Hepatomegaly, not elsewhere classified: Secondary | ICD-10-CM | POA: Diagnosis not present

## 2023-10-19 DIAGNOSIS — R109 Unspecified abdominal pain: Secondary | ICD-10-CM | POA: Diagnosis not present

## 2023-10-19 DIAGNOSIS — R1900 Intra-abdominal and pelvic swelling, mass and lump, unspecified site: Secondary | ICD-10-CM | POA: Diagnosis not present

## 2023-11-11 ENCOUNTER — Encounter: Payer: Self-pay | Admitting: Oncology

## 2023-11-11 ENCOUNTER — Inpatient Hospital Stay: Payer: 59

## 2023-11-11 ENCOUNTER — Inpatient Hospital Stay: Payer: 59 | Attending: Oncology | Admitting: Oncology

## 2023-11-11 VITALS — BP 174/83 | HR 68 | Temp 97.8°F | Resp 20 | Ht 64.0 in | Wt 172.4 lb

## 2023-11-11 DIAGNOSIS — Z789 Other specified health status: Secondary | ICD-10-CM | POA: Insufficient documentation

## 2023-11-11 DIAGNOSIS — R932 Abnormal findings on diagnostic imaging of liver and biliary tract: Secondary | ICD-10-CM | POA: Insufficient documentation

## 2023-11-11 DIAGNOSIS — R1010 Upper abdominal pain, unspecified: Secondary | ICD-10-CM | POA: Insufficient documentation

## 2023-11-11 DIAGNOSIS — K769 Liver disease, unspecified: Secondary | ICD-10-CM

## 2023-11-11 DIAGNOSIS — F1090 Alcohol use, unspecified, uncomplicated: Secondary | ICD-10-CM

## 2023-11-11 DIAGNOSIS — R160 Hepatomegaly, not elsewhere classified: Secondary | ICD-10-CM

## 2023-11-11 DIAGNOSIS — Z809 Family history of malignant neoplasm, unspecified: Secondary | ICD-10-CM | POA: Insufficient documentation

## 2023-11-11 DIAGNOSIS — Z720 Tobacco use: Secondary | ICD-10-CM

## 2023-11-11 LAB — COMPREHENSIVE METABOLIC PANEL
ALT: 20 U/L (ref 0–44)
AST: 41 U/L (ref 15–41)
Albumin: 3.2 g/dL — ABNORMAL LOW (ref 3.5–5.0)
Alkaline Phosphatase: 161 U/L — ABNORMAL HIGH (ref 38–126)
Anion gap: 10 (ref 5–15)
BUN: 15 mg/dL (ref 8–23)
CO2: 22 mmol/L (ref 22–32)
Calcium: 9.1 mg/dL (ref 8.9–10.3)
Chloride: 107 mmol/L (ref 98–111)
Creatinine, Ser: 1.18 mg/dL — ABNORMAL HIGH (ref 0.44–1.00)
GFR, Estimated: 51 mL/min — ABNORMAL LOW (ref >60)
Glucose, Bld: 104 mg/dL — ABNORMAL HIGH (ref 70–99)
Potassium: 3.8 mmol/L (ref 3.5–5.1)
Sodium: 139 mmol/L (ref 135–145)
Total Bilirubin: 2.1 mg/dL — ABNORMAL HIGH (ref <1.2)
Total Protein: 7.5 g/dL (ref 6.5–8.1)

## 2023-11-11 LAB — CBC WITH DIFFERENTIAL/PLATELET
Abs Immature Granulocytes: 0.01 10*3/uL (ref 0.00–0.07)
Basophils Absolute: 0 10*3/uL (ref 0.0–0.1)
Basophils Relative: 1 %
Eosinophils Absolute: 0.1 10*3/uL (ref 0.0–0.5)
Eosinophils Relative: 5 %
HCT: 32 % — ABNORMAL LOW (ref 36.0–46.0)
Hemoglobin: 10.6 g/dL — ABNORMAL LOW (ref 12.0–15.0)
Immature Granulocytes: 0 %
Lymphocytes Relative: 34 %
Lymphs Abs: 0.9 10*3/uL (ref 0.7–4.0)
MCH: 34.8 pg — ABNORMAL HIGH (ref 26.0–34.0)
MCHC: 33.1 g/dL (ref 30.0–36.0)
MCV: 104.9 fL — ABNORMAL HIGH (ref 80.0–100.0)
Monocytes Absolute: 0.3 10*3/uL (ref 0.1–1.0)
Monocytes Relative: 10 %
Neutro Abs: 1.3 10*3/uL — ABNORMAL LOW (ref 1.7–7.7)
Neutrophils Relative %: 50 %
Platelets: 80 10*3/uL — ABNORMAL LOW (ref 150–400)
RBC: 3.05 MIL/uL — ABNORMAL LOW (ref 3.87–5.11)
RDW: 13.7 % (ref 11.5–15.5)
WBC: 2.7 10*3/uL — ABNORMAL LOW (ref 4.0–10.5)
nRBC: 0 % (ref 0.0–0.2)

## 2023-11-11 LAB — HEPATITIS B SURFACE ANTIBODY,QUALITATIVE: Hep B S Ab: NONREACTIVE

## 2023-11-11 LAB — PROTIME-INR
INR: 1.2 (ref 0.8–1.2)
Prothrombin Time: 15.5 s — ABNORMAL HIGH (ref 11.4–15.2)

## 2023-11-11 LAB — HEPATITIS B SURFACE ANTIGEN: Hepatitis B Surface Ag: NONREACTIVE

## 2023-11-11 LAB — APTT: aPTT: 33 s (ref 24–36)

## 2023-11-11 LAB — HEPATITIS B CORE ANTIBODY, TOTAL: Hep B Core Total Ab: NONREACTIVE

## 2023-11-11 NOTE — Assessment & Plan Note (Signed)
History of heavy drinking with recent attempts at sobriety. -Encourage continued efforts towards sobriety.

## 2023-11-11 NOTE — Progress Notes (Signed)
Hematology-Oncology Clinic Note  Toma Deiters, MD   Reason for Referral: Liver lesions  Oncology History: I have reviewed her chart and materials related to her cancer extensively and collaborated history with the patient. Summary of oncologic history is as follows: Oncology History  Liver lesion  10/19/2023 Imaging   Ultrasound abdomen: Liver: Diffusely echogenic with irregular, nodular contours.  The previously demonstrated 3.2 cm left lobe liver mass currently measures 2.2-1.8 and 1.6 cm.  Interval 1.3 and 1.1 into 0.8 cm similar-appearing mass more laterally in the lateral segment of the left lobe of the liver.  Interval 1.6 and 1.5 into 1.3 cm oval, heterogeneous mass more anteriorly in the liver on the right.  Portal vein is patent on color Doppler imaging with normal direction of blood flow to the liver   11/11/2023 Initial Diagnosis   Liver lesion       History of Presenting Illness: Gabriella Leon 67 y.o. female is here because of newly diagnosed liver lesions.The patient presents for evaluation of three liver lesions identified on ultrasound by her primary care provider.  The patient has been experiencing abdominal pain for several months, which she describes as 'something sitting on my chest.' She also reports shortness of breath for the same duration. She denies any history of COPD or emphysema. She also denies any blood in her stools or bloody vomiting.  She denies nausea, vomiting, loss of appetite and weight loss.  She was doing well until the abdominal pain recently that led to the ultrasound.  She has no other complaints today.  The patient has a significant smoking history, consuming half a pack a day for many years. She also has a history of alcohol use, currently consuming a pint a day. She reports periods of abstinence lasting about two weeks, followed by relapses lasting about two days. The patient lives alone and is less active around the house than she  used to be, but she is still able to cook for herself. She is retired from a career in home care.   Medical History: Past Medical History:  Diagnosis Date   Acid reflux disease with ulcer    Anxiety    Arthritis    Asthma    Blood dyscrasia    thrombocytopenia-next tx injection 09/25/15   Blood transfusion    CHF (congestive heart failure) (HCC) 2021   Chronic kidney disease    Cirrhosis (HCC)    DDD (degenerative disc disease), lumbar    Depression    Diabetes mellitus    Dyspnea    Esophageal varices (HCC) 09/18/2014   Folate deficiency 03/28/2014   Noted on 03/23/2014.  Folate 1 mg prescribed.   Gallstones    GERD (gastroesophageal reflux disease)    Headache    migraines   Helicobacter pylori ab+    July 2010, s/p treatment   Hepatitis B antibody positive    Hepatitis C    and B positive antibody   History of alcoholism (HCC)    History of kidney stones    Hypertension    Left leg pain    Liver cirrhosis (HCC)    Low back pain    Other pancytopenia (HCC) 10/27/2013   Ulcer    ?    Surgical history: Past Surgical History:  Procedure Laterality Date   ANTERIOR CERVICAL DECOMP/DISCECTOMY FUSION N/A 09/28/2015   Procedure: Cervical three-four Anterior cervical decompression/diskectomy/fusion;  Surgeon: Hilda Lias, MD;  Location: [facility];  Service: Neurosurgery;  Laterality: N/A;  C3-4 Anterior cervical decompression/diskectomy/fusion   APPENDECTOMY     age 42   BIOPSY  12/18/2011       BIOPSY  05/19/2019   Procedure: BIOPSY;  Surgeon: Corbin Ade, MD;  Location: [facility];  Service: Endoscopy;;  gastric   CESAREAN SECTION  1988   [facility]   CHOLECYSTECTOMY  greater than 10 yrs   [facility]   COLONOSCOPY  12/18/11   minimal anal canal hemorrhoids, friable rectal and colonic mucosa, left-sided diverticulosis, repeat in 2022.    ESOPHAGOGASTRODUODENOSCOPY  12/18/11   3 columns of Grade II esophageal varices, reflux esohpagitis, portal gastropathy, path with  chronic gastritis, negative H.pylori, surveillance in June 2014   ESOPHAGOGASTRODUODENOSCOPY (EGD) WITH PROPOFOL N/A 04/13/2014   Dr. Jena Gauss: grade 1-2 varices, hiatal hernia, portal gastropathy. NO need for further surveillance while on prophylaxis unless evidence of bleeding   ESOPHAGOGASTRODUODENOSCOPY (EGD) WITH PROPOFOL N/A 05/19/2019   erosive reflux esophagitis, Grade 1-2 esophageal varices with patulous EG junction. Hiatal hernia. Portal gastropathy. Gastric and duodenal dulcer disease.    MULTIPLE EXTRACTIONS WITH ALVEOLOPLASTY  03/08/2012   Procedure: MULTIPLE EXTRACION WITH ALVEOLOPLASTY;  Surgeon: Georgia Lopes, DDS;  Location: [facility];  Service: Oral Surgery;  Laterality: N/A;   TOTAL HIP ARTHROPLASTY Left 04/30/2021   Procedure: TOTAL HIP ARTHROPLASTY ANTERIOR APPROACH;  Surgeon: Durene Romans, MD;  Location: [facility];  Service: Orthopedics;  Laterality: Left;    Social History: Social History   Socioeconomic History   Marital status: Legally Separated    Spouse name: Not on file   Number of children: 1   Years of education: 10th grade   Highest education level: Not on file  Occupational History   Occupation: disabled    Employer: NOT EMPLOYED  Tobacco Use   Smoking status: Every Day    Current packs/day: 0.25    Average packs/day: 0.3 packs/day for 20.0 years (5.0 ttl pk-yrs)    Types: Cigarettes   Smokeless tobacco: Never  Vaping Use   Vaping status: Never Used  Substance and Sexual Activity   Alcohol use: Yes    Comment: occasionally   Drug use: No   Sexual activity: Yes    Birth control/protection: Post-menopausal  Other Topics Concern   Not on file  Social History Narrative   Lives at home alone.   Right-handed.   Occasional caffeine use.   Social Determinants of Health   Financial Resource Strain: Not on file  Food Insecurity: No Food Insecurity (11/11/2023)   Hunger Vital Sign    Worried About Running Out of Food in the Last Year: Never true    Ran Out  of Food in the Last Year: Never true  Transportation Needs: Unmet Transportation Needs (11/11/2023)   PRAPARE - Administrator, Civil Service (Medical): Yes    Lack of Transportation (Non-Medical): Yes  Physical Activity: Not on file  Stress: Not on file  Social Connections: Not on file  Intimate Partner Violence: Not At Risk (11/11/2023)   Humiliation, Afraid, Rape, and Kick questionnaire    Fear of Current or Ex-Partner: No    Emotionally Abused: No    Physically Abused: No    Sexually Abused: No    Family History: Family History  Problem Relation Age of Onset   Diabetes Mother    Heart disease Mother    Cancer Paternal Uncle        passed away age 48   Other Father  died in MVA in 1990   Heart disease Sister    Anesthesia problems Neg Hx    Hypotension Neg Hx    Malignant hyperthermia Neg Hx    Pseudochol deficiency Neg Hx    Colon cancer Neg Hx    Liver disease Neg Hx     Allergies:  has No Known Allergies.  Medications:  Current Outpatient Medications  Medication Sig Dispense Refill   famotidine (PEPCID) 20 MG tablet Take 20 mg by mouth 2 (two) times daily.      lisinopril-hydrochlorothiazide (PRINZIDE,ZESTORETIC) 10-12.5 MG per tablet Take 1 tablet by mouth daily.     metFORMIN (GLUCOPHAGE) 500 MG tablet Take 500 mg by mouth daily with breakfast.     methocarbamol (ROBAXIN) 500 MG tablet Take 1 tablet (500 mg total) by mouth every 6 (six) hours as needed for muscle spasms. 40 tablet 0   nadolol (CORGARD) 40 MG tablet Take 40 mg by mouth daily.      oxyCODONE (OXY IR/ROXICODONE) 5 MG immediate release tablet Take 1-2 tablets (5-10 mg total) by mouth every 6 (six) hours as needed for severe pain (pain 7-10). 42 tablet 0   pantoprazole (PROTONIX) 40 MG tablet Take 40 mg by mouth 2 (two) times daily.     polyethylene glycol (MIRALAX / GLYCOLAX) 17 g packet Take 17 g by mouth daily as needed for mild constipation. 14 each 0   spironolactone (ALDACTONE)  25 MG tablet TAKE 1 TABLET BY MOUTH DAILY 90 tablet 1   thiamine (VITAMIN B-1) 100 MG tablet Take 100 mg by mouth daily.     Vitamin D, Ergocalciferol, (DRISDOL) 1.25 MG (50000 UNIT) CAPS capsule TAKE ONE CAPSULE BY MOUTH ONCE a WEEK 12 capsule 2   No current facility-administered medications for this visit.    Review of Systems: Constitutional: Denies fevers, chills or abnormal night sweats Eyes: Denies blurriness of vision, double vision or watery eyes Ears, nose, mouth, throat, and face: Denies mucositis or sore throat Respiratory: Denies cough, dyspnea or wheezes Cardiovascular: Denies palpitation, chest discomfort or lower extremity swelling Gastrointestinal:  Denies nausea, heartburn or change in bowel habits Skin: Denies abnormal skin rashes Lymphatics: Denies new lymphadenopathy or easy bruising Neurological:Denies numbness, tingling or new weaknesses Behavioral/Psych: Mood is stable, no new changes  All other systems were reviewed with the patient and are negative.  Physical Examination: ECOG PERFORMANCE STATUS: 1 - Symptomatic but completely ambulatory  Vitals:   11/11/23 1437  BP: (!) 174/83  Pulse: 68  Resp: 20  Temp: 97.8 F (36.6 C)  SpO2: 100%   Filed Weights   11/11/23 1437  Weight: 172 lb 6.4 oz (78.2 kg)    GENERAL:alert, no distress and comfortable LUNGS: clear to auscultation and percussion with normal breathing effort HEART: regular rate & rhythm and no murmurs and no lower extremity edema ABDOMEN:abdomen soft, and normal bowel sounds, diffuse abdominal tenderness. Musculoskeletal:no cyanosis of digits and no clubbing  PSYCH: alert & oriented x 3 with fluent speech  Laboratory Data: I have reviewed the data as listed Lab Results  Component Value Date   WBC 2.7 (L) 11/11/2023   HGB 10.6 (L) 11/11/2023   HCT 32.0 (L) 11/11/2023   MCV 104.9 (H) 11/11/2023   PLT 80 (L) 11/11/2023   Recent Labs    11/11/23 1521  NA 139  K 3.8  CL 107  CO2 22   GLUCOSE 104*  BUN 15  CREATININE 1.18*  CALCIUM 9.1  GFRNONAA 51*  PROT 7.5  ALBUMIN 3.2*  AST 41  ALT 20  ALKPHOS 161*  BILITOT 2.1*    ASSESSMENT & PLAN:  Patient is a 67 year old female with past medical history of smoking and alcohol use referred for newly diagnosed liver lesions on abdominal ultrasound   Liver lesion Three lesions identified on ultrasound. Discussed the need for further workup to determine if these are malignant. -Order MRI of liver to better characterize lesions. -Order blood work including tumor markers-AFP. -Discuss results and next steps in a couple of weeks.   Alcohol use History of heavy drinking with recent attempts at sobriety. -Encourage continued efforts towards sobriety.  Tobacco use Half a pack per day for many years. Discussed the exacerbating effect of smoking on any potential cancer diagnosis. -Advise patient to consider quitting smoking.   Orders Placed This Encounter  Procedures   MR LIVER W WO CONTRAST    Standing Status:   Future    Standing Expiration Date:   11/10/2024    Order Specific Question:   If indicated for the ordered procedure, I authorize the administration of contrast media per Radiology protocol    Answer:   Yes    Order Specific Question:   What is the patient's sedation requirement?    Answer:   No Sedation    Order Specific Question:   Does the patient have a pacemaker or implanted devices?    Answer:   No    Order Specific Question:   Release to patient    Answer:   Immediate    Order Specific Question:   Preferred imaging location?    Answer:   [facility] (table limit - 550lbs)   CBC with Differential    Standing Status:   Future    Number of Occurrences:   1    Standing Expiration Date:   11/10/2024   Comprehensive metabolic panel    Standing Status:   Future    Number of Occurrences:   1    Standing Expiration Date:   11/10/2024   AFP tumor marker    Standing Status:   Future    Number  of Occurrences:   1    Standing Expiration Date:   11/10/2024   Protime-INR    Standing Status:   Future    Number of Occurrences:   1    Standing Expiration Date:   11/10/2024   APTT    Standing Status:   Future    Number of Occurrences:   1    Standing Expiration Date:   11/10/2024   Hepatitis B surface antibody    Standing Status:   Future    Number of Occurrences:   1    Standing Expiration Date:   11/10/2024   Hepatitis B surface antigen    Standing Status:   Future    Number of Occurrences:   1    Standing Expiration Date:   11/10/2024   Hepatitis B core antibody, total    Standing Status:   Future    Number of Occurrences:   1    Standing Expiration Date:   11/10/2024   Hepatitis C Antibody    Standing Status:   Future    Number of Occurrences:   1    Standing Expiration Date:   11/10/2024   Ambulatory Referral to [facility]    Referral Priority:   Routine    Referral Type:   Consultation    Referral Reason:   Specialty Services Required  Requested Specialty:   Oncology    Number of Visits Requested:   1   Ambulatory referral to Social Work    Referral Priority:   Routine    Referral Type:   Consultation    Referral Reason:   Specialty Services Required    Number of Visits Requested:   1    The total time spent in the appointment was 60 minutes encounter with patients including review of chart and various tests results, discussions about plan of care and coordination of care plan   All questions were answered. The patient knows to call the clinic with any problems, questions or concerns. No barriers to learning was detected.  Cindie Crumbly, MD 11/20/20245:50 PM

## 2023-11-11 NOTE — Assessment & Plan Note (Signed)
Half a pack per day for many years. Discussed the exacerbating effect of smoking on any potential cancer diagnosis. -Advise patient to consider quitting smoking.

## 2023-11-11 NOTE — Assessment & Plan Note (Signed)
Three lesions identified on ultrasound. Discussed the need for further workup to determine if these are malignant. -Order MRI of liver to better characterize lesions. -Order blood work including tumor markers-AFP. -Discuss results and next steps in a couple of weeks.

## 2023-11-11 NOTE — Patient Instructions (Addendum)
[facility]  Discharge Instructions  You were seen and examined today by Dr. Anders Simmonds. Dr. Anders Simmonds is a medical oncologist, meaning that he specializes in the treatment of cancer diagnoses. Dr. Anders Simmonds discussed your past medical history, family history of cancers, and the events that led to you being here today.  You were referred to Dr. Anders Simmonds due to an abnormal ultrasound which revealed multiple lesions on your liver concerning for a type of cancer that arises in the liver.  Dr. Anders Simmonds has recommended additional labs today for further evaluation. Dr. Anders Simmonds will also order a MRI of the liver for further imaging, sometimes this can be diagnostic. If not, then we will then need a biopsy.  Follow-up as scheduled.  Thank you for choosing [facility] to provide your oncology and hematology care.   To afford each patient quality time with our provider, please arrive at least 15 minutes before your scheduled appointment time. You may need to reschedule your appointment if you arrive late (10 or more minutes). Arriving late affects you and other patients whose appointments are after yours.  Also, if you miss three or more appointments without notifying the office, you may be dismissed from the clinic at the provider's discretion.    Again, thank you for choosing [facility].  Our hope is that these requests will decrease the amount of time that you wait before being seen by our physicians.   If you have a lab appointment with the Cancer Center - please note that after April 8th, all labs will be drawn in the cancer center.  You do not have to check in or register with the main entrance as you have in the past but will complete your check-in at the cancer center.            _____________________________________________________________  Should you have questions after your visit to [facility], please contact our office  at [phone] and follow the prompts.  Our office hours are 8:00 a.m. to 4:30 p.m. Monday - Thursday and 8:00 a.m. to 2:30 p.m. Friday.  Please note that voicemails left after 4:00 p.m. may not be returned until the following business day.  We are closed weekends and all major holidays.  You do have access to a nurse 24-7, just call the main number to the clinic [phone] and do not press any options, hold on the line and a nurse will answer the phone.    For prescription refill requests, have your pharmacy contact our office and allow 72 hours.    Masks are no longer required in the cancer centers. If you would like for your care team to wear a mask while they are taking care of you, please let them know. You may have one support person who is at least 65 years old accompany you for your appointments.

## 2023-11-12 DIAGNOSIS — H3540 Unspecified peripheral retinal degeneration: Secondary | ICD-10-CM | POA: Diagnosis not present

## 2023-11-12 LAB — HEPATITIS C ANTIBODY: HCV Ab: REACTIVE — AB

## 2023-11-12 LAB — AFP TUMOR MARKER: AFP, Serum, Tumor Marker: 7.1 ng/mL (ref 0.0–9.2)

## 2023-11-17 ENCOUNTER — Ambulatory Visit (HOSPITAL_COMMUNITY): Payer: 59

## 2023-11-18 DIAGNOSIS — I10 Essential (primary) hypertension: Secondary | ICD-10-CM | POA: Diagnosis not present

## 2023-11-18 DIAGNOSIS — E1143 Type 2 diabetes mellitus with diabetic autonomic (poly)neuropathy: Secondary | ICD-10-CM | POA: Diagnosis not present

## 2023-11-18 DIAGNOSIS — N182 Chronic kidney disease, stage 2 (mild): Secondary | ICD-10-CM | POA: Diagnosis not present

## 2023-11-23 ENCOUNTER — Inpatient Hospital Stay: Payer: 59 | Attending: Oncology | Admitting: Dietician

## 2023-11-23 ENCOUNTER — Telehealth: Payer: Self-pay | Admitting: Dietician

## 2023-11-23 NOTE — Telephone Encounter (Signed)
Attempted to contact patient for scheduled nutrition assessment. Patient did not answer x2 attempts. VM not enabled on phone. Unable to leave message.

## 2023-11-26 ENCOUNTER — Other Ambulatory Visit: Payer: 59 | Admitting: Licensed Clinical Social Worker

## 2023-11-26 ENCOUNTER — Telehealth: Payer: Self-pay | Admitting: Licensed Clinical Social Worker

## 2023-11-26 ENCOUNTER — Inpatient Hospital Stay: Payer: 59 | Admitting: Oncology

## 2023-11-26 NOTE — Telephone Encounter (Signed)
Attempted to contact pt for scheduled assessment with no answer and voicemail is not set up, will try to follow up after diagnostic testing is completed.

## 2023-11-30 ENCOUNTER — Ambulatory Visit (HOSPITAL_COMMUNITY): Payer: 59

## 2023-12-04 ENCOUNTER — Ambulatory Visit (HOSPITAL_COMMUNITY)
Admission: RE | Admit: 2023-12-04 | Discharge: 2023-12-04 | Disposition: A | Discharge status: Home / Self Care | Payer: 59 | Source: Ambulatory Visit | Attending: Oncology | Admitting: Oncology

## 2023-12-04 DIAGNOSIS — K769 Liver disease, unspecified: Secondary | ICD-10-CM | POA: Insufficient documentation

## 2023-12-04 DIAGNOSIS — K760 Fatty (change of) liver, not elsewhere classified: Secondary | ICD-10-CM | POA: Diagnosis not present

## 2023-12-04 DIAGNOSIS — N281 Cyst of kidney, acquired: Secondary | ICD-10-CM | POA: Diagnosis not present

## 2023-12-04 DIAGNOSIS — K7469 Other cirrhosis of liver: Secondary | ICD-10-CM | POA: Diagnosis not present

## 2023-12-04 DIAGNOSIS — K766 Portal hypertension: Secondary | ICD-10-CM | POA: Diagnosis not present

## 2023-12-04 MED ORDER — GADOBUTROL 1 MMOL/ML IV SOLN
7.3000 mL | Freq: Once | INTRAVENOUS | Status: AC | PRN
  Administered 2023-12-04: 7.3 mL via INTRAVENOUS

## 2023-12-17 ENCOUNTER — Inpatient Hospital Stay: Payer: 59 | Admitting: Oncology

## 2023-12-17 NOTE — Progress Notes (Deleted)
Hematology-Oncology Clinic Note  Toma Deiters, MD   Reason for Referral: Liver lesions  Oncology History: I have reviewed her chart and materials related to her cancer extensively and collaborated history with the patient. Summary of oncologic history is as follows: Oncology History  Liver lesion  10/19/2023 Imaging   Ultrasound abdomen: Liver: Diffusely echogenic with irregular, nodular contours.  The previously demonstrated 3.2 cm left lobe liver mass currently measures 2.2-1.8 and 1.6 cm.  Interval 1.3 and 1.1 into 0.8 cm similar-appearing mass more laterally in the lateral segment of the left lobe of the liver.  Interval 1.6 and 1.5 into 1.3 cm oval, heterogeneous mass more anteriorly in the liver on the right.  Portal vein is patent on color Doppler imaging with normal direction of blood flow to the liver   11/11/2023 Initial Diagnosis   Liver lesion       History of Presenting Illness: Gabriella Leon 67 y.o. female is here because of newly diagnosed liver lesions.The patient presents for evaluation of three liver lesions identified on ultrasound by her primary care provider.  The patient has been experiencing abdominal pain for several months, which she describes as 'something sitting on my chest.' She also reports shortness of breath for the same duration. She denies any history of COPD or emphysema. She also denies any blood in her stools or bloody vomiting.  She denies nausea, vomiting, loss of appetite and weight loss.  She was doing well until the abdominal pain recently that led to the ultrasound.  She has no other complaints today.  The patient has a significant smoking history, consuming half a pack a day for many years. She also has a history of alcohol use, currently consuming a pint a day. She reports periods of abstinence lasting about two weeks, followed by relapses lasting about two days. The patient lives alone and is less active around the house than she  used to be, but she is still able to cook for herself. She is retired from a career in home care.   Medical History: Past Medical History:  Diagnosis Date   Acid reflux disease with ulcer    Anxiety    Arthritis    Asthma    Blood dyscrasia    thrombocytopenia-next tx injection 09/25/15   Blood transfusion    CHF (congestive heart failure) (HCC) 2021   Chronic kidney disease    Cirrhosis (HCC)    DDD (degenerative disc disease), lumbar    Depression    Diabetes mellitus    Dyspnea    Esophageal varices (HCC) 09/18/2014   Folate deficiency 03/28/2014   Noted on 03/23/2014.  Folate 1 mg prescribed.   Gallstones    GERD (gastroesophageal reflux disease)    Headache    migraines   Helicobacter pylori ab+    July 2010, s/p treatment   Hepatitis B antibody positive    Hepatitis C    and B positive antibody   History of alcoholism (HCC)    History of kidney stones    Hypertension    Left leg pain    Liver cirrhosis (HCC)    Low back pain    Other pancytopenia (HCC) 10/27/2013   Ulcer    ?    Surgical history: Past Surgical History:  Procedure Laterality Date   ANTERIOR CERVICAL DECOMP/DISCECTOMY FUSION N/A 09/28/2015   Procedure: Cervical three-four Anterior cervical decompression/diskectomy/fusion;  Surgeon: Hilda Lias, MD;  Location: [facility];  Service: Neurosurgery;  Laterality: N/A;  C3-4 Anterior cervical decompression/diskectomy/fusion   APPENDECTOMY     age 67   BIOPSY  12/18/2011       BIOPSY  05/19/2019   Procedure: BIOPSY;  Surgeon: Corbin Ade, MD;  Location: [facility];  Service: Endoscopy;;  gastric   CESAREAN SECTION  1988   [facility]   CHOLECYSTECTOMY  greater than 10 yrs   [facility]   COLONOSCOPY  12/18/11   minimal anal canal hemorrhoids, friable rectal and colonic mucosa, left-sided diverticulosis, repeat in 2022.    ESOPHAGOGASTRODUODENOSCOPY  12/18/11   3 columns of Grade II esophageal varices, reflux esohpagitis, portal gastropathy, path with  chronic gastritis, negative H.pylori, surveillance in June 2014   ESOPHAGOGASTRODUODENOSCOPY (EGD) WITH PROPOFOL N/A 04/13/2014   Dr. Jena Gauss: grade 1-2 varices, hiatal hernia, portal gastropathy. NO need for further surveillance while on prophylaxis unless evidence of bleeding   ESOPHAGOGASTRODUODENOSCOPY (EGD) WITH PROPOFOL N/A 05/19/2019   erosive reflux esophagitis, Grade 1-2 esophageal varices with patulous EG junction. Hiatal hernia. Portal gastropathy. Gastric and duodenal dulcer disease.    MULTIPLE EXTRACTIONS WITH ALVEOLOPLASTY  03/08/2012   Procedure: MULTIPLE EXTRACION WITH ALVEOLOPLASTY;  Surgeon: Georgia Lopes, DDS;  Location: [facility];  Service: Oral Surgery;  Laterality: N/A;   TOTAL HIP ARTHROPLASTY Left 04/30/2021   Procedure: TOTAL HIP ARTHROPLASTY ANTERIOR APPROACH;  Surgeon: Durene Romans, MD;  Location: [facility];  Service: Orthopedics;  Laterality: Left;    Social History: Social History   Socioeconomic History   Marital status: Legally Separated    Spouse name: Not on file   Number of children: 1   Years of education: 10th grade   Highest education level: Not on file  Occupational History   Occupation: disabled    Employer: NOT EMPLOYED  Tobacco Use   Smoking status: Every Day    Current packs/day: 0.25    Average packs/day: 0.3 packs/day for 20.0 years (5.0 ttl pk-yrs)    Types: Cigarettes   Smokeless tobacco: Never  Vaping Use   Vaping status: Never Used  Substance and Sexual Activity   Alcohol use: Yes    Comment: occasionally   Drug use: No   Sexual activity: Yes    Birth control/protection: Post-menopausal  Other Topics Concern   Not on file  Social History Narrative   Lives at home alone.   Right-handed.   Occasional caffeine use.   Social Drivers of Corporate investment banker Strain: Not on file  Food Insecurity: No Food Insecurity (11/11/2023)   Hunger Vital Sign    Worried About Running Out of Food in the Last Year: Never true    Ran Out of  Food in the Last Year: Never true  Transportation Needs: Unmet Transportation Needs (11/11/2023)   PRAPARE - Administrator, Civil Service (Medical): Yes    Lack of Transportation (Non-Medical): Yes  Physical Activity: Not on file  Stress: Not on file  Social Connections: Not on file  Intimate Partner Violence: Not At Risk (11/11/2023)   Humiliation, Afraid, Rape, and Kick questionnaire    Fear of Current or Ex-Partner: No    Emotionally Abused: No    Physically Abused: No    Sexually Abused: No    Family History: Family History  Problem Relation Age of Onset   Diabetes Mother    Heart disease Mother    Cancer Paternal Uncle        passed away age 20   Other Father  died in MVA in 1990   Heart disease Sister    Anesthesia problems Neg Hx    Hypotension Neg Hx    Malignant hyperthermia Neg Hx    Pseudochol deficiency Neg Hx    Colon cancer Neg Hx    Liver disease Neg Hx     Allergies:  has no known allergies.  Medications:  Current Outpatient Medications  Medication Sig Dispense Refill   famotidine (PEPCID) 20 MG tablet Take 20 mg by mouth 2 (two) times daily.      lisinopril-hydrochlorothiazide (PRINZIDE,ZESTORETIC) 10-12.5 MG per tablet Take 1 tablet by mouth daily.     metFORMIN (GLUCOPHAGE) 500 MG tablet Take 500 mg by mouth daily with breakfast.     methocarbamol (ROBAXIN) 500 MG tablet Take 1 tablet (500 mg total) by mouth every 6 (six) hours as needed for muscle spasms. 40 tablet 0   nadolol (CORGARD) 40 MG tablet Take 40 mg by mouth daily.      oxyCODONE (OXY IR/ROXICODONE) 5 MG immediate release tablet Take 1-2 tablets (5-10 mg total) by mouth every 6 (six) hours as needed for severe pain (pain 7-10). 42 tablet 0   pantoprazole (PROTONIX) 40 MG tablet Take 40 mg by mouth 2 (two) times daily.     polyethylene glycol (MIRALAX / GLYCOLAX) 17 g packet Take 17 g by mouth daily as needed for mild constipation. 14 each 0   spironolactone (ALDACTONE) 25  MG tablet TAKE 1 TABLET BY MOUTH DAILY 90 tablet 1   thiamine (VITAMIN B-1) 100 MG tablet Take 100 mg by mouth daily.     Vitamin D, Ergocalciferol, (DRISDOL) 1.25 MG (50000 UNIT) CAPS capsule TAKE ONE CAPSULE BY MOUTH ONCE a WEEK 12 capsule 2   No current facility-administered medications for this visit.    Review of Systems: ROS  Physical Examination: ECOG PERFORMANCE STATUS: 1 - Symptomatic but completely ambulatory  There were no vitals filed for this visit.  There were no vitals filed for this visit.  Physical Exam  Laboratory Data: I have reviewed the data as listed Lab Results  Component Value Date   WBC 2.7 (L) 11/11/2023   HGB 10.6 (L) 11/11/2023   HCT 32.0 (L) 11/11/2023   MCV 104.9 (H) 11/11/2023   PLT 80 (L) 11/11/2023   Recent Labs    11/11/23 1521  NA 139  K 3.8  CL 107  CO2 22  GLUCOSE 104*  BUN 15  CREATININE 1.18*  CALCIUM 9.1  GFRNONAA 51*  PROT 7.5  ALBUMIN 3.2*  AST 41  ALT 20  ALKPHOS 161*  BILITOT 2.1*    ASSESSMENT & PLAN:  Patient is a 68 year old female with past medical history of smoking and alcohol use referred for newly diagnosed liver lesions on abdominal ultrasound   1. Liver lesion (Primary) Three lesions identified on ultrasound. Discussed the need for further workup to determine if these are malignant. -Order MRI of liver to better characterize lesions. -Order blood work including tumor markers-AFP. -Discuss results and next steps in a couple of weeks.  2. Alcohol use History of heavy drinking with recent attempts at sobriety. -Encourage continued efforts towards sobriety.  3. Tobacco use Half a pack per day for many years. Discussed the exacerbating effect of smoking on any potential cancer diagnosis. -Advise patient to consider quitting smoking.    No problem-specific Assessment & Plan notes found for this encounter.   No orders of the defined types were placed in this encounter.   The total  time spent in the  appointment was 60 minutes encounter with patients including review of chart and various tests results, discussions about plan of care and coordination of care plan   All questions were answered. The patient knows to call the clinic with any problems, questions or concerns. No barriers to learning was detected.  Mauro Kaufmann, NP 12/26/20249:45 AM

## 2023-12-24 ENCOUNTER — Inpatient Hospital Stay: Payer: 59 | Attending: Oncology | Admitting: Oncology

## 2023-12-24 VITALS — BP 135/71 | HR 69 | Temp 97.7°F | Resp 17 | Wt 168.7 lb

## 2023-12-24 DIAGNOSIS — F1090 Alcohol use, unspecified, uncomplicated: Secondary | ICD-10-CM | POA: Insufficient documentation

## 2023-12-24 DIAGNOSIS — K769 Liver disease, unspecified: Secondary | ICD-10-CM | POA: Insufficient documentation

## 2023-12-24 DIAGNOSIS — Z809 Family history of malignant neoplasm, unspecified: Secondary | ICD-10-CM | POA: Diagnosis not present

## 2023-12-24 DIAGNOSIS — Z720 Tobacco use: Secondary | ICD-10-CM | POA: Diagnosis not present

## 2023-12-24 MED ORDER — OXYCODONE HCL 5 MG PO TABS: 5.0000 mg | ORAL_TABLET | Freq: Four times a day (QID) | ORAL | 0 refills | Status: DC | PRN

## 2023-12-24 NOTE — Progress Notes (Signed)
 Hematology-Oncology Clinic Note  Orpha Yancey LABOR, MD   Reason for Referral: Liver lesions  Oncology History: I have reviewed her chart and materials related to her cancer extensively and collaborated history with the patient. Summary of oncologic history is as follows: Oncology History  Liver lesion  10/19/2023 Imaging   Ultrasound abdomen: Liver: Diffusely echogenic with irregular, nodular contours.  The previously demonstrated 3.2 cm left lobe liver mass currently measures 2.2-1.8 and 1.6 cm.  Interval 1.3 and 1.1 into 0.8 cm similar-appearing mass more laterally in the lateral segment of the left lobe of the liver.  Interval 1.6 and 1.5 into 1.3 cm oval, heterogeneous mass more anteriorly in the liver on the right.  Portal vein is patent on color Doppler imaging with normal direction of blood flow to the liver   11/11/2023 Initial Diagnosis   Liver lesion       History of Presenting Illness: Gabriella Leon 68 y.o. female is here to review most recent MRI of her abdomen.  She was found to have several liver lesions that were identified by ultrasound by her PCP.  Reportedly had been experiencing abdominal pain and shortness of breath for several months.  Otherwise, she felt pretty good.  Today, she reports increasing abdominal pain and abdominal swelling.  She was prescribed oxycodone  but has run out.  She is currently not taking anything for her abdominal pain.  Reports she also has seen some dark blood in her stools.  The patient has a significant smoking history, consuming half a pack a day for many years. She also has a history of alcohol use, currently consuming a pint a day.    Medical History: Past Medical History:  Diagnosis Date   Acid reflux disease with ulcer    Anxiety    Arthritis    Asthma    Blood dyscrasia    thrombocytopenia-next tx injection 09/25/15   Blood transfusion    CHF (congestive heart failure) (HCC) 2021   Chronic kidney disease     Cirrhosis (HCC)    DDD (degenerative disc disease), lumbar    Depression    Diabetes mellitus    Dyspnea    Esophageal varices (HCC) 09/18/2014   Folate deficiency 03/28/2014   Noted on 03/23/2014.  Folate 1 mg prescribed.   Gallstones    GERD (gastroesophageal reflux disease)    Headache    migraines   Helicobacter pylori ab+    July 2010, s/p treatment   Hepatitis B antibody positive    Hepatitis C    and B positive antibody   History of alcoholism (HCC)    History of kidney stones    Hypertension    Left leg pain    Liver cirrhosis (HCC)    Low back pain    Other pancytopenia (HCC) 10/27/2013   Ulcer    ?    Surgical history: Past Surgical History:  Procedure Laterality Date   ANTERIOR CERVICAL DECOMP/DISCECTOMY FUSION N/A 09/28/2015   Procedure: Cervical three-four Anterior cervical decompression/diskectomy/fusion;  Surgeon: Catalina Stains, MD;  Location: [facility];  Service: Neurosurgery;  Laterality: N/A;  C3-4 Anterior cervical decompression/diskectomy/fusion   APPENDECTOMY     age 89   BIOPSY  12/18/2011       BIOPSY  05/19/2019   Procedure: BIOPSY;  Surgeon: Shaaron Lamar HERO, MD;  Location: [facility];  Service: Endoscopy;;  gastric   CESAREAN SECTION  1988   [facility]   CHOLECYSTECTOMY  greater than 10 yrs  [facility]   COLONOSCOPY  12/18/11   minimal anal canal hemorrhoids, friable rectal and colonic mucosa, left-sided diverticulosis, repeat in December 27, 2021.    ESOPHAGOGASTRODUODENOSCOPY  12/18/11   3 columns of Grade II esophageal varices, reflux esohpagitis, portal gastropathy, path with chronic gastritis, negative H.pylori, surveillance in June 2014   ESOPHAGOGASTRODUODENOSCOPY (EGD) WITH PROPOFOL  N/A 04/13/2014   Dr. Shaaron: grade 1-2 varices, hiatal hernia, portal gastropathy. NO need for further surveillance while on prophylaxis unless evidence of bleeding   ESOPHAGOGASTRODUODENOSCOPY (EGD) WITH PROPOFOL  N/A 05/19/2019   erosive reflux esophagitis, Grade 1-2 esophageal  varices with patulous EG junction. Hiatal hernia. Portal gastropathy. Gastric and duodenal dulcer disease.    MULTIPLE EXTRACTIONS WITH ALVEOLOPLASTY  03/08/2012   Procedure: MULTIPLE EXTRACION WITH ALVEOLOPLASTY;  Surgeon: Glendia CHRISTELLA Primrose, DDS;  Location: [facility];  Service: Oral Surgery;  Laterality: N/A;   TOTAL HIP ARTHROPLASTY Left 04/30/2021   Procedure: TOTAL HIP ARTHROPLASTY ANTERIOR APPROACH;  Surgeon: Ernie Cough, MD;  Location: [facility];  Service: Orthopedics;  Laterality: Left;    Social History: Social History   Socioeconomic History   Marital status: Legally Separated    Spouse name: Not on file   Number of children: 1   Years of education: 10th grade   Highest education level: Not on file  Occupational History   Occupation: disabled    Employer: NOT EMPLOYED  Tobacco Use   Smoking status: Every Day    Current packs/day: 0.25    Average packs/day: 0.3 packs/day for 20.0 years (5.0 ttl pk-yrs)    Types: Cigarettes   Smokeless tobacco: Never  Vaping Use   Vaping status: Never Used  Substance and Sexual Activity   Alcohol use: Yes    Comment: occasionally   Drug use: No   Sexual activity: Yes    Birth control/protection: Post-menopausal  Other Topics Concern   Not on file  Social History Narrative   Lives at home alone.   Right-handed.   Occasional caffeine use.   Social Drivers of Corporate Investment Banker Strain: Not on file  Food Insecurity: No Food Insecurity (11/11/2023)   Hunger Vital Sign    Worried About Running Out of Food in the Last Year: Never true    Ran Out of Food in the Last Year: Never true  Transportation Needs: Unmet Transportation Needs (11/11/2023)   PRAPARE - Administrator, Civil Service (Medical): Yes    Lack of Transportation (Non-Medical): Yes  Physical Activity: Not on file  Stress: Not on file  Social Connections: Not on file  Intimate Partner Violence: Not At Risk (11/11/2023)   Humiliation, Afraid, Rape, and Kick  questionnaire    Fear of Current or Ex-Partner: No    Emotionally Abused: No    Physically Abused: No    Sexually Abused: No    Family History: Family History  Problem Relation Age of Onset   Diabetes Mother    Heart disease Mother    Cancer Paternal Uncle        passed away age 55   Other Father        died in MVA in December 27, 1989   Heart disease Sister    Anesthesia problems Neg Hx    Hypotension Neg Hx    Malignant hyperthermia Neg Hx    Pseudochol deficiency Neg Hx    Colon cancer Neg Hx    Liver disease Neg Hx     Allergies:  has no known allergies.  Medications:  Current Outpatient  Medications  Medication Sig Dispense Refill   famotidine  (PEPCID ) 20 MG tablet Take 20 mg by mouth 2 (two) times daily.      lisinopril -hydrochlorothiazide  (PRINZIDE ,ZESTORETIC ) 10-12.5 MG per tablet Take 1 tablet by mouth daily.     metFORMIN  (GLUCOPHAGE ) 500 MG tablet Take 500 mg by mouth daily with breakfast.     methocarbamol  (ROBAXIN ) 500 MG tablet Take 1 tablet (500 mg total) by mouth every 6 (six) hours as needed for muscle spasms. 40 tablet 0   nadolol  (CORGARD ) 40 MG tablet Take 40 mg by mouth daily.      naltrexone  (DEPADE) 50 MG tablet Take 50 mg by mouth daily.     pantoprazole  (PROTONIX ) 40 MG tablet Take 40 mg by mouth 2 (two) times daily.     polyethylene glycol (MIRALAX  / GLYCOLAX ) 17 g packet Take 17 g by mouth daily as needed for mild constipation. 14 each 0   solifenacin (VESICARE) 5 MG tablet Take 5 mg by mouth daily.     spironolactone  (ALDACTONE ) 25 MG tablet TAKE 1 TABLET BY MOUTH DAILY 90 tablet 1   thiamine  (VITAMIN B-1) 100 MG tablet Take 100 mg by mouth daily.     Vitamin D , Ergocalciferol , (DRISDOL ) 1.25 MG (50000 UNIT) CAPS capsule TAKE ONE CAPSULE BY MOUTH ONCE a WEEK 12 capsule 2   oxyCODONE  (OXY IR/ROXICODONE ) 5 MG immediate release tablet Take 1-2 tablets (5-10 mg total) by mouth every 6 (six) hours as needed for severe pain (pain score 7-10) (pain 7-10). 60 tablet 0    No current facility-administered medications for this visit.    Review of Systems: Review of Systems  Constitutional:  Positive for malaise/fatigue.  Respiratory:  Positive for cough and shortness of breath.   Gastrointestinal:  Positive for abdominal pain and blood in stool. Negative for constipation, diarrhea, nausea and vomiting.  Neurological:  Positive for weakness.    Physical Examination: ECOG PERFORMANCE STATUS: 1 - Symptomatic but completely ambulatory  Vitals:   12/24/23 1524  BP: 135/71  Pulse: 69  Resp: 17  Temp: 97.7 F (36.5 C)  SpO2: 100%    Filed Weights   12/24/23 1524  Weight: 168 lb 10.4 oz (76.5 kg)    Physical Exam Constitutional:      Appearance: Normal appearance.  Abdominal:     General: There is distension.     Palpations: There is hepatomegaly. There is no splenomegaly.     Tenderness: There is abdominal tenderness in the right upper quadrant and epigastric area.  Neurological:     Mental Status: She is alert.     Laboratory Data: I have reviewed the data as listed Lab Results  Component Value Date   WBC 2.7 (L) 11/11/2023   HGB 10.6 (L) 11/11/2023   HCT 32.0 (L) 11/11/2023   MCV 104.9 (H) 11/11/2023   PLT 80 (L) 11/11/2023   Recent Labs    11/11/23 1521  NA 139  K 3.8  CL 107  CO2 22  GLUCOSE 104*  BUN 15  CREATININE 1.18*  CALCIUM 9.1  GFRNONAA 51*  PROT 7.5  ALBUMIN  3.2*  AST 41  ALT 20  ALKPHOS 161*  BILITOT 2.1*    ASSESSMENT & PLAN:  Patient is a 68 year old female with past medical history of smoking and alcohol use referred for newly diagnosed liver lesions on abdominal ultrasound   1. Liver lesion (Primary) - Patient reports having abdominal pain for several months.  Had abdominal ultrasound which revealed three liver lesions.  He was recommended she have an MRI to better characterize. -MRI from 12/04/2023 revealed arterially hyperenhancing subscapular lesion of the anterior right lobe of the liver,  hepatic segment measuring 1.6 x 1.3 cm with evidence of washout and capsular enhancement consistent with hepatocellular carcinoma BI-RADS Category 5.  Additional arterially hyperenhancing lesion of posterior left lobe of the liver hepatic segment 2 measuring 1.9 x 1.7 cm consistent with hepatocellular carcinoma.  Small enhancing lesion inferiorly measuring 0.8 cm, suspicion for an additional small hepatocellular carcinoma.  Cirrhosis and portal hypertension including small gastroesophageal varices. -Discussed plan with Joesph Bohr, RN for Dr. Jillian recommends either PET/CT or CT CAP ASAP and follow-up to discuss treatment moving forward. -Will also send RX prescription for oxycodone  IR 5-10 mg every 6-8 hours.  Patient has tolerated this medication well in the past.   2. Alcohol use History of heavy drinking with recent attempts at sobriety. -Encourage continued efforts towards sobriety.  3. Tobacco use Half a pack per day for many years. Discussed the exacerbating effect of smoking on any potential cancer diagnosis. -Advise patient to consider quitting smoking.  PLAN SUMMARY: >> Recommend CT CAP ASAP. >> Follow-up shortly after to review findings and discuss treatment plan moving forward. >> New Rx sent in for Oxy IR 5 to 10 mg every 6-8 hours for abdominal pain.       No problem-specific Assessment & Plan notes found for this encounter.   Orders Placed This Encounter  Procedures   CT CHEST ABDOMEN PELVIS W CONTRAST    Standing Status:   Future    Expiration Date:   12/23/2024    If indicated for the ordered procedure, I authorize the administration of contrast media per Radiology protocol:   Yes    Does the patient have a contrast media/X-ray dye allergy?:   Yes    Preferred imaging location?:   [facility]    If indicated for the ordered procedure, I authorize the administration of oral contrast media per Radiology protocol:   Yes   I spent 20 minutes dedicated to the  care of this patient (face-to-face and non-face-to-face) on the date of the encounter to include what is described in the assessment and plan.  Delon FORBES Hope, NP 1/2/20253:34 PM

## 2023-12-28 ENCOUNTER — Ambulatory Visit (HOSPITAL_COMMUNITY): Payer: 59

## 2024-01-01 DIAGNOSIS — H3589 Other specified retinal disorders: Secondary | ICD-10-CM | POA: Diagnosis not present

## 2024-01-03 DIAGNOSIS — R0600 Dyspnea, unspecified: Secondary | ICD-10-CM | POA: Diagnosis not present

## 2024-01-03 DIAGNOSIS — I700 Atherosclerosis of aorta: Secondary | ICD-10-CM | POA: Diagnosis not present

## 2024-01-03 DIAGNOSIS — K449 Diaphragmatic hernia without obstruction or gangrene: Secondary | ICD-10-CM | POA: Diagnosis not present

## 2024-01-03 DIAGNOSIS — I110 Hypertensive heart disease with heart failure: Secondary | ICD-10-CM | POA: Diagnosis not present

## 2024-01-03 DIAGNOSIS — Z79899 Other long term (current) drug therapy: Secondary | ICD-10-CM | POA: Diagnosis not present

## 2024-01-03 DIAGNOSIS — R002 Palpitations: Secondary | ICD-10-CM | POA: Diagnosis not present

## 2024-01-03 DIAGNOSIS — Z7984 Long term (current) use of oral hypoglycemic drugs: Secondary | ICD-10-CM | POA: Diagnosis not present

## 2024-01-03 DIAGNOSIS — E119 Type 2 diabetes mellitus without complications: Secondary | ICD-10-CM | POA: Diagnosis not present

## 2024-01-03 DIAGNOSIS — I509 Heart failure, unspecified: Secondary | ICD-10-CM | POA: Diagnosis not present

## 2024-01-03 DIAGNOSIS — F17200 Nicotine dependence, unspecified, uncomplicated: Secondary | ICD-10-CM | POA: Diagnosis not present

## 2024-01-03 DIAGNOSIS — I493 Ventricular premature depolarization: Secondary | ICD-10-CM | POA: Diagnosis not present

## 2024-01-03 DIAGNOSIS — J45909 Unspecified asthma, uncomplicated: Secondary | ICD-10-CM | POA: Diagnosis not present

## 2024-01-03 DIAGNOSIS — Z7951 Long term (current) use of inhaled steroids: Secondary | ICD-10-CM | POA: Diagnosis not present

## 2024-01-03 DIAGNOSIS — R0789 Other chest pain: Secondary | ICD-10-CM | POA: Diagnosis not present

## 2024-01-03 DIAGNOSIS — K219 Gastro-esophageal reflux disease without esophagitis: Secondary | ICD-10-CM | POA: Diagnosis not present

## 2024-01-04 ENCOUNTER — Inpatient Hospital Stay: Payer: 59 | Admitting: Hematology

## 2024-01-04 DIAGNOSIS — R002 Palpitations: Secondary | ICD-10-CM | POA: Diagnosis not present

## 2024-01-04 DIAGNOSIS — K449 Diaphragmatic hernia without obstruction or gangrene: Secondary | ICD-10-CM | POA: Diagnosis not present

## 2024-01-04 DIAGNOSIS — I700 Atherosclerosis of aorta: Secondary | ICD-10-CM | POA: Diagnosis not present

## 2024-01-04 DIAGNOSIS — H5213 Myopia, bilateral: Secondary | ICD-10-CM | POA: Diagnosis not present

## 2024-01-06 ENCOUNTER — Ambulatory Visit: Payer: 59 | Admitting: Hematology

## 2024-01-07 ENCOUNTER — Encounter: Payer: Self-pay | Admitting: Hematology

## 2024-01-12 ENCOUNTER — Ambulatory Visit (HOSPITAL_COMMUNITY): Payer: 59

## 2024-01-15 ENCOUNTER — Ambulatory Visit (HOSPITAL_COMMUNITY)
Admission: RE | Admit: 2024-01-15 | Discharge: 2024-01-15 | Disposition: A | Discharge status: Home / Self Care | Payer: 59 | Source: Ambulatory Visit | Attending: Oncology | Admitting: Oncology

## 2024-01-15 DIAGNOSIS — K769 Liver disease, unspecified: Secondary | ICD-10-CM

## 2024-01-15 DIAGNOSIS — I2510 Atherosclerotic heart disease of native coronary artery without angina pectoris: Secondary | ICD-10-CM | POA: Insufficient documentation

## 2024-01-15 DIAGNOSIS — I700 Atherosclerosis of aorta: Secondary | ICD-10-CM | POA: Insufficient documentation

## 2024-01-15 DIAGNOSIS — K5730 Diverticulosis of large intestine without perforation or abscess without bleeding: Secondary | ICD-10-CM | POA: Diagnosis not present

## 2024-01-15 DIAGNOSIS — C220 Liver cell carcinoma: Secondary | ICD-10-CM | POA: Diagnosis not present

## 2024-01-15 DIAGNOSIS — K760 Fatty (change of) liver, not elsewhere classified: Secondary | ICD-10-CM | POA: Diagnosis not present

## 2024-01-15 DIAGNOSIS — K7460 Unspecified cirrhosis of liver: Secondary | ICD-10-CM | POA: Insufficient documentation

## 2024-01-15 MED ORDER — IOHEXOL 300 MG/ML  SOLN
80.0000 mL | Freq: Once | INTRAMUSCULAR | Status: AC | PRN
  Administered 2024-01-15: 80 mL via INTRAVENOUS

## 2024-01-15 NOTE — Progress Notes (Signed)
I believe you see her next week.  Just FYI.   Durenda Hurt, NP 01/15/2024 1:15 PM

## 2024-01-18 ENCOUNTER — Inpatient Hospital Stay: Payer: 59

## 2024-01-18 ENCOUNTER — Inpatient Hospital Stay (HOSPITAL_BASED_OUTPATIENT_CLINIC_OR_DEPARTMENT_OTHER): Payer: 59 | Admitting: Hematology

## 2024-01-18 VITALS — BP 123/62 | HR 60 | Temp 98.3°F | Resp 16 | Wt 171.7 lb

## 2024-01-18 DIAGNOSIS — Z720 Tobacco use: Secondary | ICD-10-CM | POA: Diagnosis not present

## 2024-01-18 DIAGNOSIS — K769 Liver disease, unspecified: Secondary | ICD-10-CM

## 2024-01-18 DIAGNOSIS — D508 Other iron deficiency anemias: Secondary | ICD-10-CM | POA: Diagnosis not present

## 2024-01-18 DIAGNOSIS — Z809 Family history of malignant neoplasm, unspecified: Secondary | ICD-10-CM | POA: Diagnosis not present

## 2024-01-18 LAB — RETICULOCYTES
Immature Retic Fract: 11.2 % (ref 2.3–15.9)
RBC.: 3.04 MIL/uL — ABNORMAL LOW (ref 3.87–5.11)
Retic Count, Absolute: 93 10*3/uL (ref 19.0–186.0)
Retic Ct Pct: 3.1 % (ref 0.4–3.1)

## 2024-01-18 LAB — IRON AND TIBC
Iron: 144 ug/dL (ref 28–170)
Saturation Ratios: 43 % — ABNORMAL HIGH (ref 10.4–31.8)
TIBC: 339 ug/dL (ref 250–450)
UIBC: 195 ug/dL

## 2024-01-18 LAB — VITAMIN B12: Vitamin B-12: 611 pg/mL (ref 180–914)

## 2024-01-18 LAB — FOLATE: Folate: 6.1 ng/mL (ref >5.9)

## 2024-01-18 LAB — FERRITIN: Ferritin: 304 ng/mL (ref 11–307)

## 2024-01-18 NOTE — Progress Notes (Signed)
[facility] [address], [place] [address]    Clinic Day:  01/18/2024  Referring physician: Toma Deiters, MD  Patient Care Team: Toma Deiters, MD as PCP - General (Internal Medicine) Wyline Mood Dorothe Pea, MD as PCP - Cardiology (Cardiology) Corbin Ade, MD (Gastroenterology) Case, Helen Hashimoto, MD as Attending Physician (Orthopedic Surgery) Cindie Crumbly, MD as Medical Oncologist (Medical Oncology) Therese Sarah, RN as Oncology Nurse Navigator (Medical Oncology)   ASSESSMENT & PLAN:   Assessment: 1.  Multifocal HCC: - MRI liver (12/04/2023): Arterially hyperenhancing subcapsular lesion of the anterior right lobe of the liver, segment 8, measuring 1.6 x 1.3 cm, LI-RADS 5.  Additional hyperenhancing lesion of posterior left lobe of the liver, segment 2/3 measuring 1.9 x 1.7 cm, LI-RADS 5.  Small hyperenhancing lesion inferiorly in segment 3 without evidence of washout measuring 0.8 cm, LI-RADS 3. - CT CAP (01/16/2024): Cirrhosis with hepatic steatosis.  No evidence of lymphadenopathy or metastatic disease in the chest, abdomen and pelvis.   2.  Hepatitis C and cirrhosis: - Previous hep C, status posttreatment with documented SVR. - Hemochromatosis labs done previously were negative for elevated ferritin levels.   3.  Tobacco abuse: -Current active smoker, smokes half to 1 pack/day for the last 36 years. -Low-dose CT scan done on 11/29/2019 was lung RADS 2 follow-up in 1 year.  Plan: 1.  Multifocal HCC: - We discussed findings on the MRI of the liver and her new diagnosis in detail. - We also reviewed CT CAP from 01/15/2024: No evidence of metastatic disease. - Last AFP in November was normal.  Will repeat AFP today. - Discussed about locoregional therapy options.  Will make referral to IR. - RTC 6 to 8 weeks for follow-up.   2.  Macrocytic anemia: - This is from combination of CKD, liver disease. - Will check ferritin, iron panel today along  with B12 and folic acid levels.  Would recommend parenteral iron therapy if needed.     Orders Placed This Encounter  Procedures   IR Radiologist Eval & Mgmt    Standing Status:   Future    Expected Date:   01/25/2024    Expiration Date:   01/17/2025    Reason for Exam (SYMPTOM  OR DIAGNOSIS REQUIRED):   hepatocellular carcinoma    Preferred Imaging Location?:   [facility]   Iron and TIBC ([facility] ONLY)    Standing Status:   Future    Number of Occurrences:   1    Expected Date:   01/18/2024    Expiration Date:   01/17/2025   Ferritin    Standing Status:   Future    Number of Occurrences:   1    Expected Date:   01/18/2024    Expiration Date:   01/17/2025   Vitamin B12    Standing Status:   Future    Number of Occurrences:   1    Expected Date:   01/18/2024    Expiration Date:   01/17/2025   Folate    Standing Status:   Future    Number of Occurrences:   1    Expected Date:   01/18/2024    Expiration Date:   01/17/2025   Methylmalonic acid, serum    Standing Status:   Future    Number of Occurrences:   1    Expected Date:   01/18/2024    Expiration Date:   01/17/2025  Reticulocytes    Standing Status:   Future    Number of Occurrences:   1    Expected Date:   01/18/2024    Expiration Date:   01/17/2025   AFP tumor marker    Standing Status:   Future    Number of Occurrences:   1    Expected Date:   01/18/2024    Expiration Date:   01/17/2025      Gabriella Leon,acting as a scribe for Doreatha Massed, MD.,have documented all relevant documentation on the behalf of Doreatha Massed, MD,as directed by  Doreatha Massed, MD while in the presence of Doreatha Massed, MD.   I, Doreatha Massed MD, have reviewed the above documentation for accuracy and completeness, and I agree with the above.   Doreatha Massed, MD   1/27/20253:20 PM  CHIEF COMPLAINT:   Diagnosis: Multifocal HCC   Cancer Staging  No matching staging  information was found for the patient.    Prior Therapy: None  Current Therapy: Locoregional therapy   HISTORY OF PRESENT ILLNESS:   Oncology History  Liver lesion  10/19/2023 Imaging   Ultrasound abdomen: Liver: Diffusely echogenic with irregular, nodular contours.  The previously demonstrated 3.2 cm left lobe liver mass currently measures 2.2-1.8 and 1.6 cm.  Interval 1.3 and 1.1 into 0.8 cm similar-appearing mass more laterally in the lateral segment of the left lobe of the liver.  Interval 1.6 and 1.5 into 1.3 cm oval, heterogeneous mass more anteriorly in the liver on the right.  Portal vein is patent on color Doppler imaging with normal direction of blood flow to the liver   11/11/2023 Initial Diagnosis   Liver lesion      INTERVAL HISTORY:   Gabriella Leon is a 68 y.o. female presenting to clinic today for follow up of liver lesions. She was last seen by me on 03/26/21 and NP Burns on 12/24/23.  [name] was originally seen by me almost 2 years ago for pancytopenia, elevated ferritin levels, and Vitamin D deficiency. Today she is presenting with liver lesions found on a [place] ordered by her PCP. She most recently had a CT C/A/P done on 01/15/24 that found: cirrhosis and hepatic steatosis; similar appearance of enhancing lesions in the liver, consistent with multifocal hepatocellular carcinoma; no evidence of lymphadenopathy or metastatic disease in the chest, abdomen, or pelvis; diverticulosis of the terminal ileum, with adjacent fat stranding and wall thickening, consistent with small bowel diverticulitis; and diffuse urinary bladder wall thickening, consistent with nonspecific infectious or inflammatory cystitis.  MRI of the liver on 12/04/23 found: arterially hyperenhancing subcapsular lesion of the anterior right lobe of the liver, hepatic segment VIII, measuring 1.6 x 1.3 cm, with evidence of washout and capsular enhancement, consistent with hepatocellular carcinoma. Additional arterially  hyperenhancing lesion of posterior left lobe of the liver, hepatic segment II/III measuring 1.9 x 1.7 cm, also with evidence of washout and capsular enhancement, consistent with hepatocellular carcinoma. Smaller arterially enhancing lesion inferiorly in hepatic segment III, without evidence of washout or capsular enhancement, measuring 0.8 cm, intermediate suspicion for an additional small hepatocellular carcinoma.  Today, she states that she is doing well overall. Her appetite level is at 50%. Her energy level is at 50%.  PAST MEDICAL HISTORY:   Past Medical History: Past Medical History:  Diagnosis Date   Acid reflux disease with ulcer    Anxiety    Arthritis    Asthma    Blood dyscrasia    thrombocytopenia-next tx injection 09/25/15  Blood transfusion    CHF (congestive heart failure) (HCC) 2021   Chronic kidney disease    Cirrhosis (HCC)    DDD (degenerative disc disease), lumbar    Depression    Diabetes mellitus    Dyspnea    Esophageal varices (HCC) 09/18/2014   Folate deficiency 03/28/2014   Noted on 03/23/2014.  Folate 1 mg prescribed.   Gallstones    GERD (gastroesophageal reflux disease)    Headache    migraines   Helicobacter pylori ab+    July 2010, s/p treatment   Hepatitis B antibody positive    Hepatitis C    and B positive antibody   History of alcoholism (HCC)    History of kidney stones    Hypertension    Left leg pain    Liver cirrhosis (HCC)    Low back pain    Other pancytopenia (HCC) 10/27/2013   Ulcer    ?    Surgical History: Past Surgical History:  Procedure Laterality Date   ANTERIOR CERVICAL DECOMP/DISCECTOMY FUSION N/A 09/28/2015   Procedure: Cervical three-four Anterior cervical decompression/diskectomy/fusion;  Surgeon: Hilda Lias, MD;  Location: [facility];  Service: Neurosurgery;  Laterality: N/A;  C3-4 Anterior cervical decompression/diskectomy/fusion   APPENDECTOMY     age 65   BIOPSY  12/18/2011       BIOPSY  05/19/2019    Procedure: BIOPSY;  Surgeon: Corbin Ade, MD;  Location: [facility];  Service: Endoscopy;;  gastric   CESAREAN SECTION  1988   [facility]   CHOLECYSTECTOMY  greater than 10 yrs   [facility]   COLONOSCOPY  12/18/11   minimal anal canal hemorrhoids, friable rectal and colonic mucosa, left-sided diverticulosis, repeat in 2022.    ESOPHAGOGASTRODUODENOSCOPY  12/18/11   3 columns of Grade II esophageal varices, reflux esohpagitis, portal gastropathy, path with chronic gastritis, negative H.pylori, surveillance in June 2014   ESOPHAGOGASTRODUODENOSCOPY (EGD) WITH PROPOFOL N/A 04/13/2014   Dr. Jena Gauss: grade 1-2 varices, hiatal hernia, portal gastropathy. NO need for further surveillance while on prophylaxis unless evidence of bleeding   ESOPHAGOGASTRODUODENOSCOPY (EGD) WITH PROPOFOL N/A 05/19/2019   erosive reflux esophagitis, Grade 1-2 esophageal varices with patulous EG junction. Hiatal hernia. Portal gastropathy. Gastric and duodenal dulcer disease.    MULTIPLE EXTRACTIONS WITH ALVEOLOPLASTY  03/08/2012   Procedure: MULTIPLE EXTRACION WITH ALVEOLOPLASTY;  Surgeon: Georgia Lopes, DDS;  Location: [facility] OR;  Service: Oral Surgery;  Laterality: N/A;   TOTAL HIP ARTHROPLASTY Left 04/30/2021   Procedure: TOTAL HIP ARTHROPLASTY ANTERIOR APPROACH;  Surgeon: Durene Romans, MD;  Location: [facility]S;  Service: Orthopedics;  Laterality: Left;    Social History: Social History   Socioeconomic History   Marital status: Legally Separated    Spouse name: Not on file   Number of children: 1   Years of education: 10th grade   Highest education level: Not on file  Occupational History   Occupation: disabled    Employer: NOT EMPLOYED  Tobacco Use   Smoking status: Every Day    Current packs/day: 0.25    Average packs/day: 0.3 packs/day for 20.0 years (5.0 ttl pk-yrs)    Types: Cigarettes   Smokeless tobacco: Never  Vaping Use   Vaping status: Never Used  Substance and Sexual Activity   Alcohol use: Yes     Comment: occasionally   Drug use: No   Sexual activity: Yes    Birth control/protection: Post-menopausal  Other Topics Concern   Not on file  Social History Narrative  Lives at home alone.   Right-handed.   Occasional caffeine use.   Social Drivers of Corporate investment banker Strain: Not on file  Food Insecurity: No Food Insecurity (11/11/2023)   Hunger Vital Sign    Worried About Running Out of Food in the Last Year: Never true    Ran Out of Food in the Last Year: Never true  Transportation Needs: Unmet Transportation Needs (11/11/2023)   PRAPARE - Administrator, Civil Service (Medical): Yes    Lack of Transportation (Non-Medical): Yes  Physical Activity: Not on file  Stress: Not on file  Social Connections: Not on file  Intimate Partner Violence: Not At Risk (11/11/2023)   Humiliation, Afraid, Rape, and Kick questionnaire    Fear of Current or Ex-Partner: No    Emotionally Abused: No    Physically Abused: No    Sexually Abused: No    Family History: Family History  Problem Relation Age of Onset   Diabetes Mother    Heart disease Mother    Cancer Paternal Uncle        passed away age 26   Other Father        died in MVA in 1989/02/11   Heart disease Sister    Anesthesia problems Neg Hx    Hypotension Neg Hx    Malignant hyperthermia Neg Hx    Pseudochol deficiency Neg Hx    Colon cancer Neg Hx    Liver disease Neg Hx     Current Medications:  Current Outpatient Medications:    famotidine (PEPCID) 20 MG tablet, Take 20 mg by mouth 2 (two) times daily. , Disp: , Rfl:    lisinopril-hydrochlorothiazide (PRINZIDE,ZESTORETIC) 10-12.5 MG per tablet, Take 1 tablet by mouth daily., Disp: , Rfl:    metFORMIN (GLUCOPHAGE) 500 MG tablet, Take 500 mg by mouth daily with breakfast., Disp: , Rfl:    methocarbamol (ROBAXIN) 500 MG tablet, Take 1 tablet (500 mg total) by mouth every 6 (six) hours as needed for muscle spasms., Disp: 40 tablet, Rfl: 0   nadolol  (CORGARD) 40 MG tablet, Take 40 mg by mouth daily. , Disp: , Rfl:    naltrexone (DEPADE) 50 MG tablet, Take 50 mg by mouth daily., Disp: , Rfl:    oxyCODONE (OXY IR/ROXICODONE) 5 MG immediate release tablet, Take 1-2 tablets (5-10 mg total) by mouth every 6 (six) hours as needed for severe pain (pain score 7-10) (pain 7-10)., Disp: 60 tablet, Rfl: 0   pantoprazole (PROTONIX) 40 MG tablet, Take 40 mg by mouth 2 (two) times daily., Disp: , Rfl:    polyethylene glycol (MIRALAX / GLYCOLAX) 17 g packet, Take 17 g by mouth daily as needed for mild constipation., Disp: 14 each, Rfl: 0   solifenacin (VESICARE) 5 MG tablet, Take 5 mg by mouth daily., Disp: , Rfl:    spironolactone (ALDACTONE) 25 MG tablet, TAKE 1 TABLET BY MOUTH DAILY, Disp: 90 tablet, Rfl: 1   thiamine (VITAMIN B-1) 100 MG tablet, Take 100 mg by mouth daily., Disp: , Rfl:    Vitamin D, Ergocalciferol, (DRISDOL) 1.25 MG (50000 UNIT) CAPS capsule, TAKE ONE CAPSULE BY MOUTH ONCE a WEEK, Disp: 12 capsule, Rfl: 2   Allergies: No Known Allergies  REVIEW OF SYSTEMS:   Review of Systems  Constitutional:  Negative for chills, fatigue and fever.  HENT:   Negative for lump/mass, mouth sores, nosebleeds, sore throat and trouble swallowing.   Eyes:  Negative for eye problems.  Respiratory:  Positive for cough and shortness of breath.   Cardiovascular:  Negative for chest pain, leg swelling and palpitations.  Gastrointestinal:  Positive for abdominal pain and nausea. Negative for constipation, diarrhea and vomiting.  Genitourinary:  Negative for bladder incontinence, difficulty urinating, dysuria, frequency, hematuria and nocturia.   Musculoskeletal:  Negative for arthralgias, back pain, flank pain, myalgias and neck pain.  Skin:  Negative for itching and rash.  Neurological:  Positive for numbness. Negative for dizziness and headaches.  Hematological:  Does not bruise/bleed easily.  Psychiatric/Behavioral:  Positive for sleep disturbance.  Negative for depression and suicidal ideas. The patient is not nervous/anxious.   All other systems reviewed and are negative.    VITALS:   Blood pressure 123/62, pulse 60, temperature 98.3 F (36.8 C), temperature source Oral, resp. rate 16, weight 171 lb 11.8 oz (77.9 kg), SpO2 99%.  Wt Readings from Last 3 Encounters:  01/18/24 171 lb 11.8 oz (77.9 kg)  12/24/23 168 lb 10.4 oz (76.5 kg)  11/11/23 172 lb 6.4 oz (78.2 kg)    Body mass index is 29.48 kg/m.  Performance status (ECOG): 1 - Symptomatic but completely ambulatory  PHYSICAL EXAM:   Physical Exam Vitals and nursing note reviewed. Exam conducted with a chaperone present.  Constitutional:      Appearance: Normal appearance.  Cardiovascular:     Rate and Rhythm: Normal rate and regular rhythm.     Pulses: Normal pulses.     Heart sounds: Normal heart sounds.  Pulmonary:     Effort: Pulmonary effort is normal.     Breath sounds: Normal breath sounds.  Abdominal:     Palpations: Abdomen is soft. There is no hepatomegaly, splenomegaly or mass.     Tenderness: There is no abdominal tenderness.  Musculoskeletal:     Right lower leg: No edema.     Left lower leg: No edema.  Lymphadenopathy:     Cervical: No cervical adenopathy.     Right cervical: No superficial, deep or posterior cervical adenopathy.    Left cervical: No superficial, deep or posterior cervical adenopathy.     Upper Body:     Right upper body: No supraclavicular or axillary adenopathy.     Left upper body: No supraclavicular or axillary adenopathy.  Neurological:     General: No focal deficit present.     Mental Status: She is alert and oriented to person, place, and time.  Psychiatric:        Mood and Affect: Mood normal.        Behavior: Behavior normal.     LABS:   CBC     Component Value Date/Time   WBC 2.7 (L) 11/11/2023 1521   RBC 3.04 (L) 01/18/2024 1451   RBC 3.05 (L) 11/11/2023 1521   HGB 10.6 (L) 11/11/2023 1521   HGB 11.6  04/14/2016 1326   HCT 32.0 (L) 11/11/2023 1521   HCT 34.2 (L) 04/14/2016 1326   PLT 80 (L) 11/11/2023 1521   PLT 49 (L) 04/14/2016 1326   MCV 104.9 (H) 11/11/2023 1521   MCV 102.4 (H) 04/14/2016 1326   MCH 34.8 (H) 11/11/2023 1521   MCHC 33.1 11/11/2023 1521   RDW 13.7 11/11/2023 1521   RDW 13.5 04/14/2016 1326   LYMPHSABS 0.9 11/11/2023 1521   LYMPHSABS 0.7 (L) 04/14/2016 1326   MONOABS 0.3 11/11/2023 1521   MONOABS 0.3 04/14/2016 1326   EOSABS 0.1 11/11/2023 1521   EOSABS 0.1 04/14/2016 1326   BASOSABS 0.0 11/11/2023 1521  BASOSABS 0.0 04/14/2016 1326    CMP      Component Value Date/Time   NA 139 11/11/2023 1521   NA 139 04/14/2016 1326   K 3.8 11/11/2023 1521   K 4.1 04/14/2016 1326   CL 107 11/11/2023 1521   CO2 22 11/11/2023 1521   CO2 27 04/14/2016 1326   GLUCOSE 104 (H) 11/11/2023 1521   GLUCOSE 135 04/14/2016 1326   BUN 15 11/11/2023 1521   BUN 13.5 04/14/2016 1326   CREATININE 1.18 (H) 11/11/2023 1521   CREATININE 1.10 (H) 06/15/2020 1646   CREATININE 1.2 (H) 04/14/2016 1326   CALCIUM 9.1 11/11/2023 1521   CALCIUM 9.4 04/14/2016 1326   PROT 7.5 11/11/2023 1521   PROT 7.6 04/14/2016 1326   ALBUMIN 3.2 (L) 11/11/2023 1521   ALBUMIN 3.6 04/14/2016 1326   AST 41 11/11/2023 1521   AST 50 (H) 04/14/2016 1326   ALT 20 11/11/2023 1521   ALT 24 04/14/2016 1326   ALKPHOS 161 (H) 11/11/2023 1521   ALKPHOS 138 04/14/2016 1326   BILITOT 2.1 (H) 11/11/2023 1521   BILITOT 1.97 (H) 04/14/2016 1326   GFRNONAA 51 (L) 11/11/2023 1521   GFRNONAA 54 (L) 06/15/2020 1646   GFRAA 56 (L) 07/26/2020 1430   GFRAA 63 06/15/2020 1646     No results found for: "CEA1", "CEA" / No results found for: "CEA1", "CEA" No results found for: "PSA1" No results found for: "WUJ811" No results found for: "CAN125"  No results found for: "TOTALPROTELP", "ALBUMINELP", "A1GS", "A2GS", "BETS", "BETA2SER", "GAMS", "MSPIKE", "SPEI" Lab Results  Component Value Date   TIBC 380 03/06/2021    TIBC 351 06/21/2019   TIBC 348 04/14/2016   FERRITIN 372 (H) 03/06/2021   FERRITIN 836 (H) 06/21/2019   FERRITIN 408 (H) 04/14/2016   IRONPCTSAT 71 (H) 03/06/2021   IRONPCTSAT 93 (H) 06/21/2019   IRONPCTSAT 57 04/14/2016   Lab Results  Component Value Date   LDH 142 03/06/2021   LDH 152 04/14/2016     STUDIES:   CT CHEST ABDOMEN PELVIS W CONTRAST Result Date: 01/15/2024 CLINICAL DATA:  Hepatocellular carcinoma, cirrhosis, metastatic disease staging * Tracking Code: BO * EXAM: CT CHEST, ABDOMEN, AND PELVIS WITH CONTRAST TECHNIQUE: Multidetector CT imaging of the chest, abdomen and pelvis was performed following the standard protocol during bolus administration of intravenous contrast. RADIATION DOSE REDUCTION: This exam was performed according to the departmental dose-optimization program which includes automated exposure control, adjustment of the mA and/or kV according to patient size and/or use of iterative reconstruction technique. CONTRAST:  80mL OMNIPAQUE IOHEXOL 300 MG/ML  SOLN COMPARISON:  MR abdomen, 12/04/2023 FINDINGS: CT CHEST FINDINGS Cardiovascular: Status post cholecystectomy. Normal heart size. Left coronary artery calcifications no pericardial effusion. Mediastinum/Nodes: No enlarged mediastinal, hilar, or axillary lymph nodes. Thyroid gland, trachea, and esophagus demonstrate no significant findings. Lungs/Pleura: Lungs are clear. No pleural effusion or pneumothorax. Musculoskeletal: No chest wall abnormality. No acute osseous findings. CT ABDOMEN PELVIS FINDINGS Hepatobiliary: Coarse, nodular cirrhotic morphology of the liver. Hepatic steatosis. Similar appearance of enhancing lesions in the anterior right lobe of the liver, hepatic segment VIII (series 2, image 57), of the posterior left lobe of the liver, hepatic segment II/III (series 2, image 67), and of inferior hepatic segment III (series 2, image 66). Status post cholecystectomy. No biliary ductal dilatation. Pancreas:  Unremarkable. No pancreatic ductal dilatation or surrounding inflammatory changes. Spleen: Normal in size without significant abnormality. Adrenals/Urinary Tract: Adrenal glands are unremarkable. Kidneys are normal, without renal calculi, solid  lesion, or hydronephrosis. Diffuse urinary bladder wall thickening. Stomach/Bowel: Stomach is within normal limits. Diverticulosis of the terminal ileum, with adjacent fat stranding and wall thickening (series 4, image 62). Descending and sigmoid diverticulosis. Vascular/Lymphatic: Aortic atherosclerosis. Recanalization of the umbilical vein with ventral abdominal wall varices. Gastroesophageal varices (series 2, image 48). No enlarged abdominal or pelvic lymph nodes. Reproductive: Small fibroids. Other: No abdominal wall hernia or abnormality. No ascites. Musculoskeletal: No acute osseous findings. Status post left hip total arthroplasty. IMPRESSION: 1. Cirrhosis and hepatic steatosis. Similar appearance of enhancing lesions in the liver, consistent with multifocal hepatocellular carcinoma and better characterized by recent prior MR. 2. No evidence of lymphadenopathy or metastatic disease in the chest, abdomen, or pelvis. 3. Diverticulosis of the terminal ileum, with adjacent fat stranding and wall thickening, consistent with small bowel diverticulitis. 4. Diffuse urinary bladder wall thickening, consistent with nonspecific infectious or inflammatory cystitis. 5. Coronary artery disease. These results will be called to the ordering clinician or representative by the Radiologist Assistant, and communication documented in the PACS or [employer]. Aortic Atherosclerosis (ICD10-I70.0). Electronically Signed   By: Jearld Lesch M.D.   On: 01/15/2024 12:43

## 2024-01-18 NOTE — Patient Instructions (Addendum)
[facility] at [facility] Discharge Instructions   You were seen and examined today by Dr. Ellin Saba.  He reviewed the results of your CT scan which show the cancer in the liver has not spread to any other area of your body.   The lesions on the liver are very small. Dr. Kirtland Bouchard recommends referring you to Interventional Radiology to have these spots treated.   We will obtain lab work today to further investigate your anemia (low hemoglobin). We will also get a cancer tumor marker as a baseline.   Return as scheduled.    Thank you for choosing [facility] at [facility] to provide your oncology and hematology care.  To afford each patient quality time with our provider, please arrive at least 15 minutes before your scheduled appointment time.   If you have a lab appointment with the Cancer Center please come in thru the Main Entrance and check in at the main information desk.  You need to re-schedule your appointment should you arrive 10 or more minutes late.  We strive to give you quality time with our providers, and arriving late affects you and other patients whose appointments are after yours.  Also, if you no show three or more times for appointments you may be dismissed from the clinic at the providers discretion.     Again, thank you for choosing [facility].  Our hope is that these requests will decrease the amount of time that you wait before being seen by our physicians.       _____________________________________________________________  Should you have questions after your visit to [facility], please contact our office at [phone] and follow the prompts.  Our office hours are 8:00 a.m. and 4:30 p.m. Monday - Friday.  Please note that voicemails left after 4:00 p.m. may not be returned until the following business day.  We are closed weekends and major holidays.  You do have access to a nurse 24-7, just call  the main number to the clinic [phone] and do not press any options, hold on the line and a nurse will answer the phone.    For prescription refill requests, have your pharmacy contact our office and allow 72 hours.    Due to Covid, you will need to wear a mask upon entering the hospital. If you do not have a mask, a mask will be given to you at the Main Entrance upon arrival. For doctor visits, patients may have 1 support person age 7 or older with them. For treatment visits, patients can not have anyone with them due to social distancing guidelines and our immunocompromised population.

## 2024-01-19 LAB — AFP TUMOR MARKER: AFP, Serum, Tumor Marker: 7 ng/mL (ref 0.0–9.2)

## 2024-01-20 LAB — METHYLMALONIC ACID, SERUM: Methylmalonic Acid, Quantitative: 353 nmol/L (ref 0–378)

## 2024-01-25 ENCOUNTER — Encounter: Payer: Self-pay | Admitting: Hematology

## 2024-02-01 ENCOUNTER — Other Ambulatory Visit: Payer: 59

## 2024-02-17 ENCOUNTER — Ambulatory Visit
Admission: RE | Admit: 2024-02-17 | Discharge: 2024-02-17 | Disposition: A | Discharge status: Home / Self Care | Payer: 59 | Source: Ambulatory Visit | Attending: Hematology | Admitting: Hematology

## 2024-02-17 ENCOUNTER — Encounter: Payer: Self-pay | Admitting: Hematology

## 2024-02-17 DIAGNOSIS — C220 Liver cell carcinoma: Secondary | ICD-10-CM | POA: Diagnosis not present

## 2024-02-17 DIAGNOSIS — K769 Liver disease, unspecified: Secondary | ICD-10-CM

## 2024-02-17 NOTE — Consult Note (Addendum)
 Chief Complaint: Patient was seen in consultation today for Liver lesions at the request of Katragadda,Sreedhar  Referring Physician(s): Katragadda,Sreedhar  History of Present Illness: Gabriella Leon is a 68 y.o. female smoker  with history of hepatitis C, cirrhosis 10/19/23 US abdomen reported Liver  Diffusely echogenic with irregular, nodular contours. A previously demonstrated 3.2 cm left lobe liver mass currently measured 2.2-1.8 and 1.6 cm. Interval 1.3 and 1.1 into 0.8 cm similar-appearing mass more laterally in the lateral segment of the left lobe of the liver. Interval 1.6 and 1.5 into 1.3 cm oval, heterogeneous mass more anteriorly in the liver on the right. Portal vein is patent on color Doppler imaging with normal direction of blood flow to the liver  12/04/23 MR liver demonstrated Arterially hyperenhancing subcapsular lesion of the anterior right lobe of the liver, segment 8, measuring 1.6 x 1.3 cm, LI-RADS 5. Additional hyperenhancing lesion of posterior left lobe of the liver, segment 2/3 measuring 1.9 x 1.7 cm, LI-RADS 5. Small hyperenhancing lesion inferiorly in segment 3 without evidence of washout measuring 0.8 cm, LI-RADS 3.  01/16/24 CT Chest/abd/pelvis showed No evidence of lymphadenopathy or metastatic disease  She was referred for consultation for liver directed therapies. She is feeling well.    Past Medical History:  Diagnosis Date   Acid reflux disease with ulcer    Anxiety    Arthritis    Asthma    Blood dyscrasia    thrombocytopenia-next tx injection 09/25/15   Blood transfusion    CHF (congestive heart failure) (HCC) 2021   Chronic kidney disease    Cirrhosis (HCC)    DDD (degenerative disc disease), lumbar    Depression    Diabetes mellitus    Dyspnea    Esophageal varices (HCC) 09/18/2014   Folate deficiency 03/28/2014   Noted on 03/23/2014.  Folate 1 mg prescribed.   Gallstones    GERD (gastroesophageal reflux disease)    Headache     migraines   Helicobacter pylori ab+    July 2010, s/p treatment   Hepatitis B antibody positive    Hepatitis C    and B positive antibody   History of alcoholism (HCC)    History of kidney stones    Hypertension    Left leg pain    Liver cirrhosis (HCC)    Low back pain    Other pancytopenia (HCC) 10/27/2013   Ulcer    ?    Past Surgical History:  Procedure Laterality Date   ANTERIOR CERVICAL DECOMP/DISCECTOMY FUSION N/A 09/28/2015   Procedure: Cervical three-four Anterior cervical decompression/diskectomy/fusion;  Surgeon: Hilda Lias, MD;  Location: [facility];  Service: Neurosurgery;  Laterality: N/A;  C3-4 Anterior cervical decompression/diskectomy/fusion   APPENDECTOMY     age 74   BIOPSY  12/18/2011       BIOPSY  05/19/2019   Procedure: BIOPSY;  Surgeon: Corbin Ade, MD;  Location: [facility];  Service: Endoscopy;;  gastric   CESAREAN SECTION  1988   [facility]   CHOLECYSTECTOMY  greater than 10 yrs   [facility]   COLONOSCOPY  12/18/11   minimal anal canal hemorrhoids, friable rectal and colonic mucosa, left-sided diverticulosis, repeat in 2022.    ESOPHAGOGASTRODUODENOSCOPY  12/18/11   3 columns of Grade II esophageal varices, reflux esohpagitis, portal gastropathy, path with chronic gastritis, negative H.pylori, surveillance in June 2014   ESOPHAGOGASTRODUODENOSCOPY (EGD) WITH PROPOFOL N/A 04/13/2014   Dr. Jena Gauss: grade 1-2 varices, hiatal hernia, portal gastropathy. NO need for further  surveillance while on prophylaxis unless evidence of bleeding   ESOPHAGOGASTRODUODENOSCOPY (EGD) WITH PROPOFOL N/A 05/19/2019   erosive reflux esophagitis, Grade 1-2 esophageal varices with patulous EG junction. Hiatal hernia. Portal gastropathy. Gastric and duodenal dulcer disease.    MULTIPLE EXTRACTIONS WITH ALVEOLOPLASTY  03/08/2012   Procedure: MULTIPLE EXTRACION WITH ALVEOLOPLASTY;  Surgeon: Georgia Lopes, DDS;  Location: [facility];  Service: Oral Surgery;  Laterality: N/A;   TOTAL HIP  ARTHROPLASTY Left 04/30/2021   Procedure: TOTAL HIP ARTHROPLASTY ANTERIOR APPROACH;  Surgeon: Durene Romans, MD;  Location: [facility];  Service: Orthopedics;  Laterality: Left;    Allergies: Patient has no known allergies.  Medications: no anticoagulants Prior to Admission medications   Medication Sig Start Date End Date Taking? Authorizing Provider  famotidine (PEPCID) 20 MG tablet Take 20 mg by mouth 2 (two) times daily.  07/20/19   [provider]  lisinopril-hydrochlorothiazide (PRINZIDE,ZESTORETIC) 10-12.5 MG per tablet Take 1 tablet by mouth daily.    [provider]  metFORMIN (GLUCOPHAGE) 500 MG tablet Take 500 mg by mouth daily with breakfast.    [provider]  methocarbamol (ROBAXIN) 500 MG tablet Take 1 tablet (500 mg total) by mouth every 6 (six) hours as needed for muscle spasms. 05/01/21   Cassandria Anger, PA-C  nadolol (CORGARD) 40 MG tablet Take 40 mg by mouth daily.  05/19/19   [provider]  naltrexone (DEPADE) 50 MG tablet Take 50 mg by mouth daily. 12/01/23   [provider]  oxyCODONE (OXY IR/ROXICODONE) 5 MG immediate release tablet Take 1-2 tablets (5-10 mg total) by mouth every 6 (six) hours as needed for severe pain (pain score 7-10) (pain 7-10). 12/24/23   Mauro Kaufmann, NP  pantoprazole (PROTONIX) 40 MG tablet Take 40 mg by mouth 2 (two) times daily. 11/25/19   [provider]  polyethylene glycol (MIRALAX / GLYCOLAX) 17 g packet Take 17 g by mouth daily as needed for mild constipation. 05/01/21   Cassandria Anger, PA-C  solifenacin (VESICARE) 5 MG tablet Take 5 mg by mouth daily. 12/01/23   [provider]  spironolactone (ALDACTONE) 25 MG tablet TAKE 1 TABLET BY MOUTH DAILY 11/22/19   Anice Paganini, NP  thiamine (VITAMIN B-1) 100 MG tablet Take 100 mg by mouth daily.    [provider]  Vitamin D, Ergocalciferol, (DRISDOL) 1.25 MG (50000 UNIT) CAPS capsule TAKE ONE CAPSULE BY MOUTH ONCE a WEEK  06/03/22   Doreatha Massed, MD     Family History  Problem Relation Age of Onset   Diabetes Mother    Heart disease Mother    Cancer Paternal Uncle        passed away age 69   Other Father        died in MVA in Mar 04, 1989   Heart disease Sister    Anesthesia problems Neg Hx    Hypotension Neg Hx    Malignant hyperthermia Neg Hx    Pseudochol deficiency Neg Hx    Colon cancer Neg Hx    Liver disease Neg Hx     Social History   Socioeconomic History   Marital status: Legally Separated    Spouse name: Not on file   Number of children: 1   Years of education: 10th grade   Highest education level: Not on file  Occupational History   Occupation: disabled    Employer: NOT EMPLOYED  Tobacco Use   Smoking status: Every Day    Current packs/day: 0.25  Average packs/day: 0.3 packs/day for 20.0 years (5.0 ttl pk-yrs)    Types: Cigarettes   Smokeless tobacco: Never  Vaping Use   Vaping status: Never Used  Substance and Sexual Activity   Alcohol use: Yes    Comment: occasionally   Drug use: No   Sexual activity: Yes    Birth control/protection: Post-menopausal  Other Topics Concern   Not on file  Social History Narrative   Lives at home alone.   Right-handed.   Occasional caffeine use.   Social Drivers of Corporate investment banker Strain: Not on file  Food Insecurity: No Food Insecurity (11/11/2023)   Hunger Vital Sign    Worried About Running Out of Food in the Last Year: Never true    Ran Out of Food in the Last Year: Never true  Transportation Needs: Unmet Transportation Needs (11/11/2023)   PRAPARE - Administrator, Civil Service (Medical): Yes    Lack of Transportation (Non-Medical): Yes  Physical Activity: Not on file  Stress: Not on file  Social Connections: Not on file    ECOG Status: 0 - Asymptomatic  Review of Systems: A 12 point ROS discussed and pertinent positives are indicated in the HPI above.  All other systems are  negative.  Review of Systems  Vital Signs: BP (!) 143/61 (BP Location: Left Arm)   Pulse 66   Temp 98.3 F (36.8 C) (Oral) Comment: No pain, no appetite x 74month  Resp 15   SpO2 97%    Physical Exam Constitutional: Oriented to person, place, and time. Well-developed and well-nourished. No distress.   HENT:  Head: Normocephalic and atraumatic.  Eyes: Conjunctivae and EOM are normal. Right eye exhibits no discharge. Left eye exhibits no discharge. No scleral icterus.  Neck: No JVD present.  Pulmonary/Chest: Effort normal. No stridor. No respiratory distress.  Abdomen: soft, non distended Neurological:  alert and oriented to person, place, and time.  Skin: Skin is warm and dry.  not diaphoretic.  Psychiatric:   normal mood and affect.   behavior is normal. Judgment and thought content normal.       Imaging: MRI ABDOMEN WITHOUT AND WITH CONTRAST  TECHNIQUE: Multiplanar multisequence MR imaging of the abdomen was performed both before and after the administration of intravenous contrast.  CONTRAST: 7.51mL GADAVIST GADOBUTROL 1 MMOL/ML IV SOLN  COMPARISON: 01/17/2020  FINDINGS: Lower chest: No acute abnormality.  Hepatobiliary: Coarse, nodular cirrhotic morphology of the liver. Arterially hyperenhancing diffusion restricting subcapsular lesion of the anterior right lobe of the liver, hepatic segment VIII, measuring 1.6 x 1.3 cm, with evidence of washout and capsular enhancement (series 11, image 30, series 15, image 32). Additional arterially hyperenhancing diffusion restricting lesion of posterior left lobe of the liver, hepatic segment II/III measuring 1.9 x 1.7 cm, also with evidence of washout and capsular enhancement (series 11, image 35). Smaller arterially enhancing lesion inferiorly in hepatic segment III, without evidence of washout or capsular enhancement, measuring 0.8 cm (series 11, image 48). Hepatic steatosis. Status post cholecystectomy. No biliary  ductal dilatation.  Pancreas: Unremarkable. No pancreatic ductal dilatation or surrounding inflammatory changes.  Spleen: Normal in size without significant abnormality.  Adrenals/Urinary Tract: Adrenal glands are unremarkable. Simple, benign bilateral renal cortical cysts kidneys are otherwise normal, without obvious renal calculi, solid lesion, or hydronephrosis.  Stomach/Bowel: Stomach is within normal limits. No evidence of bowel wall thickening, distention, or inflammatory changes.  Vascular/Lymphatic: Recanalization of the umbilical vein as well as small gastroesophageal varices. No  enlarged abdominal lymph nodes.  Other: No abdominal wall hernia or abnormality. No ascites.  Musculoskeletal: No acute or significant osseous findings.  IMPRESSION: 1. Arterially hyperenhancing subcapsular lesion of the anterior right lobe of the liver, hepatic segment VIII, measuring 1.6 x 1.3 cm, with evidence of washout and capsular enhancement, consistent with hepatocellular carcinoma. LI-RADS category 5. 2. Additional arterially hyperenhancing lesion of posterior left lobe of the liver, hepatic segment II/III measuring 1.9 x 1.7 cm, also with evidence of washout and capsular enhancement, consistent with hepatocellular carcinoma. LI-RADS category 5. 3. Smaller arterially enhancing lesion inferiorly in hepatic segment III, without evidence of washout or capsular enhancement, measuring 0.8 cm, intermediate suspicion for an additional small hepatocellular carcinoma. LI-RADS category 3. 4. Cirrhosis and portal hypertension, including small gastroesophageal varices..   Electronically Signed By: Jearld Lesch M.D. On: 12/06/2023 14:55   Labs:  CBC: Recent Labs    11/11/23 1521  WBC 2.7*  HGB 10.6*  HCT 32.0*  PLT 80*    COAGS: Recent Labs    11/11/23 1521  INR 1.2  APTT 33    BMP: Recent Labs    11/11/23 1521  NA 139  K 3.8  CL 107  CO2 22  GLUCOSE 104*  BUN 15   CALCIUM 9.1  CREATININE 1.18*  GFRNONAA 51*    LIVER FUNCTION TESTS: Recent Labs    11/11/23 1521  BILITOT 2.1*  AST 41  ALT 20  ALKPHOS 161*  PROT 7.5  ALBUMIN 3.2*    TUMOR MARKERS:     Component Ref Range & Units (hover) 3 mo ago 4 yr ago  AFP, Serum, Tumor Marker 7.1 3.6 R, CM      Assessment and Plan:  My impression is that this patient has multifocal hepatocellular carcinoma identified on imaging, on the background of hepatic cirrhosis. The two LR5 lesions are approachable percutaneously for RF ablation. THis would minimize collateral damage to the background parenchyma compared to TACE, Y90 or external radiotherapy.The smaller LR3 lesion can be followed on imaging for now. We looked at the imaging together and discussed the percutaneous RF needle ablation procedure x2, the anticipated needle trajectory, anticipated benefits, possibles risks and complications including but not limited to bleeding/infection/liver or adjacent organ damage/pain/incomplete ablation. We discussed the need for lifelong surveillance post treatment. She seemed to understand and asked appropriate questions. She is motivated to proceed. Therefore, we can set her up for CT guided liver MW ablation x2 under anesthesia at [facility] at her convenience. We will attempt percutaneous biopsy at the same time.   Thank you for this interesting consult.  I greatly enjoyed meeting Perri Leon and look forward to participating in their care.  A copy of this report was sent to the requesting provider on this date.  Electronically Signed: Durwin Glaze 02/17/2024, 3:00 PM   I spent a total of  40 Minutes   in face to face in clinical consultation, greater than 50% of which was counseling/coordinating care for hepatocellular carcinoma x2.

## 2024-02-22 ENCOUNTER — Other Ambulatory Visit (HOSPITAL_COMMUNITY): Payer: Self-pay | Admitting: Interventional Radiology

## 2024-02-22 DIAGNOSIS — K7460 Unspecified cirrhosis of liver: Secondary | ICD-10-CM | POA: Diagnosis not present

## 2024-02-22 DIAGNOSIS — K769 Liver disease, unspecified: Secondary | ICD-10-CM

## 2024-02-22 DIAGNOSIS — C220 Liver cell carcinoma: Secondary | ICD-10-CM | POA: Diagnosis not present

## 2024-03-03 DIAGNOSIS — H524 Presbyopia: Secondary | ICD-10-CM | POA: Diagnosis not present

## 2024-03-08 DIAGNOSIS — K219 Gastro-esophageal reflux disease without esophagitis: Secondary | ICD-10-CM | POA: Diagnosis not present

## 2024-03-08 DIAGNOSIS — E1122 Type 2 diabetes mellitus with diabetic chronic kidney disease: Secondary | ICD-10-CM | POA: Diagnosis not present

## 2024-03-08 DIAGNOSIS — J0110 Acute frontal sinusitis, unspecified: Secondary | ICD-10-CM | POA: Diagnosis not present

## 2024-03-08 DIAGNOSIS — I700 Atherosclerosis of aorta: Secondary | ICD-10-CM | POA: Diagnosis not present

## 2024-03-08 DIAGNOSIS — N182 Chronic kidney disease, stage 2 (mild): Secondary | ICD-10-CM | POA: Diagnosis not present

## 2024-03-08 DIAGNOSIS — I10 Essential (primary) hypertension: Secondary | ICD-10-CM | POA: Diagnosis not present

## 2024-03-08 DIAGNOSIS — Z0000 Encounter for general adult medical examination without abnormal findings: Secondary | ICD-10-CM | POA: Diagnosis not present

## 2024-03-11 ENCOUNTER — Other Ambulatory Visit: Payer: Self-pay

## 2024-03-11 DIAGNOSIS — D508 Other iron deficiency anemias: Secondary | ICD-10-CM

## 2024-03-11 DIAGNOSIS — R160 Hepatomegaly, not elsewhere classified: Secondary | ICD-10-CM

## 2024-03-14 ENCOUNTER — Other Ambulatory Visit: Payer: Self-pay | Admitting: Radiology

## 2024-03-14 ENCOUNTER — Inpatient Hospital Stay: Payer: 59 | Admitting: Oncology

## 2024-03-14 ENCOUNTER — Inpatient Hospital Stay: Payer: 59

## 2024-03-14 DIAGNOSIS — C220 Liver cell carcinoma: Secondary | ICD-10-CM

## 2024-03-14 NOTE — Progress Notes (Signed)
 Orders requested with radiology.

## 2024-03-16 ENCOUNTER — Encounter (HOSPITAL_COMMUNITY): Payer: Self-pay

## 2024-03-16 ENCOUNTER — Other Ambulatory Visit: Payer: Self-pay

## 2024-03-16 ENCOUNTER — Encounter (HOSPITAL_COMMUNITY)
Admission: RE | Admit: 2024-03-16 | Discharge: 2024-03-16 | Disposition: A | Discharge status: Home / Self Care | Source: Ambulatory Visit | Attending: Interventional Radiology | Admitting: Interventional Radiology

## 2024-03-16 VITALS — BP 147/74 | HR 52 | Temp 98.5°F | Resp 14 | Ht 64.0 in | Wt 166.0 lb

## 2024-03-16 DIAGNOSIS — Z01812 Encounter for preprocedural laboratory examination: Secondary | ICD-10-CM | POA: Insufficient documentation

## 2024-03-16 DIAGNOSIS — E119 Type 2 diabetes mellitus without complications: Secondary | ICD-10-CM | POA: Diagnosis not present

## 2024-03-16 DIAGNOSIS — Z01818 Encounter for other preprocedural examination: Secondary | ICD-10-CM | POA: Diagnosis present

## 2024-03-16 DIAGNOSIS — C220 Liver cell carcinoma: Secondary | ICD-10-CM | POA: Diagnosis not present

## 2024-03-16 HISTORY — DX: Chronic obstructive pulmonary disease, unspecified: J44.9

## 2024-03-16 LAB — COMPREHENSIVE METABOLIC PANEL
ALT: 19 U/L (ref 0–44)
AST: 37 U/L (ref 15–41)
Albumin: 3.1 g/dL — ABNORMAL LOW (ref 3.5–5.0)
Alkaline Phosphatase: 141 U/L — ABNORMAL HIGH (ref 38–126)
Anion gap: 8 (ref 5–15)
BUN: 17 mg/dL (ref 8–23)
CO2: 23 mmol/L (ref 22–32)
Calcium: 9.1 mg/dL (ref 8.9–10.3)
Chloride: 107 mmol/L (ref 98–111)
Creatinine, Ser: 1.2 mg/dL — ABNORMAL HIGH (ref 0.44–1.00)
GFR, Estimated: 50 mL/min — ABNORMAL LOW (ref >60)
Glucose, Bld: 135 mg/dL — ABNORMAL HIGH (ref 70–99)
Potassium: 4 mmol/L (ref 3.5–5.1)
Sodium: 138 mmol/L (ref 135–145)
Total Bilirubin: 1.6 mg/dL — ABNORMAL HIGH (ref 0.0–1.2)
Total Protein: 7 g/dL (ref 6.5–8.1)

## 2024-03-16 LAB — CBC WITH DIFFERENTIAL/PLATELET
Abs Immature Granulocytes: 0.02 10*3/uL (ref 0.00–0.07)
Basophils Absolute: 0 10*3/uL (ref 0.0–0.1)
Basophils Relative: 1 %
Eosinophils Absolute: 0.2 10*3/uL (ref 0.0–0.5)
Eosinophils Relative: 5 %
HCT: 32.3 % — ABNORMAL LOW (ref 36.0–46.0)
Hemoglobin: 10.2 g/dL — ABNORMAL LOW (ref 12.0–15.0)
Immature Granulocytes: 1 %
Lymphocytes Relative: 26 %
Lymphs Abs: 0.9 10*3/uL (ref 0.7–4.0)
MCH: 34 pg (ref 26.0–34.0)
MCHC: 31.6 g/dL (ref 30.0–36.0)
MCV: 107.7 fL — ABNORMAL HIGH (ref 80.0–100.0)
Monocytes Absolute: 0.4 10*3/uL (ref 0.1–1.0)
Monocytes Relative: 11 %
Neutro Abs: 2 10*3/uL (ref 1.7–7.7)
Neutrophils Relative %: 56 %
Platelets: 68 10*3/uL — ABNORMAL LOW (ref 150–400)
RBC: 3 MIL/uL — ABNORMAL LOW (ref 3.87–5.11)
RDW: 13.9 % (ref 11.5–15.5)
WBC: 3.5 10*3/uL — ABNORMAL LOW (ref 4.0–10.5)
nRBC: 0 % (ref 0.0–0.2)

## 2024-03-16 LAB — GLUCOSE, CAPILLARY: Glucose-Capillary: 134 mg/dL — ABNORMAL HIGH (ref 70–99)

## 2024-03-16 LAB — HEMOGLOBIN A1C
Hgb A1c MFr Bld: 4.1 % — ABNORMAL LOW (ref 4.8–5.6)
Mean Plasma Glucose: 70.97 mg/dL

## 2024-03-16 LAB — PROTIME-INR
INR: 1.2 (ref 0.8–1.2)
Prothrombin Time: 15.4 s — ABNORMAL HIGH (ref 11.4–15.2)

## 2024-03-16 NOTE — Patient Instructions (Addendum)
 SURGICAL WAITING ROOM VISITATION  Patients having surgery or a procedure may have no more than 2 support people in the waiting area - these visitors may rotate.    Children under the age of 49 must have an adult with them who is not the patient.  Due to an increase in RSV and influenza rates and associated hospitalizations, children ages 74 and under may not visit patients in [facility] hospitals.  Visitors with respiratory illnesses are discouraged from visiting and should remain at home.  If the patient needs to stay at the hospital during part of their recovery, the visitor guidelines for inpatient rooms apply. Pre-op nurse will coordinate an appropriate time for 1 support person to accompany patient in pre-op.  This support person may not rotate.    Please refer to the [facility] website for the visitor guidelines for Inpatients (after your surgery is over and you are in a regular room).    Your procedure is scheduled on: 03/23/24   Report to [facility] Main Entrance    Report to admitting at 6:15 AM   Call this number if you have problems the morning of surgery [phone]   Do not eat food or drink liquids :After Midnight.          If you have questions, please contact your surgeon's office.   FOLLOW BOWEL PREP AND ANY ADDITIONAL PRE OP INSTRUCTIONS YOU RECEIVED FROM YOUR SURGEON'S OFFICE!!!     Oral Hygiene is also important to reduce your risk of infection.                                    Remember - BRUSH YOUR TEETH THE MORNING OF SURGERY WITH YOUR REGULAR TOOTHPASTE  DENTURES WILL BE REMOVED PRIOR TO SURGERY PLEASE DO NOT APPLY "Poly grip" OR ADHESIVES!!!   Do NOT smoke after Midnight   Stop all vitamins and herbal supplements 7 days before surgery.   Take these medicines the morning of surgery with A SIP OF WATER: Pantoprazole, Nadolol, Inhalers  DO NOT TAKE ANY ORAL DIABETIC MEDICATIONS DAY OF YOUR SURGERY  How to Manage Your Diabetes Before and  After Surgery  Why is it important to control my blood sugar before and after surgery? Improving blood sugar levels before and after surgery helps healing and can limit problems. A way of improving blood sugar control is eating a healthy diet by:  Eating less sugar and carbohydrates  Increasing activity/exercise  Talking with your doctor about reaching your blood sugar goals High blood sugars (greater than 180 mg/dL) can raise your risk of infections and slow your recovery, so you will need to focus on controlling your diabetes during the weeks before surgery. Make sure that the doctor who takes care of your diabetes knows about your planned surgery including the date and location.  How do I manage my blood sugar before surgery? Check your blood sugar at least 4 times a day, starting 2 days before surgery, to make sure that the level is not too high or low. Check your blood sugar the morning of your surgery when you wake up and every 2 hours until you get to the Short Stay unit. If your blood sugar is less than 70 mg/dL, you will need to treat for low blood sugar: Do not take insulin. Treat a low blood sugar (less than 70 mg/dL) with  cup of clear juice (cranberry or  apple), 4 glucose tablets, OR glucose gel. Recheck blood sugar in 15 minutes after treatment (to make sure it is greater than 70 mg/dL). If your blood sugar is not greater than 70 mg/dL on recheck, call [phone] for further instructions. Report your blood sugar to the short stay nurse when you get to Short Stay.  If you are admitted to the hospital after surgery: Your blood sugar will be checked by the staff and you will probably be given insulin after surgery (instead of oral diabetes medicines) to make sure you have good blood sugar levels. The goal for blood sugar control after surgery is 80-180 mg/dL.   WHAT DO I DO ABOUT MY DIABETES MEDICATION?  Do not take oral diabetes medicines (pills) the morning of  surgery.  THE DAY BEFORE SURGERY, take Metformin as prescribed.     THE MORNING OF SURGERY, do not take Metformin.    Reviewed and Endorsed by [facility] Patient Education Committee, August 2015                              You may not have any metal on your body including hair pins, jewelry, and body piercing             Do not wear make-up, lotions, powders, perfumes, or deodorant  Do not wear nail polish including gel and S&S, artificial/acrylic nails, or any other type of covering on natural nails including finger and toenails. If you have artificial nails, gel coating, etc. that needs to be removed by a nail salon please have this removed prior to surgery or surgery may need to be canceled/ delayed if the surgeon/ anesthesia feels like they are unable to be safely monitored.   Do not shave  48 hours prior to surgery.    Do not bring valuables to the hospital. [facility] IS NOT             RESPONSIBLE   FOR VALUABLES.   Contacts, glasses, dentures or bridgework may not be worn into surgery.   Bring small overnight bag day of surgery.   DO NOT BRING YOUR HOME MEDICATIONS TO THE HOSPITAL. PHARMACY WILL DISPENSE MEDICATIONS LISTED ON YOUR MEDICATION LIST TO YOU DURING YOUR ADMISSION IN THE HOSPITAL!              Please read over the following fact sheets you were given: IF YOU HAVE QUESTIONS ABOUT YOUR PRE-OP INSTRUCTIONS PLEASE CALL [phone] Contras    If you received a COVID test during your pre-op visit  it is requested that you wear a mask when out in public, stay away from anyone that may not be feeling well and notify your surgeon if you develop symptoms. If you test positive for Covid or have been in contact with anyone that has tested positive in the last 10 days please notify you surgeon.    [facility] - Preparing for Surgery Before surgery, you can play an important role.  Because skin is not sterile, your skin needs to be as free of germs as possible.  You can  reduce the number of germs on your skin by washing with CHG (chlorahexidine gluconate) soap before surgery.  CHG is an antiseptic cleaner which kills germs and bonds with the skin to continue killing germs even after washing. Please DO NOT use if you have an allergy to CHG or antibacterial soaps.  If your skin becomes reddened/irritated stop using the CHG  and inform your nurse when you arrive at Short Stay. Do not shave (including legs and underarms) for at least 48 hours prior to the first CHG shower.  You may shave your face/neck.  Please follow these instructions carefully:  1.  Shower with CHG Soap the night before surgery and the  morning of surgery.  2.  If you choose to wash your hair, wash your hair first as usual with your normal  shampoo.  3.  After you shampoo, rinse your hair and body thoroughly to remove the shampoo.                             4.  Use CHG as you would any other liquid soap.  You can apply chg directly to the skin and wash.  Gently with a scrungie or clean washcloth.  5.  Apply the CHG Soap to your body ONLY FROM THE NECK DOWN.   Do   not use on face/ open                           Wound or open sores. Avoid contact with eyes, ears mouth and   genitals (private parts).                       Wash face,  Genitals (private parts) with your normal soap.             6.  Wash thoroughly, paying special attention to the area where your    surgery  will be performed.  7.  Thoroughly rinse your body with warm water from the neck down.  8.  DO NOT shower/wash with your normal soap after using and rinsing off the CHG Soap.                9.  Pat yourself dry with a clean towel.            10.  Wear clean pajamas.            11.  Place clean sheets on your bed the night of your first shower and do not  sleep with pets. Day of Surgery : Do not apply any lotions/deodorants the morning of surgery.  Please wear clean clothes to the hospital/surgery center.  FAILURE TO FOLLOW THESE  INSTRUCTIONS MAY RESULT IN THE CANCELLATION OF YOUR SURGERY  PATIENT SIGNATURE_________________________________  NURSE SIGNATURE__________________________________  ________________________________________________________________________

## 2024-03-16 NOTE — Progress Notes (Addendum)
 COVID Vaccine Completed:  Date of COVID positive in last 90 days: no  PCP - Lia Hopping, MD Cardiologist - Dina Rich, MD Hematologist- Cindie Crumbly, MD  Chest x-ray - CT 01/15/24 Epic EKG - 01/03/24 [facility] Stress Test - 05/17/20 Epic ECHO - n/a Cardiac Cath - n/a Pacemaker/ICD device last checked: n/a Spinal Cord Stimulator: n/a  Bowel Prep - n/a  Sleep Study - n/a CPAP -   Fasting Blood Sugar - 70-170 Checks Blood Sugar 1-2 times per week  Last dose of GLP1 agonist-  N/A GLP1 instructions:  Hold 7 days before surgery    Last dose of SGLT-2 inhibitors-  N/A SGLT-2 instructions:  Hold 3 days before surgery    Blood Thinner Instructions:  Last dose: n/a Time: Aspirin Instructions: Last Dose:  Activity level: Can go up a flight of stairs and perform activities of daily living without stopping and without symptoms of chest pain. SOB occasionally, has hx COPD   Anesthesia review: CHF, DM2, CKD, hep C, cirrhosis, asthma, in ED for palpitations 01/03/24, COPD  Patient denies shortness of breath, fever, cough and chest pain at PAT appointment  Patient verbalized understanding of instructions that were given to them at the PAT appointment. Patient was also instructed that they will need to review over the PAT instructions again at home before surgery.

## 2024-03-17 ENCOUNTER — Encounter: Payer: Self-pay | Admitting: Hematology

## 2024-03-21 ENCOUNTER — Other Ambulatory Visit: Payer: Self-pay | Admitting: Radiology

## 2024-03-21 DIAGNOSIS — K769 Liver disease, unspecified: Secondary | ICD-10-CM

## 2024-03-22 ENCOUNTER — Inpatient Hospital Stay: Admitting: Oncology

## 2024-03-22 ENCOUNTER — Encounter: Payer: Self-pay | Admitting: Hematology

## 2024-03-22 ENCOUNTER — Inpatient Hospital Stay: Attending: Oncology

## 2024-03-22 DIAGNOSIS — Z720 Tobacco use: Secondary | ICD-10-CM | POA: Insufficient documentation

## 2024-03-22 DIAGNOSIS — C220 Liver cell carcinoma: Secondary | ICD-10-CM | POA: Insufficient documentation

## 2024-03-22 DIAGNOSIS — R109 Unspecified abdominal pain: Secondary | ICD-10-CM | POA: Insufficient documentation

## 2024-03-22 DIAGNOSIS — D61818 Other pancytopenia: Secondary | ICD-10-CM | POA: Insufficient documentation

## 2024-03-22 NOTE — H&P (Signed)
 Chief Complaint: Multiple liver lesions concerning for multifocal hepatocellular carcinoma; referred for image guided liver lesion microwave ablation/biopsy x2  Referring Provider(s): Katragadda,S  Supervising Physician: Oley Balm  Patient Status: [facility] - Out-pt  History of Present Illness: Gabriella Leon is a 68 y.o. female smoker with PMH sig for GERD, anxiety, asthma, CHF, CKD, CAD, COPD, DDD, depression, DM, esophageal varices, hep B/C, nephrolithiasis, HTN and cirrhosis/fatty liver/portal HTN by imaging along with enhancing liver lesions concerning for multifocal HCC. She was seen in consultation by Dr. Deanne Coffer on 02/17/24 to discuss treatment options and deemed an appropriate candidate for image guided MWA/possible biopsy of 2 LI-RADS cat 5 lesions. She presents today for the procedure.   *** Patient is Full Code  Past Medical History:  Diagnosis Date   Acid reflux disease with ulcer    Anxiety    Arthritis    Asthma    Blood dyscrasia    thrombocytopenia-next tx injection 09/25/15   Blood transfusion    CHF (congestive heart failure) (HCC) 2021   Chronic kidney disease    Cirrhosis (HCC)    COPD (chronic obstructive pulmonary disease) (HCC)    DDD (degenerative disc disease), lumbar    Depression    Diabetes mellitus    Dyspnea    Esophageal varices (HCC) 09/18/2014   Folate deficiency 03/28/2014   Noted on 03/23/2014.  Folate 1 mg prescribed.   Gallstones    GERD (gastroesophageal reflux disease)    Headache    migraines   Helicobacter pylori ab+    July 2010, s/p treatment   Hepatitis B antibody positive    Hepatitis C    and B positive antibody   History of alcoholism (HCC)    History of kidney stones    Hypertension    Left leg pain    Liver cirrhosis (HCC)    Low back pain    Other pancytopenia (HCC) 10/27/2013   Ulcer    ?    Past Surgical History:  Procedure Laterality Date   ANTERIOR CERVICAL DECOMP/DISCECTOMY FUSION N/A 09/28/2015    Procedure: Cervical three-four Anterior cervical decompression/diskectomy/fusion;  Surgeon: Hilda Lias, MD;  Location: [facility] NEURO ORS;  Service: Neurosurgery;  Laterality: N/A;  C3-4 Anterior cervical decompression/diskectomy/fusion   APPENDECTOMY     age 59   BIOPSY  12/18/2011       BIOPSY  05/19/2019   Procedure: BIOPSY;  Surgeon: Corbin Ade, MD;  Location: [facility];  Service: Endoscopy;;  gastric   CESAREAN SECTION  1988   [facility]   CHOLECYSTECTOMY  greater than 10 yrs   [facility]   COLONOSCOPY  12/18/11   minimal anal canal hemorrhoids, friable rectal and colonic mucosa, left-sided diverticulosis, repeat in 2022.    ESOPHAGOGASTRODUODENOSCOPY  12/18/11   3 columns of Grade II esophageal varices, reflux esohpagitis, portal gastropathy, path with chronic gastritis, negative H.pylori, surveillance in June 2014   ESOPHAGOGASTRODUODENOSCOPY (EGD) WITH PROPOFOL N/A 04/13/2014   Dr. Jena Gauss: grade 1-2 varices, hiatal hernia, portal gastropathy. NO need for further surveillance while on prophylaxis unless evidence of bleeding   ESOPHAGOGASTRODUODENOSCOPY (EGD) WITH PROPOFOL N/A 05/19/2019   erosive reflux esophagitis, Grade 1-2 esophageal varices with patulous EG junction. Hiatal hernia. Portal gastropathy. Gastric and duodenal dulcer disease.    MULTIPLE EXTRACTIONS WITH ALVEOLOPLASTY  03/08/2012   Procedure: MULTIPLE EXTRACION WITH ALVEOLOPLASTY;  Surgeon: Georgia Lopes, DDS;  Location: [facility] OR;  Service: Oral Surgery;  Laterality: N/A;   TOTAL HIP ARTHROPLASTY Left 04/30/2021  Procedure: TOTAL HIP ARTHROPLASTY ANTERIOR APPROACH;  Surgeon: Durene Romans, MD;  Location: [facility] ORS;  Service: Orthopedics;  Laterality: Left;    Allergies: Patient has no known allergies.  Medications: Prior to Admission medications   Medication Sig Start Date End Date Taking? Authorizing Provider  BREZTRI AEROSPHERE 160-9-4.8 MCG/ACT AERO Inhale 2 puffs into the lungs in the morning and at bedtime.   Yes  [provider]  hydrOXYzine (ATARAX) 10 MG tablet Take 10-20 mg by mouth at bedtime as needed for itching.   Yes [provider]  metFORMIN (GLUCOPHAGE) 500 MG tablet Take 500 mg by mouth daily with breakfast.   Yes [provider]  mirtazapine (REMERON) 15 MG tablet Take 15 mg by mouth at bedtime.   Yes [provider]  nadolol (CORGARD) 40 MG tablet Take 40 mg by mouth daily at 12 noon. 05/19/19  Yes [provider]  naltrexone (DEPADE) 50 MG tablet Take 50 mg by mouth daily. 12/01/23  Yes [provider]  pantoprazole (PROTONIX) 40 MG tablet Take 40 mg by mouth daily before breakfast. 11/25/19  Yes [provider]  solifenacin (VESICARE) 5 MG tablet Take 5 mg by mouth in the morning. 12/01/23  Yes [provider]  spironolactone (ALDACTONE) 25 MG tablet TAKE 1 TABLET BY MOUTH DAILY Patient taking differently: Take 25 mg by mouth in the morning and at bedtime. 11/22/19  Yes Anice Paganini, NP  thiamine (VITAMIN B-1) 100 MG tablet Take 100 mg by mouth daily.   Yes [provider]  TYLENOL 8 HOUR 650 MG CR tablet Take 650-1,300 mg by mouth every 8 (eight) hours as needed for pain.   Yes [provider]  Vitamin D, Ergocalciferol, (DRISDOL) 1.25 MG (50000 UNIT) CAPS capsule TAKE ONE CAPSULE BY MOUTH ONCE a WEEK Patient taking differently: Take 50,000 Units by mouth every Wednesday. 06/03/22  Yes Doreatha Massed, MD  methocarbamol (ROBAXIN) 500 MG tablet Take 1 tablet (500 mg total) by mouth every 6 (six) hours as needed for muscle spasms. Patient not taking: Reported on 03/16/2024 05/01/21   Cassandria Anger, PA-C  oxyCODONE (OXY IR/ROXICODONE) 5 MG immediate release tablet Take 1-2 tablets (5-10 mg total) by mouth every 6 (six) hours as needed for severe pain (pain score 7-10) (pain 7-10). Patient not taking: Reported on 03/16/2024 12/24/23   Mauro Kaufmann, NP  polyethylene glycol (MIRALAX / GLYCOLAX) 17 g  packet Take 17 g by mouth daily as needed for mild constipation. Patient not taking: Reported on 03/16/2024 05/01/21   Cassandria Anger, PA-C     Family History  Problem Relation Age of Onset   Diabetes Mother    Heart disease Mother    Cancer Paternal Uncle        passed away age 74   Other Father        died in MVA in 1989/04/18   Heart disease Sister    Anesthesia problems Neg Hx    Hypotension Neg Hx    Malignant hyperthermia Neg Hx    Pseudochol deficiency Neg Hx    Colon cancer Neg Hx    Liver disease Neg Hx     Social History   Socioeconomic History   Marital status: Legally Separated    Spouse name: Not on file   Number of children: 1   Years of education: 10th grade   Highest education level: Not on file  Occupational History   Occupation: disabled    Employer: NOT EMPLOYED  Tobacco Use   Smoking status: Every Day    Current packs/day: 0.25    Average packs/day: 0.3 packs/day for 20.0 years (5.0 ttl pk-yrs)    Types: Cigarettes   Smokeless tobacco: Never  Vaping Use   Vaping status: Never Used  Substance and Sexual Activity   Alcohol use: Not Currently   Drug use: No   Sexual activity: Yes    Birth control/protection: Post-menopausal  Other Topics Concern   Not on file  Social History Narrative   Lives at home alone.   Right-handed.   Occasional caffeine use.   Social Drivers of Corporate investment banker Strain: Not on file  Food Insecurity: No Food Insecurity (11/11/2023)   Hunger Vital Sign    Worried About Running Out of Food in the Last Year: Never true    Ran Out of Food in the Last Year: Never true  Transportation Needs: Unmet Transportation Needs (11/11/2023)   PRAPARE - Administrator, Civil Service (Medical): Yes    Lack of Transportation (Non-Medical): Yes  Physical Activity: Not on file  Stress: Not on file  Social Connections: Not on file      Review of Systems  Vital Signs:   Advance Care Plan: no documents on  file  Physical Exam  Imaging: No results found.  Labs:  CBC: Recent Labs    11/11/23 1521 03/16/24 1443  WBC 2.7* 3.5*  HGB 10.6* 10.2*  HCT 32.0* 32.3*  PLT 80* 68*    COAGS: Recent Labs    11/11/23 1521 03/16/24 1443  INR 1.2 1.2  APTT 33  --     BMP: Recent Labs    11/11/23 1521 03/16/24 1443  NA 139 138  K 3.8 4.0  CL 107 107  CO2 22 23  GLUCOSE 104* 135*  BUN 15 17  CALCIUM 9.1 9.1  CREATININE 1.18* 1.20*  GFRNONAA 51* 50*    LIVER FUNCTION TESTS: Recent Labs    11/11/23 1521 03/16/24 1443  BILITOT 2.1* 1.6*  AST 41 37  ALT 20 19  ALKPHOS 161* 141*  PROT 7.5 7.0  ALBUMIN 3.2* 3.1*    TUMOR MARKERS: No results for input(s): "AFPTM", "CEA", "CA199", "CHROMGRNA" in the last 8760 hours.  Assessment and Plan: 68 y.o. female smoker with PMH sig for GERD, anxiety, asthma, CHF, CKD, CAD, COPD, DDD, depression, DM, esophageal varices, hep B/C, nephrolithiasis, HTN and cirrhosis/fatty liver/portal HTN by imaging along with enhancing liver lesions concerning for multifocal HCC. She was seen in consultation by Dr. Deanne Coffer on 02/17/24 to discuss treatment options and deemed an appropriate candidate for image guided MWA/possible biopsy of 2 LI-RADS cat 5 lesions. She presents today for the procedure.Risks and benefits of procedure was discussed with the patient   including, but not limited to bleeding, infection, damage to adjacent structures , anesthesia related complications or low yield requiring additional tests.  All of the questions were answered and there is agreement to proceed.  Consent signed and in chart.    Thank you for allowing our service to participate in Gabriella Leon 's care.  Electronically Signed: D. Jeananne Rama, PA-C   03/22/2024, 2:57 PM      I spent a total of    25 Minutes in face to face in clinical consultation, greater than 50% of which was counseling/coordinating care for image guided microwave ablation/biopsy of  liver lesions

## 2024-03-23 ENCOUNTER — Observation Stay (HOSPITAL_COMMUNITY)
Admission: RE | Admit: 2024-03-23 | Discharge: 2024-03-24 | Disposition: A | Discharge status: Home / Self Care | Source: Ambulatory Visit | Attending: Interventional Radiology | Admitting: Interventional Radiology

## 2024-03-23 ENCOUNTER — Encounter (HOSPITAL_COMMUNITY): Payer: Self-pay

## 2024-03-23 ENCOUNTER — Ambulatory Visit (HOSPITAL_COMMUNITY): Payer: Self-pay | Admitting: Physician Assistant

## 2024-03-23 ENCOUNTER — Encounter (HOSPITAL_COMMUNITY): Payer: Self-pay | Admitting: Interventional Radiology

## 2024-03-23 ENCOUNTER — Other Ambulatory Visit: Payer: Self-pay

## 2024-03-23 ENCOUNTER — Encounter (HOSPITAL_COMMUNITY)
Admission: RE | Disposition: A | Discharge status: Home / Self Care | Payer: Self-pay | Source: Ambulatory Visit | Attending: Interventional Radiology

## 2024-03-23 ENCOUNTER — Ambulatory Visit (HOSPITAL_COMMUNITY)
Admission: RE | Admit: 2024-03-23 | Discharge: 2024-03-23 | Disposition: A | Discharge status: Home / Self Care | Source: Ambulatory Visit | Attending: Interventional Radiology | Admitting: Interventional Radiology

## 2024-03-23 ENCOUNTER — Ambulatory Visit (HOSPITAL_BASED_OUTPATIENT_CLINIC_OR_DEPARTMENT_OTHER): Admitting: Registered Nurse

## 2024-03-23 ENCOUNTER — Ambulatory Visit (HOSPITAL_COMMUNITY)

## 2024-03-23 DIAGNOSIS — Z7984 Long term (current) use of oral hypoglycemic drugs: Secondary | ICD-10-CM | POA: Insufficient documentation

## 2024-03-23 DIAGNOSIS — Z79899 Other long term (current) drug therapy: Secondary | ICD-10-CM | POA: Diagnosis not present

## 2024-03-23 DIAGNOSIS — I5020 Unspecified systolic (congestive) heart failure: Secondary | ICD-10-CM | POA: Diagnosis not present

## 2024-03-23 DIAGNOSIS — K769 Liver disease, unspecified: Secondary | ICD-10-CM

## 2024-03-23 DIAGNOSIS — E1122 Type 2 diabetes mellitus with diabetic chronic kidney disease: Secondary | ICD-10-CM | POA: Diagnosis not present

## 2024-03-23 DIAGNOSIS — F17210 Nicotine dependence, cigarettes, uncomplicated: Secondary | ICD-10-CM

## 2024-03-23 DIAGNOSIS — C220 Liver cell carcinoma: Principal | ICD-10-CM | POA: Diagnosis present

## 2024-03-23 DIAGNOSIS — I509 Heart failure, unspecified: Secondary | ICD-10-CM | POA: Diagnosis not present

## 2024-03-23 DIAGNOSIS — Z01818 Encounter for other preprocedural examination: Secondary | ICD-10-CM | POA: Diagnosis not present

## 2024-03-23 DIAGNOSIS — I2510 Atherosclerotic heart disease of native coronary artery without angina pectoris: Secondary | ICD-10-CM | POA: Insufficient documentation

## 2024-03-23 DIAGNOSIS — E119 Type 2 diabetes mellitus without complications: Secondary | ICD-10-CM

## 2024-03-23 DIAGNOSIS — J449 Chronic obstructive pulmonary disease, unspecified: Secondary | ICD-10-CM

## 2024-03-23 DIAGNOSIS — I130 Hypertensive heart and chronic kidney disease with heart failure and stage 1 through stage 4 chronic kidney disease, or unspecified chronic kidney disease: Secondary | ICD-10-CM | POA: Diagnosis not present

## 2024-03-23 DIAGNOSIS — I110 Hypertensive heart disease with heart failure: Secondary | ICD-10-CM | POA: Diagnosis not present

## 2024-03-23 DIAGNOSIS — K219 Gastro-esophageal reflux disease without esophagitis: Secondary | ICD-10-CM | POA: Diagnosis not present

## 2024-03-23 DIAGNOSIS — N189 Chronic kidney disease, unspecified: Secondary | ICD-10-CM | POA: Insufficient documentation

## 2024-03-23 DIAGNOSIS — K7689 Other specified diseases of liver: Secondary | ICD-10-CM | POA: Diagnosis present

## 2024-03-23 HISTORY — PX: RADIOLOGY WITH ANESTHESIA: SHX6223

## 2024-03-23 LAB — COMPREHENSIVE METABOLIC PANEL WITH GFR
ALT: 17 U/L (ref 0–44)
AST: 38 U/L (ref 15–41)
Albumin: 3 g/dL — ABNORMAL LOW (ref 3.5–5.0)
Alkaline Phosphatase: 171 U/L — ABNORMAL HIGH (ref 38–126)
Anion gap: 7 (ref 5–15)
BUN: 19 mg/dL (ref 8–23)
CO2: 24 mmol/L (ref 22–32)
Calcium: 9.2 mg/dL (ref 8.9–10.3)
Chloride: 109 mmol/L (ref 98–111)
Creatinine, Ser: 1.32 mg/dL — ABNORMAL HIGH (ref 0.44–1.00)
GFR, Estimated: 45 mL/min — ABNORMAL LOW (ref >60)
Glucose, Bld: 127 mg/dL — ABNORMAL HIGH (ref 70–99)
Potassium: 3.9 mmol/L (ref 3.5–5.1)
Sodium: 140 mmol/L (ref 135–145)
Total Bilirubin: 1.1 mg/dL (ref 0.0–1.2)
Total Protein: 7.1 g/dL (ref 6.5–8.1)

## 2024-03-23 LAB — CBC WITH DIFFERENTIAL/PLATELET
Abs Immature Granulocytes: 0.01 10*3/uL (ref 0.00–0.07)
Basophils Absolute: 0 10*3/uL (ref 0.0–0.1)
Basophils Relative: 1 %
Eosinophils Absolute: 0.2 10*3/uL (ref 0.0–0.5)
Eosinophils Relative: 6 %
HCT: 30.5 % — ABNORMAL LOW (ref 36.0–46.0)
Hemoglobin: 10.1 g/dL — ABNORMAL LOW (ref 12.0–15.0)
Immature Granulocytes: 0 %
Lymphocytes Relative: 34 %
Lymphs Abs: 1 10*3/uL (ref 0.7–4.0)
MCH: 34.8 pg — ABNORMAL HIGH (ref 26.0–34.0)
MCHC: 33.1 g/dL (ref 30.0–36.0)
MCV: 105.2 fL — ABNORMAL HIGH (ref 80.0–100.0)
Monocytes Absolute: 0.3 10*3/uL (ref 0.1–1.0)
Monocytes Relative: 12 %
Neutro Abs: 1.4 10*3/uL — ABNORMAL LOW (ref 1.7–7.7)
Neutrophils Relative %: 47 %
Platelets: 70 10*3/uL — ABNORMAL LOW (ref 150–400)
RBC: 2.9 MIL/uL — ABNORMAL LOW (ref 3.87–5.11)
RDW: 13.9 % (ref 11.5–15.5)
WBC: 2.9 10*3/uL — ABNORMAL LOW (ref 4.0–10.5)
nRBC: 0 % (ref 0.0–0.2)

## 2024-03-23 LAB — MRSA NEXT GEN BY PCR, NASAL: MRSA by PCR Next Gen: NOT DETECTED

## 2024-03-23 LAB — GLUCOSE, CAPILLARY
Glucose-Capillary: 125 mg/dL — ABNORMAL HIGH (ref 70–99)
Glucose-Capillary: 196 mg/dL — ABNORMAL HIGH (ref 70–99)

## 2024-03-23 SURGERY — IR WITH ANESTHESIA
Anesthesia: General

## 2024-03-23 MED ORDER — PROPOFOL 10 MG/ML IV BOLUS
INTRAVENOUS | Status: DC | PRN
  Administered 2024-03-23: 120 mg via INTRAVENOUS

## 2024-03-23 MED ORDER — FENTANYL CITRATE (PF) 100 MCG/2ML IJ SOLN
INTRAMUSCULAR | Status: AC
  Filled 2024-03-23: qty 2

## 2024-03-23 MED ORDER — METHOCARBAMOL 500 MG PO TABS: 500.0000 mg | ORAL_TABLET | Freq: Four times a day (QID) | ORAL | Status: DC | PRN

## 2024-03-23 MED ORDER — FENTANYL CITRATE PF 50 MCG/ML IJ SOSY
25.0000 ug | PREFILLED_SYRINGE | INTRAMUSCULAR | Status: DC | PRN
  Administered 2024-03-23: 50 ug via INTRAVENOUS

## 2024-03-23 MED ORDER — CHLORHEXIDINE GLUCONATE CLOTH 2 % EX PADS: 6.0000 | MEDICATED_PAD | Freq: Every day | CUTANEOUS | Status: DC

## 2024-03-23 MED ORDER — EPHEDRINE SULFATE-NACL 50-0.9 MG/10ML-% IV SOSY
PREFILLED_SYRINGE | INTRAVENOUS | Status: DC | PRN
  Administered 2024-03-23: 10 mg via INTRAVENOUS
  Administered 2024-03-23: 5 mg via INTRAVENOUS

## 2024-03-23 MED ORDER — LABETALOL HCL 5 MG/ML IV SOLN: 5.0000 mg | Freq: Once | INTRAVENOUS | Status: DC

## 2024-03-23 MED ORDER — SODIUM CHLORIDE 0.9 % IV SOLN
2.0000 g | Freq: Once | INTRAVENOUS | Status: AC
  Administered 2024-03-23: 2 g via INTRAVENOUS
  Filled 2024-03-23: qty 2

## 2024-03-23 MED ORDER — METFORMIN HCL 500 MG PO TABS
500.0000 mg | ORAL_TABLET | Freq: Every day | ORAL | Status: DC
  Administered 2024-03-24: 500 mg via ORAL
  Filled 2024-03-23: qty 1

## 2024-03-23 MED ORDER — ONDANSETRON HCL 4 MG/2ML IJ SOLN
4.0000 mg | Freq: Four times a day (QID) | INTRAMUSCULAR | Status: DC | PRN
  Administered 2024-03-23 – 2024-03-24 (×2): 4 mg via INTRAVENOUS
  Filled 2024-03-23 (×2): qty 2

## 2024-03-23 MED ORDER — LABETALOL HCL 5 MG/ML IV SOLN
INTRAVENOUS | Status: AC
Start: 2024-03-23 — End: 2024-03-24
  Filled 2024-03-23: qty 4

## 2024-03-23 MED ORDER — HYDROMORPHONE HCL 1 MG/ML IJ SOLN
INTRAMUSCULAR | Status: AC
  Administered 2024-03-23: 0.5 mg via INTRAVENOUS
  Filled 2024-03-23: qty 1

## 2024-03-23 MED ORDER — HYDROXYZINE HCL 10 MG PO TABS: 10.0000 mg | ORAL_TABLET | Freq: Every evening | ORAL | Status: DC | PRN

## 2024-03-23 MED ORDER — LABETALOL HCL 5 MG/ML IV SOLN
10.0000 mg | Freq: Once | INTRAVENOUS | Status: AC
  Administered 2024-03-23: 10 mg via INTRAVENOUS

## 2024-03-23 MED ORDER — OXYCODONE HCL 5 MG PO TABS: 10.0000 mg | ORAL_TABLET | Freq: Four times a day (QID) | ORAL | Status: DC | PRN

## 2024-03-23 MED ORDER — PANTOPRAZOLE SODIUM 40 MG PO TBEC
40.0000 mg | DELAYED_RELEASE_TABLET | Freq: Every day | ORAL | Status: DC
  Administered 2024-03-24: 40 mg via ORAL
  Filled 2024-03-23: qty 1

## 2024-03-23 MED ORDER — HYDRALAZINE HCL 20 MG/ML IJ SOLN
INTRAMUSCULAR | Status: AC
  Filled 2024-03-23: qty 1

## 2024-03-23 MED ORDER — NADOLOL 20 MG PO TABS: 40.0000 mg | ORAL_TABLET | Freq: Every day | ORAL | Status: DC

## 2024-03-23 MED ORDER — DEXAMETHASONE SODIUM PHOSPHATE 10 MG/ML IJ SOLN
INTRAMUSCULAR | Status: DC | PRN
  Administered 2024-03-23: 5 mg via INTRAVENOUS

## 2024-03-23 MED ORDER — MIRTAZAPINE 15 MG PO TABS
15.0000 mg | ORAL_TABLET | Freq: Every day | ORAL | Status: DC
  Administered 2024-03-23: 15 mg via ORAL
  Filled 2024-03-23: qty 1

## 2024-03-23 MED ORDER — OXYCODONE HCL 5 MG PO TABS
5.0000 mg | ORAL_TABLET | Freq: Four times a day (QID) | ORAL | Status: DC | PRN
  Administered 2024-03-23: 10 mg via ORAL
  Filled 2024-03-23: qty 2

## 2024-03-23 MED ORDER — MIDAZOLAM HCL 5 MG/5ML IJ SOLN
INTRAMUSCULAR | Status: DC | PRN
  Administered 2024-03-23: 1 mg via INTRAVENOUS

## 2024-03-23 MED ORDER — CHLORHEXIDINE GLUCONATE 0.12 % MT SOLN
15.0000 mL | Freq: Once | OROMUCOSAL | Status: AC
  Administered 2024-03-23: 15 mL via OROMUCOSAL

## 2024-03-23 MED ORDER — ONDANSETRON HCL 4 MG/2ML IJ SOLN
INTRAMUSCULAR | Status: AC
  Administered 2024-03-23: 4 mg via INTRAVENOUS
  Filled 2024-03-23: qty 2

## 2024-03-23 MED ORDER — IOHEXOL 300 MG/ML  SOLN
140.0000 mL | Freq: Once | INTRAMUSCULAR | Status: AC | PRN
  Administered 2024-03-23: 140 mL via INTRAVENOUS

## 2024-03-23 MED ORDER — HYDROCODONE-ACETAMINOPHEN 5-325 MG PO TABS: 1.0000 | ORAL_TABLET | ORAL | Status: DC | PRN

## 2024-03-23 MED ORDER — SENNOSIDES-DOCUSATE SODIUM 8.6-50 MG PO TABS: 1.0000 | ORAL_TABLET | Freq: Every day | ORAL | Status: DC | PRN

## 2024-03-23 MED ORDER — LIDOCAINE 2% (20 MG/ML) 5 ML SYRINGE
INTRAMUSCULAR | Status: DC | PRN
  Administered 2024-03-23: 60 mg via INTRAVENOUS

## 2024-03-23 MED ORDER — LACTATED RINGERS IV SOLN: INTRAVENOUS | Status: DC

## 2024-03-23 MED ORDER — LABETALOL HCL 5 MG/ML IV SOLN
INTRAVENOUS | Status: AC
  Administered 2024-03-23: 5 mg via INTRAVENOUS
  Filled 2024-03-23: qty 4

## 2024-03-23 MED ORDER — HYDRALAZINE HCL 20 MG/ML IJ SOLN
10.0000 mg | Freq: Once | INTRAMUSCULAR | Status: AC
  Administered 2024-03-23: 10 mg via INTRAVENOUS

## 2024-03-23 MED ORDER — MOMETASONE FURO-FORMOTEROL FUM 100-5 MCG/ACT IN AERO
2.0000 | INHALATION_SPRAY | Freq: Every day | RESPIRATORY_TRACT | Status: DC
Start: 2024-03-23 — End: 2024-03-24
  Administered 2024-03-24: 2 via RESPIRATORY_TRACT
  Filled 2024-03-23: qty 8.8

## 2024-03-23 MED ORDER — ROCURONIUM BROMIDE 10 MG/ML (PF) SYRINGE
PREFILLED_SYRINGE | INTRAVENOUS | Status: DC | PRN
  Administered 2024-03-23: 50 mg via INTRAVENOUS
  Administered 2024-03-23 (×2): 5 mg via INTRAVENOUS
  Administered 2024-03-23: 10 mg via INTRAVENOUS

## 2024-03-23 MED ORDER — HYDROMORPHONE HCL 1 MG/ML IJ SOLN
0.2500 mg | INTRAMUSCULAR | Status: DC | PRN
  Administered 2024-03-23 (×2): 0.5 mg via INTRAVENOUS

## 2024-03-23 MED ORDER — HYDRALAZINE HCL 20 MG/ML IJ SOLN
10.0000 mg | INTRAMUSCULAR | Status: DC | PRN
  Administered 2024-03-23 – 2024-03-24 (×2): 10 mg via INTRAVENOUS
  Filled 2024-03-23 (×2): qty 1

## 2024-03-23 MED ORDER — PHENYLEPHRINE HCL-NACL 20-0.9 MG/250ML-% IV SOLN
INTRAVENOUS | Status: DC | PRN
Start: 2024-03-23 — End: 2024-03-23
  Administered 2024-03-23: 25 ug/min via INTRAVENOUS

## 2024-03-23 MED ORDER — FESOTERODINE FUMARATE ER 4 MG PO TB24
4.0000 mg | ORAL_TABLET | Freq: Every day | ORAL | Status: DC
  Administered 2024-03-24: 4 mg via ORAL
  Filled 2024-03-23: qty 1

## 2024-03-23 MED ORDER — MIDAZOLAM HCL 2 MG/2ML IJ SOLN
INTRAMUSCULAR | Status: AC
  Filled 2024-03-23: qty 2

## 2024-03-23 MED ORDER — SODIUM CHLORIDE (PF) 0.9 % IJ SOLN
INTRAMUSCULAR | Status: AC
  Filled 2024-03-23: qty 50

## 2024-03-23 MED ORDER — FENTANYL CITRATE (PF) 100 MCG/2ML IJ SOLN
INTRAMUSCULAR | Status: DC | PRN
  Administered 2024-03-23 (×2): 25 ug via INTRAVENOUS
  Administered 2024-03-23 (×3): 50 ug via INTRAVENOUS

## 2024-03-23 MED ORDER — BUDESON-GLYCOPYRROL-FORMOTEROL 160-9-4.8 MCG/ACT IN AERO: 2.0000 | INHALATION_SPRAY | Freq: Every day | RESPIRATORY_TRACT | Status: DC

## 2024-03-23 MED ORDER — MORPHINE SULFATE (PF) 2 MG/ML IV SOLN
2.0000 mg | INTRAVENOUS | Status: DC | PRN
  Administered 2024-03-23: 2 mg via INTRAVENOUS
  Filled 2024-03-23: qty 1

## 2024-03-23 MED ORDER — ORAL CARE MOUTH RINSE: 15.0000 mL | Freq: Once | OROMUCOSAL | Status: AC

## 2024-03-23 MED ORDER — LABETALOL HCL 5 MG/ML IV SOLN: 5.0000 mg | INTRAVENOUS | Status: DC | PRN

## 2024-03-23 MED ORDER — NADOLOL 20 MG PO TABS
40.0000 mg | ORAL_TABLET | Freq: Every day | ORAL | Status: DC
  Administered 2024-03-23 – 2024-03-24 (×2): 40 mg via ORAL
  Filled 2024-03-23 (×2): qty 2

## 2024-03-23 MED ORDER — FENTANYL CITRATE PF 50 MCG/ML IJ SOSY
PREFILLED_SYRINGE | INTRAMUSCULAR | Status: AC
  Administered 2024-03-23: 50 ug via INTRAVENOUS
  Filled 2024-03-23: qty 1

## 2024-03-23 MED ORDER — ONDANSETRON HCL 4 MG/2ML IJ SOLN
INTRAMUSCULAR | Status: DC | PRN
Start: 2024-03-23 — End: 2024-03-23
  Administered 2024-03-23: 4 mg via INTRAVENOUS

## 2024-03-23 MED ORDER — INSULIN ASPART 100 UNIT/ML IJ SOLN: 0.0000 [IU] | INTRAMUSCULAR | Status: DC | PRN

## 2024-03-23 MED ORDER — DOCUSATE SODIUM 100 MG PO CAPS
100.0000 mg | ORAL_CAPSULE | Freq: Two times a day (BID) | ORAL | Status: DC
  Administered 2024-03-23 – 2024-03-24 (×2): 100 mg via ORAL
  Filled 2024-03-23 (×2): qty 1

## 2024-03-23 MED ORDER — SPIRONOLACTONE 25 MG PO TABS
25.0000 mg | ORAL_TABLET | Freq: Every day | ORAL | Status: DC
  Administered 2024-03-24: 25 mg via ORAL
  Filled 2024-03-23: qty 1

## 2024-03-23 MED ORDER — THIAMINE MONONITRATE 100 MG PO TABS
100.0000 mg | ORAL_TABLET | Freq: Every day | ORAL | Status: DC
  Administered 2024-03-23 – 2024-03-24 (×2): 100 mg via ORAL
  Filled 2024-03-23 (×2): qty 1

## 2024-03-23 MED ORDER — UMECLIDINIUM BROMIDE 62.5 MCG/ACT IN AEPB
1.0000 | INHALATION_SPRAY | Freq: Every day | RESPIRATORY_TRACT | Status: DC
  Administered 2024-03-24: 1 via RESPIRATORY_TRACT
  Filled 2024-03-23: qty 7

## 2024-03-23 MED ORDER — FENTANYL CITRATE PF 50 MCG/ML IJ SOSY
PREFILLED_SYRINGE | INTRAMUSCULAR | Status: AC
  Administered 2024-03-23: 50 ug via INTRAVENOUS
  Filled 2024-03-23: qty 2

## 2024-03-23 MED ORDER — SUGAMMADEX SODIUM 200 MG/2ML IV SOLN
INTRAVENOUS | Status: DC | PRN
  Administered 2024-03-23: 200 mg via INTRAVENOUS

## 2024-03-23 MED ORDER — HYDRALAZINE HCL 20 MG/ML IJ SOLN
10.0000 mg | Freq: Once | INTRAMUSCULAR | Status: AC
Start: 2024-03-23 — End: 2024-03-23
  Administered 2024-03-23: 10 mg via INTRAVENOUS

## 2024-03-23 MED ORDER — SODIUM CHLORIDE 0.9 % IV SOLN
INTRAVENOUS | Status: AC
  Filled 2024-03-23: qty 1000

## 2024-03-23 MED ORDER — NALTREXONE HCL 50 MG PO TABS
50.0000 mg | ORAL_TABLET | Freq: Every day | ORAL | Status: DC
  Administered 2024-03-23 – 2024-03-24 (×2): 50 mg via ORAL
  Filled 2024-03-23 (×2): qty 1

## 2024-03-23 NOTE — Sedation Documentation (Signed)
 Anesthesia in to sedate and monitor.

## 2024-03-23 NOTE — Anesthesia Procedure Notes (Signed)
 Procedure Name: Intubation Date/Time: 03/23/2024 9:21 AM  Performed by: Elisabeth Cara, CRNAPre-anesthesia Checklist: Patient identified, Emergency Drugs available, Patient being monitored, Timeout performed and Suction available Patient Re-evaluated:Patient Re-evaluated prior to induction Oxygen Delivery Method: Circle system utilized Preoxygenation: Pre-oxygenation with 100% oxygen Induction Type: IV induction Ventilation: Mask ventilation without difficulty Laryngoscope Size: Mac and 3 Grade View: Grade II Tube type: Oral Tube size: 7.0 mm Number of attempts: 1 Airway Equipment and Method: Stylet and Oral airway Placement Confirmation: ETT inserted through vocal cords under direct vision, positive ETCO2 and breath sounds checked- equal and bilateral Secured at: 21 cm Tube secured with: Tape Dental Injury: Teeth and Oropharynx as per pre-operative assessment

## 2024-03-23 NOTE — Procedures (Addendum)
  Procedure:  CT RF (MW) ablation liver x2 Left and right HCCa Preprocedure diagnosis: The encounter diagnosis was Liver lesion. Postprocedure diagnosis: same EBL:    minimal Complications:   none immediate  See full dictation in [employer].  Thora Lance MD Main # [phone] Pager  [phone] Mobile [phone]

## 2024-03-23 NOTE — Anesthesia Preprocedure Evaluation (Addendum)
 Anesthesia Evaluation  Patient identified by MRN, date of birth, ID band Patient awake    Reviewed: Allergy & Precautions, NPO status , Patient's Chart, lab work & pertinent test results  Airway Mallampati: II  TM Distance: >3 FB Neck ROM: Full    Dental  (+) Partial Upper   Pulmonary asthma , COPD, Current Smoker and Patient abstained from smoking.   Pulmonary exam normal        Cardiovascular hypertension, Pt. on medications and Pt. on home beta blockers +CHF   Rhythm:Regular Rate:Normal     Neuro/Psych  Headaches  Anxiety Depression       GI/Hepatic PUD,GERD  ,,(+) Hepatitis -, C  Endo/Other  diabetes, Type 2, Oral Hypoglycemic Agents    Renal/GU   negative genitourinary   Musculoskeletal  (+) Arthritis , Osteoarthritis,    Abdominal Normal abdominal exam  (+)   Peds  Hematology  (+) Blood dyscrasia, anemia Lab Results      Component                Value               Date                      WBC                      3.5 (L)             03/16/2024                HGB                      10.2 (L)            03/16/2024                HCT                      32.3 (L)            03/16/2024                MCV                      107.7 (H)           03/16/2024                PLT                      68 (L)              03/16/2024              Anesthesia Other Findings   Reproductive/Obstetrics                             Anesthesia Physical Anesthesia Plan  ASA: 3  Anesthesia Plan: General   Post-op Pain Management:    Induction: Intravenous  PONV Risk Score and Plan: 2 and Ondansetron, Dexamethasone and Treatment may vary due to age or medical condition  Airway Management Planned: Mask and Oral ETT  Additional Equipment: None  Intra-op Plan:   Post-operative Plan: Extubation in OR  Informed Consent: I have reviewed the patients History and Physical, chart, labs and  discussed the procedure including the risks, benefits and alternatives for the proposed anesthesia  with the patient or authorized representative who has indicated his/her understanding and acceptance.     Dental advisory given  Plan Discussed with: CRNA  Anesthesia Plan Comments:        Anesthesia Quick Evaluation

## 2024-03-23 NOTE — Progress Notes (Signed)
   03/23/24 2158  Assess: MEWS Score  Temp 98.6 F (37 C)  BP (!) 216/94  MAP (mmHg) 127  Pulse Rate 67  Resp 20  SpO2 99 %  O2 Device Room Air  Assess: MEWS Score  MEWS Temp 0  MEWS Systolic 2  MEWS Pulse 0  MEWS RR 0  MEWS LOC 0  MEWS Score 2  MEWS Score Color Yellow  Assess: if the MEWS score is Yellow or Red  Were vital signs accurate and taken at a resting state? Yes  Does the patient meet 2 or more of the SIRS criteria? No  MEWS guidelines implemented  Yes, yellow  Treat  MEWS Interventions Considered administering scheduled or prn medications/treatments as ordered  Take Vital Signs  Increase Vital Sign Frequency  Yellow: Q2hr x1, continue Q4hrs until patient remains green for 12hrs  Escalate  MEWS: Escalate Yellow: Discuss with charge nurse and consider notifying provider and/or RRT  Notify: Charge Nurse/RN  Name of Charge Nurse/RN Notified Joanna, RN  Assess: SIRS CRITERIA  SIRS Temperature  0  SIRS Respirations  0  SIRS Pulse 0  SIRS WBC 0  SIRS Score Sum  0

## 2024-03-23 NOTE — Transfer of Care (Signed)
 Immediate Anesthesia Transfer of Care Note  Patient: Gabriella Leon  Procedure(s) Performed: Microwave ablation CT GUIDE TISSUE ABLATION CT BIOPSY  Patient Location: PACU  Anesthesia Type:General  Level of Consciousness: awake, alert , oriented, and patient cooperative  Airway & Oxygen Therapy: Patient Spontanous Breathing and Patient connected to face mask oxygen  Post-op Assessment: Report given to RN, Post -op Vital signs reviewed and stable, and Patient moving all extremities  Post vital signs: Reviewed and stable  Last Vitals:  Vitals Value Taken Time  BP    Temp    Pulse    Resp    SpO2      Last Pain:  Vitals:   03/23/24 0701  TempSrc: Oral  PainSc: 0-No pain         Complications: No notable events documented.

## 2024-03-24 ENCOUNTER — Encounter (HOSPITAL_COMMUNITY): Payer: Self-pay | Admitting: Radiology

## 2024-03-24 DIAGNOSIS — K7689 Other specified diseases of liver: Secondary | ICD-10-CM | POA: Diagnosis not present

## 2024-03-24 DIAGNOSIS — Z79899 Other long term (current) drug therapy: Secondary | ICD-10-CM | POA: Diagnosis not present

## 2024-03-24 DIAGNOSIS — Z7984 Long term (current) use of oral hypoglycemic drugs: Secondary | ICD-10-CM | POA: Diagnosis not present

## 2024-03-24 DIAGNOSIS — E1122 Type 2 diabetes mellitus with diabetic chronic kidney disease: Secondary | ICD-10-CM | POA: Diagnosis not present

## 2024-03-24 DIAGNOSIS — J449 Chronic obstructive pulmonary disease, unspecified: Secondary | ICD-10-CM | POA: Diagnosis not present

## 2024-03-24 DIAGNOSIS — N189 Chronic kidney disease, unspecified: Secondary | ICD-10-CM | POA: Diagnosis not present

## 2024-03-24 DIAGNOSIS — I5020 Unspecified systolic (congestive) heart failure: Secondary | ICD-10-CM | POA: Diagnosis not present

## 2024-03-24 DIAGNOSIS — F17210 Nicotine dependence, cigarettes, uncomplicated: Secondary | ICD-10-CM | POA: Diagnosis not present

## 2024-03-24 DIAGNOSIS — K219 Gastro-esophageal reflux disease without esophagitis: Secondary | ICD-10-CM | POA: Diagnosis not present

## 2024-03-24 DIAGNOSIS — I130 Hypertensive heart and chronic kidney disease with heart failure and stage 1 through stage 4 chronic kidney disease, or unspecified chronic kidney disease: Secondary | ICD-10-CM | POA: Diagnosis not present

## 2024-03-24 DIAGNOSIS — I2510 Atherosclerotic heart disease of native coronary artery without angina pectoris: Secondary | ICD-10-CM | POA: Diagnosis not present

## 2024-03-24 DIAGNOSIS — C220 Liver cell carcinoma: Secondary | ICD-10-CM | POA: Diagnosis not present

## 2024-03-24 LAB — CBC WITH DIFFERENTIAL/PLATELET
Abs Immature Granulocytes: 0.04 10*3/uL (ref 0.00–0.07)
Basophils Absolute: 0 10*3/uL (ref 0.0–0.1)
Basophils Relative: 0 %
Eosinophils Absolute: 0 10*3/uL (ref 0.0–0.5)
Eosinophils Relative: 0 %
HCT: 38.4 % (ref 36.0–46.0)
Hemoglobin: 13.2 g/dL (ref 12.0–15.0)
Immature Granulocytes: 0 %
Lymphocytes Relative: 9 %
Lymphs Abs: 0.9 10*3/uL (ref 0.7–4.0)
MCH: 34.7 pg — ABNORMAL HIGH (ref 26.0–34.0)
MCHC: 34.4 g/dL (ref 30.0–36.0)
MCV: 101.1 fL — ABNORMAL HIGH (ref 80.0–100.0)
Monocytes Absolute: 0.8 10*3/uL (ref 0.1–1.0)
Monocytes Relative: 8 %
Neutro Abs: 8.1 10*3/uL — ABNORMAL HIGH (ref 1.7–7.7)
Neutrophils Relative %: 83 %
Platelets: 109 10*3/uL — ABNORMAL LOW (ref 150–400)
RBC: 3.8 MIL/uL — ABNORMAL LOW (ref 3.87–5.11)
RDW: 13.5 % (ref 11.5–15.5)
WBC: 9.9 10*3/uL (ref 4.0–10.5)
nRBC: 0 % (ref 0.0–0.2)

## 2024-03-24 LAB — COMPREHENSIVE METABOLIC PANEL WITH GFR
ALT: 56 U/L — ABNORMAL HIGH (ref 0–44)
AST: 242 U/L — ABNORMAL HIGH (ref 15–41)
Albumin: 3.5 g/dL (ref 3.5–5.0)
Alkaline Phosphatase: 124 U/L (ref 38–126)
Anion gap: 8 (ref 5–15)
BUN: 21 mg/dL (ref 8–23)
CO2: 25 mmol/L (ref 22–32)
Calcium: 9.4 mg/dL (ref 8.9–10.3)
Chloride: 99 mmol/L (ref 98–111)
Creatinine, Ser: 1.29 mg/dL — ABNORMAL HIGH (ref 0.44–1.00)
GFR, Estimated: 46 mL/min — ABNORMAL LOW (ref >60)
Glucose, Bld: 143 mg/dL — ABNORMAL HIGH (ref 70–99)
Potassium: 3.7 mmol/L (ref 3.5–5.1)
Sodium: 132 mmol/L — ABNORMAL LOW (ref 135–145)
Total Bilirubin: 2.8 mg/dL — ABNORMAL HIGH (ref 0.0–1.2)
Total Protein: 8.4 g/dL — ABNORMAL HIGH (ref 6.5–8.1)

## 2024-03-24 LAB — GLUCOSE, CAPILLARY: Glucose-Capillary: 136 mg/dL — ABNORMAL HIGH (ref 70–99)

## 2024-03-24 MED ORDER — ONDANSETRON HCL 4 MG PO TABS: 4.0000 mg | ORAL_TABLET | Freq: Three times a day (TID) | ORAL | 0 refills | Status: AC | PRN

## 2024-03-24 NOTE — Care Management Obs Status (Signed)
 MEDICARE OBSERVATION STATUS NOTIFICATION   Patient Details  Name: Gabriella Leon MRN: [id number] Date of Birth: 1956-07-31   Medicare Observation Status Notification Given:  Yes    Ewing Schlein, LCSW 03/24/2024, 1:10 PM

## 2024-03-24 NOTE — Discharge Instructions (Signed)
 May resume home medications, avoid strenuous activity/heavy lifting for 1 week, stay well-hydrated, contact primary physician to follow-up with your blood pressure monitoring; call [phone] with any questions related to your recent liver procedure

## 2024-03-24 NOTE — Anesthesia Postprocedure Evaluation (Signed)
 Anesthesia Post Note  Patient: Gabriella Leon  Procedure(s) Performed: Microwave ablation CT GUIDE TISSUE ABLATION     Patient location during evaluation: PACU Anesthesia Type: General Level of consciousness: awake and alert Pain management: pain level controlled Vital Signs Assessment: post-procedure vital signs reviewed and stable Respiratory status: spontaneous breathing, nonlabored ventilation, respiratory function stable and patient connected to nasal cannula oxygen Cardiovascular status: blood pressure returned to baseline and stable Postop Assessment: no apparent nausea or vomiting Anesthetic complications: no   No notable events documented.  Last Vitals:  Vitals:   03/24/24 0841 03/24/24 0857  BP: 123/62   Pulse: 70   Resp:    Temp:    SpO2:  96%    Last Pain:  Vitals:   03/24/24 0411  TempSrc: Oral  PainSc:                  Nelle Don Mikaelah Trostle

## 2024-03-24 NOTE — TOC CM/SW Note (Signed)
 Transition of Care [facility]) - Inpatient Brief Assessment  Patient Details  Name: Gabriella Leon MRN: [id number] Date of Birth: 04-15-1956  [facility] of [facility]) CM/SW Contact:    Ewing Schlein, LCSW Phone Number: 03/24/2024, 9:02 AM  Clinical Narrative: SDOH screening indicated need for transportation assistance. Information for Medicaid transportation through [facility] added to AVS.  Transition of Care Asessment: Insurance and Status: Insurance coverage has been reviewed Patient has primary care physician: Yes Home environment has been reviewed: Resides alone in single family home Prior level of function:: Independent with ADLs at baseline Prior/Current Home Services: No current home services Social Drivers of Health Review: SDOH reviewed interventions complete (Medicaid transportation information added to AVS.) Readmission risk has been reviewed: Yes Transition of care needs: no transition of care needs at this time

## 2024-03-24 NOTE — Discharge Summary (Signed)
 Patient ID: Brylie Sneath MRN: [id number] DOB/AGE: 25-Jan-1956 68 y.o.  Admit date: 03/23/2024 Discharge date: 03/24/2024  Supervising Physician: Oley Balm  Patient Status: [facility] - In-pt  Admission Diagnoses: Multiple liver lesions concerning for multifocal hepatocellular carcinoma   Discharge Diagnoses: Multiple liver lesions concerning for multifocal hepatocellular carcinoma; s/p  1. Successful CT- and ultrasound-guided percutaneous thermal ablation of left segment 2/3 hepatocellular carcinoma on 03/23/24 2. Successful CT- and ultrasound-guided percutaneous thermal ablation of right segment 8 hepatocellular carcinoma  on 03/23/24  Past Medical History:  Diagnosis Date   Acid reflux disease with ulcer    Anxiety    Arthritis    Asthma    Blood dyscrasia    thrombocytopenia-next tx injection 09/25/15   Blood transfusion    CHF (congestive heart failure) (HCC) 2021   Chronic kidney disease    Cirrhosis (HCC)    COPD (chronic obstructive pulmonary disease) (HCC)    DDD (degenerative disc disease), lumbar    Depression    Diabetes mellitus    Dyspnea    Esophageal varices (HCC) 09/18/2014   Folate deficiency 03/28/2014   Noted on 03/23/2014.  Folate 1 mg prescribed.   Gallstones    GERD (gastroesophageal reflux disease)    Headache    migraines   Helicobacter pylori ab+    July 2010, s/p treatment   Hepatitis B antibody positive    Hepatitis C    and B positive antibody   History of alcoholism (HCC)    History of kidney stones    Hypertension    Left leg pain    Liver cirrhosis (HCC)    Low back pain    Other pancytopenia (HCC) 10/27/2013   Ulcer    ?   Past Surgical History:  Procedure Laterality Date   ANTERIOR CERVICAL DECOMP/DISCECTOMY FUSION N/A 09/28/2015   Procedure: Cervical three-four Anterior cervical decompression/diskectomy/fusion;  Surgeon: Hilda Lias, MD;  Location: [facility];  Service: Neurosurgery;  Laterality: N/A;  C3-4 Anterior  cervical decompression/diskectomy/fusion   APPENDECTOMY     age 39   BIOPSY  12/18/2011       BIOPSY  05/19/2019   Procedure: BIOPSY;  Surgeon: Corbin Ade, MD;  Location: [facility];  Service: Endoscopy;;  gastric   CESAREAN SECTION  1988   [facility]   CHOLECYSTECTOMY  greater than 10 yrs   [facility]   COLONOSCOPY  12/18/11   minimal anal canal hemorrhoids, friable rectal and colonic mucosa, left-sided diverticulosis, repeat in 2022.    ESOPHAGOGASTRODUODENOSCOPY  12/18/11   3 columns of Grade II esophageal varices, reflux esohpagitis, portal gastropathy, path with chronic gastritis, negative H.pylori, surveillance in June 2014   ESOPHAGOGASTRODUODENOSCOPY (EGD) WITH PROPOFOL N/A 04/13/2014   Dr. Jena Gauss: grade 1-2 varices, hiatal hernia, portal gastropathy. NO need for further surveillance while on prophylaxis unless evidence of bleeding   ESOPHAGOGASTRODUODENOSCOPY (EGD) WITH PROPOFOL N/A 05/19/2019   erosive reflux esophagitis, Grade 1-2 esophageal varices with patulous EG junction. Hiatal hernia. Portal gastropathy. Gastric and duodenal dulcer disease.    MULTIPLE EXTRACTIONS WITH ALVEOLOPLASTY  03/08/2012   Procedure: MULTIPLE EXTRACION WITH ALVEOLOPLASTY;  Surgeon: Georgia Lopes, DDS;  Location: [facility] OR;  Service: Oral Surgery;  Laterality: N/A;   RADIOLOGY WITH ANESTHESIA N/A 03/23/2024   Procedure: Microwave ablation;  Surgeon: Radiologist, Medication, MD;  Location: [facility] ORS;  Service: Radiology;  Laterality: N/A;   TOTAL HIP ARTHROPLASTY Left 04/30/2021   Procedure: TOTAL HIP ARTHROPLASTY ANTERIOR APPROACH;  Surgeon: Durene Romans, MD;  Location:  [facility] ORS;  Service: Orthopedics;  Laterality: Left;       Principal Problem:   Hepatocellular carcinoma (HCC)   Discharged Condition: good  Hospital Course: Gelsey Hampton-Johnson is a 68 y.o. female smoker with PMH sig for GERD, anxiety, asthma, CHF, CKD, CAD, COPD, DDD, depression, DM, esophageal varices, hep B/C, nephrolithiasis, HTN and  cirrhosis/fatty liver/portal HTN by imaging along with enhancing liver lesions concerning for multifocal HCC. She was seen in consultation by Dr. Deanne Coffer on 02/17/24 to discuss treatment options and deemed an appropriate candidate for image guided MWA/possible biopsy of 2 LI-RADS cat 5 lesions.  On 03/23/24 she underwent successful CT- and ultrasound-guided percutaneous thermal ablation of left segment 2/3 hepatocellular carcinoma and right segment 8 hepatocellular carcinoma.  The procedure was performed under general anesthesia and without immediate complications.  She was subsequently admitted for overnight observation.  Postprocedure patient was noted to be hypertensive and treated with combination of IV labetolol and hydralazine.  She did have some persistent epigastric discomfort and was given Dilaudid and fentanyl in PACU.  Patient was transitioned to oxycodone once on the floor.  Overnight the patient did have some intermittent nausea /vomiting, treated with antiemetic.On the day of discharge she was afebrile and denied HA,CP,dyspnea, vomiting or bleeding. She did have some mild epigastric discomfort. Appetite was minimal. She was able to void, ambulate , drink without sig difficulty. She was seen by Dr. Milford Cage and deemed stable for dc home at this time. She will resume her home medications. Prescription for zofran was sent electronically to pt's pharmacy. Follow up hgb was stable, WBC normal, creat stable. Pt will be scheduled for f/u with Dr. Deanne Coffer in IR clinic in 4 weeks. She will continue oncologic care with Dr. Ellin Saba. She was encouraged to f/u with her primary MD regarding BP checks. She was told to contact our team with any questions regarding her recent liver procedure.   Consults: anesthesia  Significant Diagnostic Studies:  Results for orders placed or performed during the hospital encounter of 03/23/24  Glucose, capillary   Collection Time: 03/23/24  7:03 AM  Result Value Ref Range    Glucose-Capillary 125 (H) 70 - 99 mg/dL   Comment 1 Notify RN   CBC with Differential/Platelet   Collection Time: 03/23/24  7:25 AM  Result Value Ref Range   WBC 2.9 (L) 4.0 - 10.5 K/uL   RBC 2.90 (L) 3.87 - 5.11 MIL/uL   Hemoglobin 10.1 (L) 12.0 - 15.0 g/dL   HCT 54.0 (L) 98.1 - 19.1 %   MCV 105.2 (H) 80.0 - 100.0 fL   MCH 34.8 (H) 26.0 - 34.0 pg   MCHC 33.1 30.0 - 36.0 g/dL   RDW 47.8 29.5 - 62.1 %   Platelets 70 (L) 150 - 400 K/uL   nRBC 0.0 0.0 - 0.2 %   Neutrophils Relative % 47 %   Neutro Abs 1.4 (L) 1.7 - 7.7 K/uL   Lymphocytes Relative 34 %   Lymphs Abs 1.0 0.7 - 4.0 K/uL   Monocytes Relative 12 %   Monocytes Absolute 0.3 0.1 - 1.0 K/uL   Eosinophils Relative 6 %   Eosinophils Absolute 0.2 0.0 - 0.5 K/uL   Basophils Relative 1 %   Basophils Absolute 0.0 0.0 - 0.1 K/uL   Immature Granulocytes 0 %   Abs Immature Granulocytes 0.01 0.00 - 0.07 K/uL  Comprehensive metabolic panel   Collection Time: 03/23/24  7:25 AM  Result Value Ref Range   Sodium 140 135 -  145 mmol/L   Potassium 3.9 3.5 - 5.1 mmol/L   Chloride 109 98 - 111 mmol/L   CO2 24 22 - 32 mmol/L   Glucose, Bld 127 (H) 70 - 99 mg/dL   BUN 19 8 - 23 mg/dL   Creatinine, Ser 1.61 (H) 0.44 - 1.00 mg/dL   Calcium 9.2 8.9 - 09.6 mg/dL   Total Protein 7.1 6.5 - 8.1 g/dL   Albumin 3.0 (L) 3.5 - 5.0 g/dL   AST 38 15 - 41 U/L   ALT 17 0 - 44 U/L   Alkaline Phosphatase 171 (H) 38 - 126 U/L   Total Bilirubin 1.1 0.0 - 1.2 mg/dL   GFR, Estimated 45 (L) >60 mL/min   Anion gap 7 5 - 15  Glucose, capillary   Collection Time: 03/23/24 12:00 PM  Result Value Ref Range   Glucose-Capillary 196 (H) 70 - 99 mg/dL  MRSA Next Gen by PCR, Nasal   Collection Time: 03/23/24  7:35 PM   Specimen: Nasal Mucosa; Nasal Swab  Result Value Ref Range   MRSA by PCR Next Gen NOT DETECTED NOT DETECTED  Comprehensive metabolic panel   Collection Time: 03/24/24  4:08 AM  Result Value Ref Range   Sodium 132 (L) 135 - 145 mmol/L    Potassium 3.7 3.5 - 5.1 mmol/L   Chloride 99 98 - 111 mmol/L   CO2 25 22 - 32 mmol/L   Glucose, Bld 143 (H) 70 - 99 mg/dL   BUN 21 8 - 23 mg/dL   Creatinine, Ser 0.45 (H) 0.44 - 1.00 mg/dL   Calcium 9.4 8.9 - 40.9 mg/dL   Total Protein 8.4 (H) 6.5 - 8.1 g/dL   Albumin 3.5 3.5 - 5.0 g/dL   AST 811 (H) 15 - 41 U/L   ALT 56 (H) 0 - 44 U/L   Alkaline Phosphatase 124 38 - 126 U/L   Total Bilirubin 2.8 (H) 0.0 - 1.2 mg/dL   GFR, Estimated 46 (L) >60 mL/min   Anion gap 8 5 - 15  CBC with Differential/Platelet   Collection Time: 03/24/24  6:09 AM  Result Value Ref Range   WBC 9.9 4.0 - 10.5 K/uL   RBC 3.80 (L) 3.87 - 5.11 MIL/uL   Hemoglobin 13.2 12.0 - 15.0 g/dL   HCT 91.4 78.2 - 95.6 %   MCV 101.1 (H) 80.0 - 100.0 fL   MCH 34.7 (H) 26.0 - 34.0 pg   MCHC 34.4 30.0 - 36.0 g/dL   RDW 21.3 08.6 - 57.8 %   Platelets 109 (L) 150 - 400 K/uL   nRBC 0.0 0.0 - 0.2 %   Neutrophils Relative % 83 %   Neutro Abs 8.1 (H) 1.7 - 7.7 K/uL   Lymphocytes Relative 9 %   Lymphs Abs 0.9 0.7 - 4.0 K/uL   Monocytes Relative 8 %   Monocytes Absolute 0.8 0.1 - 1.0 K/uL   Eosinophils Relative 0 %   Eosinophils Absolute 0.0 0.0 - 0.5 K/uL   Basophils Relative 0 %   Basophils Absolute 0.0 0.0 - 0.1 K/uL   Immature Granulocytes 0 %   Abs Immature Granulocytes 0.04 0.00 - 0.07 K/uL  Glucose, capillary   Collection Time: 03/24/24  7:43 AM  Result Value Ref Range   Glucose-Capillary 136 (H) 70 - 99 mg/dL     Treatments: on 03/28/95 and via general anesthesia pt underwent: Successful CT- and ultrasound-guided percutaneous thermal ablation of left segment 2/3 hepatocellular carcinoma. Successful CT- and ultrasound-guided  percutaneous thermal ablation of right segment 8 hepatocellular carcinoma.  Discharge Exam: Blood pressure 123/62, pulse 70, temperature 98.9 F (37.2 C), temperature source Oral, resp. rate 20, height 5\' 4"  (1.626 m), weight 166 lb 0.1 oz (75.3 kg), SpO2 96%. Awake/alert; chest- CTA  bilat; heart-RRR; abd-soft,+BS,  mild-mod tender epigastric region to palpation, puncture sites clean and dry, no obvious hematomas; no LE edema    Disposition: Discharge disposition: 01-Home or Self Care       Discharge Instructions     Call MD for:  difficulty breathing, headache or visual disturbances   Complete by: As directed    Call MD for:  extreme fatigue   Complete by: As directed    Call MD for:  hives   Complete by: As directed    Call MD for:  persistant dizziness or light-headedness   Complete by: As directed    Call MD for:  persistant nausea and vomiting   Complete by: As directed    Call MD for:  redness, tenderness, or signs of infection (pain, swelling, redness, odor or green/yellow discharge around incision site)   Complete by: As directed    Call MD for:  severe uncontrolled pain   Complete by: As directed    Call MD for:  temperature >100.4   Complete by: As directed    Diet - low sodium heart healthy   Complete by: As directed    Discharge instructions   Complete by: As directed    Resume home medications, avoid strenuous activity for 1 week, stay well-hydrated   Driving Restrictions   Complete by: As directed    No driving for the next 24 hours or after taking narcotic medication   Increase activity slowly   Complete by: As directed    Lifting restrictions   Complete by: As directed    No heavy lifting for the next week   May shower / Bathe   Complete by: As directed    May walk up steps   Complete by: As directed    No dressing needed   Complete by: As directed       Allergies as of 03/24/2024   No Known Allergies      Medication List     TAKE these medications    Breztri Aerosphere 160-9-4.8 MCG/ACT Aero Generic drug: budeson-glycopyrrolate-formoterol Inhale 2 puffs into the lungs in the morning and at bedtime.   hydrOXYzine 10 MG tablet Commonly known as: ATARAX Take 10-20 mg by mouth at bedtime as needed for itching.    metFORMIN 500 MG tablet Commonly known as: GLUCOPHAGE Take 500 mg by mouth daily with breakfast.   methocarbamol 500 MG tablet Commonly known as: ROBAXIN Take 1 tablet (500 mg total) by mouth every 6 (six) hours as needed for muscle spasms.   mirtazapine 15 MG tablet Commonly known as: REMERON Take 15 mg by mouth at bedtime.   nadolol 40 MG tablet Commonly known as: CORGARD Take 40 mg by mouth daily at 12 noon.   naltrexone 50 MG tablet Commonly known as: DEPADE Take 50 mg by mouth daily.   ondansetron 4 MG tablet Commonly known as: Zofran Take 1 tablet (4 mg total) by mouth every 8 (eight) hours as needed for nausea or vomiting.   oxyCODONE 5 MG immediate release tablet Commonly known as: Oxy IR/ROXICODONE Take 1-2 tablets (5-10 mg total) by mouth every 6 (six) hours as needed for severe pain (pain score 7-10) (pain 7-10).   pantoprazole 40  MG tablet Commonly known as: PROTONIX Take 40 mg by mouth daily before breakfast.   polyethylene glycol 17 g packet Commonly known as: MIRALAX / GLYCOLAX Take 17 g by mouth daily as needed for mild constipation.   solifenacin 5 MG tablet Commonly known as: VESICARE Take 5 mg by mouth in the morning.   spironolactone 25 MG tablet Commonly known as: ALDACTONE TAKE 1 TABLET BY MOUTH DAILY What changed: when to take this   thiamine 100 MG tablet Commonly known as: Vitamin B-1 Take 100 mg by mouth daily.   Tylenol 8 Hour 650 MG CR tablet Generic drug: acetaminophen Take 650-1,300 mg by mouth every 8 (eight) hours as needed for pain.   Vitamin D (Ergocalciferol) 1.25 MG (50000 UNIT) Caps capsule Commonly known as: DRISDOL TAKE ONE CAPSULE BY MOUTH ONCE a WEEK What changed: when to take this               Discharge Care Instructions  (From admission, onward)           Start     Ordered   03/24/24 0000  No dressing needed        03/24/24 1215            Follow-up Information     [facility] DSS.  Call.   Why: Call DSS to get set up for rides to medical appointments through your Medicaid. Contact information: [phone]        Oley Balm, MD Follow up.   Specialties: Interventional Radiology, Radiology Why: Radiology team will contact you regarding follow-up with Dr. Deanne Coffer in 3- 4 weeks; call [phone] with any questions Contact information: [address] [place] [address] [place] [address] [phone]         Doreatha Massed, MD Follow up.   Specialty: Hematology Why: Follow-up with Dr. Ellin Saba as scheduled Contact information: [address] [place] [address] [phone]                  Electronically Signed: D. Jeananne Rama, PA-C 03/24/2024, 12:19 PM     I have spent Less Than 30 Minutes discharging Leanny Hampton-Johnson.

## 2024-04-20 ENCOUNTER — Other Ambulatory Visit: Payer: Self-pay | Admitting: Interventional Radiology

## 2024-04-20 ENCOUNTER — Inpatient Hospital Stay

## 2024-04-20 ENCOUNTER — Other Ambulatory Visit: Payer: Self-pay | Admitting: Oncology

## 2024-04-20 ENCOUNTER — Other Ambulatory Visit: Payer: Self-pay | Admitting: *Deleted

## 2024-04-20 ENCOUNTER — Inpatient Hospital Stay (HOSPITAL_BASED_OUTPATIENT_CLINIC_OR_DEPARTMENT_OTHER): Admitting: Oncology

## 2024-04-20 VITALS — BP 158/56 | HR 64 | Temp 97.2°F | Resp 18 | Wt 171.7 lb

## 2024-04-20 DIAGNOSIS — Z720 Tobacco use: Secondary | ICD-10-CM

## 2024-04-20 DIAGNOSIS — D61818 Other pancytopenia: Secondary | ICD-10-CM | POA: Diagnosis not present

## 2024-04-20 DIAGNOSIS — C220 Liver cell carcinoma: Secondary | ICD-10-CM

## 2024-04-20 DIAGNOSIS — D508 Other iron deficiency anemias: Secondary | ICD-10-CM

## 2024-04-20 DIAGNOSIS — R109 Unspecified abdominal pain: Secondary | ICD-10-CM

## 2024-04-20 LAB — COMPREHENSIVE METABOLIC PANEL WITH GFR
ALT: 16 U/L (ref 0–44)
AST: 34 U/L (ref 15–41)
Albumin: 2.8 g/dL — ABNORMAL LOW (ref 3.5–5.0)
Alkaline Phosphatase: 169 U/L — ABNORMAL HIGH (ref 38–126)
Anion gap: 7 (ref 5–15)
BUN: 15 mg/dL (ref 8–23)
CO2: 22 mmol/L (ref 22–32)
Calcium: 9 mg/dL (ref 8.9–10.3)
Chloride: 108 mmol/L (ref 98–111)
Creatinine, Ser: 1.27 mg/dL — ABNORMAL HIGH (ref 0.44–1.00)
GFR, Estimated: 47 mL/min — ABNORMAL LOW (ref >60)
Glucose, Bld: 138 mg/dL — ABNORMAL HIGH (ref 70–99)
Potassium: 4.3 mmol/L (ref 3.5–5.1)
Sodium: 137 mmol/L (ref 135–145)
Total Bilirubin: 0.8 mg/dL (ref 0.0–1.2)
Total Protein: 6.6 g/dL (ref 6.5–8.1)

## 2024-04-20 LAB — CBC WITH DIFFERENTIAL/PLATELET
Abs Immature Granulocytes: 0.01 10*3/uL (ref 0.00–0.07)
Basophils Absolute: 0 10*3/uL (ref 0.0–0.1)
Basophils Relative: 1 %
Eosinophils Absolute: 0.2 10*3/uL (ref 0.0–0.5)
Eosinophils Relative: 6 %
HCT: 27.4 % — ABNORMAL LOW (ref 36.0–46.0)
Hemoglobin: 9.1 g/dL — ABNORMAL LOW (ref 12.0–15.0)
Immature Granulocytes: 0 %
Lymphocytes Relative: 32 %
Lymphs Abs: 0.9 10*3/uL (ref 0.7–4.0)
MCH: 34.5 pg — ABNORMAL HIGH (ref 26.0–34.0)
MCHC: 33.2 g/dL (ref 30.0–36.0)
MCV: 103.8 fL — ABNORMAL HIGH (ref 80.0–100.0)
Monocytes Absolute: 0.4 10*3/uL (ref 0.1–1.0)
Monocytes Relative: 15 %
Neutro Abs: 1.4 10*3/uL — ABNORMAL LOW (ref 1.7–7.7)
Neutrophils Relative %: 46 %
Platelets: 74 10*3/uL — ABNORMAL LOW (ref 150–400)
RBC: 2.64 MIL/uL — ABNORMAL LOW (ref 3.87–5.11)
RDW: 13 % (ref 11.5–15.5)
WBC: 2.9 10*3/uL — ABNORMAL LOW (ref 4.0–10.5)
nRBC: 0 % (ref 0.0–0.2)

## 2024-04-20 LAB — VITAMIN B12: Vitamin B-12: 563 pg/mL (ref 180–914)

## 2024-04-20 LAB — FOLATE: Folate: 7.5 ng/mL (ref >5.9)

## 2024-04-20 LAB — IRON AND TIBC
Iron: 117 ug/dL (ref 28–170)
Saturation Ratios: 35 % — ABNORMAL HIGH (ref 10.4–31.8)
TIBC: 336 ug/dL (ref 250–450)
UIBC: 219 ug/dL

## 2024-04-20 LAB — FERRITIN: Ferritin: 315 ng/mL — ABNORMAL HIGH (ref 11–307)

## 2024-04-20 NOTE — Assessment & Plan Note (Signed)
 Patient has abdominal pain postprocedure  Currently on oxycodone  5 mg every 6 as needed  - Increase oxycodone  to 10 mg every 6 hours as needed - Will reach out to Dr. Marlena Sima to consider closer follow-up as the patient still complains of pain for almost a month.

## 2024-04-20 NOTE — Assessment & Plan Note (Addendum)
 Patient with a history of multifocal hepatocellular carcinoma locally. S/p ablation on 03/23/2024 Patient also has a history of hepatitis C and cirrhosis  Surveillance as per NCCN guidelines:  - Imaging every 3 to 6 months for 2 years, then every 6 months - AFP every 3 to 6 months for 2 years, then every 6 months  - Will order MRI of abdomen with and without contrast for 3 weeks from now -CEA prior to procedure was normal, CEA today is pending. - Patient reports abdominal pain since the procedure that has not resolved.  Will reach out to Dr.Hassell with interventional radiology for follow-up -If patient has adequate response from ablation, we will continue to monitor per NCCN guidelines.  Return to clinic after scans to review results and further management

## 2024-04-20 NOTE — Assessment & Plan Note (Signed)
 Half a pack per day for many years. Discussed the exacerbating effect of smoking on any potential cancer diagnosis.  -Advised patient to consider quitting smoking.

## 2024-04-20 NOTE — Patient Instructions (Signed)
 VISIT SUMMARY:  Today, we discussed your severe pain following your recent liver cancer procedure, as well as your swelling, fluid retention, and anemia related to liver cirrhosis. We reviewed your current medications and made adjustments to better manage your symptoms.  YOUR PLAN:  -LIVER CANCER UNDER TREATMENT: Your liver cancer is currently being treated, and your liver markers are normal. We will repeat an MRI in three weeks to see how well the treatment is working. If the treatment is effective, we will continue to monitor with scans every three months for the first two years, and then annually.  -LIVER CIRRHOSIS: Liver cirrhosis is a condition where the liver is scarred and permanently damaged. This is contributing to your anemia and fluid retention. Your white blood cell count and platelets are low due to this condition.  -ANEMIA: Anemia means you have a lower than normal number of red blood cells, which can make you feel tired and short of breath. This is likely related to your liver disease and possibly iron deficiency. We will do additional blood work to check for iron deficiency and provide iron supplements if needed.  -SWELLING/FLUID RETENTION: The swelling and fluid retention in your face, abdomen, and legs are likely due to your liver cirrhosis. This can cause discomfort and weight gain.  -PAIN MANAGEMENT POST-PROCEDURE: You are experiencing severe pain after your liver cancer procedure, and your current medication is not providing enough relief. We will increase your oxycodone  dose to 10 mg every six hours. Please contact your surgeon or interventional radiologist to assess your pain and see if further treatment is needed. If you experience constipation from the medication, you can take Miralax . Contact our clinic for prescription refills if needed.  INSTRUCTIONS:  Please follow up with a repeat MRI in three weeks to assess your liver cancer treatment response. Additionally, contact  your surgeon or interventional radiologist to evaluate your pain management. We will also conduct additional blood work to check for iron deficiency and adjust your treatment as needed.

## 2024-04-20 NOTE — Assessment & Plan Note (Signed)
 Likely secondary to liver cirrhosis Will obtain nutritional studies today  - Will replace as needed per nutrition studies results -Continue to monitor for now

## 2024-04-20 NOTE — Progress Notes (Signed)
 Patient Care Team: Veda Gerald, MD as PCP - General (Internal Medicine) Amanda Jungling Joyceann No, MD as PCP - Cardiology (Cardiology) Suzette Espy, MD (Gastroenterology) Case, Edmund Gouge, MD as Attending Physician (Orthopedic Surgery) Eduardo Grade, MD as Medical Oncologist (Medical Oncology) Gerhard Knuckles, RN as Oncology Nurse Navigator (Medical Oncology)  Clinic Day:  04/20/2024  Referring physician: Veda Gerald, MD   CHIEF COMPLAINT:  CC: Hepatocellular carcinoma   ASSESSMENT & PLAN:   Assessment & Plan: Gabriella Leon  is a 68 y.o. female with hepatocellular carcinoma  Hepatocellular carcinoma (HCC) Patient with a history of multifocal hepatocellular carcinoma locally. S/p ablation on 03/23/2024 Patient also has a history of hepatitis C and cirrhosis  Surveillance as per NCCN guidelines:  - Imaging every 3 to 6 months for 2 years, then every 6 months - AFP every 3 to 6 months for 2 years, then every 6 months  - Will order MRI of abdomen with and without contrast for 3 weeks from now -CEA prior to procedure was normal, CEA today is pending. - Patient reports abdominal pain since the procedure that has not resolved.  Will reach out to Dr.Hassell with interventional radiology for follow-up -If patient has adequate response from ablation, we will continue to monitor per NCCN guidelines.  Return to clinic after scans to review results and further management  Tobacco use Half a pack per day for many years. Discussed the exacerbating effect of smoking on any potential cancer diagnosis.  -Advised patient to consider quitting smoking.   Abdominal pain Patient has abdominal pain postprocedure  Currently on oxycodone  5 mg every 6 as needed  - Increase oxycodone  to 10 mg every 6 hours as needed - Will reach out to Dr. Marlena Sima to consider closer follow-up as the patient still complains of pain for almost a month.  Other pancytopenia Likely secondary to liver  cirrhosis Will obtain nutritional studies today  - Will replace as needed per nutrition studies results -Continue to monitor for now    The patient understands the plans discussed today and is in agreement with them.  She knows to contact our office if she develops concerns prior to her next appointment.  I provided 30 minutes of face-to-face time during this encounter and > 50% was spent counseling as documented under my assessment and plan.    Eduardo Grade, MD  [facility] [place] - A DEPT OF [facility] [address] [place] [address] Dept: [phone] Dept Fax: [phone]   Orders Placed This Encounter  Procedures   MR Abdomen W Wo Contrast    Standing Status:   Future    Expected Date:   05/11/2024    Expiration Date:   04/20/2025    If indicated for the ordered procedure, I authorize the administration of contrast media per Radiology protocol:   Yes    What is the patient's sedation requirement?:   No Sedation    Does the patient have a pacemaker or implanted devices?:   No    Preferred imaging location?:   [facility] (table limit - 550lbs)     ONCOLOGY HISTORY:   Oncology History  Liver lesion  10/19/2023 Imaging   Ultrasound abdomen: Liver: Diffusely echogenic with irregular, nodular contours.  The previously demonstrated 3.2 cm left lobe liver mass currently measures 2.2-1.8 and 1.6 cm.  Interval 1.3 and 1.1 into 0.8 cm similar-appearing mass more laterally in the lateral segment  of the left lobe of the liver.  Interval 1.6 and 1.5 into 1.3 cm oval, heterogeneous mass more anteriorly in the liver on the right.  Portal vein is patent on color Doppler imaging with normal direction of blood flow to the liver   11/11/2023 Initial Diagnosis   Liver lesion   Hepatocellular carcinoma (HCC)  12/04/2023 Cancer Staging   Staging form: Liver, AJCC 8th Edition - Clinical stage from 12/04/2023: cT1,  cN0, cM0 - Signed by Eduardo Grade, MD on 04/20/2024   12/04/2023 Imaging   MRI Abdomen:  1. Arterially hyperenhancing subcapsular lesion of the anterior right lobe of the liver, hepatic segment VIII, measuring 1.6 x 1.3 cm, with evidence of washout and capsular enhancement, consistent with hepatocellular carcinoma. LI-RADS category 5. 2. Additional arterially hyperenhancing lesion of posterior left lobe of the liver, hepatic segment II/III measuring 1.9 x 1.7 cm, also with evidence of washout and capsular enhancement, consistent with hepatocellular carcinoma. LI-RADS category 5. 3. Smaller arterially enhancing lesion inferiorly in hepatic segment III, without evidence of washout or capsular enhancement, measuring 0.8 cm, intermediate suspicion for an additional small hepatocellular carcinoma. LI-RADS category 3. 4. Cirrhosis and portal hypertension, including small gastroesophageal varices.   01/15/2024 Imaging   CT CAP:  IMPRESSION: 1. Cirrhosis and hepatic steatosis. Similar appearance of enhancing lesions in the liver, consistent with multifocal hepatocellular carcinoma and better characterized by recent prior MR. 2. No evidence of lymphadenopathy or metastatic disease in the chest, abdomen, or pelvis. 3. Diverticulosis of the terminal ileum, with adjacent fat stranding and wall thickening, consistent with small bowel diverticulitis. 4. Diffuse urinary bladder wall thickening, consistent with nonspecific infectious or inflammatory cystitis.   03/23/2024 Initial Diagnosis   Hepatocellular carcinoma (HCC)   03/23/2024 Procedure   Arterial ablation of 2 liver lesions       Current Treatment: S/p arterial ablation  INTERVAL HISTORY:  Gabriella Leon is here today for follow up.  Patient had ablation done to liver lesions at the beginning of the month.  She continues to report abdominal pain that did not get better since the procedure.  She is taking oxycodone  5 mg every 6 as needed.   She also reports significant swelling, mostly in her legs.  She also reports some weight gain.  She denies fever, chills, shortness of breath, loss of appetite.  I have reviewed the past medical history, past surgical history, social history and family history with the patient and they are unchanged from previous note.  ALLERGIES:  has no known allergies.  MEDICATIONS:  Current Outpatient Medications  Medication Sig Dispense Refill   BREZTRI AEROSPHERE 160-9-4.8 MCG/ACT AERO Inhale 2 puffs into the lungs in the morning and at bedtime.     hydrOXYzine  (ATARAX ) 10 MG tablet Take 10-20 mg by mouth at bedtime as needed for itching.     metFORMIN  (GLUCOPHAGE ) 500 MG tablet Take 500 mg by mouth daily with breakfast.     methocarbamol  (ROBAXIN ) 500 MG tablet Take 1 tablet (500 mg total) by mouth every 6 (six) hours as needed for muscle spasms. 40 tablet 0   mirtazapine  (REMERON ) 15 MG tablet Take 15 mg by mouth at bedtime.     nadolol  (CORGARD ) 40 MG tablet Take 40 mg by mouth daily at 12 noon.     naltrexone  (DEPADE) 50 MG tablet Take 50 mg by mouth daily.     ondansetron  (ZOFRAN ) 4 MG tablet Take 1 tablet (4 mg total) by mouth every 8 (eight) hours as needed for  nausea or vomiting. 20 tablet 0   oxyCODONE  (OXY IR/ROXICODONE ) 5 MG immediate release tablet Take 1-2 tablets (5-10 mg total) by mouth every 6 (six) hours as needed for severe pain (pain score 7-10) (pain 7-10). 60 tablet 0   pantoprazole  (PROTONIX ) 40 MG tablet Take 40 mg by mouth daily before breakfast.     polyethylene glycol (MIRALAX  / GLYCOLAX ) 17 g packet Take 17 g by mouth daily as needed for mild constipation. 14 each 0   solifenacin (VESICARE) 5 MG tablet Take 5 mg by mouth in the morning.     spironolactone  (ALDACTONE ) 25 MG tablet TAKE 1 TABLET BY MOUTH DAILY (Patient taking differently: Take 25 mg by mouth in the morning and at bedtime.) 90 tablet 1   thiamine  (VITAMIN B-1) 100 MG tablet Take 100 mg by mouth daily.     TYLENOL   8 HOUR 650 MG CR tablet Take 650-1,300 mg by mouth every 8 (eight) hours as needed for pain.     Vitamin D , Ergocalciferol , (DRISDOL ) 1.25 MG (50000 UNIT) CAPS capsule TAKE ONE CAPSULE BY MOUTH ONCE a WEEK (Patient taking differently: Take 50,000 Units by mouth every Wednesday.) 12 capsule 2   No current facility-administered medications for this visit.    REVIEW OF SYSTEMS:   Constitutional: Denies fevers, chills or abnormal weight loss Eyes: Denies blurriness of vision Ears, nose, mouth, throat, and face: Denies mucositis or sore throat Respiratory: Denies cough, dyspnea or wheezes Cardiovascular: Denies palpitation, chest discomfort or lower extremity swelling Gastrointestinal:  Denies nausea, heartburn or change in bowel habits Skin: Denies abnormal skin rashes Lymphatics: Denies new lymphadenopathy or easy bruising Neurological:Denies numbness, tingling or new weaknesses Behavioral/Psych: Mood is stable, no new changes  All other systems were reviewed with the patient and are negative.   VITALS:  Blood pressure (!) 158/56, pulse 64, temperature (!) 97.2 F (36.2 C), temperature source Tympanic, resp. rate 18, weight 171 lb 11.8 oz (77.9 kg), SpO2 98%.  Wt Readings from Last 3 Encounters:  04/20/24 171 lb 11.8 oz (77.9 kg)  03/23/24 166 lb 0.1 oz (75.3 kg)  03/16/24 166 lb (75.3 kg)    Body mass index is 29.48 kg/m.  Performance status (ECOG): 1 - Symptomatic but completely ambulatory  PHYSICAL EXAM:   GENERAL:alert, no distress and comfortable SKIN: skin color, texture, turgor are normal, no rashes or significant lesions LUNGS: clear to auscultation and percussion with normal breathing effort HEART: regular rate & rhythm and no murmurs and no lower extremity edema ABDOMEN:abdomen soft, non-tender and normal bowel sounds Musculoskeletal:no cyanosis of digits and no clubbing  NEURO: alert & oriented x 3 with fluent speech  LABORATORY DATA:  I have reviewed the data as  listed    Component Value Date/Time   NA 137 04/20/2024 0800   NA 139 04/14/2016 1326   K 4.3 04/20/2024 0800   K 4.1 04/14/2016 1326   CL 108 04/20/2024 0800   CO2 22 04/20/2024 0800   CO2 27 04/14/2016 1326   GLUCOSE 138 (H) 04/20/2024 0800   GLUCOSE 135 04/14/2016 1326   BUN 15 04/20/2024 0800   BUN 13.5 04/14/2016 1326   CREATININE 1.27 (H) 04/20/2024 0800   CREATININE 1.10 (H) 06/15/2020 1646   CREATININE 1.2 (H) 04/14/2016 1326   CALCIUM 9.0 04/20/2024 0800   CALCIUM 9.4 04/14/2016 1326   PROT 6.6 04/20/2024 0800   PROT 7.6 04/14/2016 1326   ALBUMIN  2.8 (L) 04/20/2024 0800   ALBUMIN  3.6 04/14/2016 1326   AST  34 04/20/2024 0800   AST 50 (H) 04/14/2016 1326   ALT 16 04/20/2024 0800   ALT 24 04/14/2016 1326   ALKPHOS 169 (H) 04/20/2024 0800   ALKPHOS 138 04/14/2016 1326   BILITOT 0.8 04/20/2024 0800   BILITOT 1.97 (H) 04/14/2016 1326   GFRNONAA 47 (L) 04/20/2024 0800   GFRNONAA 54 (L) 06/15/2020 1646   GFRAA 56 (L) 07/26/2020 1430   GFRAA 63 06/15/2020 1646    Lab Results  Component Value Date   WBC 2.9 (L) 04/20/2024   NEUTROABS 1.4 (L) 04/20/2024   HGB 9.1 (L) 04/20/2024   HCT 27.4 (L) 04/20/2024   MCV 103.8 (H) 04/20/2024   PLT 74 (L) 04/20/2024      Chemistry      Component Value Date/Time   NA 137 04/20/2024 0800   NA 139 04/14/2016 1326   K 4.3 04/20/2024 0800   K 4.1 04/14/2016 1326   CL 108 04/20/2024 0800   CO2 22 04/20/2024 0800   CO2 27 04/14/2016 1326   BUN 15 04/20/2024 0800   BUN 13.5 04/14/2016 1326   CREATININE 1.27 (H) 04/20/2024 0800   CREATININE 1.10 (H) 06/15/2020 1646   CREATININE 1.2 (H) 04/14/2016 1326      Component Value Date/Time   CALCIUM 9.0 04/20/2024 0800   CALCIUM 9.4 04/14/2016 1326   ALKPHOS 169 (H) 04/20/2024 0800   ALKPHOS 138 04/14/2016 1326   AST 34 04/20/2024 0800   AST 50 (H) 04/14/2016 1326   ALT 16 04/20/2024 0800   ALT 24 04/14/2016 1326   BILITOT 0.8 04/20/2024 0800   BILITOT 1.97 (H) 04/14/2016  1326       RADIOGRAPHIC STUDIES: I have personally reviewed the radiological images as listed and agreed with the findings in the report. CT GUIDE TISSUE ABLATION Result Date: 03/23/2024 INDICATION: Cirrhosis. Arterially hyperenhancing subcapsular lesion of the anterior right lobe of the liver, hepatic segment VIII, measuring 1.6 x 1.3 cm, with evidence of washout and capsular enhancement, consistent with hepatocellular carcinoma. LI-RADS category 5. Additional arterially hyperenhancing lesion of posterior left lobe of the liver, hepatic segment II/III measuring 1.9 x 1.7 cm, also with evidence of washout and capsular enhancement, consistent with hepatocellular carcinoma. LI-RADS category 5. EXAM: CT-GUIDED PERCUTANEOUS RADIOFREQUENCY (MICROWAVE ) ABLATION OF RIGHT LIVER LESION CT-GUIDED PERCUTANEOUS RADIOFREQUENCY (MICROWAVE ) ABLATION OF LEFT LIVER LESION ANESTHESIA/SEDATION: General MEDICATIONS: mefoxin  2 g. The antibiotic was administered in an appropriate time interval prior to needle puncture of the skin. CONTRAST:  140mL OMNIPAQUE  IOHEXOL  300 MG/ML  SOLN PROCEDURE: The procedure, risks, benefits, and alternatives were explained to the patient. Questions regarding the procedure were encouraged and answered. The patient understands and consents to the procedure. The patient was placed under general anesthesia. Initial unenhanced and subsequent contrast-enhanced CT was performed in a supine position to localize the segment 2/3 and segment 8 lesions. The procedure was planned. The patient was prepped with chlorhexidine  in a sterile fashion, and a sterile drape was applied covering the operative field. A sterile gown and sterile gloves were used for the procedure. Ultrasound utilized to localize the segment 2/3 posterior lesion, and a 25 gauge hypodermic needle was placed in the overlying skin. Ultrasound utilized to localize the segment 8 lesion, and 25 each hypodermic needle placed in the overlying skin.  CT confirmed correlation with lesions visible on ultrasound. Once the appropriate trajectories established, a PRECISION PR XT 20CM 15G percutaneous microwave ablation probe was advanced into the hypoechoic posterior left hepatic lesion under real-time  ultrasound guidance. CT images were generated during the procedure to establish appropriate depth of the ablation probe and to insure adequate treatment coverage. Subsequently, a PRECISION PR XT 15CM 15G percutaneous microwave ablation probe was advanced into the hypoechoic lateral right hepatic lesion under real-time ultrasound guidance. CT images were generated during the procedure to establish appropriate depth of the ablation probe and to insure adequate treatment coverage. Under ultrasound guidance, attempts were made to advance a 17 gauge trocar needle to the margin of the right lesion to allow confirmatory core biopsy, but no adequately safe approach to the relatively small lesion could be obtained in the presence of the ablation catheter. Similarly, core biopsy of the relatively small left lesion was deferred due to risk to the extant treatment catheter. Under ultrasound guidance, a 5 French 7 cm multi sidehole Yueh sheath needle was advanced to the anterolateral margin of the right hepatic lobe. Saline injected easily. 20 mL of dilute contrast was administered to confirm Adequate perihepatic spread. There is no significant accumulation of fluid lateral to the liver this level for hydro dissection, the fluid seen to flow posterior to the right lobe, therefore continuous saline infusion was initiated through the Yueh catheter to shield the body wall lateral to the treatment zone. Once appropriate positioning was confirmed, a 10-minute ablation was performed concurrently through both probes. The continuous protective saline infusion was discontinued. Tract ablation was performed as the left microwave ablation probe was removed. The right probe was removed  directly. Hemostasis was achieved with manual compression. A limited postprocedural scan was performed. A dressing was placed. COMPLICATIONS: None immediate. FINDINGS: The left segment 2/3 lesion was localized under CT and ultrasound. The right segment 8 lesion was localized under CT and ultrasound. Under ultrasound guidance, percutaneous microwave ablation probes were positioned, confirmed on CT. Percutaneous ultrasound-guided biopsy was deferred due to lack of safe approach in the presence of the probes. Protective saline infusion was initiated lateral to the right lobe due to body wall proximity to the ablation zone. Concurrent MW ablation of right and left hepatic lesions was performed. Post ablation imaging demonstrates an adequate ablation zone and was negative for obvious complication, specifically, no pneumothorax or significant hemorrhage about the ablation site. A component of the protective saline infusion layers in the right pleural space. IMPRESSION: 1. Successful CT- and ultrasound-guided percutaneous thermal ablation of left segment 2/3 hepatocellular carcinoma. 2. Successful CT- and ultrasound-guided percutaneous thermal ablation of right segment 8 hepatocellular carcinoma. The patient will be observed overnight. Initial follow-up will be performed in approximately 4 weeks. Electronically Signed   By: Nicoletta Barrier M.D.   On: 03/23/2024 18:46   DG Chest 1 View Result Date: 03/23/2024 CLINICAL DATA:  Preoperative respiratory evaluation. EXAM: CHEST  1 VIEW COMPARISON:  07/26/2020 FINDINGS: The lungs are clear without focal pneumonia, edema, pneumothorax or pleural effusion. The cardiopericardial silhouette is within normal limits for size. No acute bony abnormality. IMPRESSION: No active disease. Electronically Signed   By: Donnal Fusi M.D.   On: 03/23/2024 11:38

## 2024-04-21 ENCOUNTER — Other Ambulatory Visit: Payer: Self-pay

## 2024-04-21 LAB — AFP TUMOR MARKER: AFP, Serum, Tumor Marker: 5.6 ng/mL (ref 0.0–9.2)

## 2024-04-22 ENCOUNTER — Encounter: Payer: Self-pay | Admitting: Hematology

## 2024-04-22 ENCOUNTER — Other Ambulatory Visit

## 2024-04-22 MED ORDER — OXYCODONE HCL 5 MG PO TABS: 5.0000 mg | ORAL_TABLET | Freq: Four times a day (QID) | ORAL | 0 refills | Status: DC | PRN

## 2024-04-25 ENCOUNTER — Other Ambulatory Visit: Payer: Self-pay | Admitting: *Deleted

## 2024-04-25 MED ORDER — OXYCODONE HCL 10 MG PO TABS: 5.0000 mg | ORAL_TABLET | Freq: Four times a day (QID) | ORAL | 0 refills | Status: AC | PRN

## 2024-04-28 ENCOUNTER — Inpatient Hospital Stay: Admission: RE | Admit: 2024-04-28 | Source: Ambulatory Visit

## 2024-05-05 DIAGNOSIS — I10 Essential (primary) hypertension: Secondary | ICD-10-CM | POA: Diagnosis not present

## 2024-05-05 DIAGNOSIS — N182 Chronic kidney disease, stage 2 (mild): Secondary | ICD-10-CM | POA: Diagnosis not present

## 2024-05-11 ENCOUNTER — Ambulatory Visit (HOSPITAL_COMMUNITY): Admission: RE | Admit: 2024-05-11 | Source: Ambulatory Visit

## 2024-05-18 ENCOUNTER — Inpatient Hospital Stay: Admitting: Oncology

## 2024-05-20 ENCOUNTER — Ambulatory Visit (HOSPITAL_COMMUNITY)
Admission: RE | Admit: 2024-05-20 | Discharge: 2024-05-20 | Disposition: A | Discharge status: Home / Self Care | Source: Ambulatory Visit | Attending: Oncology | Admitting: Oncology

## 2024-05-20 DIAGNOSIS — C220 Liver cell carcinoma: Secondary | ICD-10-CM | POA: Diagnosis not present

## 2024-05-20 DIAGNOSIS — K766 Portal hypertension: Secondary | ICD-10-CM | POA: Diagnosis not present

## 2024-05-20 DIAGNOSIS — K7469 Other cirrhosis of liver: Secondary | ICD-10-CM | POA: Diagnosis not present

## 2024-05-20 DIAGNOSIS — I700 Atherosclerosis of aorta: Secondary | ICD-10-CM | POA: Diagnosis not present

## 2024-05-20 MED ORDER — GADOBUTROL 1 MMOL/ML IV SOLN
7.5000 mL | Freq: Once | INTRAVENOUS | Status: AC | PRN
  Administered 2024-05-20: 7.5 mL via INTRAVENOUS

## 2024-05-27 ENCOUNTER — Inpatient Hospital Stay: Attending: Oncology | Admitting: Oncology

## 2024-05-27 VITALS — BP 138/66 | HR 54 | Temp 97.9°F | Resp 18 | Wt 167.5 lb

## 2024-05-27 DIAGNOSIS — Z720 Tobacco use: Secondary | ICD-10-CM | POA: Insufficient documentation

## 2024-05-27 DIAGNOSIS — F1090 Alcohol use, unspecified, uncomplicated: Secondary | ICD-10-CM | POA: Insufficient documentation

## 2024-05-27 DIAGNOSIS — C220 Liver cell carcinoma: Secondary | ICD-10-CM | POA: Insufficient documentation

## 2024-05-27 DIAGNOSIS — D61818 Other pancytopenia: Secondary | ICD-10-CM | POA: Insufficient documentation

## 2024-05-27 DIAGNOSIS — R109 Unspecified abdominal pain: Secondary | ICD-10-CM | POA: Insufficient documentation

## 2024-05-27 NOTE — Progress Notes (Signed)
 Patient Care Team: Veda Gerald, MD as PCP - General (Internal Medicine) Amanda Jungling Joyceann No, MD as PCP - Cardiology (Cardiology) Suzette Espy, MD (Gastroenterology) Case, Edmund Gouge, MD as Attending Physician (Orthopedic Surgery) Eduardo Grade, MD as Medical Oncologist (Medical Oncology) Gerhard Knuckles, RN as Oncology Nurse Navigator (Medical Oncology)  Clinic Day:  05/27/2024  Referring physician: Veda Gerald, MD   CHIEF COMPLAINT:  CC: Hepatocellular carcinoma   ASSESSMENT & PLAN:   Assessment & Plan: Gabriella Leon  is a 68 y.o. female with hepatocellular carcinoma  Hepatocellular carcinoma (HCC) Patient with a history of multifocal hepatocellular carcinoma locally. S/p ablation on 03/23/2024 Patient also has a history of hepatitis C and cirrhosis MRI post ablation with significant improvement AFP posttreatment within normal limits   -If patient has adequate response from ablation, we will continue to monitor per NCCN guidelines. Surveillance as per NCCN guidelines:  - Imaging every 3 to 6 months for 2 years, then every 6 months - AFP every 3 to 6 months for 2 years, then every 6 months  Return to clinic in 3 months with MRI abdomen  Abdominal pain Patient has abdominal pain postprocedure  Resolved at this time.  Is not taking oxycodone  anymore  Other pancytopenia Likely secondary to liver cirrhosis Nutritional workup: Normal  -Continue to monitor for now  Tobacco use Half a pack per day for many years. Discussed the exacerbating effect of smoking on any potential cancer diagnosis.  -Advised patient to consider quitting smoking.   Alcohol use History of heavy drinking with recent attempts at sobriety.  -Encourage continued efforts towards sobriety.     The patient understands the plans discussed today and is in agreement with them.  She knows to contact our office if she develops concerns prior to her next appointment.  I provided 30  minutes of face-to-face time during this encounter and > 50% was spent counseling as documented under my assessment and plan.    Eduardo Grade, MD  [facility] [place] - A DEPT OF [facility] [address] [place] [address] Dept: [phone] Dept Fax: [phone]   Orders Placed This Encounter  Procedures   MR Abdomen W Wo Contrast    Standing Status:   Future    Expected Date:   08/27/2024    Expiration Date:   05/27/2025    If indicated for the ordered procedure, I authorize the administration of contrast media per Radiology protocol:   Yes    What is the patient's sedation requirement?:   No Sedation    Does the patient have a pacemaker or implanted devices?:   No    Preferred imaging location?:   [facility] (table limit - 550lbs)   CBC with Differential/Platelet    Standing Status:   Future    Expected Date:   08/22/2024    Expiration Date:   05/27/2025   Comprehensive metabolic panel with GFR    Standing Status:   Future    Expected Date:   08/22/2024    Expiration Date:   05/27/2025   AFP tumor marker    Standing Status:   Future    Expected Date:   08/22/2024    Expiration Date:   05/27/2025   CBC with Differential/Platelet    Standing Status:   Future    Expected Date:   05/27/2024    Expiration Date:   05/27/2025   Comprehensive metabolic panel  with GFR    Standing Status:   Future    Expected Date:   05/27/2024    Expiration Date:   05/27/2025   AFP tumor marker    Standing Status:   Future    Expected Date:   05/27/2024    Expiration Date:   05/27/2025     ONCOLOGY HISTORY:   Oncology History  Liver lesion  10/19/2023 Imaging   Ultrasound abdomen: Liver: Diffusely echogenic with irregular, nodular contours.  The previously demonstrated 3.2 cm left lobe liver mass currently measures 2.2-1.8 and 1.6 cm.  Interval 1.3 and 1.1 into 0.8 cm similar-appearing mass more laterally in the lateral segment of the left  lobe of the liver.  Interval 1.6 and 1.5 into 1.3 cm oval, heterogeneous mass more anteriorly in the liver on the right.  Portal vein is patent on color Doppler imaging with normal direction of blood flow to the liver   11/11/2023 Initial Diagnosis   Liver lesion   Hepatocellular carcinoma (HCC)  12/04/2023 Cancer Staging   Staging form: Liver, AJCC 8th Edition - Clinical stage from 12/04/2023: cT1, cN0, cM0 - Signed by Eduardo Grade, MD on 04/20/2024   12/04/2023 Imaging   MRI Abdomen:  1. Arterially hyperenhancing subcapsular lesion of the anterior right lobe of the liver, hepatic segment VIII, measuring 1.6 x 1.3 cm, with evidence of washout and capsular enhancement, consistent with hepatocellular carcinoma. LI-RADS category 5. 2. Additional arterially hyperenhancing lesion of posterior left lobe of the liver, hepatic segment II/III measuring 1.9 x 1.7 cm, also with evidence of washout and capsular enhancement, consistent with hepatocellular carcinoma. LI-RADS category 5. 3. Smaller arterially enhancing lesion inferiorly in hepatic segment III, without evidence of washout or capsular enhancement, measuring 0.8 cm, intermediate suspicion for an additional small hepatocellular carcinoma. LI-RADS category 3. 4. Cirrhosis and portal hypertension, including small gastroesophageal varices.   01/15/2024 Imaging   CT CAP:  IMPRESSION: 1. Cirrhosis and hepatic steatosis. Similar appearance of enhancing lesions in the liver, consistent with multifocal hepatocellular carcinoma and better characterized by recent prior MR. 2. No evidence of lymphadenopathy or metastatic disease in the chest, abdomen, or pelvis. 3. Diverticulosis of the terminal ileum, with adjacent fat stranding and wall thickening, consistent with small bowel diverticulitis. 4. Diffuse urinary bladder wall thickening, consistent with nonspecific infectious or inflammatory cystitis.   03/23/2024 Initial Diagnosis   Hepatocellular  carcinoma (HCC)   03/23/2024 Procedure   Arterial ablation of 2 liver lesions   05/20/2024 Imaging   MRI abdomen with and without contrast:  IMPRESSION: 1. Interval percutaneous ablation of lesions in anterior hepatic segment VIII and in posterior hepatic segments II/III, without residual internal contrast enhancement. LI-RADS category TR, nonviable. 2. Unchanged arterially hyperenhancing lesion in inferior hepatic segment III measuring 0.8 cm, without evidence of washout or capsular enhancement, remains LI-RADS category 3. 3. Cirrhosis and portal hypertension. Sizable gastroesophageal varices, increased compared to prior examination       Current Treatment: S/p arterial ablation  INTERVAL HISTORY:  Gabriella Leon is here today for follow up. She feels 'pretty good' and notes improvement compared to previous visits. Her appetite is good, and she denies recent weight loss.  Patient had postprocedure pain that had resolved at this time and she is not taking any pain medications.  She reports some dry, itchy, discolored skin on bilateral arms and is requesting dermatology referral.  She has no other complaints today and is doing very well.  She continues to smoke.  I have reviewed  the past medical history, past surgical history, social history and family history with the patient and they are unchanged from previous note.  ALLERGIES:  has no known allergies.  MEDICATIONS:  Current Outpatient Medications  Medication Sig Dispense Refill   BREZTRI AEROSPHERE 160-9-4.8 MCG/ACT AERO Inhale 2 puffs into the lungs in the morning and at bedtime.     hydrOXYzine  (ATARAX ) 10 MG tablet Take 10-20 mg by mouth at bedtime as needed for itching.     metFORMIN  (GLUCOPHAGE ) 500 MG tablet Take 500 mg by mouth daily with breakfast.     methocarbamol  (ROBAXIN ) 500 MG tablet Take 1 tablet (500 mg total) by mouth every 6 (six) hours as needed for muscle spasms. 40 tablet 0   mirtazapine  (REMERON ) 15 MG  tablet Take 15 mg by mouth at bedtime.     nadolol  (CORGARD ) 40 MG tablet Take 40 mg by mouth daily at 12 noon.     naltrexone  (DEPADE) 50 MG tablet Take 50 mg by mouth daily.     ondansetron  (ZOFRAN ) 4 MG tablet Take 1 tablet (4 mg total) by mouth every 8 (eight) hours as needed for nausea or vomiting. 20 tablet 0   pantoprazole  (PROTONIX ) 40 MG tablet Take 40 mg by mouth daily before breakfast.     polyethylene glycol (MIRALAX  / GLYCOLAX ) 17 g packet Take 17 g by mouth daily as needed for mild constipation. 14 each 0   solifenacin (VESICARE) 5 MG tablet Take 5 mg by mouth in the morning.     spironolactone  (ALDACTONE ) 25 MG tablet TAKE 1 TABLET BY MOUTH DAILY (Patient taking differently: Take 25 mg by mouth in the morning and at bedtime.) 90 tablet 1   thiamine  (VITAMIN B-1) 100 MG tablet Take 100 mg by mouth daily.     TYLENOL  8 HOUR 650 MG CR tablet Take 650-1,300 mg by mouth every 8 (eight) hours as needed for pain.     Vitamin D , Ergocalciferol , (DRISDOL ) 1.25 MG (50000 UNIT) CAPS capsule TAKE ONE CAPSULE BY MOUTH ONCE a WEEK (Patient taking differently: Take 50,000 Units by mouth every Wednesday.) 12 capsule 2   No current facility-administered medications for this visit.    REVIEW OF SYSTEMS:   Constitutional: Denies fevers, chills or abnormal weight loss Eyes: Denies blurriness of vision Ears, nose, mouth, throat, and face: Denies mucositis or sore throat Respiratory: Denies cough, dyspnea or wheezes Cardiovascular: Denies palpitation, chest discomfort or lower extremity swelling Gastrointestinal:  Denies nausea, heartburn or change in bowel habits Skin: Denies abnormal skin rashes Lymphatics: Denies new lymphadenopathy or easy bruising Neurological:Denies numbness, tingling or new weaknesses Behavioral/Psych: Mood is stable, no new changes  All other systems were reviewed with the patient and are negative.   VITALS:  Blood pressure 138/66, pulse (!) 54, temperature 97.9 F  (36.6 C), temperature source Oral, resp. rate 18, weight 167 lb 8.8 oz (76 kg), SpO2 100%.  Wt Readings from Last 3 Encounters:  05/27/24 167 lb 8.8 oz (76 kg)  04/20/24 171 lb 11.8 oz (77.9 kg)  03/23/24 166 lb 0.1 oz (75.3 kg)    Body mass index is 28.76 kg/m.  Performance status (ECOG): 1 - Symptomatic but completely ambulatory  PHYSICAL EXAM:   GENERAL:alert, no distress and comfortable SKIN: skin color, texture, turgor are normal, no rashes or significant lesions LUNGS: clear to auscultation and percussion with normal breathing effort HEART: regular rate & rhythm and no murmurs and no lower extremity edema ABDOMEN:abdomen soft, non-tender and normal bowel sounds  Musculoskeletal:no cyanosis of digits and no clubbing  NEURO: alert & oriented x 3 with fluent speech  LABORATORY DATA:  I have reviewed the data as listed    Component Value Date/Time   NA 137 04/20/2024 0800   NA 139 04/14/2016 1326   K 4.3 04/20/2024 0800   K 4.1 04/14/2016 1326   CL 108 04/20/2024 0800   CO2 22 04/20/2024 0800   CO2 27 04/14/2016 1326   GLUCOSE 138 (H) 04/20/2024 0800   GLUCOSE 135 04/14/2016 1326   BUN 15 04/20/2024 0800   BUN 13.5 04/14/2016 1326   CREATININE 1.27 (H) 04/20/2024 0800   CREATININE 1.10 (H) 06/15/2020 1646   CREATININE 1.2 (H) 04/14/2016 1326   CALCIUM 9.0 04/20/2024 0800   CALCIUM 9.4 04/14/2016 1326   PROT 6.6 04/20/2024 0800   PROT 7.6 04/14/2016 1326   ALBUMIN  2.8 (L) 04/20/2024 0800   ALBUMIN  3.6 04/14/2016 1326   AST 34 04/20/2024 0800   AST 50 (H) 04/14/2016 1326   ALT 16 04/20/2024 0800   ALT 24 04/14/2016 1326   ALKPHOS 169 (H) 04/20/2024 0800   ALKPHOS 138 04/14/2016 1326   BILITOT 0.8 04/20/2024 0800   BILITOT 1.97 (H) 04/14/2016 1326   GFRNONAA 47 (L) 04/20/2024 0800   GFRNONAA 54 (L) 06/15/2020 1646   GFRAA 56 (L) 07/26/2020 1430   GFRAA 63 06/15/2020 1646    Lab Results  Component Value Date   WBC 2.9 (L) 04/20/2024   NEUTROABS 1.4 (L)  04/20/2024   HGB 9.1 (L) 04/20/2024   HCT 27.4 (L) 04/20/2024   MCV 103.8 (H) 04/20/2024   PLT 74 (L) 04/20/2024      Chemistry      Component Value Date/Time   NA 137 04/20/2024 0800   NA 139 04/14/2016 1326   K 4.3 04/20/2024 0800   K 4.1 04/14/2016 1326   CL 108 04/20/2024 0800   CO2 22 04/20/2024 0800   CO2 27 04/14/2016 1326   BUN 15 04/20/2024 0800   BUN 13.5 04/14/2016 1326   CREATININE 1.27 (H) 04/20/2024 0800   CREATININE 1.10 (H) 06/15/2020 1646   CREATININE 1.2 (H) 04/14/2016 1326      Component Value Date/Time   CALCIUM 9.0 04/20/2024 0800   CALCIUM 9.4 04/14/2016 1326   ALKPHOS 169 (H) 04/20/2024 0800   ALKPHOS 138 04/14/2016 1326   AST 34 04/20/2024 0800   AST 50 (H) 04/14/2016 1326   ALT 16 04/20/2024 0800   ALT 24 04/14/2016 1326   BILITOT 0.8 04/20/2024 0800   BILITOT 1.97 (H) 04/14/2016 1326      Latest Reference Range & Units 02/03/12 14:09 09/08/12 14:55 01/04/13 08:40 11/22/13 14:03  AFP Tumor Marker 0.0 - 8.0 ng/mL 7.7 8.9 (H) 5.7 5.6  (H): Data is abnormally high  RADIOGRAPHIC STUDIES: I have personally reviewed the radiological images as listed and agreed with the findings in the report.  MR Abdomen W Wo Contrast Result Date: 05/24/2024 CLINICAL DATA:  Hepatocellular carcinoma, assess treatment response, status post percutaneous ablation of lesions in hepatic segment II/III and hepatic segment VIII EXAM: MRI ABDOMEN WITHOUT AND WITH CONTRAST TECHNIQUE: Multiplanar multisequence MR imaging of the abdomen was performed both before and after the administration of intravenous contrast. CONTRAST:  7.5mL GADAVIST  GADOBUTROL  1 MMOL/ML IV SOLN COMPARISON:  MR abdomen, 12/04/2023, CT and ultrasound-guided percutaneous ablation, 03/23/2024 FINDINGS: Lower chest: No acute abnormality. Hepatobiliary: Coarse, nodular cirrhotic morphology of the liver. Interval percutaneous ablation of lesions in anterior hepatic segment  VIII (series 13, image 34) and in posterior  hepatic segments II/III (series 13, image 38), with expected intrinsically T1 hyperintense coagulative change, and without residual internal contrast enhancement (series 21, image 34, 38). Unchanged arterially hyperenhancing lesion in inferior hepatic segment III measuring 0.8 cm, without evidence of washout or capsular enhancement (series 16, image 53). No gallstones, gallbladder wall thickening, or biliary dilatation. Pancreas: Unremarkable. No pancreatic ductal dilatation or surrounding inflammatory changes. Spleen: Normal in size without significant abnormality. Adrenals/Urinary Tract: Adrenal glands are unremarkable. Multiple fluid signal renal cortical cysts, benign, requiring no specific further follow-up or characterization kidneys are otherwise normal, without obvious renal calculi, solid lesion, or hydronephrosis. Stomach/Bowel: Stomach is within normal limits. No evidence of bowel wall thickening, distention, or inflammatory changes. Vascular/Lymphatic: Aortic atherosclerosis. Recanalization of the umbilical vein with large varices. Sizable gastroesophageal varices, increased compared to prior examination (series 29, image 25). No enlarged abdominal lymph nodes. Other: No abdominal wall hernia or abnormality. No ascites. Musculoskeletal: No acute or significant osseous findings. IMPRESSION: 1. Interval percutaneous ablation of lesions in anterior hepatic segment VIII and in posterior hepatic segments II/III, without residual internal contrast enhancement. LI-RADS category TR, nonviable. 2. Unchanged arterially hyperenhancing lesion in inferior hepatic segment III measuring 0.8 cm, without evidence of washout or capsular enhancement, remains LI-RADS category 3. 3. Cirrhosis and portal hypertension. Sizable gastroesophageal varices, increased compared to prior examination. Aortic Atherosclerosis (ICD10-I70.0). Electronically Signed   By: Fredricka Jenny M.D.   On: 05/24/2024 07:31

## 2024-05-27 NOTE — Assessment & Plan Note (Signed)
 Likely secondary to liver cirrhosis Nutritional workup: Normal  -Continue to monitor for now

## 2024-05-27 NOTE — Assessment & Plan Note (Signed)
 History of heavy drinking with recent attempts at sobriety. -Encourage continued efforts towards sobriety.

## 2024-05-27 NOTE — Patient Instructions (Signed)
 VISIT SUMMARY:  Today, you came in for a follow-up visit to monitor your cirrhosis and overall health. You mentioned feeling better compared to previous visits, with a good appetite and stable weight. However, you are experiencing severe itching at night and some pain during certain activities. You continue to smoke, which we discussed during your visit.  YOUR PLAN:  -CIRRHOSIS OF LIVER: Cirrhosis is a condition where the liver is scarred and permanently damaged. We will monitor your condition with regular blood work to check your white blood cell count and repeat MRI scans every three months for the first year, then every six months for the following year.  -PRURITUS: Pruritus means severe itching. Your itching is likely related to your liver condition. We will refer you to a dermatologist for further evaluation and management.  -LIVER CANCER: Your recent MRI shows no recurrence of cancer, which is good news. We will continue to monitor you with MRI scans every three months for the first year, then every six months for the following year.  -TOBACCO USE: Continued smoking increases your health risks, especially with your liver condition. We strongly advise you to quit smoking to improve your overall health.  INSTRUCTIONS:  Please follow up with the recommended blood work and MRI scans as scheduled. Additionally, make an appointment with a dermatologist for your itching. Consider smoking cessation resources to help you quit smoking.

## 2024-05-27 NOTE — Assessment & Plan Note (Signed)
 Patient with a history of multifocal hepatocellular carcinoma locally. S/p ablation on 03/23/2024 Patient also has a history of hepatitis C and cirrhosis MRI post ablation with significant improvement AFP posttreatment within normal limits   -If patient has adequate response from ablation, we will continue to monitor per NCCN guidelines. Surveillance as per NCCN guidelines:  - Imaging every 3 to 6 months for 2 years, then every 6 months - AFP every 3 to 6 months for 2 years, then every 6 months  Return to clinic in 3 months with MRI abdomen

## 2024-05-27 NOTE — Assessment & Plan Note (Signed)
 Patient has abdominal pain postprocedure  Resolved at this time.  Is not taking oxycodone  anymore

## 2024-05-27 NOTE — Assessment & Plan Note (Signed)
 Half a pack per day for many years. Discussed the exacerbating effect of smoking on any potential cancer diagnosis.  -Advised patient to consider quitting smoking.

## 2024-06-08 DIAGNOSIS — I10 Essential (primary) hypertension: Secondary | ICD-10-CM | POA: Diagnosis not present

## 2024-06-08 DIAGNOSIS — N182 Chronic kidney disease, stage 2 (mild): Secondary | ICD-10-CM | POA: Diagnosis not present

## 2024-06-29 DIAGNOSIS — K219 Gastro-esophageal reflux disease without esophagitis: Secondary | ICD-10-CM | POA: Diagnosis not present

## 2024-06-29 DIAGNOSIS — N182 Chronic kidney disease, stage 2 (mild): Secondary | ICD-10-CM | POA: Diagnosis not present

## 2024-06-29 DIAGNOSIS — J0110 Acute frontal sinusitis, unspecified: Secondary | ICD-10-CM | POA: Diagnosis not present

## 2024-06-29 DIAGNOSIS — K7469 Other cirrhosis of liver: Secondary | ICD-10-CM | POA: Diagnosis not present

## 2024-06-29 DIAGNOSIS — Z0000 Encounter for general adult medical examination without abnormal findings: Secondary | ICD-10-CM | POA: Diagnosis not present

## 2024-06-29 DIAGNOSIS — I700 Atherosclerosis of aorta: Secondary | ICD-10-CM | POA: Diagnosis not present

## 2024-06-29 DIAGNOSIS — I10 Essential (primary) hypertension: Secondary | ICD-10-CM | POA: Diagnosis not present

## 2024-06-29 DIAGNOSIS — C228 Malignant neoplasm of liver, primary, unspecified as to type: Secondary | ICD-10-CM | POA: Diagnosis not present

## 2024-06-29 DIAGNOSIS — E1122 Type 2 diabetes mellitus with diabetic chronic kidney disease: Secondary | ICD-10-CM | POA: Diagnosis not present

## 2024-08-10 DIAGNOSIS — N182 Chronic kidney disease, stage 2 (mild): Secondary | ICD-10-CM | POA: Diagnosis not present

## 2024-08-10 DIAGNOSIS — I10 Essential (primary) hypertension: Secondary | ICD-10-CM | POA: Diagnosis not present

## 2024-09-01 ENCOUNTER — Inpatient Hospital Stay: Attending: Oncology

## 2024-09-01 ENCOUNTER — Ambulatory Visit (HOSPITAL_COMMUNITY)
Admission: RE | Admit: 2024-09-01 | Discharge: 2024-09-01 | Disposition: A | Discharge status: Home / Self Care | Source: Ambulatory Visit | Attending: Oncology | Admitting: Oncology

## 2024-09-01 DIAGNOSIS — K766 Portal hypertension: Secondary | ICD-10-CM | POA: Diagnosis not present

## 2024-09-01 DIAGNOSIS — I700 Atherosclerosis of aorta: Secondary | ICD-10-CM | POA: Diagnosis not present

## 2024-09-01 DIAGNOSIS — C220 Liver cell carcinoma: Secondary | ICD-10-CM | POA: Diagnosis not present

## 2024-09-01 DIAGNOSIS — F1090 Alcohol use, unspecified, uncomplicated: Secondary | ICD-10-CM | POA: Insufficient documentation

## 2024-09-01 DIAGNOSIS — K7469 Other cirrhosis of liver: Secondary | ICD-10-CM | POA: Diagnosis not present

## 2024-09-01 LAB — CBC WITH DIFFERENTIAL/PLATELET
Abs Immature Granulocytes: 0.02 K/uL (ref 0.00–0.07)
Basophils Absolute: 0.1 K/uL (ref 0.0–0.1)
Basophils Relative: 1 %
Eosinophils Absolute: 0.2 K/uL (ref 0.0–0.5)
Eosinophils Relative: 6 %
HCT: 31.7 % — ABNORMAL LOW (ref 36.0–46.0)
Hemoglobin: 10.8 g/dL — ABNORMAL LOW (ref 12.0–15.0)
Immature Granulocytes: 1 %
Lymphocytes Relative: 34 %
Lymphs Abs: 1.3 K/uL (ref 0.7–4.0)
MCH: 34.5 pg — ABNORMAL HIGH (ref 26.0–34.0)
MCHC: 34.1 g/dL (ref 30.0–36.0)
MCV: 101.3 fL — ABNORMAL HIGH (ref 80.0–100.0)
Monocytes Absolute: 0.4 K/uL (ref 0.1–1.0)
Monocytes Relative: 11 %
Neutro Abs: 1.8 K/uL (ref 1.7–7.7)
Neutrophils Relative %: 47 %
Platelets: 92 K/uL — ABNORMAL LOW (ref 150–400)
RBC: 3.13 MIL/uL — ABNORMAL LOW (ref 3.87–5.11)
RDW: 14 % (ref 11.5–15.5)
WBC: 3.8 K/uL — ABNORMAL LOW (ref 4.0–10.5)
nRBC: 0 % (ref 0.0–0.2)

## 2024-09-01 LAB — COMPREHENSIVE METABOLIC PANEL WITH GFR
ALT: 17 U/L (ref 0–44)
AST: 31 U/L (ref 15–41)
Albumin: 3 g/dL — ABNORMAL LOW (ref 3.5–5.0)
Alkaline Phosphatase: 124 U/L (ref 38–126)
Anion gap: 8 (ref 5–15)
BUN: 16 mg/dL (ref 8–23)
CO2: 26 mmol/L (ref 22–32)
Calcium: 9 mg/dL (ref 8.9–10.3)
Chloride: 106 mmol/L (ref 98–111)
Creatinine, Ser: 1.39 mg/dL — ABNORMAL HIGH (ref 0.44–1.00)
GFR, Estimated: 42 mL/min — ABNORMAL LOW (ref >60)
Glucose, Bld: 123 mg/dL — ABNORMAL HIGH (ref 70–99)
Potassium: 4.1 mmol/L (ref 3.5–5.1)
Sodium: 140 mmol/L (ref 135–145)
Total Bilirubin: 1.4 mg/dL — ABNORMAL HIGH (ref 0.0–1.2)
Total Protein: 7 g/dL (ref 6.5–8.1)

## 2024-09-01 MED ORDER — GADOBUTROL 1 MMOL/ML IV SOLN
7.0000 mL | Freq: Once | INTRAVENOUS | Status: AC | PRN
  Administered 2024-09-01: 7 mL via INTRAVENOUS

## 2024-09-02 LAB — AFP TUMOR MARKER: AFP, Serum, Tumor Marker: 5.8 ng/mL (ref 0.0–9.2)

## 2024-09-09 ENCOUNTER — Inpatient Hospital Stay: Admitting: Oncology

## 2024-09-30 DIAGNOSIS — I10 Essential (primary) hypertension: Secondary | ICD-10-CM | POA: Diagnosis not present

## 2024-09-30 DIAGNOSIS — N182 Chronic kidney disease, stage 2 (mild): Secondary | ICD-10-CM | POA: Diagnosis not present

## 2024-10-03 ENCOUNTER — Inpatient Hospital Stay: Attending: Oncology | Admitting: Oncology

## 2024-10-03 NOTE — Progress Notes (Incomplete)
 Patient Care Team: Orpha Yancey LABOR, MD as PCP - General (Internal Medicine) Alvan Dorn FALCON, MD as PCP - Cardiology (Cardiology) Shaaron Lamar HERO, MD (Gastroenterology) Case, Elspeth CROME, MD as Attending Physician (Orthopedic Surgery) Davonna Siad, MD as Medical Oncologist (Medical Oncology) Celestia Joesph SQUIBB, RN as Oncology Nurse Navigator (Medical Oncology)  Clinic Day:  10/03/2024  Referring physician: Orpha Yancey LABOR, MD   CHIEF COMPLAINT:  CC: Hepatocellular carcinoma   ASSESSMENT & PLAN:   Assessment & Plan: Gabriella Leon  is a 68 y.o. female with hepatocellular carcinoma  No problem-specific Assessment & Plan notes found for this encounter.  The patient understands the plans discussed today and is in agreement with them.  She knows to contact our office if she develops concerns prior to her next appointment.  I provided *** minutes of face-to-face time during this encounter and > 50% was spent counseling as documented under my assessment and plan.   I,Skylar P Smith,acting as a Neurosurgeon for Siad Davonna, MD.,have documented all relevant documentation on the behalf of Siad Davonna, MD,as directed by  Siad Davonna, MD while in the presence of Siad Davonna, MD.  ***  [facility]ylar P [facility]  [facility] [place] - A DEPT OF [facility] [address] [place] [address] Dept: [phone] Dept Fax: [phone]   No orders of the defined types were placed in this encounter.    ONCOLOGY HISTORY:  10/19/2023: Imaging ultrasound abdomen: Liver: Diffusely echogenic with irregular, modular contours. The previously demonstrated 3.2 cm left lobe liver mass currently measures 2.2-1.8 and 1.6 cm. Interval 1.3 ane 1.1 into 0.8 cm similar-appearing mass more laterally in the lateral segment of the left lobe of the liver. Interval 1.6 and 1.5 into 1.3 cm oval, heterogenous mass more anteriorly in the liver on  the right. Portal vein is patent on color Doppler imaging with normal direction of blood flow to the liver.  12/04/2023: Imaging  MRI Abdomen: Arterially hyperenhancing subcapsular lesion of the anterior right lobe of the liver, hepatic segment VIII, measuring 1.6 x 1.3 cm, with evidence of washout and capsular enhancement, consistent with hepatocellular carcinoma. LI-RADS category 5. Additional arterially hyperenhancing lesion of posterior left lobe of the liver, hepatic segment II/III measuring 1.9 x 1.7 cm, also with evidence of washout and capsular enhancement, consistent with hepatocellular carcinoma. LI-RADS category 5. Smaller arterially enhancing lesion inferiorly in hepatic segment III, without evidence of washout or capsular enhancement, measuring 0.8 cm, intermediate suspicion for an additional small hepatocellular carcinoma. LI-RADS category 3. Cirrhosis and portal hypertension, including small gastroesophageal varices. 01/15/2024: Imaging CT CAP Cirrhosis and hepatic steatosis. Similar appearance of enhancing lesions in the liver, consistent with multifocal hepatocellular carcinoma and better characterized by recent prior MR. No evidence of lymphadenopathy or metastatic disease in the chest, abdomen, or pelvis. Diverticulosis of the terminal ileum, with adjacent fat stranding and wall thickening, consistent with small bowel diverticulitis. Diffuse urinary bladder wall thickening, consistent with nonspecific infectious or inflammatory cystitis. 03/23/2024: Initial Diagnosis  Hepatocellular Carcinoma 03/23/2024: Procedure  Arterial ablation of 2 liver lesions 05/20/2024: Imaging  MRI abdomen with and without contrast: Interval percutaneous ablation of lesions in anterior hepatic segment VIII and in posterior hepatic segments II/III, without residual internal contrast enhancement. LI-RADS category TR, nonviable. Unchanged arterially hyperenhancing lesion in inferior hepatic segment III measuring 0.8  cm, without evidence of washout or capsular enhancement, remains LI-RADS category 3. Cirrhosis and portal hypertension. Sizable gastroesophageal varices, increased  compared to prior examination   Current Treatment: S/p arterial ablation  INTERVAL HISTORY:  Gabriella Leon is here today for follow up.    I have reviewed the past medical history, past surgical history, social history and family history with the patient and they are unchanged from previous note.  ALLERGIES:  has no known allergies.  MEDICATIONS:  Current Outpatient Medications  Medication Sig Dispense Refill   BREZTRI AEROSPHERE 160-9-4.8 MCG/ACT AERO Inhale 2 puffs into the lungs in the morning and at bedtime.     hydrOXYzine  (ATARAX ) 10 MG tablet Take 10-20 mg by mouth at bedtime as needed for itching.     metFORMIN  (GLUCOPHAGE ) 500 MG tablet Take 500 mg by mouth daily with breakfast.     methocarbamol  (ROBAXIN ) 500 MG tablet Take 1 tablet (500 mg total) by mouth every 6 (six) hours as needed for muscle spasms. 40 tablet 0   mirtazapine  (REMERON ) 15 MG tablet Take 15 mg by mouth at bedtime.     nadolol  (CORGARD ) 40 MG tablet Take 40 mg by mouth daily at 12 noon.     naltrexone  (DEPADE) 50 MG tablet Take 50 mg by mouth daily.     ondansetron  (ZOFRAN ) 4 MG tablet Take 1 tablet (4 mg total) by mouth every 8 (eight) hours as needed for nausea or vomiting. 20 tablet 0   pantoprazole  (PROTONIX ) 40 MG tablet Take 40 mg by mouth daily before breakfast.     polyethylene glycol (MIRALAX  / GLYCOLAX ) 17 g packet Take 17 g by mouth daily as needed for mild constipation. 14 each 0   solifenacin (VESICARE) 5 MG tablet Take 5 mg by mouth in the morning.     spironolactone  (ALDACTONE ) 25 MG tablet TAKE 1 TABLET BY MOUTH DAILY (Patient taking differently: Take 25 mg by mouth in the morning and at bedtime.) 90 tablet 1   thiamine  (VITAMIN B-1) 100 MG tablet Take 100 mg by mouth daily.     TYLENOL  8 HOUR 650 MG CR tablet Take 650-1,300  mg by mouth every 8 (eight) hours as needed for pain.     Vitamin D , Ergocalciferol , (DRISDOL ) 1.25 MG (50000 UNIT) CAPS capsule TAKE ONE CAPSULE BY MOUTH ONCE a WEEK (Patient taking differently: Take 50,000 Units by mouth every Wednesday.) 12 capsule 2   No current facility-administered medications for this visit.    REVIEW OF SYSTEMS:   Constitutional: Denies fevers, chills or abnormal weight loss Eyes: Denies blurriness of vision Ears, nose, mouth, throat, and face: Denies mucositis or sore throat Respiratory: Denies cough, dyspnea or wheezes Cardiovascular: Denies palpitation, chest discomfort or lower extremity swelling Gastrointestinal:  Denies nausea, heartburn or change in bowel habits Skin: Denies abnormal skin rashes Lymphatics: Denies new lymphadenopathy or easy bruising Neurological:Denies numbness, tingling or new weaknesses Behavioral/Psych: Mood is stable, no new changes  All other systems were reviewed with the patient and are negative.   VITALS:  There were no vitals taken for this visit.  Wt Readings from Last 3 Encounters:  05/27/24 167 lb 8.8 oz (76 kg)  04/20/24 171 lb 11.8 oz (77.9 kg)  03/23/24 166 lb 0.1 oz (75.3 kg)    There is no height or weight on file to calculate BMI.  Performance status (ECOG): 1 - Symptomatic but completely ambulatory  PHYSICAL EXAM:   GENERAL:alert, no distress and comfortable SKIN: skin color, texture, turgor are normal, no rashes or significant lesions LUNGS: clear to auscultation and percussion with normal breathing effort HEART: regular rate & rhythm  and no murmurs and no lower extremity edema ABDOMEN:abdomen soft, non-tender and normal bowel sounds Musculoskeletal:no cyanosis of digits and no clubbing  NEURO: alert & oriented x 3 with fluent speech  LABORATORY DATA:  I have reviewed the data as listed    Component Value Date/Time   NA 140 09/01/2024 0845   NA 139 04/14/2016 1326   K 4.1 09/01/2024 0845   K 4.1  04/14/2016 1326   CL 106 09/01/2024 0845   CO2 26 09/01/2024 0845   CO2 27 04/14/2016 1326   GLUCOSE 123 (H) 09/01/2024 0845   GLUCOSE 135 04/14/2016 1326   BUN 16 09/01/2024 0845   BUN 13.5 04/14/2016 1326   CREATININE 1.39 (H) 09/01/2024 0845   CREATININE 1.10 (H) 06/15/2020 1646   CREATININE 1.2 (H) 04/14/2016 1326   CALCIUM 9.0 09/01/2024 0845   CALCIUM 9.4 04/14/2016 1326   PROT 7.0 09/01/2024 0845   PROT 7.6 04/14/2016 1326   ALBUMIN  3.0 (L) 09/01/2024 0845   ALBUMIN  3.6 04/14/2016 1326   AST 31 09/01/2024 0845   AST 50 (H) 04/14/2016 1326   ALT 17 09/01/2024 0845   ALT 24 04/14/2016 1326   ALKPHOS 124 09/01/2024 0845   ALKPHOS 138 04/14/2016 1326   BILITOT 1.4 (H) 09/01/2024 0845   BILITOT 1.97 (H) 04/14/2016 1326   GFRNONAA 42 (L) 09/01/2024 0845   GFRNONAA 54 (L) 06/15/2020 1646   GFRAA 56 (L) 07/26/2020 1430   GFRAA 63 06/15/2020 1646    Lab Results  Component Value Date   WBC 3.8 (L) 09/01/2024   NEUTROABS 1.8 09/01/2024   HGB 10.8 (L) 09/01/2024   HCT 31.7 (L) 09/01/2024   MCV 101.3 (H) 09/01/2024   PLT 92 (L) 09/01/2024      Chemistry      Component Value Date/Time   NA 140 09/01/2024 0845   NA 139 04/14/2016 1326   K 4.1 09/01/2024 0845   K 4.1 04/14/2016 1326   CL 106 09/01/2024 0845   CO2 26 09/01/2024 0845   CO2 27 04/14/2016 1326   BUN 16 09/01/2024 0845   BUN 13.5 04/14/2016 1326   CREATININE 1.39 (H) 09/01/2024 0845   CREATININE 1.10 (H) 06/15/2020 1646   CREATININE 1.2 (H) 04/14/2016 1326      Component Value Date/Time   CALCIUM 9.0 09/01/2024 0845   CALCIUM 9.4 04/14/2016 1326   ALKPHOS 124 09/01/2024 0845   ALKPHOS 138 04/14/2016 1326   AST 31 09/01/2024 0845   AST 50 (H) 04/14/2016 1326   ALT 17 09/01/2024 0845   ALT 24 04/14/2016 1326   BILITOT 1.4 (H) 09/01/2024 0845   BILITOT 1.97 (H) 04/14/2016 1326      Latest Reference Range & Units 02/03/12 14:09 09/08/12 14:55 01/04/13 08:40 11/22/13 14:03  AFP Tumor Marker 0.0 -  8.0 ng/mL 7.7 8.9 (H) 5.7 5.6  (H): Data is abnormally high  RADIOGRAPHIC STUDIES: I have personally reviewed the radiological images as listed and agreed with the findings in the report.  No results found.

## 2024-10-11 ENCOUNTER — Encounter: Payer: Self-pay | Admitting: Hematology

## 2024-10-25 DIAGNOSIS — I10 Essential (primary) hypertension: Secondary | ICD-10-CM | POA: Diagnosis not present

## 2024-10-25 DIAGNOSIS — E1122 Type 2 diabetes mellitus with diabetic chronic kidney disease: Secondary | ICD-10-CM | POA: Diagnosis not present

## 2024-10-25 DIAGNOSIS — I700 Atherosclerosis of aorta: Secondary | ICD-10-CM | POA: Diagnosis not present

## 2024-10-25 DIAGNOSIS — J0110 Acute frontal sinusitis, unspecified: Secondary | ICD-10-CM | POA: Diagnosis not present

## 2024-10-25 DIAGNOSIS — K7469 Other cirrhosis of liver: Secondary | ICD-10-CM | POA: Diagnosis not present

## 2024-10-25 DIAGNOSIS — K219 Gastro-esophageal reflux disease without esophagitis: Secondary | ICD-10-CM | POA: Diagnosis not present

## 2024-10-25 DIAGNOSIS — C228 Malignant neoplasm of liver, primary, unspecified as to type: Secondary | ICD-10-CM | POA: Diagnosis not present

## 2024-10-25 DIAGNOSIS — N1831 Chronic kidney disease, stage 3a: Secondary | ICD-10-CM | POA: Diagnosis not present

## 2024-10-26 ENCOUNTER — Inpatient Hospital Stay: Attending: Oncology | Admitting: Oncology

## 2024-10-26 VITALS — BP 142/65 | HR 80 | Temp 97.7°F | Resp 18 | Ht 64.0 in | Wt 181.6 lb

## 2024-10-26 DIAGNOSIS — K7460 Unspecified cirrhosis of liver: Secondary | ICD-10-CM | POA: Insufficient documentation

## 2024-10-26 DIAGNOSIS — M545 Low back pain, unspecified: Secondary | ICD-10-CM

## 2024-10-26 DIAGNOSIS — C220 Liver cell carcinoma: Secondary | ICD-10-CM | POA: Diagnosis not present

## 2024-10-26 DIAGNOSIS — Z720 Tobacco use: Secondary | ICD-10-CM | POA: Insufficient documentation

## 2024-10-26 DIAGNOSIS — M549 Dorsalgia, unspecified: Secondary | ICD-10-CM | POA: Insufficient documentation

## 2024-10-26 DIAGNOSIS — D61818 Other pancytopenia: Secondary | ICD-10-CM | POA: Diagnosis not present

## 2024-10-26 DIAGNOSIS — B1920 Unspecified viral hepatitis C without hepatic coma: Secondary | ICD-10-CM | POA: Diagnosis not present

## 2024-10-26 DIAGNOSIS — F1090 Alcohol use, unspecified, uncomplicated: Secondary | ICD-10-CM | POA: Diagnosis not present

## 2024-10-26 DIAGNOSIS — I700 Atherosclerosis of aorta: Secondary | ICD-10-CM | POA: Diagnosis not present

## 2024-10-26 NOTE — Patient Instructions (Signed)
 [facility] at [facility] Discharge Instructions   You were seen and examined today by Dr. Davonna.  She reviewed the results of your lab work which are normal/stable.   We will see you back in 2 months. We will repeat lab work and a MRI prior to this visit.    Return as scheduled.    Thank you for choosing [facility] at [facility] to provide your oncology and hematology care.  To afford each patient quality time with our provider, please arrive at least 15 minutes before your scheduled appointment time.   If you have a lab appointment with the Cancer Center please come in thru the Main Entrance and check in at the main information desk.  You need to re-schedule your appointment should you arrive 10 or more minutes late.  We strive to give you quality time with our providers, and arriving late affects you and other patients whose appointments are after yours.  Also, if you no show three or more times for appointments you may be dismissed from the clinic at the providers discretion.     Again, thank you for choosing [facility].  Our hope is that these requests will decrease the amount of time that you wait before being seen by our physicians.       _____________________________________________________________  Should you have questions after your visit to [facility], please contact our office at [phone] and follow the prompts.  Our office hours are 8:00 a.m. and 4:30 p.m. Monday - Friday.  Please note that voicemails left after 4:00 p.m. may not be returned until the following business day.  We are closed weekends and major holidays.  You do have access to a nurse 24-7, just call the main number to the clinic [phone] and do not press any options, hold on the line and a nurse will answer the phone.    For prescription refill requests, have your pharmacy contact our office and allow 72 hours.    Due to Covid,  you will need to wear a mask upon entering the hospital. If you do not have a mask, a mask will be given to you at the Main Entrance upon arrival. For doctor visits, patients may have 1 support person age 1 or older with them. For treatment visits, patients can not have anyone with them due to social distancing guidelines and our immunocompromised population.

## 2024-10-26 NOTE — Progress Notes (Signed)
 Patient Care Team: Orpha Yancey LABOR, MD as PCP - General (Internal Medicine) Alvan Dorn FALCON, MD as PCP - Cardiology (Cardiology) Shaaron Lamar HERO, MD (Gastroenterology) Case, Elspeth CROME, MD as Attending Physician (Orthopedic Surgery) Davonna Siad, MD as Medical Oncologist (Medical Oncology) Celestia Joesph SQUIBB, RN as Oncology Nurse Navigator (Medical Oncology)  Clinic Day:  10/26/2024  Referring physician: Orpha Yancey LABOR, MD   CHIEF COMPLAINT:  CC: Hepatocellular carcinoma   ASSESSMENT & PLAN:   Assessment & Plan: Gabriella Leon  is a 68 y.o. female with hepatocellular carcinoma  Hepatocellular carcinoma  Patient with a history of multifocal hepatocellular carcinoma locally. S/p ablation on 03/23/2024 Patient also has a history of hepatitis C and cirrhosis MRI post ablation with significant improvement AFP posttreatment within normal limits   -Surveillance as per NCCN guidelines:             - Imaging every 3 to 6 months for 2 years, then every 6 months - AFP every 3 to 6 months for 2 years, then every 6 months - Patient missed several appointments since her last imaging.  Repeat MRI in January - We reviewed the MRI report together.  Patient has unchanged lesions which are consistent with good results. - Last AFP was normal.  Will repeat on return visit  Return to clinic in 2 months with labs and MRI  Tobacco use Half a pack per day for many years. Discussed the exacerbating effect of smoking on any potential cancer diagnosis.   -Advised patient to consider quitting smoking.  Alcohol use Patient reports getting alcohol a few months ago and only having a glass of wine for her birthday.  Pancytopenia Likely secondary to liver cirrhosis  - Continue to monitor for now.  Back pain Likely musculoskeletal - Managing with occasional Tylenol  at this time. - Recommended caution with Tylenol  use.  The patient understands the plans discussed today and is in  agreement with them.  She knows to contact our office if she develops concerns prior to her next appointment.  The total time spent in the appointment was 20 minutes for the encounter  with patient, including review of chart and various tests results, discussions about plan of care and coordination of care plan    Siad Davonna, MD  [facility] [place] - A DEPT OF [facility] [address] [place] [address] Dept: [phone] Dept Fax: [phone]   Orders Placed This Encounter  Procedures   MR Abdomen W Wo Contrast    Standing Status:   Future    Expected Date:   12/27/2024    Expiration Date:   10/26/2025    If indicated for the ordered procedure, I authorize the administration of contrast media per Radiology protocol:   Yes    What is the patient's sedation requirement?:   No Sedation    Does the patient have a pacemaker or implanted devices?:   Yes    Preferred imaging location?:   [facility] (table limit - 500lbs)   CBC with Differential    Standing Status:   Future    Expected Date:   12/27/2024    Expiration Date:   03/27/2025   Comprehensive metabolic panel    Standing Status:   Future    Expected Date:   12/27/2024    Expiration Date:   03/27/2025   AFP tumor marker    Standing Status:   Future    Expected  Date:   12/27/2024    Expiration Date:   03/27/2025     ONCOLOGY HISTORY:   Diagnosis: Hepatocellular carcinoma  -10/19/2023:Ultrasound abdomen: Liver: Diffusely echogenic with irregular, modular contours. The previously demonstrated 3.2 cm left lobe liver mass currently measures 2.2-1.8 and 1.6 cm. Interval 1.3 ane 1.1 into 0.8 cm similar-appearing mass more laterally in the lateral segment of the left lobe of the liver. Interval 1.6 and 1.5 into 1.3 cm oval, heterogenous mass more anteriorly in the liver on the right. Portal vein is patent on color Doppler imaging with normal direction of blood flow to  the liver.  -12/04/2023: MRI Abdomen: Arterially hyperenhancing subcapsular lesion of the anterior right lobe of the liver, hepatic segment VIII, measuring 1.6 x 1.3 cm, with evidence of washout and capsular enhancement, consistent with hepatocellular carcinoma. LI-RADS category 5. Additional arterially hyperenhancing lesion of posterior left lobe of the liver, hepatic segment II/III measuring 1.9 x 1.7 cm, also with evidence of washout and capsular enhancement, consistent with hepatocellular carcinoma. LI-RADS category 5. Smaller arterially enhancing lesion inferiorly in hepatic segment III, without evidence of washout or capsular enhancement, measuring 0.8 cm, intermediate suspicion for an additional small hepatocellular carcinoma. LI-RADS category 3. Cirrhosis and portal hypertension, including small gastroesophageal varices. -01/15/2024: CT CAP Cirrhosis and hepatic steatosis. Similar appearance of enhancing lesions in the liver, consistent with multifocal hepatocellular carcinoma and better characterized by recent prior MR. No evidence of lymphadenopathy or metastatic disease in the chest, abdomen, or pelvis. Diverticulosis of the terminal ileum, with adjacent fat stranding and wall thickening, consistent with small bowel diverticulitis. Diffuse urinary bladder wall thickening, consistent with nonspecific infectious or inflammatory cystitis. -03/23/2024: Arterial ablation of 2 liver lesions -05/20/2024: MRI abdomen with and without contrast: Interval percutaneous ablation of lesions in anterior hepatic segment VIII and in posterior hepatic segments II/III, without residual internal contrast enhancement. LI-RADS category TR, nonviable. Unchanged arterially hyperenhancing lesion in inferior hepatic segment III measuring 0.8 cm, without evidence of washout or capsular enhancement, remains LI-RADS category 3. Cirrhosis and portal hypertension. Sizable gastroesophageal varices, increased compared to prior  examination - 09/01/2024: MRI abdomen with and without contrast: Unchanged, nonenhancing ablation defects of the anterior right lobe of the liver, hepatic segment VIII, and posterior left lobe of the liver, hepatic segment II/III. No evidence of residual or recurrent tumor at these sites. LI-RADS TR, nonviable. Unchanged arterially hyperenhancing lesion of the inferior margin of hepatic segment III, measuring 0.9 cm. No evidence of washout or capsular enhancement. LI-RADS category 3. Cirrhosis and portal hypertension. Moderate gastroesophageal varices.  Current Treatment: S/p arterial ablation  INTERVAL HISTORY:   Gabriella Leon is a 68 year old female presenting for follow-up for her hepatocellular carcinoma.  Patient was not able to attend previous follow-up visits due to transportation issues.  Patient reports some lower back pain that is likely musculoskeletal.  She has no other complaints today.  Denies abdominal pain, nausea, vomiting.  No loss of weight or appetite is reported.   I have reviewed the past medical history, past surgical history, social history and family history with the patient and they are unchanged from previous note.  ALLERGIES:  has no known allergies.  MEDICATIONS:  Current Outpatient Medications  Medication Sig Dispense Refill   BREZTRI AEROSPHERE 160-9-4.8 MCG/ACT AERO Inhale 2 puffs into the lungs in the morning and at bedtime.     gabapentin (NEURONTIN) 100 MG capsule Take 100 mg by mouth 3 (three) times daily.     hydrOXYzine  (ATARAX ) 10 MG tablet  Take 10-20 mg by mouth at bedtime as needed for itching.     metFORMIN  (GLUCOPHAGE ) 500 MG tablet Take 500 mg by mouth daily with breakfast.     methocarbamol  (ROBAXIN ) 500 MG tablet Take 1 tablet (500 mg total) by mouth every 6 (six) hours as needed for muscle spasms. 40 tablet 0   mirtazapine  (REMERON ) 15 MG tablet Take 15 mg by mouth at bedtime.     nadolol  (CORGARD ) 40 MG tablet Take 40 mg by mouth daily  at 12 noon.     naltrexone  (DEPADE) 50 MG tablet Take 50 mg by mouth daily.     ondansetron  (ZOFRAN ) 4 MG tablet Take 1 tablet (4 mg total) by mouth every 8 (eight) hours as needed for nausea or vomiting. 20 tablet 0   pantoprazole  (PROTONIX ) 40 MG tablet Take 40 mg by mouth daily before breakfast.     polyethylene glycol (MIRALAX  / GLYCOLAX ) 17 g packet Take 17 g by mouth daily as needed for mild constipation. 14 each 0   solifenacin (VESICARE) 5 MG tablet Take 5 mg by mouth in the morning.     spironolactone  (ALDACTONE ) 25 MG tablet TAKE 1 TABLET BY MOUTH DAILY (Patient taking differently: Take 25 mg by mouth in the morning and at bedtime.) 90 tablet 1   thiamine  (VITAMIN B-1) 100 MG tablet Take 100 mg by mouth daily.     TYLENOL  8 HOUR 650 MG CR tablet Take 650-1,300 mg by mouth every 8 (eight) hours as needed for pain.     Vitamin D , Ergocalciferol , (DRISDOL ) 1.25 MG (50000 UNIT) CAPS capsule TAKE ONE CAPSULE BY MOUTH ONCE a WEEK (Patient taking differently: Take 50,000 Units by mouth every Wednesday.) 12 capsule 2   No current facility-administered medications for this visit.    REVIEW OF SYSTEMS:   Constitutional: Denies fevers, chills or abnormal weight loss Eyes: Denies blurriness of vision Ears, nose, mouth, throat, and face: Denies mucositis or sore throat Respiratory: Denies cough, dyspnea or wheezes Cardiovascular: Denies palpitation, chest discomfort or lower extremity swelling Gastrointestinal:  Denies nausea, heartburn or change in bowel habits Skin: Denies abnormal skin rashes Lymphatics: Denies new lymphadenopathy or easy bruising Neurological:Denies numbness, tingling or new weaknesses Behavioral/Psych: Mood is stable, no new changes  All other systems were reviewed with the patient and are negative.   VITALS:  Blood pressure (!) 142/65, pulse 80, temperature 97.7 F (36.5 C), temperature source Tympanic, resp. rate 18, height 5' 4 (1.626 m), weight 181 lb 9.6 oz  (82.4 kg), SpO2 98%.  Wt Readings from Last 3 Encounters:  10/26/24 181 lb 9.6 oz (82.4 kg)  05/27/24 167 lb 8.8 oz (76 kg)  04/20/24 171 lb 11.8 oz (77.9 kg)    Body mass index is 31.17 kg/m.  Performance status (ECOG): 1 - Symptomatic but completely ambulatory  PHYSICAL EXAM:   GENERAL:alert, no distress and comfortable SKIN: skin color, texture, turgor are normal, no rashes or significant lesions LUNGS: clear to auscultation and percussion with normal breathing effort HEART: regular rate & rhythm and no murmurs and no lower extremity edema ABDOMEN:abdomen soft, non-tender and normal bowel sounds Musculoskeletal:no cyanosis of digits and no clubbing  NEURO: alert & oriented x 3 with fluent speech  LABORATORY DATA:  I have reviewed the data as listed    Component Value Date/Time   NA 140 09/01/2024 0845   NA 139 04/14/2016 1326   K 4.1 09/01/2024 0845   K 4.1 04/14/2016 1326   CL 106 09/01/2024  0845   CO2 26 09/01/2024 0845   CO2 27 04/14/2016 1326   GLUCOSE 123 (H) 09/01/2024 0845   GLUCOSE 135 04/14/2016 1326   BUN 16 09/01/2024 0845   BUN 13.5 04/14/2016 1326   CREATININE 1.39 (H) 09/01/2024 0845   CREATININE 1.10 (H) 06/15/2020 1646   CREATININE 1.2 (H) 04/14/2016 1326   CALCIUM 9.0 09/01/2024 0845   CALCIUM 9.4 04/14/2016 1326   PROT 7.0 09/01/2024 0845   PROT 7.6 04/14/2016 1326   ALBUMIN  3.0 (L) 09/01/2024 0845   ALBUMIN  3.6 04/14/2016 1326   AST 31 09/01/2024 0845   AST 50 (H) 04/14/2016 1326   ALT 17 09/01/2024 0845   ALT 24 04/14/2016 1326   ALKPHOS 124 09/01/2024 0845   ALKPHOS 138 04/14/2016 1326   BILITOT 1.4 (H) 09/01/2024 0845   BILITOT 1.97 (H) 04/14/2016 1326   GFRNONAA 42 (L) 09/01/2024 0845   GFRNONAA 54 (L) 06/15/2020 1646   GFRAA 56 (L) 07/26/2020 1430   GFRAA 63 06/15/2020 1646    Lab Results  Component Value Date   WBC 3.8 (L) 09/01/2024   NEUTROABS 1.8 09/01/2024   HGB 10.8 (L) 09/01/2024   HCT 31.7 (L) 09/01/2024   MCV 101.3  (H) 09/01/2024   PLT 92 (L) 09/01/2024      Chemistry      Component Value Date/Time   NA 140 09/01/2024 0845   NA 139 04/14/2016 1326   K 4.1 09/01/2024 0845   K 4.1 04/14/2016 1326   CL 106 09/01/2024 0845   CO2 26 09/01/2024 0845   CO2 27 04/14/2016 1326   BUN 16 09/01/2024 0845   BUN 13.5 04/14/2016 1326   CREATININE 1.39 (H) 09/01/2024 0845   CREATININE 1.10 (H) 06/15/2020 1646   CREATININE 1.2 (H) 04/14/2016 1326      Component Value Date/Time   CALCIUM 9.0 09/01/2024 0845   CALCIUM 9.4 04/14/2016 1326   ALKPHOS 124 09/01/2024 0845   ALKPHOS 138 04/14/2016 1326   AST 31 09/01/2024 0845   AST 50 (H) 04/14/2016 1326   ALT 17 09/01/2024 0845   ALT 24 04/14/2016 1326   BILITOT 1.4 (H) 09/01/2024 0845   BILITOT 1.97 (H) 04/14/2016 1326      Latest Reference Range & Units 04/20/24 08:00 09/01/24 08:45  AFP, Serum, Tumor Marker 0.0 - 9.2 ng/mL 5.6 5.8    RADIOGRAPHIC STUDIES: I have personally reviewed the radiological images as listed and agreed with the findings in the report.  MR Abdomen W Wo Contrast CLINICAL DATA:  Hepatocellular carcinoma, assess treatment response, status post percutaneous ablation of lesions in hepatic segment II/III and hepatic segment VIII.  EXAM: MRI ABDOMEN WITHOUT AND WITH CONTRAST  TECHNIQUE: Multiplanar multisequence MR imaging of the abdomen was performed both before and after the administration of intravenous contrast.  CONTRAST:  7mL GADAVIST  GADOBUTROL  1 MMOL/ML IV SOLN  COMPARISON:  05/20/2024  FINDINGS: Lower chest: No acute abnormality.  Hepatobiliary: Coarse, nodular cirrhotic morphology of the liver. Unchanged, nonenhancing ablation defects of the anterior right lobe of the liver, hepatic segment VIII (series 16, image 35) in the posterior left lobe of the liver, hepatic segment II/III (series 16, image 41). Unchanged arterially hyperenhancing lesion of the inferior margin of hepatic segment III, measuring 0.9 cm  (series 14, image 63). No evidence of washout or capsular enhancement. Status post cholecystectomy. No biliary dilatation.  Pancreas: Unremarkable. No pancreatic ductal dilatation or surrounding inflammatory changes.  Spleen: Normal in size without significant abnormality.  Adrenals/Urinary  Tract: Adrenal glands are unremarkable. Simple, benign bilateral renal cortical cysts, for which no further follow-up or characterization is required. Kidneys are otherwise normal, without obvious renal calculi, solid lesion, or hydronephrosis.  Stomach/Bowel: Stomach is within normal limits. No evidence of bowel wall thickening, distention, or inflammatory changes.  Vascular/Lymphatic: Aortic atherosclerosis. Recanalization of the umbilical vein with large ventral abdominal varices (series 14, image 68). Moderate sized gastroesophageal varices (series 14, image 23). No enlarged abdominal lymph nodes.  Other: No abdominal wall hernia or abnormality. No ascites.  Musculoskeletal: No acute or significant osseous findings.  IMPRESSION: 1. Unchanged, nonenhancing ablation defects of the anterior right lobe of the liver, hepatic segment VIII, and posterior left lobe of the liver, hepatic segment II/III. No evidence of residual or recurrent tumor at these sites. LI-RADS TR, nonviable. 2. Unchanged arterially hyperenhancing lesion of the inferior margin of hepatic segment III, measuring 0.9 cm. No evidence of washout or capsular enhancement. LI-RADS category 3. 3. Cirrhosis and portal hypertension. Moderate gastroesophageal varices.  Aortic Atherosclerosis (ICD10-I70.0).  Electronically Signed   By: Marolyn JONETTA Jaksch M.D.   On: 09/03/2024 10:50

## 2024-12-19 ENCOUNTER — Encounter: Payer: Self-pay | Admitting: *Deleted

## 2024-12-24 ENCOUNTER — Encounter (HOSPITAL_COMMUNITY): Payer: Self-pay

## 2024-12-26 ENCOUNTER — Ambulatory Visit (HOSPITAL_COMMUNITY): Admission: RE | Admit: 2024-12-26 | Source: Ambulatory Visit

## 2024-12-26 ENCOUNTER — Inpatient Hospital Stay: Attending: Oncology

## 2025-01-04 ENCOUNTER — Ambulatory Visit (HOSPITAL_COMMUNITY): Admission: RE | Admit: 2025-01-04 | Source: Ambulatory Visit

## 2025-01-04 ENCOUNTER — Inpatient Hospital Stay: Admitting: Oncology

## 2025-01-04 ENCOUNTER — Inpatient Hospital Stay

## 2025-01-06 ENCOUNTER — Ambulatory Visit (HOSPITAL_COMMUNITY): Admission: RE | Admit: 2025-01-06 | Source: Ambulatory Visit

## 2025-01-10 ENCOUNTER — Ambulatory Visit (HOSPITAL_COMMUNITY)
Admission: RE | Admit: 2025-01-10 | Discharge: 2025-01-10 | Disposition: A | Discharge status: Home / Self Care | Source: Ambulatory Visit | Attending: Oncology | Admitting: Oncology

## 2025-01-10 DIAGNOSIS — C220 Liver cell carcinoma: Secondary | ICD-10-CM | POA: Insufficient documentation

## 2025-01-10 MED ORDER — GADOBUTROL 1 MMOL/ML IV SOLN
8.0000 mL | Freq: Once | INTRAVENOUS | Status: AC | PRN
  Administered 2025-01-10: 8 mL via INTRAVENOUS

## 2025-01-22 ENCOUNTER — Encounter: Payer: Self-pay | Admitting: *Deleted

## 2025-01-23 ENCOUNTER — Ambulatory Visit (HOSPITAL_COMMUNITY)

## 2025-01-23 ENCOUNTER — Inpatient Hospital Stay

## 2025-02-01 ENCOUNTER — Inpatient Hospital Stay: Attending: Oncology | Admitting: Oncology
